# Patient Record
Sex: Male | Born: 1940 | Race: White | Hispanic: No | Marital: Married | Smoking: Former smoker
Health system: Southern US, Community
[De-identification: ages and names within clinical notes are randomized; demographics above are authoritative.]

## PROBLEM LIST (undated history)

## (undated) DIAGNOSIS — D801 Nonfamilial hypogammaglobulinemia: Secondary | ICD-10-CM

## (undated) DIAGNOSIS — Z7189 Other specified counseling: Secondary | ICD-10-CM

## (undated) DIAGNOSIS — C9000 Multiple myeloma not having achieved remission: Secondary | ICD-10-CM

## (undated) DIAGNOSIS — E109 Type 1 diabetes mellitus without complications: Secondary | ICD-10-CM

## (undated) HISTORY — DX: Multiple myeloma not having achieved remission: C90.00

## (undated) HISTORY — DX: Type 1 diabetes mellitus without complications: E10.9

---
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
## 1997-12-28 ENCOUNTER — Encounter: Admission: RE | Admit: 1997-12-28 | Discharge: 1998-03-28 | Payer: Self-pay | Admitting: Internal Medicine

## 1998-05-27 HISTORY — PX: APPENDECTOMY: SHX54

## 2004-07-10 ENCOUNTER — Ambulatory Visit: Payer: Self-pay | Admitting: Gastroenterology

## 2004-08-07 ENCOUNTER — Ambulatory Visit: Payer: Self-pay | Admitting: Gastroenterology

## 2008-06-01 ENCOUNTER — Ambulatory Visit: Payer: Self-pay | Admitting: Hematology & Oncology

## 2008-06-15 ENCOUNTER — Ambulatory Visit (HOSPITAL_BASED_OUTPATIENT_CLINIC_OR_DEPARTMENT_OTHER): Admission: RE | Admit: 2008-06-15 | Discharge: 2008-06-15 | Payer: Self-pay | Admitting: Hematology & Oncology

## 2008-06-15 ENCOUNTER — Ambulatory Visit: Payer: Self-pay | Admitting: Diagnostic Radiology

## 2008-06-23 LAB — UIFE/LIGHT CHAINS/TP QN, 24-HR UR
Alpha 2, Urine: DETECTED — AB
Free Kappa Lt Chains,Ur: 391 mg/dL — ABNORMAL HIGH (ref 0.04–1.51)
Total Protein, Urine: 395.4 mg/dL

## 2008-07-06 ENCOUNTER — Ambulatory Visit: Payer: Self-pay | Admitting: Hematology & Oncology

## 2008-07-06 ENCOUNTER — Ambulatory Visit (HOSPITAL_COMMUNITY): Admission: RE | Admit: 2008-07-06 | Discharge: 2008-07-06 | Payer: Self-pay | Admitting: Hematology & Oncology

## 2008-07-06 ENCOUNTER — Encounter: Payer: Self-pay | Admitting: Hematology & Oncology

## 2008-07-27 ENCOUNTER — Ambulatory Visit: Payer: Self-pay | Admitting: Hematology & Oncology

## 2008-07-28 LAB — BASIC METABOLIC PANEL
CO2: 24 mEq/L (ref 19–32)
Calcium: 7.9 mg/dL — ABNORMAL LOW (ref 8.4–10.5)
Chloride: 103 mEq/L (ref 96–112)
Potassium: 4.5 mEq/L (ref 3.5–5.3)
Sodium: 137 mEq/L (ref 135–145)

## 2008-07-28 LAB — CBC WITH DIFFERENTIAL (CANCER CENTER ONLY)
BASO#: 0.1 10*3/uL (ref 0.0–0.2)
Eosinophils Absolute: 0.6 10*3/uL — ABNORMAL HIGH (ref 0.0–0.5)
HCT: 33.2 % — ABNORMAL LOW (ref 38.7–49.9)
HGB: 11.2 g/dL — ABNORMAL LOW (ref 13.0–17.1)
LYMPH%: 15.4 % (ref 14.0–48.0)
MCH: 32.1 pg (ref 28.0–33.4)
MCV: 95 fL (ref 82–98)
MONO%: 12.4 % (ref 0.0–13.0)
NEUT%: 65.1 % (ref 40.0–80.0)
RBC: 3.49 10*6/uL — ABNORMAL LOW (ref 4.20–5.70)

## 2008-08-09 LAB — COMPREHENSIVE METABOLIC PANEL
ALT: 23 U/L (ref 0–53)
BUN: 20 mg/dL (ref 6–23)
CO2: 26 mEq/L (ref 19–32)
Calcium: 8.6 mg/dL (ref 8.4–10.5)
Chloride: 105 mEq/L (ref 96–112)
Creatinine, Ser: 1.19 mg/dL (ref 0.40–1.50)
Glucose, Bld: 94 mg/dL (ref 70–99)

## 2008-08-09 LAB — LACTATE DEHYDROGENASE: LDH: 139 U/L (ref 94–250)

## 2008-08-09 LAB — CBC WITH DIFFERENTIAL (CANCER CENTER ONLY)
BASO%: 0.8 % (ref 0.0–2.0)
HCT: 35.4 % — ABNORMAL LOW (ref 38.7–49.9)
LYMPH%: 26.9 % (ref 14.0–48.0)
MCH: 32.8 pg (ref 28.0–33.4)
MCV: 97 fL (ref 82–98)
MONO%: 12 % (ref 0.0–13.0)
NEUT%: 55.3 % (ref 40.0–80.0)
Platelets: 265 10*3/uL (ref 145–400)
RDW: 11.5 % (ref 10.5–14.6)

## 2008-08-16 LAB — CBC WITH DIFFERENTIAL (CANCER CENTER ONLY)
BASO%: 0.5 % (ref 0.0–2.0)
EOS%: 4.1 % (ref 0.0–7.0)
LYMPH%: 12 % — ABNORMAL LOW (ref 14.0–48.0)
MCHC: 33.6 g/dL (ref 32.0–35.9)
MCV: 97 fL (ref 82–98)
MONO#: 0.5 10*3/uL (ref 0.1–0.9)
NEUT%: 78.1 % (ref 40.0–80.0)
Platelets: 276 10*3/uL (ref 145–400)
RDW: 12.2 % (ref 10.5–14.6)
WBC: 9.2 10*3/uL (ref 4.0–10.0)

## 2008-08-23 LAB — CBC WITH DIFFERENTIAL (CANCER CENTER ONLY)
BASO%: 0.7 % (ref 0.0–2.0)
HCT: 37 % — ABNORMAL LOW (ref 38.7–49.9)
LYMPH#: 1.4 10*3/uL (ref 0.9–3.3)
MONO#: 1.4 10*3/uL — ABNORMAL HIGH (ref 0.1–0.9)
NEUT%: 71 % (ref 40.0–80.0)
Platelets: 175 10*3/uL (ref 145–400)
RDW: 11.9 % (ref 10.5–14.6)
WBC: 11.6 10*3/uL — ABNORMAL HIGH (ref 4.0–10.0)

## 2008-08-30 LAB — CBC WITH DIFFERENTIAL (CANCER CENTER ONLY)
BASO#: 0.1 10*3/uL (ref 0.0–0.2)
BASO%: 0.8 % (ref 0.0–2.0)
HCT: 36.6 % — ABNORMAL LOW (ref 38.7–49.9)
HGB: 12.2 g/dL — ABNORMAL LOW (ref 13.0–17.1)
LYMPH#: 1.3 10*3/uL (ref 0.9–3.3)
LYMPH%: 22.4 % (ref 14.0–48.0)
MCV: 97 fL (ref 82–98)
MONO#: 0.6 10*3/uL (ref 0.1–0.9)
NEUT%: 54.2 % (ref 40.0–80.0)
RDW: 12.4 % (ref 10.5–14.6)
WBC: 5.9 10*3/uL (ref 4.0–10.0)

## 2008-09-06 LAB — CBC WITH DIFFERENTIAL (CANCER CENTER ONLY)
BASO#: 0 10*3/uL (ref 0.0–0.2)
Eosinophils Absolute: 0.2 10*3/uL (ref 0.0–0.5)
HGB: 12.6 g/dL — ABNORMAL LOW (ref 13.0–17.1)
LYMPH#: 1.1 10*3/uL (ref 0.9–3.3)
MCH: 32 pg (ref 28.0–33.4)
MONO#: 0.5 10*3/uL (ref 0.1–0.9)
MONO%: 10.8 % (ref 0.0–13.0)
NEUT#: 2.6 10*3/uL (ref 1.5–6.5)
RBC: 3.93 10*6/uL — ABNORMAL LOW (ref 4.20–5.70)

## 2008-09-06 LAB — CMP (CANCER CENTER ONLY)
ALT(SGPT): 27 U/L (ref 10–47)
Albumin: 3.5 g/dL (ref 3.3–5.5)
CO2: 28 mEq/L (ref 18–33)
Chloride: 101 mEq/L (ref 98–108)
Glucose, Bld: 150 mg/dL — ABNORMAL HIGH (ref 73–118)
Potassium: 4.8 mEq/L — ABNORMAL HIGH (ref 3.3–4.7)
Sodium: 137 mEq/L (ref 128–145)
Total Protein: 5.5 g/dL — ABNORMAL LOW (ref 6.4–8.1)

## 2008-09-08 LAB — KAPPA/LAMBDA LIGHT CHAINS
Kappa free light chain: 11.9 mg/dL — ABNORMAL HIGH (ref 0.33–1.94)
Lambda Free Lght Chn: 0.64 mg/dL (ref 0.57–2.63)

## 2008-09-08 LAB — PROTEIN ELECTROPHORESIS, SERUM
Beta 2: 3.8 % (ref 3.2–6.5)
Beta Globulin: 6.3 % (ref 4.7–7.2)
Gamma Globulin: 6.1 % — ABNORMAL LOW (ref 11.1–18.8)

## 2008-09-08 LAB — BETA 2 MICROGLOBULIN, SERUM: Beta-2 Microglobulin: 1.73 mg/L (ref 1.01–1.73)

## 2008-09-12 ENCOUNTER — Ambulatory Visit: Payer: Self-pay | Admitting: Hematology & Oncology

## 2008-09-13 LAB — CBC WITH DIFFERENTIAL (CANCER CENTER ONLY)
BASO#: 0 10*3/uL (ref 0.0–0.2)
EOS%: 5.5 % (ref 0.0–7.0)
HGB: 13.2 g/dL (ref 13.0–17.1)
LYMPH#: 1.3 10*3/uL (ref 0.9–3.3)
MCHC: 33.8 g/dL (ref 32.0–35.9)
NEUT#: 4.6 10*3/uL (ref 1.5–6.5)
RBC: 4.05 10*6/uL — ABNORMAL LOW (ref 4.20–5.70)

## 2008-09-20 LAB — CBC WITH DIFFERENTIAL (CANCER CENTER ONLY)
BASO#: 0.1 10*3/uL (ref 0.0–0.2)
BASO%: 0.6 % (ref 0.0–2.0)
EOS%: 4.7 % (ref 0.0–7.0)
HCT: 36.1 % — ABNORMAL LOW (ref 38.7–49.9)
HGB: 12.4 g/dL — ABNORMAL LOW (ref 13.0–17.1)
LYMPH#: 1.1 10*3/uL (ref 0.9–3.3)
LYMPH%: 12 % — ABNORMAL LOW (ref 14.0–48.0)
MCH: 33 pg (ref 28.0–33.4)
MCHC: 34.5 g/dL (ref 32.0–35.9)
MCV: 96 fL (ref 82–98)
MONO%: 12.4 % (ref 0.0–13.0)
NEUT%: 70.3 % (ref 40.0–80.0)
RDW: 11.7 % (ref 10.5–14.6)

## 2008-10-05 LAB — CBC WITH DIFFERENTIAL (CANCER CENTER ONLY)
Eosinophils Absolute: 0.1 10*3/uL (ref 0.0–0.5)
HCT: 40.3 % (ref 38.7–49.9)
LYMPH#: 1.8 10*3/uL (ref 0.9–3.3)
LYMPH%: 28.1 % (ref 14.0–48.0)
MCV: 96 fL (ref 82–98)
MONO#: 0.7 10*3/uL (ref 0.1–0.9)
NEUT%: 58.4 % (ref 40.0–80.0)
Platelets: 230 10*3/uL (ref 145–400)
RBC: 4.21 10*6/uL (ref 4.20–5.70)
WBC: 6.3 10*3/uL (ref 4.0–10.0)

## 2008-10-05 LAB — CHCC SATELLITE - SMEAR

## 2008-10-07 LAB — COMPREHENSIVE METABOLIC PANEL
BUN: 22 mg/dL (ref 6–23)
CO2: 24 mEq/L (ref 19–32)
Calcium: 8.1 mg/dL — ABNORMAL LOW (ref 8.4–10.5)
Chloride: 106 mEq/L (ref 96–112)
Creatinine, Ser: 0.94 mg/dL (ref 0.40–1.50)
Glucose, Bld: 124 mg/dL — ABNORMAL HIGH (ref 70–99)

## 2008-10-07 LAB — LACTATE DEHYDROGENASE: LDH: 138 U/L (ref 94–250)

## 2008-10-07 LAB — PROTEIN ELECTROPHORESIS, SERUM
Alpha-2-Globulin: 11.3 % (ref 7.1–11.8)
Gamma Globulin: 5.7 % — ABNORMAL LOW (ref 11.1–18.8)

## 2008-10-07 LAB — KAPPA/LAMBDA LIGHT CHAINS
Kappa free light chain: 5.32 mg/dL — ABNORMAL HIGH (ref 0.33–1.94)
Kappa:Lambda Ratio: 6.65 — ABNORMAL HIGH (ref 0.26–1.65)
Lambda Free Lght Chn: 0.8 mg/dL (ref 0.57–2.63)

## 2008-11-21 ENCOUNTER — Ambulatory Visit: Payer: Self-pay | Admitting: Hematology & Oncology

## 2008-11-22 ENCOUNTER — Encounter: Payer: Self-pay | Admitting: Hematology & Oncology

## 2008-11-22 ENCOUNTER — Ambulatory Visit (HOSPITAL_COMMUNITY): Admission: RE | Admit: 2008-11-22 | Discharge: 2008-11-22 | Payer: Self-pay | Admitting: Hematology & Oncology

## 2008-11-30 ENCOUNTER — Ambulatory Visit: Payer: Self-pay | Admitting: Hematology & Oncology

## 2008-12-01 LAB — CBC WITH DIFFERENTIAL (CANCER CENTER ONLY)
BASO%: 0.6 % (ref 0.0–2.0)
EOS%: 1.7 % (ref 0.0–7.0)
Eosinophils Absolute: 0.1 10*3/uL (ref 0.0–0.5)
MCH: 31.7 pg (ref 28.0–33.4)
MCHC: 33 g/dL (ref 32.0–35.9)
MONO%: 9.7 % (ref 0.0–13.0)
NEUT#: 2.1 10*3/uL (ref 1.5–6.5)
Platelets: 224 10*3/uL (ref 145–400)
RBC: 4.53 10*6/uL (ref 4.20–5.70)
RDW: 13.5 % (ref 10.5–14.6)

## 2008-12-05 LAB — SPEP & IFE WITH QIG
Albumin ELP: 65.6 % (ref 55.8–66.1)
Alpha-1-Globulin: 7.1 % — ABNORMAL HIGH (ref 2.9–4.9)
Alpha-2-Globulin: 11.1 % (ref 7.1–11.8)
IgM, Serum: 15 mg/dL — ABNORMAL LOW (ref 60–263)

## 2008-12-05 LAB — COMPREHENSIVE METABOLIC PANEL
AST: 15 U/L (ref 0–37)
Albumin: 4.1 g/dL (ref 3.5–5.2)
Alkaline Phosphatase: 66 U/L (ref 39–117)
Potassium: 4.2 mEq/L (ref 3.5–5.3)
Sodium: 140 mEq/L (ref 135–145)
Total Protein: 5.9 g/dL — ABNORMAL LOW (ref 6.0–8.3)

## 2008-12-08 LAB — BASIC METABOLIC PANEL - CANCER CENTER ONLY
CO2: 31 mEq/L (ref 18–33)
Chloride: 103 mEq/L (ref 98–108)
Glucose, Bld: 119 mg/dL — ABNORMAL HIGH (ref 73–118)
Sodium: 143 mEq/L (ref 128–145)

## 2008-12-25 HISTORY — PX: LIMBAL STEM CELL TRANSPLANT: SHX1969

## 2009-01-04 ENCOUNTER — Ambulatory Visit: Payer: Self-pay | Admitting: Hematology & Oncology

## 2009-01-16 LAB — CBC WITH DIFFERENTIAL (CANCER CENTER ONLY)
BASO#: 0 10e3/uL (ref 0.0–0.2)
BASO%: 0.8 % (ref 0.0–2.0)
EOS%: 0.8 % (ref 0.0–7.0)
Eosinophils Absolute: 0 10e3/uL (ref 0.0–0.5)
HCT: 35.4 % — ABNORMAL LOW (ref 38.7–49.9)
HGB: 12.1 g/dL — ABNORMAL LOW (ref 13.0–17.1)
LYMPH#: 1.2 10e3/uL (ref 0.9–3.3)
LYMPH%: 23.6 % (ref 14.0–48.0)
MCH: 32.9 pg (ref 28.0–33.4)
MCHC: 34.2 g/dL (ref 32.0–35.9)
MCV: 96 fL (ref 82–98)
MONO#: 1 10e3/uL — ABNORMAL HIGH (ref 0.1–0.9)
MONO%: 18.9 % — ABNORMAL HIGH (ref 0.0–13.0)
NEUT#: 2.9 10e3/uL (ref 1.5–6.5)
NEUT%: 55.9 % (ref 40.0–80.0)
Platelets: 216 10e3/uL (ref 145–400)
RBC: 3.67 10e6/uL — ABNORMAL LOW (ref 4.20–5.70)
RDW: 13.7 % (ref 10.5–14.6)
WBC: 5.2 10e3/uL (ref 4.0–10.0)

## 2009-01-18 LAB — PROTEIN ELECTROPHORESIS, SERUM
Albumin ELP: 59.3 % (ref 55.8–66.1)
Alpha-1-Globulin: 10.7 % — ABNORMAL HIGH (ref 2.9–4.9)
Alpha-2-Globulin: 13.2 % — ABNORMAL HIGH (ref 7.1–11.8)
Beta 2: 3.8 % (ref 3.2–6.5)
Beta Globulin: 6.1 % (ref 4.7–7.2)
Gamma Globulin: 6.9 % — ABNORMAL LOW (ref 11.1–18.8)

## 2009-01-18 LAB — CMP AND LIVER
Albumin: 3.8 g/dL (ref 3.5–5.2)
BUN: 15 mg/dL (ref 6–23)
CO2: 25 mEq/L (ref 19–32)
Calcium: 8.5 mg/dL (ref 8.4–10.5)
Chloride: 104 mEq/L (ref 96–112)
Glucose, Bld: 105 mg/dL — ABNORMAL HIGH (ref 70–99)
Indirect Bilirubin: 0.4 mg/dL (ref 0.0–0.9)
Potassium: 4.3 mEq/L (ref 3.5–5.3)
Sodium: 138 mEq/L (ref 135–145)
Total Protein: 5.7 g/dL — ABNORMAL LOW (ref 6.0–8.3)

## 2009-01-18 LAB — KAPPA/LAMBDA LIGHT CHAINS: Lambda Free Lght Chn: 0.79 mg/dL (ref 0.57–2.63)

## 2009-01-23 LAB — CBC WITH DIFFERENTIAL (CANCER CENTER ONLY)
BASO%: 0.8 % (ref 0.0–2.0)
Eosinophils Absolute: 0.1 10*3/uL (ref 0.0–0.5)
MONO#: 0.6 10*3/uL (ref 0.1–0.9)
NEUT#: 2.7 10*3/uL (ref 1.5–6.5)
Platelets: 282 10*3/uL (ref 145–400)
RBC: 3.99 10*6/uL — ABNORMAL LOW (ref 4.20–5.70)
WBC: 4.6 10*3/uL (ref 4.0–10.0)

## 2009-01-23 LAB — CMP AND LIVER
BUN: 18 mg/dL (ref 6–23)
Bilirubin, Direct: 0.1 mg/dL (ref 0.0–0.3)
CO2: 24 mEq/L (ref 19–32)
Calcium: 8.6 mg/dL (ref 8.4–10.5)
Chloride: 105 mEq/L (ref 96–112)
Creatinine, Ser: 0.94 mg/dL (ref 0.40–1.50)
Glucose, Bld: 186 mg/dL — ABNORMAL HIGH (ref 70–99)
Indirect Bilirubin: 0.3 mg/dL (ref 0.0–0.9)

## 2009-01-23 LAB — PHOSPHORUS: Phosphorus: 3.2 mg/dL (ref 2.3–4.6)

## 2009-01-31 LAB — CBC WITH DIFFERENTIAL (CANCER CENTER ONLY)
BASO%: 1.3 % (ref 0.0–2.0)
EOS%: 4 % (ref 0.0–7.0)
HCT: 44.7 % (ref 38.7–49.9)
LYMPH#: 1.4 10*3/uL (ref 0.9–3.3)
LYMPH%: 18.4 % (ref 14.0–48.0)
MCHC: 33.5 g/dL (ref 32.0–35.9)
MCV: 100 fL — ABNORMAL HIGH (ref 82–98)
NEUT%: 66.6 % (ref 40.0–80.0)
Platelets: 179 10*3/uL (ref 145–400)
RDW: 14.4 % (ref 10.5–14.6)

## 2009-01-31 LAB — COMPREHENSIVE METABOLIC PANEL
Albumin: 3.9 g/dL (ref 3.5–5.2)
Alkaline Phosphatase: 63 U/L (ref 39–117)
BUN: 18 mg/dL (ref 6–23)
Glucose, Bld: 175 mg/dL — ABNORMAL HIGH (ref 70–99)
Total Bilirubin: 0.6 mg/dL (ref 0.3–1.2)

## 2009-02-13 ENCOUNTER — Ambulatory Visit: Payer: Self-pay | Admitting: Hematology & Oncology

## 2009-02-14 LAB — CBC WITH DIFFERENTIAL (CANCER CENTER ONLY)
BASO%: 0.5 % (ref 0.0–2.0)
EOS%: 3.7 % (ref 0.0–7.0)
LYMPH#: 1.3 10*3/uL (ref 0.9–3.3)
MCHC: 33.9 g/dL (ref 32.0–35.9)
MONO#: 0.5 10*3/uL (ref 0.1–0.9)
NEUT#: 3.4 10*3/uL (ref 1.5–6.5)
RDW: 13.1 % (ref 10.5–14.6)
WBC: 5.4 10*3/uL (ref 4.0–10.0)

## 2009-02-14 LAB — COMPREHENSIVE METABOLIC PANEL
AST: 19 U/L (ref 0–37)
Albumin: 3.9 g/dL (ref 3.5–5.2)
BUN: 11 mg/dL (ref 6–23)
Calcium: 8.8 mg/dL (ref 8.4–10.5)
Chloride: 109 mEq/L (ref 96–112)
Glucose, Bld: 87 mg/dL (ref 70–99)
Potassium: 4.2 mEq/L (ref 3.5–5.3)

## 2009-02-28 LAB — CBC WITH DIFFERENTIAL (CANCER CENTER ONLY)
BASO#: 0.1 10*3/uL (ref 0.0–0.2)
Eosinophils Absolute: 0 10*3/uL (ref 0.0–0.5)
HCT: 46.1 % (ref 38.7–49.9)
HGB: 15.7 g/dL (ref 13.0–17.1)
LYMPH%: 31 % (ref 14.0–48.0)
MCH: 33.3 pg (ref 28.0–33.4)
MCV: 98 fL (ref 82–98)
MONO#: 0.4 10*3/uL (ref 0.1–0.9)
MONO%: 10.7 % (ref 0.0–13.0)
NEUT%: 55.8 % (ref 40.0–80.0)
Platelets: 152 10*3/uL (ref 145–400)
RBC: 4.71 10*6/uL (ref 4.20–5.70)
WBC: 3.8 10*3/uL — ABNORMAL LOW (ref 4.0–10.0)

## 2009-03-02 LAB — COMPREHENSIVE METABOLIC PANEL
ALT: 19 U/L (ref 0–53)
Albumin: 4 g/dL (ref 3.5–5.2)
CO2: 28 mEq/L (ref 19–32)
Calcium: 8.7 mg/dL (ref 8.4–10.5)
Chloride: 107 mEq/L (ref 96–112)
Glucose, Bld: 183 mg/dL — ABNORMAL HIGH (ref 70–99)
Potassium: 4.5 mEq/L (ref 3.5–5.3)
Sodium: 142 mEq/L (ref 135–145)
Total Protein: 5.9 g/dL — ABNORMAL LOW (ref 6.0–8.3)

## 2009-03-02 LAB — KAPPA/LAMBDA LIGHT CHAINS
Kappa free light chain: <0.57 mg/dL (ref 0.33–1.94)
Lambda Free Lght Chn: <0.47 mg/dL — ABNORMAL LOW (ref 0.57–2.63)

## 2009-03-02 LAB — PROTEIN ELECTROPHORESIS, SERUM
Alpha-2-Globulin: 10.2 % (ref 7.1–11.8)
Beta 2: 4.7 % (ref 3.2–6.5)
Beta Globulin: 6.6 % (ref 4.7–7.2)
Gamma Globulin: 9 % — ABNORMAL LOW (ref 11.1–18.8)
Total Protein, Serum Electrophoresis: 5.9 g/dL — ABNORMAL LOW (ref 6.0–8.3)

## 2009-03-02 LAB — IGG, IGA, IGM
IgA: 51 mg/dL — ABNORMAL LOW (ref 68–378)
IgG (Immunoglobin G), Serum: 552 mg/dL — ABNORMAL LOW (ref 694–1618)

## 2009-03-14 LAB — CBC WITH DIFFERENTIAL (CANCER CENTER ONLY)
BASO%: 1 % (ref 0.0–2.0)
HCT: 45.4 % (ref 38.7–49.9)
LYMPH%: 17.8 % (ref 14.0–48.0)
MCH: 33.5 pg — ABNORMAL HIGH (ref 28.0–33.4)
MCV: 97 fL (ref 82–98)
MONO#: 0.6 10*3/uL (ref 0.1–0.9)
MONO%: 8.8 % (ref 0.0–13.0)
NEUT%: 70.1 % (ref 40.0–80.0)
Platelets: 179 10*3/uL (ref 145–400)
RDW: 11.6 % (ref 10.5–14.6)
WBC: 7.2 10*3/uL (ref 4.0–10.0)

## 2009-03-14 LAB — COMPREHENSIVE METABOLIC PANEL
AST: 14 U/L (ref 0–37)
Albumin: 3.9 g/dL (ref 3.5–5.2)
Alkaline Phosphatase: 69 U/L (ref 39–117)
Potassium: 4 mEq/L (ref 3.5–5.3)
Sodium: 137 mEq/L (ref 135–145)
Total Protein: 6 g/dL (ref 6.0–8.3)

## 2009-04-07 ENCOUNTER — Ambulatory Visit: Payer: Self-pay | Admitting: Hematology & Oncology

## 2009-04-10 LAB — CBC WITH DIFFERENTIAL (CANCER CENTER ONLY)
BASO#: 0.1 10*3/uL (ref 0.0–0.2)
Eosinophils Absolute: 0.4 10*3/uL (ref 0.0–0.5)
HGB: 15 g/dL (ref 13.0–17.1)
MCH: 33.1 pg (ref 28.0–33.4)
MONO#: 0.6 10*3/uL (ref 0.1–0.9)
MONO%: 10.5 % (ref 0.0–13.0)
NEUT#: 3.4 10*3/uL (ref 1.5–6.5)
Platelets: 181 10*3/uL (ref 145–400)
RBC: 4.52 10*6/uL (ref 4.20–5.70)
WBC: 5.5 10*3/uL (ref 4.0–10.0)

## 2009-04-11 ENCOUNTER — Encounter: Payer: Self-pay | Admitting: Hematology & Oncology

## 2009-04-11 ENCOUNTER — Ambulatory Visit: Payer: Self-pay | Admitting: Hematology & Oncology

## 2009-04-11 ENCOUNTER — Ambulatory Visit (HOSPITAL_COMMUNITY): Admission: RE | Admit: 2009-04-11 | Discharge: 2009-04-11 | Payer: Self-pay | Admitting: Hematology & Oncology

## 2009-04-12 LAB — COMPREHENSIVE METABOLIC PANEL
Albumin: 3.8 g/dL (ref 3.5–5.2)
Alkaline Phosphatase: 69 U/L (ref 39–117)
BUN: 16 mg/dL (ref 6–23)
CO2: 26 mEq/L (ref 19–32)
Glucose, Bld: 141 mg/dL — ABNORMAL HIGH (ref 70–99)
Potassium: 4.2 mEq/L (ref 3.5–5.3)
Sodium: 139 mEq/L (ref 135–145)
Total Bilirubin: 0.7 mg/dL (ref 0.3–1.2)
Total Protein: 5.8 g/dL — ABNORMAL LOW (ref 6.0–8.3)

## 2009-04-12 LAB — SPEP & IFE WITH QIG
Albumin ELP: 59.8 % (ref 55.8–66.1)
Alpha-1-Globulin: 6.4 % — ABNORMAL HIGH (ref 2.9–4.9)
Alpha-2-Globulin: 11.1 % (ref 7.1–11.8)
IgM, Serum: 28 mg/dL — ABNORMAL LOW (ref 60–263)

## 2009-04-12 LAB — KAPPA/LAMBDA LIGHT CHAINS
Kappa free light chain: 1.43 mg/dL (ref 0.33–1.94)
Kappa:Lambda Ratio: 1.27 (ref 0.26–1.65)
Lambda Free Lght Chn: 1.13 mg/dL (ref 0.57–2.63)

## 2009-04-12 LAB — BETA 2 MICROGLOBULIN, SERUM: Beta-2 Microglobulin: 2.04 mg/L — ABNORMAL HIGH (ref 1.01–1.73)

## 2009-06-06 ENCOUNTER — Ambulatory Visit: Payer: Self-pay | Admitting: Hematology & Oncology

## 2009-06-08 LAB — CBC WITH DIFFERENTIAL (CANCER CENTER ONLY)
BASO#: 0 10*3/uL (ref 0.0–0.2)
BASO%: 0.7 % (ref 0.0–2.0)
EOS%: 4.8 % (ref 0.0–7.0)
Eosinophils Absolute: 0.2 10*3/uL (ref 0.0–0.5)
HCT: 44.3 % (ref 38.7–49.9)
HGB: 15 g/dL (ref 13.0–17.1)
LYMPH#: 1 10*3/uL (ref 0.9–3.3)
LYMPH%: 22.7 % (ref 14.0–48.0)
MCH: 32.7 pg (ref 28.0–33.4)
MCHC: 33.8 g/dL (ref 32.0–35.9)
MCV: 97 fL (ref 82–98)
MONO#: 0.4 10*3/uL (ref 0.1–0.9)
MONO%: 8.5 % (ref 0.0–13.0)
NEUT#: 2.7 10*3/uL (ref 1.5–6.5)
NEUT%: 63.3 % (ref 40.0–80.0)
Platelets: 292 10*3/uL (ref 145–400)
RBC: 4.58 10*6/uL (ref 4.20–5.70)
RDW: 12.7 % (ref 10.5–14.6)
WBC: 4.2 10*3/uL (ref 4.0–10.0)

## 2009-06-13 LAB — SPEP & IFE WITH QIG
Albumin ELP: 54 % — ABNORMAL LOW (ref 55.8–66.1)
Alpha-1-Globulin: 6.4 % — ABNORMAL HIGH (ref 2.9–4.9)
IgA: 207 mg/dL (ref 68–378)
IgM, Serum: 34 mg/dL — ABNORMAL LOW (ref 60–263)
Total Protein, Serum Electrophoresis: 6 g/dL (ref 6.0–8.3)

## 2009-06-13 LAB — COMPREHENSIVE METABOLIC PANEL
ALT: 27 U/L (ref 0–53)
AST: 21 U/L (ref 0–37)
Albumin: 3.6 g/dL (ref 3.5–5.2)
Alkaline Phosphatase: 65 U/L (ref 39–117)
BUN: 15 mg/dL (ref 6–23)
CO2: 27 mEq/L (ref 19–32)
Calcium: 8.5 mg/dL (ref 8.4–10.5)
Chloride: 102 mEq/L (ref 96–112)
Creatinine, Ser: 0.94 mg/dL (ref 0.40–1.50)
Glucose, Bld: 135 mg/dL — ABNORMAL HIGH (ref 70–99)
Potassium: 4 mEq/L (ref 3.5–5.3)
Sodium: 138 mEq/L (ref 135–145)
Total Bilirubin: 0.5 mg/dL (ref 0.3–1.2)
Total Protein: 6 g/dL (ref 6.0–8.3)

## 2009-06-13 LAB — KAPPA/LAMBDA LIGHT CHAINS: Kappa:Lambda Ratio: 1.51 (ref 0.26–1.65)

## 2009-08-29 ENCOUNTER — Ambulatory Visit: Payer: Self-pay | Admitting: Hematology & Oncology

## 2009-08-31 LAB — CBC WITH DIFFERENTIAL (CANCER CENTER ONLY)
BASO#: 0.1 10*3/uL (ref 0.0–0.2)
Eosinophils Absolute: 0.1 10*3/uL (ref 0.0–0.5)
HCT: 50 % — ABNORMAL HIGH (ref 38.7–49.9)
HGB: 16.7 g/dL (ref 13.0–17.1)
LYMPH%: 28.6 % (ref 14.0–48.0)
MCH: 34.3 pg — ABNORMAL HIGH (ref 28.0–33.4)
MCV: 103 fL — ABNORMAL HIGH (ref 82–98)
MONO%: 7.5 % (ref 0.0–13.0)
NEUT#: 3.1 10*3/uL (ref 1.5–6.5)
NEUT%: 59.1 % (ref 40.0–80.0)
RBC: 4.86 10*6/uL (ref 4.20–5.70)

## 2009-09-04 LAB — PROTEIN ELECTROPHORESIS, SERUM
Albumin ELP: 59.7 % (ref 55.8–66.1)
Beta 2: 4.2 % (ref 3.2–6.5)
Gamma Globulin: 13.5 % (ref 11.1–18.8)

## 2009-09-04 LAB — COMPREHENSIVE METABOLIC PANEL
Albumin: 4 g/dL (ref 3.5–5.2)
Alkaline Phosphatase: 61 U/L (ref 39–117)
BUN: 19 mg/dL (ref 6–23)
Creatinine, Ser: 0.99 mg/dL (ref 0.40–1.50)
Glucose, Bld: 117 mg/dL — ABNORMAL HIGH (ref 70–99)
Potassium: 4.2 mEq/L (ref 3.5–5.3)

## 2009-09-04 LAB — KAPPA/LAMBDA LIGHT CHAINS
Kappa:Lambda Ratio: 0.97 (ref 0.26–1.65)
Lambda Free Lght Chn: 1.32 mg/dL (ref 0.57–2.63)

## 2009-12-01 ENCOUNTER — Ambulatory Visit: Payer: Self-pay | Admitting: Hematology & Oncology

## 2009-12-04 LAB — CBC WITH DIFFERENTIAL (CANCER CENTER ONLY)
BASO%: 0.7 % (ref 0.0–2.0)
EOS%: 5.1 % (ref 0.0–7.0)
HCT: 46.4 % (ref 38.7–49.9)
LYMPH%: 29.1 % (ref 14.0–48.0)
MCH: 35.4 pg — ABNORMAL HIGH (ref 28.0–33.4)
MCHC: 34.2 g/dL (ref 32.0–35.9)
MCV: 104 fL — ABNORMAL HIGH (ref 82–98)
MONO%: 11.8 % (ref 0.0–13.0)
NEUT%: 53.3 % (ref 40.0–80.0)
Platelets: 148 10*3/uL (ref 145–400)
RDW: 11.4 % (ref 10.5–14.6)

## 2009-12-06 LAB — LACTATE DEHYDROGENASE: LDH: 131 U/L (ref 94–250)

## 2009-12-06 LAB — SPEP & IFE WITH QIG
Alpha-1-Globulin: 5.5 % — ABNORMAL HIGH (ref 2.9–4.9)
Alpha-2-Globulin: 10.3 % (ref 7.1–11.8)
Beta 2: 5.1 % (ref 3.2–6.5)
Beta Globulin: 5.7 % (ref 4.7–7.2)
Gamma Globulin: 14.6 % (ref 11.1–18.8)

## 2009-12-06 LAB — COMPREHENSIVE METABOLIC PANEL
BUN: 20 mg/dL (ref 6–23)
CO2: 25 mEq/L (ref 19–32)
Glucose, Bld: 149 mg/dL — ABNORMAL HIGH (ref 70–99)
Sodium: 137 mEq/L (ref 135–145)
Total Bilirubin: 1 mg/dL (ref 0.3–1.2)
Total Protein: 6.4 g/dL (ref 6.0–8.3)

## 2010-03-15 ENCOUNTER — Ambulatory Visit: Payer: Self-pay | Admitting: Hematology & Oncology

## 2010-03-16 LAB — CBC WITH DIFFERENTIAL (CANCER CENTER ONLY)
BASO#: 0 10*3/uL (ref 0.0–0.2)
EOS%: 2.5 % (ref 0.0–7.0)
HCT: 46 % (ref 38.7–49.9)
HGB: 15.4 g/dL (ref 13.0–17.1)
LYMPH#: 1.1 10*3/uL (ref 0.9–3.3)
MCHC: 33.5 g/dL (ref 32.0–35.9)
MONO#: 0.4 10*3/uL (ref 0.1–0.9)
NEUT#: 1.9 10*3/uL (ref 1.5–6.5)
NEUT%: 53.3 % (ref 40.0–80.0)
RBC: 4.55 10*6/uL (ref 4.20–5.70)
WBC: 3.6 10*3/uL — ABNORMAL LOW (ref 4.0–10.0)

## 2010-03-20 LAB — COMPREHENSIVE METABOLIC PANEL
ALT: 18 U/L (ref 0–53)
AST: 19 U/L (ref 0–37)
Albumin: 3.7 g/dL (ref 3.5–5.2)
BUN: 9 mg/dL (ref 6–23)
CO2: 27 mEq/L (ref 19–32)
Calcium: 8.4 mg/dL (ref 8.4–10.5)
Chloride: 105 mEq/L (ref 96–112)
Creatinine, Ser: 0.84 mg/dL (ref 0.40–1.50)
Potassium: 3.9 mEq/L (ref 3.5–5.3)

## 2010-03-20 LAB — PROTEIN ELECTROPHORESIS, SERUM
Alpha-1-Globulin: 5.6 % — ABNORMAL HIGH (ref 2.9–4.9)
Alpha-2-Globulin: 9.6 % (ref 7.1–11.8)
Beta 2: 4.5 % (ref 3.2–6.5)
Gamma Globulin: 16.6 % (ref 11.1–18.8)

## 2010-03-20 LAB — KAPPA/LAMBDA LIGHT CHAINS: Kappa free light chain: 1.38 mg/dL (ref 0.33–1.94)

## 2010-06-13 ENCOUNTER — Ambulatory Visit: Payer: Self-pay | Admitting: Hematology & Oncology

## 2010-06-14 LAB — CBC WITH DIFFERENTIAL (CANCER CENTER ONLY)
BASO#: 0 10*3/uL (ref 0.0–0.2)
BASO%: 0.8 % (ref 0.0–2.0)
EOS%: 4.4 % (ref 0.0–7.0)
Eosinophils Absolute: 0.2 10*3/uL (ref 0.0–0.5)
HCT: 46 % (ref 38.7–49.9)
HGB: 15.4 g/dL (ref 13.0–17.1)
LYMPH#: 1.3 10*3/uL (ref 0.9–3.3)
LYMPH%: 28.8 % (ref 14.0–48.0)
MCH: 33.6 pg — ABNORMAL HIGH (ref 28.0–33.4)
MCHC: 33.4 g/dL (ref 32.0–35.9)
MCV: 101 fL — ABNORMAL HIGH (ref 82–98)
MONO#: 0.6 10*3/uL (ref 0.1–0.9)
MONO%: 13.3 % — ABNORMAL HIGH (ref 0.0–13.0)
NEUT#: 2.4 10*3/uL (ref 1.5–6.5)
NEUT%: 52.7 % (ref 40.0–80.0)
Platelets: 240 10*3/uL (ref 145–400)
RBC: 4.58 10*6/uL (ref 4.20–5.70)
RDW: 10.7 % (ref 10.5–14.6)
WBC: 4.5 10*3/uL (ref 4.0–10.0)

## 2010-06-14 LAB — CHCC SATELLITE - SMEAR

## 2010-06-18 LAB — SPEP & IFE WITH QIG
Albumin ELP: 54.3 % — ABNORMAL LOW (ref 55.8–66.1)
Alpha-1-Globulin: 6.4 % — ABNORMAL HIGH (ref 2.9–4.9)
Alpha-2-Globulin: 12.2 % — ABNORMAL HIGH (ref 7.1–11.8)
Beta 2: 5.8 % (ref 3.2–6.5)
Beta Globulin: 5.9 % (ref 4.7–7.2)
Gamma Globulin: 15.4 % (ref 11.1–18.8)
IgA: 184 mg/dL (ref 68–378)
IgG (Immunoglobin G), Serum: 1020 mg/dL (ref 694–1618)
IgM, Serum: 27 mg/dL — ABNORMAL LOW (ref 60–263)
Total Protein, Serum Electrophoresis: 6 g/dL (ref 6.0–8.3)

## 2010-06-18 LAB — COMPREHENSIVE METABOLIC PANEL
ALT: 21 U/L (ref 0–53)
AST: 16 U/L (ref 0–37)
Albumin: 3.7 g/dL (ref 3.5–5.2)
Alkaline Phosphatase: 69 U/L (ref 39–117)
BUN: 15 mg/dL (ref 6–23)
CO2: 28 mEq/L (ref 19–32)
Calcium: 8.5 mg/dL (ref 8.4–10.5)
Chloride: 98 mEq/L (ref 96–112)
Creatinine, Ser: 0.9 mg/dL (ref 0.40–1.50)
Glucose, Bld: 131 mg/dL — ABNORMAL HIGH (ref 70–99)
Potassium: 3.9 mEq/L (ref 3.5–5.3)
Sodium: 135 mEq/L (ref 135–145)
Total Bilirubin: 1 mg/dL (ref 0.3–1.2)
Total Protein: 6 g/dL (ref 6.0–8.3)

## 2010-06-18 LAB — BETA 2 MICROGLOBULIN, SERUM: Beta-2 Microglobulin: 1.93 mg/L — ABNORMAL HIGH (ref 1.01–1.73)

## 2010-06-18 LAB — KAPPA/LAMBDA LIGHT CHAINS
Kappa free light chain: 1.51 mg/dL (ref 0.33–1.94)
Kappa:Lambda Ratio: 1.59 (ref 0.26–1.65)
Lambda Free Lght Chn: 0.95 mg/dL (ref 0.57–2.63)

## 2010-06-18 LAB — LACTATE DEHYDROGENASE: LDH: 112 U/L (ref 94–250)

## 2010-07-25 ENCOUNTER — Other Ambulatory Visit: Payer: Self-pay | Admitting: Hematology & Oncology

## 2010-07-25 ENCOUNTER — Encounter (HOSPITAL_BASED_OUTPATIENT_CLINIC_OR_DEPARTMENT_OTHER): Payer: Medicare Other | Admitting: Hematology & Oncology

## 2010-07-25 DIAGNOSIS — C9000 Multiple myeloma not having achieved remission: Secondary | ICD-10-CM

## 2010-07-25 DIAGNOSIS — Z9484 Stem cells transplant status: Secondary | ICD-10-CM

## 2010-07-25 DIAGNOSIS — Z23 Encounter for immunization: Secondary | ICD-10-CM

## 2010-07-25 LAB — CBC WITH DIFFERENTIAL (CANCER CENTER ONLY)
BASO#: 0 10*3/uL (ref 0.0–0.2)
EOS%: 4 % (ref 0.0–7.0)
HCT: 44.9 % (ref 38.7–49.9)
HGB: 15.1 g/dL (ref 13.0–17.1)
MCH: 34.5 pg — ABNORMAL HIGH (ref 28.0–33.4)
MCHC: 33.7 g/dL (ref 32.0–35.9)
MONO%: 8.6 % (ref 0.0–13.0)
NEUT%: 44.9 % (ref 40.0–80.0)

## 2010-07-26 LAB — COMPREHENSIVE METABOLIC PANEL
Albumin: 3.6 g/dL (ref 3.5–5.2)
BUN: 14 mg/dL (ref 6–23)
CO2: 29 mEq/L (ref 19–32)
Calcium: 9.2 mg/dL (ref 8.4–10.5)
Chloride: 105 mEq/L (ref 96–112)
Glucose, Bld: 135 mg/dL — ABNORMAL HIGH (ref 70–99)
Potassium: 4 mEq/L (ref 3.5–5.3)
Sodium: 140 mEq/L (ref 135–145)
Total Protein: 5.9 g/dL — ABNORMAL LOW (ref 6.0–8.3)

## 2010-07-26 LAB — KAPPA/LAMBDA LIGHT CHAINS
Kappa free light chain: 1.45 mg/dL (ref 0.33–1.94)
Lambda Free Lght Chn: 1.41 mg/dL (ref 0.57–2.63)

## 2010-08-29 LAB — DIFFERENTIAL
Basophils Absolute: 0.1 10*3/uL (ref 0.0–0.1)
Basophils Relative: 1 % (ref 0–1)
Monocytes Absolute: 0.8 10*3/uL (ref 0.1–1.0)
Neutro Abs: 1.9 10*3/uL (ref 1.7–7.7)
Neutrophils Relative %: 46 % (ref 43–77)

## 2010-08-29 LAB — CBC
Hemoglobin: 13.6 g/dL (ref 13.0–17.0)
MCHC: 33.3 g/dL (ref 30.0–36.0)
Platelets: 155 10*3/uL (ref 150–400)
RDW: 14.9 % (ref 11.5–15.5)

## 2010-09-03 LAB — DIFFERENTIAL
Basophils Relative: 1 % (ref 0–1)
Eosinophils Absolute: 0.2 10*3/uL (ref 0.0–0.7)
Lymphs Abs: 1 10*3/uL (ref 0.7–4.0)
Monocytes Absolute: 0.5 10*3/uL (ref 0.1–1.0)
Monocytes Relative: 12 % (ref 3–12)
Neutro Abs: 2.7 10*3/uL (ref 1.7–7.7)

## 2010-09-03 LAB — CBC
Hemoglobin: 12.8 g/dL — ABNORMAL LOW (ref 13.0–17.0)
MCHC: 33.8 g/dL (ref 30.0–36.0)
MCV: 96.5 fL (ref 78.0–100.0)
RBC: 3.91 MIL/uL — ABNORMAL LOW (ref 4.22–5.81)
WBC: 4.4 10*3/uL (ref 4.0–10.5)

## 2010-09-03 LAB — CHROMOSOME ANALYSIS, BONE MARROW

## 2010-09-11 LAB — CBC
HCT: 37.5 % — ABNORMAL LOW (ref 39.0–52.0)
Hemoglobin: 12.8 g/dL — ABNORMAL LOW (ref 13.0–17.0)
RBC: 3.86 MIL/uL — ABNORMAL LOW (ref 4.22–5.81)
RDW: 12.9 % (ref 11.5–15.5)

## 2010-09-11 LAB — TISSUE HYBRIDIZATION (BONE MARROW)-NCBH

## 2010-09-11 LAB — DIFFERENTIAL
Basophils Absolute: 0 10*3/uL (ref 0.0–0.1)
Eosinophils Relative: 5 % (ref 0–5)
Lymphocytes Relative: 17 % (ref 12–46)
Monocytes Absolute: 0.8 10*3/uL (ref 0.1–1.0)
Monocytes Relative: 9 % (ref 3–12)
Neutro Abs: 6.4 10*3/uL (ref 1.7–7.7)

## 2010-09-11 LAB — GLUCOSE, CAPILLARY: Glucose-Capillary: 128 mg/dL — ABNORMAL HIGH (ref 70–99)

## 2010-10-09 NOTE — Op Note (Signed)
NAME:  Rickey [name], Rickey [name] NO.:  [id number]   MEDICAL RECORD NO.:  [id number]          PATIENT TYPE:  AMB   LOCATION:  OMED                         FACILITY:  [facility]   PHYSICIAN:  Rose Phi. Myna Hidalgo, M.D. DATE OF BIRTH:  April 19, 1941   DATE OF PROCEDURE:  07/06/2008  DATE OF DISCHARGE:                               OPERATIVE REPORT   PROCEDURE PERFORMED:  Left posterior iliac crest bone marrow biopsy and  aspirate.   [name] was brought to the short-stay unit.  He had an IV placed  peripherally without any difficulty.   His Mallampati score was 1.  ASA class was 1.   We gave him a total of 4 mg of Versed and 25 mg Demerol for IV sedation.   The left posterior iliac crest region was prepped and draped in a  sterile fashion.  Eight mL of 2% lidocaine was infiltrated under the  skin down to the periosteum.   A #11 scalpel was used to make an incision into the skin.   Two bone marrow aspirates were obtained without difficulty.   We then obtained a bone marrow biopsy core with a Jamshidi biopsy  needle.   [name] tolerated the procedure well.  There were no complications.      Rose Phi. Myna Hidalgo, M.D.  Electronically Signed     PRE/MEDQ  D:  07/06/2008  T:  07/06/2008  Job:  [id number]

## 2010-10-09 NOTE — Op Note (Signed)
NAME:  Rickey [name], Rickey [name] NO.:  [id number]   MEDICAL RECORD NO.:  [id number]          PATIENT TYPE:  AMB   LOCATION:  OMED                         FACILITY:  [facility]   PHYSICIAN:  Rose Phi. Myna Hidalgo, M.D. DATE OF BIRTH:  1940/09/13   DATE OF PROCEDURE:  11/22/2008  DATE OF DISCHARGE:                               OPERATIVE REPORT   PROCEDURE:  Left posterior iliac crest bone marrow biopsy and aspirate.   Mr. Spagnolo was brought to short stay unit.  He was subsequently placed  onto his right side.  He had an IV placed into his left hand without any  difficulty.   He received a total of 2.5 mg of Versed and 25 mg of Demerol for IV  sedation.   The left posterior iliac crest region was prepped and draped in a  sterile fashion.  Unfortunately, the Betadine preps were drawn and we  had to wait to get new ones in which took an extra 5 minutes.  Once we  got the new Betadine preps in, the left posterior iliac crest region was  prepped and draped in sterile fashion.   We injected 10 mL of 2% lidocaine under the skin down to the periosteum.   A #11 scalpel was used to make incision into the skin.  We then obtained  two bone marrow aspirates without any difficulty.   We then used the Jamshidi biopsy needle.  An excellent biopsy core was  obtained without difficulty.   The patient tolerated the procedure well.  There were no complications.      Rose Phi. Myna Hidalgo, M.D.  Electronically Signed     PRE/MEDQ  D:  11/22/2008  T:  11/22/2008  Job:  [id number]

## 2010-10-11 ENCOUNTER — Other Ambulatory Visit: Payer: Self-pay | Admitting: Hematology & Oncology

## 2010-10-11 ENCOUNTER — Encounter (HOSPITAL_BASED_OUTPATIENT_CLINIC_OR_DEPARTMENT_OTHER): Payer: Medicare Other | Admitting: Hematology & Oncology

## 2010-10-11 DIAGNOSIS — C9000 Multiple myeloma not having achieved remission: Secondary | ICD-10-CM

## 2010-10-11 LAB — CBC WITH DIFFERENTIAL (CANCER CENTER ONLY)
BASO#: 0 10*3/uL (ref 0.0–0.2)
EOS%: 2.8 % (ref 0.0–7.0)
Eosinophils Absolute: 0.1 10*3/uL (ref 0.0–0.5)
HCT: 43.5 % (ref 38.7–49.9)
HGB: 15.3 g/dL (ref 13.0–17.1)
LYMPH#: 1.2 10*3/uL (ref 0.9–3.3)
MCHC: 35.2 g/dL (ref 32.0–35.9)
MONO#: 0.6 10*3/uL (ref 0.1–0.9)
NEUT#: 2.8 10*3/uL (ref 1.5–6.5)
NEUT%: 59.3 % (ref 40.0–80.0)
RBC: 4.42 10*6/uL (ref 4.20–5.70)
WBC: 4.7 10*3/uL (ref 4.0–10.0)

## 2010-10-11 LAB — CHCC SATELLITE - SMEAR

## 2010-10-15 LAB — KAPPA/LAMBDA LIGHT CHAINS
Kappa free light chain: 1.41 mg/dL (ref 0.33–1.94)
Lambda Free Lght Chn: 1.25 mg/dL (ref 0.57–2.63)

## 2010-10-15 LAB — LACTATE DEHYDROGENASE: LDH: 121 U/L (ref 94–250)

## 2010-10-15 LAB — SPEP & IFE WITH QIG
Alpha-1-Globulin: 4.8 % (ref 2.9–4.9)
Alpha-2-Globulin: 9.2 % (ref 7.1–11.8)
Gamma Globulin: 14.4 % (ref 11.1–18.8)
Total Protein, Serum Electrophoresis: 5.9 g/dL — ABNORMAL LOW (ref 6.0–8.3)

## 2010-10-15 LAB — COMPREHENSIVE METABOLIC PANEL
Albumin: 3.9 g/dL (ref 3.5–5.2)
CO2: 23 mEq/L (ref 19–32)
Calcium: 9.1 mg/dL (ref 8.4–10.5)
Chloride: 102 mEq/L (ref 96–112)
Glucose, Bld: 151 mg/dL — ABNORMAL HIGH (ref 70–99)
Potassium: 3.9 mEq/L (ref 3.5–5.3)
Sodium: 137 mEq/L (ref 135–145)
Total Protein: 5.9 g/dL — ABNORMAL LOW (ref 6.0–8.3)

## 2011-01-10 ENCOUNTER — Other Ambulatory Visit: Payer: Self-pay | Admitting: Hematology & Oncology

## 2011-01-10 ENCOUNTER — Encounter (HOSPITAL_BASED_OUTPATIENT_CLINIC_OR_DEPARTMENT_OTHER): Payer: Medicare Other | Admitting: Hematology & Oncology

## 2011-01-10 DIAGNOSIS — C9000 Multiple myeloma not having achieved remission: Secondary | ICD-10-CM

## 2011-01-10 DIAGNOSIS — E559 Vitamin D deficiency, unspecified: Secondary | ICD-10-CM

## 2011-01-10 DIAGNOSIS — Z23 Encounter for immunization: Secondary | ICD-10-CM

## 2011-01-10 LAB — CBC WITH DIFFERENTIAL (CANCER CENTER ONLY)
BASO#: 0 10*3/uL (ref 0.0–0.2)
EOS%: 6.7 % (ref 0.0–7.0)
Eosinophils Absolute: 0.3 10*3/uL (ref 0.0–0.5)
HGB: 15.5 g/dL (ref 13.0–17.1)
LYMPH%: 26.5 % (ref 14.0–48.0)
MCH: 35.1 pg — ABNORMAL HIGH (ref 28.0–33.4)
MCHC: 36.1 g/dL — ABNORMAL HIGH (ref 32.0–35.9)
MCV: 97 fL (ref 82–98)
MONO%: 15 % — ABNORMAL HIGH (ref 0.0–13.0)
NEUT#: 1.9 10*3/uL (ref 1.5–6.5)
Platelets: 120 10*3/uL — ABNORMAL LOW (ref 145–400)
RBC: 4.42 10*6/uL (ref 4.20–5.70)

## 2011-01-14 LAB — COMPREHENSIVE METABOLIC PANEL
ALT: 26 U/L (ref 0–53)
Albumin: 3.7 g/dL (ref 3.5–5.2)
CO2: 29 mEq/L (ref 19–32)
Calcium: 8.8 mg/dL (ref 8.4–10.5)
Chloride: 103 mEq/L (ref 96–112)
Glucose, Bld: 203 mg/dL — ABNORMAL HIGH (ref 70–99)
Sodium: 137 mEq/L (ref 135–145)
Total Bilirubin: 1.2 mg/dL (ref 0.3–1.2)
Total Protein: 6 g/dL (ref 6.0–8.3)

## 2011-01-14 LAB — VITAMIN D 25 HYDROXY (VIT D DEFICIENCY, FRACTURES): Vit D, 25-Hydroxy: 57 ng/mL (ref 30–89)

## 2011-01-14 LAB — SPEP & IFE WITH QIG
Alpha-1-Globulin: 5.2 % — ABNORMAL HIGH (ref 2.9–4.9)
Alpha-2-Globulin: 10.2 % (ref 7.1–11.8)
Beta Globulin: 5.3 % (ref 4.7–7.2)
Gamma Globulin: 13.3 % (ref 11.1–18.8)
IgG (Immunoglobin G), Serum: 852 mg/dL (ref 650–1600)

## 2011-01-14 LAB — LACTATE DEHYDROGENASE: LDH: 128 U/L (ref 94–250)

## 2011-01-14 LAB — KAPPA/LAMBDA LIGHT CHAINS: Kappa free light chain: 1.82 mg/dL (ref 0.33–1.94)

## 2011-03-12 ENCOUNTER — Encounter: Payer: Self-pay | Admitting: *Deleted

## 2011-04-05 ENCOUNTER — Ambulatory Visit (HOSPITAL_BASED_OUTPATIENT_CLINIC_OR_DEPARTMENT_OTHER): Payer: Medicare Other | Admitting: Hematology & Oncology

## 2011-04-05 ENCOUNTER — Other Ambulatory Visit: Payer: Self-pay | Admitting: Hematology & Oncology

## 2011-04-05 ENCOUNTER — Encounter: Payer: Self-pay | Admitting: Hematology & Oncology

## 2011-04-05 ENCOUNTER — Other Ambulatory Visit (HOSPITAL_BASED_OUTPATIENT_CLINIC_OR_DEPARTMENT_OTHER): Payer: Medicare Other | Admitting: Lab

## 2011-04-05 DIAGNOSIS — E109 Type 1 diabetes mellitus without complications: Secondary | ICD-10-CM

## 2011-04-05 DIAGNOSIS — C9000 Multiple myeloma not having achieved remission: Secondary | ICD-10-CM

## 2011-04-05 HISTORY — DX: Multiple myeloma not having achieved remission: C90.00

## 2011-04-05 HISTORY — DX: Type 1 diabetes mellitus without complications: E10.9

## 2011-04-05 LAB — COMPREHENSIVE METABOLIC PANEL
BUN: 13 mg/dL (ref 6–23)
CO2: 26 mEq/L (ref 19–32)
Calcium: 9.1 mg/dL (ref 8.4–10.5)
Chloride: 103 mEq/L (ref 96–112)
Creatinine, Ser: 1.12 mg/dL (ref 0.50–1.35)
Glucose, Bld: 170 mg/dL — ABNORMAL HIGH (ref 70–99)

## 2011-04-05 LAB — CBC WITH DIFFERENTIAL (CANCER CENTER ONLY)
BASO#: 0 10*3/uL (ref 0.0–0.2)
HCT: 43.5 % (ref 38.7–49.9)
HGB: 15.3 g/dL (ref 13.0–17.1)
LYMPH#: 1.3 10*3/uL (ref 0.9–3.3)
MCV: 98 fL (ref 82–98)
MONO#: 0.6 10*3/uL (ref 0.1–0.9)
NEUT%: 58.3 % (ref 40.0–80.0)
WBC: 5 10*3/uL (ref 4.0–10.0)

## 2011-04-05 LAB — LACTATE DEHYDROGENASE: LDH: 129 U/L (ref 94–250)

## 2011-04-05 NOTE — Progress Notes (Signed)
CSN: [id number] This office note has been dictated. Gwynn Crossley R

## 2011-04-08 LAB — COMPREHENSIVE METABOLIC PANEL
AST: 21 U/L (ref 0–37)
Albumin: 3.8 g/dL (ref 3.5–5.2)
BUN: 13 mg/dL (ref 6–23)
CO2: 26 mEq/L (ref 19–32)
Calcium: 9.1 mg/dL (ref 8.4–10.5)
Chloride: 103 mEq/L (ref 96–112)
Potassium: 4.3 mEq/L (ref 3.5–5.3)

## 2011-04-08 LAB — LACTATE DEHYDROGENASE: LDH: 129 U/L (ref 94–250)

## 2011-04-08 LAB — KAPPA/LAMBDA LIGHT CHAINS: Lambda Free Lght Chn: 1.05 mg/dL (ref 0.57–2.63)

## 2011-04-08 NOTE — Progress Notes (Signed)
CC:   [name], M.D. Verita Schneiders, MD  DIAGNOSIS:  Kappa light chain myeloma.  CURRENT THERAPY:  Observation.  INTERIM HISTORY:  Mr. Ahlgren comes in for his followup.  He is doing real well.  He and his wife had a great time in [place].  They brought me back a couple of souvenirs and I am very blessed by that.  He stopped his Revlimid at the end of October.  He was on Revlimid for 2 years.  When we last saw him in August, his serum kappa light chain was 1.82 mg/dL.  There is no evidence of a monoclonal spike in his serum.  He has had no fever.  His blood sugars have been doing quite well.  They are going to try to sell their house.  [place]  They will be alternating houses every 6 months.  He has had no cough.  He has had no bony pain.  He has had no change in bowel or bladder habits.  There has been no leg swelling.  PHYSICAL EXAMINATION:  General Appearance:  This is a well-developed, well-nourished, white gentleman in no obvious distress.  Vital Signs: 97.6.  Pulse 70.  Respiratory rate 18.  Blood pressure 126/69.  Weight is 205.  Head and Neck Exam:  A normocephalic, atraumatic skull.  There are no ocular or oral lesions.  There are no palpable cervical or supraclavicular lymph nodes.  Lungs:  Clear bilaterally.  Cardiac Exam: Regular rate and rhythm with a normal S1 and S2.  There are no murmurs, rubs, or bruits.  Abdominal Exam:  Soft with good bowel sounds.  There is no palpable abdominal mass.  He has subcutaneous needle for his insulin pump.  There is no palpable hepatosplenomegaly.  Extremities: No cyanosis, clubbing, or edema.  Neurological Exam:  No focal neurological deficits.  Skin Exam:  No rashes, ecchymosis, or petechia.  LABORATORY STUDIES:  White cell count is 5, hemoglobin 15.3, hematocrit 43, and platelet count 143.  IMPRESSION:  Mr. Iovino is a 70 year old gentleman with kappa light chain myeloma.  He  did undergo induction chemotherapy.  His response was incredibly quick and supreme.  He basically got into a complete remission prior to stem cell transplantation.  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  We will follow him along conservatively now.  He had his 2 years of Revlimid.  We will go ahead and plan to get him back in 4 months for followup.    ______________________________ Josph Macho, M.D. PRE/MEDQ  D:  04/05/2011  T:  04/05/2011  Job:  [id number]

## 2011-04-09 LAB — PROTEIN ELECTROPHORESIS, SERUM
Beta 2: 4.6 % (ref 3.2–6.5)
Beta Globulin: 5.5 % (ref 4.7–7.2)

## 2011-05-06 ENCOUNTER — Telehealth: Payer: Self-pay | Admitting: *Deleted

## 2011-05-06 NOTE — Telephone Encounter (Signed)
Pt moved 3-8 to 3-25

## 2011-06-21 ENCOUNTER — Encounter: Payer: Self-pay | Admitting: Gastroenterology

## 2011-08-02 ENCOUNTER — Ambulatory Visit: Payer: Medicare Other | Admitting: Hematology & Oncology

## 2011-08-02 ENCOUNTER — Other Ambulatory Visit: Payer: Medicare Other | Admitting: Lab

## 2011-08-19 ENCOUNTER — Other Ambulatory Visit: Payer: Medicare Other | Admitting: Lab

## 2011-08-19 ENCOUNTER — Ambulatory Visit: Payer: Medicare Other | Admitting: Hematology & Oncology

## 2011-08-22 ENCOUNTER — Other Ambulatory Visit: Payer: Medicare Other | Admitting: Lab

## 2011-08-22 ENCOUNTER — Ambulatory Visit: Payer: Medicare Other | Admitting: Hematology & Oncology

## 2011-08-22 ENCOUNTER — Other Ambulatory Visit (HOSPITAL_BASED_OUTPATIENT_CLINIC_OR_DEPARTMENT_OTHER): Payer: Medicare Other | Admitting: Lab

## 2011-08-22 ENCOUNTER — Ambulatory Visit (HOSPITAL_BASED_OUTPATIENT_CLINIC_OR_DEPARTMENT_OTHER): Payer: Medicare Other | Admitting: Hematology & Oncology

## 2011-08-22 VITALS — BP 124/79 | HR 65 | Temp 96.4°F | Ht 71.0 in | Wt 206.0 lb

## 2011-08-22 DIAGNOSIS — I10 Essential (primary) hypertension: Secondary | ICD-10-CM | POA: Diagnosis not present

## 2011-08-22 DIAGNOSIS — C9000 Multiple myeloma not having achieved remission: Secondary | ICD-10-CM

## 2011-08-22 DIAGNOSIS — E109 Type 1 diabetes mellitus without complications: Secondary | ICD-10-CM | POA: Diagnosis not present

## 2011-08-22 DIAGNOSIS — B351 Tinea unguium: Secondary | ICD-10-CM | POA: Diagnosis not present

## 2011-08-22 DIAGNOSIS — D472 Monoclonal gammopathy: Secondary | ICD-10-CM | POA: Diagnosis not present

## 2011-08-22 LAB — CBC WITH DIFFERENTIAL (CANCER CENTER ONLY)
BASO%: 0.4 % (ref 0.0–2.0)
EOS%: 0.9 % (ref 0.0–7.0)
LYMPH#: 1.7 10*3/uL (ref 0.9–3.3)
MCH: 35.1 pg — ABNORMAL HIGH (ref 28.0–33.4)
MCHC: 35.2 g/dL (ref 32.0–35.9)
MONO%: 11.5 % (ref 0.0–13.0)
NEUT#: 2.9 10*3/uL (ref 1.5–6.5)
Platelets: 146 10*3/uL (ref 145–400)
RDW: 12.3 % (ref 11.1–15.7)

## 2011-08-22 NOTE — Progress Notes (Signed)
This office note has been dictated.

## 2011-08-23 NOTE — Progress Notes (Signed)
CC:   Rickey [name], M.D. Rickey Schneiders, MD  DIAGNOSIS:  Kappa light chain myeloma, remission.  CURRENT THERAPY:  Observation.  INTERIM HISTORY:  Rickey [name] comes in for follow-up.  He is doing great. He has been off his Revlimid now since October 2012.  He has had no issues with respect to recurrence of the myeloma.  His monoclonal protein studies have been fantastic.  Back in November, when we last saw him his serum free kappa light chain was 1.42 g per dL.  He did not have a monoclonal spike noted in his serum.  He has had no problems pain wise.  He has had no problems with bowels or bladder.  He and his wife took some friends from [place] on a [place] cruise during the wintertime.  They had a great time.  He is on an insulin pump.  His hemoglobin A1c levels have been less than 6.  He has had no problems with fevers, sweats or chills.  He has had no rashes.  PHYSICAL EXAMINATION:  General Appearance:  This is a well-developed, well-nourished white gentleman in no obvious distress.  Vital Signs: Show temperature of 96.4, pulse 65, respiratory rate 20, blood pressure 124/79.  Weight is 206.  Head and Neck Exam:  Shows a normocephalic, atraumatic skull.  There are no ocular or oral lesions.  There are no palpable cervical or supraclavicular lymph nodes.  Lungs:  Clear to percussion and auscultation bilaterally.  Cardiac Exam:  Regular rate and rhythm with a normal S1 and S2.  There are no murmurs, rubs or bruits.  Abdominal Exam:  Soft with good bowel sounds.  There is no palpable abdominal mass.  There is no fluid wave.  There is no palpable hepatosplenomegaly.  Back Exam:  No tenderness over the spine, ribs or hips.  Extremities:  Show no clubbing, cyanosis or edema.  Neurological Exam:  Shows no focal neurological deficits.  LABORATORY STUDIES:  White cell count is 5.3, hemoglobin 15.4, hematocrit 43.8, platelet count 146.  IMPRESSION:  Rickey [name] is a 71 year old  gentleman with kappa light chain myeloma.  He did undergo stem cell therapy.  He had this at [facility]. He completed his stem cell transplant back in August 2010.  He was then on maintenance Revlimid for 2 years.  We will continue to follow him supportively.  We will plan to get him back in 4 months' time.  Once we get him through this year, then we will go every 6 months starting in 2014.    ______________________________ Rickey [name], M.D. PRE/MEDQ  D:  08/22/2011  T:  08/23/2011  Job:  [id number]   ADDENDUM:  KAPPA light chain is 1.41 mg/dL.  (-) M-Spike in serum.

## 2011-08-26 DIAGNOSIS — H6120 Impacted cerumen, unspecified ear: Secondary | ICD-10-CM | POA: Diagnosis not present

## 2011-08-26 LAB — PROTEIN ELECTROPHORESIS, SERUM, WITH REFLEX
Alpha-1-Globulin: 4.2 % (ref 2.9–4.9)
Beta 2: 4 % (ref 3.2–6.5)
Gamma Globulin: 12.8 % (ref 11.1–18.8)

## 2011-08-26 LAB — COMPREHENSIVE METABOLIC PANEL
ALT: 16 U/L (ref 0–53)
AST: 20 U/L (ref 0–37)
Albumin: 4.2 g/dL (ref 3.5–5.2)
Alkaline Phosphatase: 52 U/L (ref 39–117)
BUN: 18 mg/dL (ref 6–23)
Potassium: 4.2 mEq/L (ref 3.5–5.3)
Sodium: 140 mEq/L (ref 135–145)
Total Protein: 6.3 g/dL (ref 6.0–8.3)

## 2011-08-27 ENCOUNTER — Encounter: Payer: Self-pay | Admitting: Gastroenterology

## 2011-08-27 ENCOUNTER — Telehealth: Payer: Self-pay | Admitting: *Deleted

## 2011-08-27 NOTE — Telephone Encounter (Addendum)
Message copied by Mirian Capuchin on Tue Aug 27, 2011  2:04 PM ------      Message from: [name]      Created: Mon Aug 26, 2011  6:58 PM       Please call him and tell him that there is still no obvious myeloma that is active.  Pt notified and voiced understanding.

## 2011-10-11 ENCOUNTER — Ambulatory Visit (AMBULATORY_SURGERY_CENTER): Payer: Medicare Other

## 2011-10-11 VITALS — Ht 73.0 in | Wt 209.0 lb

## 2011-10-11 DIAGNOSIS — Z860100 Personal history of colon polyps, unspecified: Secondary | ICD-10-CM

## 2011-10-11 DIAGNOSIS — Z1211 Encounter for screening for malignant neoplasm of colon: Secondary | ICD-10-CM

## 2011-10-17 ENCOUNTER — Telehealth: Payer: Self-pay | Admitting: Gastroenterology

## 2011-10-17 DIAGNOSIS — Z1211 Encounter for screening for malignant neoplasm of colon: Secondary | ICD-10-CM

## 2011-10-17 MED ORDER — PEG-KCL-NACL-NASULF-NA ASC-C 100 G PO SOLR: ORAL | Status: DC

## 2011-10-17 NOTE — Telephone Encounter (Signed)
[facility] pharmacy did not receive prep.  Moviprep Rx sent to [facility]. Ezra Sites

## 2011-10-23 ENCOUNTER — Encounter: Payer: Self-pay | Admitting: Gastroenterology

## 2011-10-23 ENCOUNTER — Ambulatory Visit (AMBULATORY_SURGERY_CENTER): Payer: Medicare Other | Admitting: Gastroenterology

## 2011-10-23 VITALS — BP 128/68 | HR 76 | Temp 96.0°F | Resp 20 | Ht 73.0 in | Wt 209.0 lb

## 2011-10-23 DIAGNOSIS — Z86010 Personal history of colonic polyps: Secondary | ICD-10-CM | POA: Diagnosis not present

## 2011-10-23 DIAGNOSIS — K5730 Diverticulosis of large intestine without perforation or abscess without bleeding: Secondary | ICD-10-CM | POA: Diagnosis not present

## 2011-10-23 DIAGNOSIS — Z1211 Encounter for screening for malignant neoplasm of colon: Secondary | ICD-10-CM

## 2011-10-23 LAB — GLUCOSE, CAPILLARY: Glucose-Capillary: 131 mg/dL — ABNORMAL HIGH (ref 70–99)

## 2011-10-23 MED ORDER — SODIUM CHLORIDE 0.9 % IV SOLN: 500.0000 mL | INTRAVENOUS | Status: DC

## 2011-10-23 NOTE — Progress Notes (Signed)
Patient did not experience any of the following events: a burn prior to discharge; a fall within the facility; wrong site/side/patient/procedure/implant event; or a hospital transfer or hospital admission upon discharge from the facility. (G8907) Patient did not have preoperative order for IV antibiotic SSI prophylaxis. (G8918)  

## 2011-10-23 NOTE — Op Note (Signed)
[facility] [address] [place], [place]  [address]  COLONOSCOPY PROCEDURE REPORT  PATIENT:  Rickey [name], Rickey [name]  MR#:  [id number] BIRTHDATE:  12/26/1940, 70 yrs. old  GENDER:  male ENDOSCOPIST:  Barbette Hair. Arlyce Dice, MD REF. BY:  Chilton Greathouse, M.D. PROCEDURE DATE:  10/23/2011 PROCEDURE:  Diagnostic Colonoscopy ASA CLASS:  Class II INDICATIONS:  Screening, history of pre-cancerous (adenomatous) colon polyps Index polypectomy 2002 2006 colo - tics MEDICATIONS:   MAC sedation, administered by CRNA propofol 120mg IV  DESCRIPTION OF PROCEDURE:   After the risks benefits and alternatives of the procedure were thoroughly explained, informed consent was obtained.  Digital rectal exam was performed and revealed no abnormalities.   The LB CF-[id number] endoscope was introduced through the anus and advanced to the cecum, which was identified by both the appendix and ileocecal valve, without limitations.  The quality of the prep was excellent, using MoviPrep.  The instrument was then slowly withdrawn as the colon was fully examined. <<PROCEDUREIMAGES>>  FINDINGS:  Scattered diverticula were found in the sigmoid colon. This was otherwise a normal examination of the colon (see image3 and image4).   Retroflexed views in the rectum revealed no abnormalities.    The time to cecum =  1) 3.25  minutes. The scope was then withdrawn in  1) 7.50  minutes from the cecum and the procedure completed. COMPLICATIONS:  None ENDOSCOPIC IMPRESSION: 1) Diverticula, scattered in the sigmoid colon 2) Otherwise normal examination RECOMMENDATIONS: 1) Colonoscopy 10 years REPEAT EXAM:  In 10 year(s) for Colonoscopy.  ______________________________ Barbette Hair. Arlyce Dice, MD  CC:  n. eSIGNED:   Barbette Hair. Cataleah Stites at 10/23/2011 12:24 PM  Chauncey Cruel, [id number]

## 2011-10-23 NOTE — Patient Instructions (Signed)
Discharge instructions given with verbal understanding. Resume previous medications.YOU HAD AN ENDOSCOPIC PROCEDURE TODAY AT THE [facility]: Refer to the procedure report that was given to you for any specific questions about what was found during the examination.  If the procedure report does not answer your questions, please call your gastroenterologist to clarify.  If you requested that your care partner not be given the details of your procedure findings, then the procedure report has been included in a sealed envelope for you to review at your convenience later.  YOU SHOULD EXPECT: Some feelings of bloating in the abdomen. Passage of more gas than usual.  Walking can help get rid of the air that was put into your GI tract during the procedure and reduce the bloating. If you had a lower endoscopy (such as a colonoscopy or flexible sigmoidoscopy) you may notice spotting of blood in your stool or on the toilet paper. If you underwent a bowel prep for your procedure, then you may not have a normal bowel movement for a few days.  DIET: Your first meal following the procedure should be a light meal and then it is ok to progress to your normal diet.  A half-sandwich or bowl of soup is an example of a good first meal.  Heavy or fried foods are harder to digest and may make you feel nauseous or bloated.  Likewise meals heavy in dairy and vegetables can cause extra gas to form and this can also increase the bloating.  Drink plenty of fluids but you should avoid alcoholic beverages for 24 hours.  ACTIVITY: Your care partner should take you home directly after the procedure.  You should plan to take it easy, moving slowly for the rest of the day.  You can resume normal activity the day after the procedure however you should NOT DRIVE or use heavy machinery for 24 hours (because of the sedation medicines used during the test).    SYMPTOMS TO REPORT IMMEDIATELY: A gastroenterologist can be reached at  any hour.  During normal business hours, 8:30 AM to 5:00 PM Monday through Friday, call [phone].  After hours and on weekends, please call the GI answering service at [phone] who will take a message and have the physician on call contact you.   Following lower endoscopy (colonoscopy or flexible sigmoidoscopy):  Excessive amounts of blood in the stool  Significant tenderness or worsening of abdominal pains  Swelling of the abdomen that is new, acute  Fever of 100F or higher  FOLLOW UP: If any biopsies were taken you will be contacted by phone or by letter within the next 1-3 weeks.  Call your gastroenterologist if you have not heard about the biopsies in 3 weeks.  Our staff will call the home number listed on your records the next business day following your procedure to check on you and address any questions or concerns that you may have at that time regarding the information given to you following your procedure. This is a courtesy call and so if there is no answer at the home number and we have not heard from you through the emergency physician on call, we will assume that you have returned to your regular daily activities without incident.  SIGNATURES/CONFIDENTIALITY: You and/or your care partner have signed paperwork which will be entered into your electronic medical record.  These signatures attest to the fact that that the information above on your After Visit Summary has been reviewed and is understood.    Full responsibility of the confidentiality of this discharge information lies with you and/or your care-partner.  

## 2011-10-24 ENCOUNTER — Telehealth: Payer: Self-pay

## 2011-10-24 LAB — GLUCOSE, CAPILLARY: Glucose-Capillary: 110 mg/dL — ABNORMAL HIGH (ref 70–99)

## 2011-10-24 NOTE — Telephone Encounter (Signed)
  Follow up Call-  Call back number 10/23/2011  Post procedure Call Back phone  # [phone]  Permission to leave phone message Yes     Patient questions:  Do you have a fever, pain , or abdominal swelling? no Pain Score  0 *  Have you tolerated food without any problems? yes  Have you been able to return to your normal activities? yes  Do you have any questions about your discharge instructions: Diet   no Medications  no Follow up visit  no  Do you have questions or concerns about your Care? no  Actions: * If pain score is 4 or above: No action needed, pain <4.

## 2011-11-19 DIAGNOSIS — E119 Type 2 diabetes mellitus without complications: Secondary | ICD-10-CM | POA: Diagnosis not present

## 2011-11-19 DIAGNOSIS — C9000 Multiple myeloma not having achieved remission: Secondary | ICD-10-CM | POA: Diagnosis not present

## 2011-11-19 DIAGNOSIS — Z0000 Encounter for general adult medical examination without abnormal findings: Secondary | ICD-10-CM | POA: Diagnosis not present

## 2011-11-19 DIAGNOSIS — I4510 Unspecified right bundle-branch block: Secondary | ICD-10-CM | POA: Diagnosis not present

## 2011-11-19 DIAGNOSIS — Z125 Encounter for screening for malignant neoplasm of prostate: Secondary | ICD-10-CM | POA: Diagnosis not present

## 2011-11-19 DIAGNOSIS — E7800 Pure hypercholesterolemia, unspecified: Secondary | ICD-10-CM | POA: Diagnosis not present

## 2011-11-19 DIAGNOSIS — E109 Type 1 diabetes mellitus without complications: Secondary | ICD-10-CM | POA: Diagnosis not present

## 2011-11-19 DIAGNOSIS — E1139 Type 2 diabetes mellitus with other diabetic ophthalmic complication: Secondary | ICD-10-CM | POA: Diagnosis not present

## 2011-11-19 DIAGNOSIS — I10 Essential (primary) hypertension: Secondary | ICD-10-CM | POA: Diagnosis not present

## 2011-11-19 DIAGNOSIS — E11329 Type 2 diabetes mellitus with mild nonproliferative diabetic retinopathy without macular edema: Secondary | ICD-10-CM | POA: Diagnosis not present

## 2011-11-19 DIAGNOSIS — H579 Unspecified disorder of eye and adnexa: Secondary | ICD-10-CM | POA: Diagnosis not present

## 2012-01-16 ENCOUNTER — Ambulatory Visit (HOSPITAL_BASED_OUTPATIENT_CLINIC_OR_DEPARTMENT_OTHER): Payer: Medicare Other | Admitting: Hematology & Oncology

## 2012-01-16 ENCOUNTER — Other Ambulatory Visit (HOSPITAL_BASED_OUTPATIENT_CLINIC_OR_DEPARTMENT_OTHER): Payer: Medicare Other | Admitting: Lab

## 2012-01-16 VITALS — BP 121/65 | HR 71 | Temp 97.8°F | Resp 20 | Ht 72.0 in | Wt 207.0 lb

## 2012-01-16 DIAGNOSIS — C9000 Multiple myeloma not having achieved remission: Secondary | ICD-10-CM

## 2012-01-16 LAB — CBC WITH DIFFERENTIAL (CANCER CENTER ONLY)
BASO%: 0.5 % (ref 0.0–2.0)
Eosinophils Absolute: 0.2 10*3/uL (ref 0.0–0.5)
MONO#: 0.8 10*3/uL (ref 0.1–0.9)
NEUT#: 3.3 10*3/uL (ref 1.5–6.5)
Platelets: 169 10*3/uL (ref 145–400)
RBC: 4.8 10*6/uL (ref 4.20–5.70)
RDW: 12.6 % (ref 11.1–15.7)
WBC: 5.7 10*3/uL (ref 4.0–10.0)

## 2012-01-16 NOTE — Progress Notes (Signed)
This office note has been dictated.

## 2012-01-17 NOTE — Progress Notes (Signed)
CC:   Rickey [name], M.D. Rickey Schneiders, MD  DIAGNOSIS:  Kappa light chain myeloma-clinical remission.  CURRENT THERAPY:  Observation.  INTERIM HISTORY:  Rickey [name] comes in.  We last saw him back in late March.  He has really done well.  Since we last saw him, he has been [place] the mountains.  They have a house which they have really enjoyed. Unfortunately, there has been a lot of rain this year for them.  A couple of weeks ago they went to [place] and [place].  They definitely do a lot of traveling.  I am glad that he is able to.  The diabetes has not been a problem.  He is on insulin pump.  When we last saw him, there was no detectable monoclonal spike in his serum.  His kappa light chain was 1.41 mg/dL.  He has had no change in his bowel or bladder habits.  He has had no leg swelling.  There has been no rashes.  He has had no cough or shortness of breath.  PHYSICAL EXAMINATION:  General: This is a well-developed, well- nourished, white gentleman in no obvious distress.  Vital signs: 97.8, pulse 71, respiratory rate 20, blood pressure 121/65.  Weight is 207. Head and neck:  Normocephalic, atraumatic skull.  There are no ocular or oral lesions.  There are no palpable cervical or supraclavicular lymph nodes.  Lungs:  Clear bilaterally.  Cardiac:  Regular rate and rhythm with a normal S1 and S2.  There are no murmurs, rubs, or bruits. Abdomen:  Soft with good bowel sounds.  There was no palpable abdominal mass.  There was no palpable hepatosplenomegaly.  Extremities:  No clubbing, cyanosis or edema.  Neurological:  Shows no focal neurological deficits.  LABORATORY STUDIES:  White cell count is 5.7, hemoglobin 16.7, hematocrit 47.3, platelet count 169.  IMPRESSION:  Rickey [name] is a 71 year old gentleman with kappa light chain myeloma.  He is in remission.  We got him into remission initially very quickly.  He had a stem cell transplant a few years ago in August.  He did have Revlemid  for 2 years as maintenance.  I forgot to mention that he is taking aspirin once a day right now. This is a baby aspirin.  We will plan to get him back in 4 months or so.    ______________________________ Rickey [name], M.D. PRE/MEDQ  D:  01/16/2012  T:  01/17/2012  Job:  [id number]

## 2012-01-20 LAB — KAPPA/LAMBDA LIGHT CHAINS
Kappa free light chain: 0.89 mg/dL (ref 0.33–1.94)
Lambda Free Lght Chn: 0.78 mg/dL (ref 0.57–2.63)

## 2012-01-20 LAB — COMPREHENSIVE METABOLIC PANEL
ALT: 15 U/L (ref 0–53)
Albumin: 3.8 g/dL (ref 3.5–5.2)
Alkaline Phosphatase: 87 U/L (ref 39–117)
CO2: 28 mEq/L (ref 19–32)
Glucose, Bld: 146 mg/dL — ABNORMAL HIGH (ref 70–99)
Potassium: 4.2 mEq/L (ref 3.5–5.3)
Sodium: 138 mEq/L (ref 135–145)
Total Bilirubin: 0.9 mg/dL (ref 0.3–1.2)
Total Protein: 6.1 g/dL (ref 6.0–8.3)

## 2012-01-20 LAB — PROTEIN ELECTROPHORESIS, SERUM, WITH REFLEX
Albumin ELP: 61.3 % (ref 55.8–66.1)
Alpha-1-Globulin: 5.3 % — ABNORMAL HIGH (ref 2.9–4.9)
Beta 2: 5.1 % (ref 3.2–6.5)
Beta Globulin: 5.7 % (ref 4.7–7.2)
Gamma Globulin: 11.6 % (ref 11.1–18.8)

## 2012-01-20 LAB — IGG, IGA, IGM: IgM, Serum: 27 mg/dL — ABNORMAL LOW (ref 41–251)

## 2012-01-23 ENCOUNTER — Telehealth: Payer: Self-pay | Admitting: *Deleted

## 2012-01-23 NOTE — Telephone Encounter (Signed)
Called patient to let him know that his myeloma levels were normal per dr. Myna Hidalgo

## 2012-01-23 NOTE — Telephone Encounter (Signed)
Message copied by Anselm Jungling on Thu Jan 23, 2012 11:17 AM ------      Message from: Josph Macho      Created: Sun Jan 19, 2012  7:34 PM       Call - still no evidence of myeloma.  pete

## 2012-02-26 DIAGNOSIS — Z23 Encounter for immunization: Secondary | ICD-10-CM | POA: Diagnosis not present

## 2012-03-17 DIAGNOSIS — Z1331 Encounter for screening for depression: Secondary | ICD-10-CM | POA: Diagnosis not present

## 2012-03-17 DIAGNOSIS — I10 Essential (primary) hypertension: Secondary | ICD-10-CM | POA: Diagnosis not present

## 2012-03-17 DIAGNOSIS — E109 Type 1 diabetes mellitus without complications: Secondary | ICD-10-CM | POA: Diagnosis not present

## 2012-03-17 DIAGNOSIS — C9000 Multiple myeloma not having achieved remission: Secondary | ICD-10-CM | POA: Diagnosis not present

## 2012-03-18 DIAGNOSIS — E291 Testicular hypofunction: Secondary | ICD-10-CM | POA: Diagnosis not present

## 2012-04-03 DIAGNOSIS — L570 Actinic keratosis: Secondary | ICD-10-CM | POA: Diagnosis not present

## 2012-04-03 DIAGNOSIS — D239 Other benign neoplasm of skin, unspecified: Secondary | ICD-10-CM | POA: Diagnosis not present

## 2012-04-03 DIAGNOSIS — D234 Other benign neoplasm of skin of scalp and neck: Secondary | ICD-10-CM | POA: Diagnosis not present

## 2012-04-03 DIAGNOSIS — L821 Other seborrheic keratosis: Secondary | ICD-10-CM | POA: Diagnosis not present

## 2012-04-03 DIAGNOSIS — D1801 Hemangioma of skin and subcutaneous tissue: Secondary | ICD-10-CM | POA: Diagnosis not present

## 2012-04-03 DIAGNOSIS — D485 Neoplasm of uncertain behavior of skin: Secondary | ICD-10-CM | POA: Diagnosis not present

## 2012-05-06 ENCOUNTER — Telehealth: Payer: Self-pay | Admitting: Hematology & Oncology

## 2012-05-06 NOTE — Telephone Encounter (Signed)
Pt moved 2-21 to 1-23 due to being in town

## 2012-05-19 DIAGNOSIS — J029 Acute pharyngitis, unspecified: Secondary | ICD-10-CM | POA: Diagnosis not present

## 2012-05-19 DIAGNOSIS — J069 Acute upper respiratory infection, unspecified: Secondary | ICD-10-CM | POA: Diagnosis not present

## 2012-06-18 ENCOUNTER — Ambulatory Visit (HOSPITAL_BASED_OUTPATIENT_CLINIC_OR_DEPARTMENT_OTHER): Payer: Medicare Other | Admitting: Hematology & Oncology

## 2012-06-18 ENCOUNTER — Other Ambulatory Visit (HOSPITAL_BASED_OUTPATIENT_CLINIC_OR_DEPARTMENT_OTHER): Payer: Medicare Other | Admitting: Lab

## 2012-06-18 VITALS — BP 108/54 | HR 79 | Temp 97.5°F | Resp 20 | Ht 70.0 in | Wt 206.0 lb

## 2012-06-18 DIAGNOSIS — C9000 Multiple myeloma not having achieved remission: Secondary | ICD-10-CM | POA: Diagnosis not present

## 2012-06-18 LAB — CBC WITH DIFFERENTIAL (CANCER CENTER ONLY)
BASO#: 0 10*3/uL (ref 0.0–0.2)
Eosinophils Absolute: 0.1 10*3/uL (ref 0.0–0.5)
HCT: 48.6 % (ref 38.7–49.9)
HGB: 16.9 g/dL (ref 13.0–17.1)
MCH: 34.2 pg — ABNORMAL HIGH (ref 28.0–33.4)
MCV: 98 fL (ref 82–98)
MONO%: 11.7 % (ref 0.0–13.0)
NEUT#: 4.2 10*3/uL (ref 1.5–6.5)
RBC: 4.94 10*6/uL (ref 4.20–5.70)

## 2012-06-18 NOTE — Progress Notes (Signed)
This office note has been dictated.

## 2012-06-19 DIAGNOSIS — C9000 Multiple myeloma not having achieved remission: Secondary | ICD-10-CM | POA: Diagnosis not present

## 2012-06-19 DIAGNOSIS — Z23 Encounter for immunization: Secondary | ICD-10-CM | POA: Diagnosis not present

## 2012-06-19 DIAGNOSIS — E291 Testicular hypofunction: Secondary | ICD-10-CM | POA: Diagnosis not present

## 2012-06-19 DIAGNOSIS — Z1331 Encounter for screening for depression: Secondary | ICD-10-CM | POA: Diagnosis not present

## 2012-06-19 DIAGNOSIS — E109 Type 1 diabetes mellitus without complications: Secondary | ICD-10-CM | POA: Diagnosis not present

## 2012-06-19 NOTE — Progress Notes (Signed)
CC:   [name], M.D. Verita Schneiders, MD  DIAGNOSIS:  Kappa light chain myeloma-remission.  CURRENT THERAPY:  Observation.  INTERIM HISTORY:  Mr. Corado comes in for a followup.  He is doing well. As always, he and his wife have been traveling.  They had a good time in [place] last year.  They are planning to go to [place] and the [place] in, I think, September.  He is doing well with his insulin pump.  His diabetes has not been an issue for him.  There have been no pain issues.  He has had no change in bowel or bladder habits.  When we last saw him, his kappa light chain, which is a good marker for him, was 0.89 mg/dL.  He has had no fevers, sweats, or chills.  PHYSICAL EXAMINATION:  General:  This is a well-developed, well- nourished white gentleman in no obvious distress.  Vital signs: Temperature of 97.5, pulse 79, respiratory rate 20, blood pressure 108/54.  Weight is 206.  Head and neck:  Normocephalic, atraumatic skull.  There are no ocular or oral lesions.  There are no palpable cervical or supraclavicular lymph nodes.  Lungs:  Clear bilaterally. Cardiac:  Regular rate and rhythm with a normal S1 and S2.  There are no murmurs, rubs, or bruits.  Abdomen:  Soft with good bowel sounds.  There is no palpable abdominal mass.  There is no fluid wave.  There is no palpable hepatosplenomegaly.  Back:  No tenderness over the spine, ribs, or hips.  Extremities:  No clubbing, cyanosis, or edema.  Skin:  No rashes, ecchymosis, or petechia.  LABORATORY STUDIES:  White cell count is 6.5, hemoglobin is 16.9, hematocrit 48.6, platelet count 147.  IMPRESSION:  Mr. Massman is a 72 year old gentleman with a history of kappa light chain myeloma.  He is doing incredibly well.  He underwent transplant I think back in August 2010.  We then had him on Revlimid for maintenance.  He is doing very well right now.  Again, I do not see any evidence of myeloma recurrence.  We will plan to  get him back in another 4-5 months now.  I think once we get him through this year, then we can go every 6 months.    ______________________________ Josph Macho, M.D. PRE/MEDQ  D:  06/18/2012  T:  06/19/2012  Job:  [id number]

## 2012-06-22 ENCOUNTER — Telehealth: Payer: Self-pay | Admitting: *Deleted

## 2012-06-22 LAB — COMPREHENSIVE METABOLIC PANEL
Albumin: 3.9 g/dL (ref 3.5–5.2)
Alkaline Phosphatase: 42 U/L (ref 39–117)
BUN: 16 mg/dL (ref 6–23)
Calcium: 9 mg/dL (ref 8.4–10.5)
Glucose, Bld: 145 mg/dL — ABNORMAL HIGH (ref 70–99)
Potassium: 4.2 mEq/L (ref 3.5–5.3)

## 2012-06-22 LAB — IGG, IGA, IGM
IgA: 155 mg/dL (ref 68–379)
IgM, Serum: 26 mg/dL — ABNORMAL LOW (ref 41–251)

## 2012-06-22 LAB — PROTEIN ELECTROPHORESIS, SERUM, WITH REFLEX
Alpha-2-Globulin: 9.4 % (ref 7.1–11.8)
Beta 2: 4.2 % (ref 3.2–6.5)
Beta Globulin: 5.6 % (ref 4.7–7.2)
Gamma Globulin: 12.7 % (ref 11.1–18.8)

## 2012-06-22 LAB — KAPPA/LAMBDA LIGHT CHAINS: Kappa:Lambda Ratio: 1.14 (ref 0.26–1.65)

## 2012-06-22 NOTE — Telephone Encounter (Addendum)
Message copied by Mirian Capuchin on Mon Jun 22, 2012 12:45 PM ------      Message from: Josph Macho      Created: Sun Jun 21, 2012  4:39 PM       Call - myeloma still not active!!!  Cindee Lame This message given to pt.  Voiced understanding.

## 2012-06-24 DIAGNOSIS — E109 Type 1 diabetes mellitus without complications: Secondary | ICD-10-CM | POA: Diagnosis not present

## 2012-06-24 DIAGNOSIS — E7800 Pure hypercholesterolemia, unspecified: Secondary | ICD-10-CM | POA: Diagnosis not present

## 2012-07-17 ENCOUNTER — Ambulatory Visit: Payer: Medicare Other | Admitting: Hematology & Oncology

## 2012-07-17 ENCOUNTER — Other Ambulatory Visit: Payer: Medicare Other | Admitting: Lab

## 2012-10-15 ENCOUNTER — Other Ambulatory Visit (HOSPITAL_BASED_OUTPATIENT_CLINIC_OR_DEPARTMENT_OTHER): Payer: Medicare Other | Admitting: Lab

## 2012-10-15 ENCOUNTER — Ambulatory Visit (HOSPITAL_BASED_OUTPATIENT_CLINIC_OR_DEPARTMENT_OTHER): Payer: Medicare Other | Admitting: Hematology & Oncology

## 2012-10-15 VITALS — BP 129/61 | HR 68 | Temp 97.6°F | Resp 18 | Ht 70.0 in | Wt 205.0 lb

## 2012-10-15 DIAGNOSIS — C9000 Multiple myeloma not having achieved remission: Secondary | ICD-10-CM | POA: Diagnosis not present

## 2012-10-15 DIAGNOSIS — C9001 Multiple myeloma in remission: Secondary | ICD-10-CM

## 2012-10-15 LAB — CBC WITH DIFFERENTIAL (CANCER CENTER ONLY)
BASO#: 0 10*3/uL (ref 0.0–0.2)
Eosinophils Absolute: 0.1 10*3/uL (ref 0.0–0.5)
HGB: 16.1 g/dL (ref 13.0–17.1)
LYMPH#: 1.2 10*3/uL (ref 0.9–3.3)
MONO#: 0.6 10*3/uL (ref 0.1–0.9)
MONO%: 11.1 % (ref 0.0–13.0)
NEUT#: 3.5 10*3/uL (ref 1.5–6.5)
Platelets: 161 10*3/uL (ref 145–400)
RBC: 4.72 10*6/uL (ref 4.20–5.70)
WBC: 5.4 10*3/uL (ref 4.0–10.0)

## 2012-10-15 NOTE — Progress Notes (Signed)
This office note has been dictated.

## 2012-10-16 DIAGNOSIS — E1169 Type 2 diabetes mellitus with other specified complication: Secondary | ICD-10-CM | POA: Diagnosis not present

## 2012-10-16 DIAGNOSIS — B351 Tinea unguium: Secondary | ICD-10-CM | POA: Diagnosis not present

## 2012-10-16 DIAGNOSIS — E291 Testicular hypofunction: Secondary | ICD-10-CM | POA: Diagnosis not present

## 2012-10-16 DIAGNOSIS — I10 Essential (primary) hypertension: Secondary | ICD-10-CM | POA: Diagnosis not present

## 2012-10-16 DIAGNOSIS — C9000 Multiple myeloma not having achieved remission: Secondary | ICD-10-CM | POA: Diagnosis not present

## 2012-10-16 DIAGNOSIS — Z6827 Body mass index (BMI) 27.0-27.9, adult: Secondary | ICD-10-CM | POA: Diagnosis not present

## 2012-10-16 LAB — KAPPA/LAMBDA LIGHT CHAINS: Lambda Free Lght Chn: 1.3 mg/dL (ref 0.57–2.63)

## 2012-10-16 LAB — COMPREHENSIVE METABOLIC PANEL
Albumin: 3.9 g/dL (ref 3.5–5.2)
Alkaline Phosphatase: 39 U/L (ref 39–117)
BUN: 18 mg/dL (ref 6–23)
CO2: 26 mEq/L (ref 19–32)
Calcium: 8.9 mg/dL (ref 8.4–10.5)
Chloride: 104 mEq/L (ref 96–112)
Glucose, Bld: 184 mg/dL — ABNORMAL HIGH (ref 70–99)
Potassium: 4.4 mEq/L (ref 3.5–5.3)
Sodium: 136 mEq/L (ref 135–145)
Total Protein: 5.9 g/dL — ABNORMAL LOW (ref 6.0–8.3)

## 2012-10-16 NOTE — Progress Notes (Signed)
CC:   Verita Schneiders, MD [name], M.D.  DIAGNOSIS:  Kappa light chain myeloma, clinical remission.  CURRENT THERAPY:  Observation.  INTERIM HISTORY:  Mr. Vanwagner comes in for followup.  As always, he is doing quite well.  He got through the wintertime without any problems. [place]  They had some sleet and ice while they were there but did not lose any power.  When he was last here back in January, his kappa light chain was 1.41 mg/dL.  There was no monoclonal spike noted in his serum.  His heavy chains were normal.  His blood sugars have been under very good control. He sees Dr. Felipa Eth for this.  He has had no problems with nausea or vomiting.  He has had no headache. He has had no fevers, sweats or chills.  He has had no change in bowel or bladder habits.  They are still planning to go to [place] in July.  PHYSICAL EXAMINATION:  General:  This is a well-developed, well- nourished white gentleman in no obvious distress.  Vital signs:  Show temperature of 97.6, pulse 68, respiratory rate 18, blood pressure 129/61.  Weight is 205.  Head and neck:  Shows a normocephalic, atraumatic skull.  There are no ocular or oral lesions.  There are no palpable cervical or supraclavicular lymph nodes.  Lungs:  Clear bilaterally.  Cardiac:  Regular rate and rhythm with normal S1, S2. There are no murmurs, rubs or bruits.  Abdomen:  Soft with good bowel sounds.  There is no palpable abdominal mass.  There is no fluid wave. There is no palpable hepatosplenomegaly.  Back:  No tenderness over the spine, ribs or hips.  Extremities:  Show no clubbing, cyanosis or edema. Skin:  No rash, ecchymosis or petechia.  LABORATORY STUDIES:  White cell count is 5.4, hemoglobin 16.1, hematocrit 47.2, platelet count 161,000.  IMPRESSION:  Mr. Scoggin is a very nice 72 year old gentleman with kappa light chain myeloma.  He had gone into remission with Velcade/Revlimid. We then got  him transplant.  He had his transplant back in August of 2010.  We are following him up with 2 years of Revlimid for maintenance therapy.  We will continue to follow him every 6 months.  I do not see a problem with myeloma at this point in time.    ______________________________ Josph Macho, M.D. PRE/MEDQ  D:  10/15/2012  T:  10/16/2012  Job:  [id number]

## 2012-10-20 ENCOUNTER — Telehealth: Payer: Self-pay | Admitting: Oncology

## 2012-10-20 NOTE — Telephone Encounter (Deleted)
Message copied by Lacie Draft on Tue Oct 20, 2012  3:03 PM ------      Message from: [name]      Created: Mon Oct 19, 2012  8:26 AM       Please call and let him know that myeloma is still not active.  Rickey [name] ------

## 2012-10-20 NOTE — Telephone Encounter (Addendum)
Message copied by Lacie Draft on Tue Oct 20, 2012  3:19 PM ------      Message from: [name]      Created: Mon Oct 19, 2012  8:26 AM       Please call and let him know that myeloma is still not active.  Rickey [name] ------Left message on patient's voicemail.

## 2013-01-06 DIAGNOSIS — H6120 Impacted cerumen, unspecified ear: Secondary | ICD-10-CM | POA: Diagnosis not present

## 2013-01-18 DIAGNOSIS — E109 Type 1 diabetes mellitus without complications: Secondary | ICD-10-CM | POA: Diagnosis not present

## 2013-01-18 DIAGNOSIS — E7800 Pure hypercholesterolemia, unspecified: Secondary | ICD-10-CM | POA: Diagnosis not present

## 2013-01-18 DIAGNOSIS — I10 Essential (primary) hypertension: Secondary | ICD-10-CM | POA: Diagnosis not present

## 2013-01-18 DIAGNOSIS — Z6828 Body mass index (BMI) 28.0-28.9, adult: Secondary | ICD-10-CM | POA: Diagnosis not present

## 2013-03-01 DIAGNOSIS — I10 Essential (primary) hypertension: Secondary | ICD-10-CM | POA: Diagnosis not present

## 2013-03-01 DIAGNOSIS — E1139 Type 2 diabetes mellitus with other diabetic ophthalmic complication: Secondary | ICD-10-CM | POA: Diagnosis not present

## 2013-03-01 DIAGNOSIS — E109 Type 1 diabetes mellitus without complications: Secondary | ICD-10-CM | POA: Diagnosis not present

## 2013-03-01 DIAGNOSIS — Z125 Encounter for screening for malignant neoplasm of prostate: Secondary | ICD-10-CM | POA: Diagnosis not present

## 2013-03-01 DIAGNOSIS — E11329 Type 2 diabetes mellitus with mild nonproliferative diabetic retinopathy without macular edema: Secondary | ICD-10-CM | POA: Diagnosis not present

## 2013-03-12 DIAGNOSIS — B351 Tinea unguium: Secondary | ICD-10-CM | POA: Diagnosis not present

## 2013-03-12 DIAGNOSIS — C9000 Multiple myeloma not having achieved remission: Secondary | ICD-10-CM | POA: Diagnosis not present

## 2013-03-12 DIAGNOSIS — E7800 Pure hypercholesterolemia, unspecified: Secondary | ICD-10-CM | POA: Diagnosis not present

## 2013-03-12 DIAGNOSIS — E109 Type 1 diabetes mellitus without complications: Secondary | ICD-10-CM | POA: Diagnosis not present

## 2013-03-12 DIAGNOSIS — Z23 Encounter for immunization: Secondary | ICD-10-CM | POA: Diagnosis not present

## 2013-03-12 DIAGNOSIS — Z0000 Encounter for general adult medical examination without abnormal findings: Secondary | ICD-10-CM | POA: Diagnosis not present

## 2013-03-12 DIAGNOSIS — E291 Testicular hypofunction: Secondary | ICD-10-CM | POA: Diagnosis not present

## 2013-03-12 DIAGNOSIS — Z125 Encounter for screening for malignant neoplasm of prostate: Secondary | ICD-10-CM | POA: Diagnosis not present

## 2013-03-12 DIAGNOSIS — I10 Essential (primary) hypertension: Secondary | ICD-10-CM | POA: Diagnosis not present

## 2013-03-12 DIAGNOSIS — I4510 Unspecified right bundle-branch block: Secondary | ICD-10-CM | POA: Diagnosis not present

## 2013-03-15 DIAGNOSIS — Z1212 Encounter for screening for malignant neoplasm of rectum: Secondary | ICD-10-CM | POA: Diagnosis not present

## 2013-04-16 ENCOUNTER — Other Ambulatory Visit (HOSPITAL_BASED_OUTPATIENT_CLINIC_OR_DEPARTMENT_OTHER): Payer: Medicare Other | Admitting: Lab

## 2013-04-16 ENCOUNTER — Ambulatory Visit (HOSPITAL_BASED_OUTPATIENT_CLINIC_OR_DEPARTMENT_OTHER): Payer: Medicare Other | Admitting: Hematology & Oncology

## 2013-04-16 VITALS — BP 135/59 | HR 68 | Temp 98.0°F | Resp 18 | Ht 70.0 in | Wt 206.0 lb

## 2013-04-16 DIAGNOSIS — C9001 Multiple myeloma in remission: Secondary | ICD-10-CM

## 2013-04-16 DIAGNOSIS — C9000 Multiple myeloma not having achieved remission: Secondary | ICD-10-CM

## 2013-04-16 LAB — CBC WITH DIFFERENTIAL (CANCER CENTER ONLY)
BASO#: 0.1 10*3/uL (ref 0.0–0.2)
EOS%: 1.2 % (ref 0.0–7.0)
Eosinophils Absolute: 0.1 10*3/uL (ref 0.0–0.5)
LYMPH%: 27.8 % (ref 14.0–48.0)
MCH: 33.5 pg — ABNORMAL HIGH (ref 28.0–33.4)
MCHC: 34 g/dL (ref 32.0–35.9)
MCV: 99 fL — ABNORMAL HIGH (ref 82–98)
MONO%: 12.2 % (ref 0.0–13.0)
NEUT#: 3.3 10*3/uL (ref 1.5–6.5)
Platelets: 154 10*3/uL (ref 145–400)

## 2013-04-16 NOTE — Progress Notes (Signed)
This office note has been dictated.

## 2013-04-18 NOTE — Progress Notes (Signed)
CC:   Rickey [name], M.D.  DIAGNOSIS:  Kappa light chain myeloma -- clinical remission.  CURRENT THERAPY:  Observation.  INTERIM HISTORY:  Rickey [name] comes in for a 70-month followup.  He is doing well.  He and his wife went for a cruise.  They actually cruised across the [facility].  They went to [place].  As always, they brought back some stamps for me.  When we last saw him, again, he was in remission.  His serum kappa light chain was 1.72 mg/dL.  He has had no problems with his blood sugars.  He is on insulin pump. He has had no bony pain.  There has been no cough or shortness of breath.  There has been no change in bowel or bladder habits.  His overall performance status is ECOG 0.  PHYSICAL EXAMINATION:  General:  This is a well-developed, well- nourished white gentleman, in no obvious distress.  Vital Signs:  Show temperature of 98, pulse 68, respiratory rate 18, blood pressure 135/59. Weight is 206 pounds.  Head and Neck:  Show no ocular or oral lesions. There are no palpable cervical or supraclavicular lymph nodes.  He has no scleral icterus.  There is no adenopathy in the neck.  Lungs:  Clear bilaterally.  Cardiac:  Regular rate and rhythm with a normal S1 and S2. There are no murmurs, rubs, or bruits.  Abdomen:  Soft.  He has good bowel sounds.  His insulin pump is in the left lower quadrant.  There is no palpable hepatosplenomegaly.  Back:  No tenderness over the spine, ribs, or hips.  Extremities:  No clubbing, cyanosis, or edema.  He has good strength in his arms and legs.  He has good range motion of his joints.  Skin:  No rashes, ecchymoses, or petechiae.  Neurological: Shows no focal neurological deficits.  LABORATORY STUDIES:  White cell count is 5.8, hemoglobin 16.3, hematocrit 47.9, platelet count 154.  IMPRESSION:  Rickey [name] is a nice 72 year old old gentleman with kappa light chain myeloma.  He underwent induction chemotherapy followed by stem cell  transplant.  He had a stem cell transplant at [facility] in August 2010.  He then had 2 years of Revlimid for maintenance therapy.  I have noticed that there is a slight trend with the light chains.  We are going to have to watch this.  We will have to see what his light chain number is with this visit.  He is totally asymptomatic.  We will certainly follow along closely.  I think 70-month followup will be appropriate, unless we see marked changes with the light chains.    ______________________________ Rickey [name], M.D. PRE/MEDQ  D:  04/16/2013  T:  04/17/2013  Job:  [id number]

## 2013-04-20 LAB — PROTEIN ELECTROPHORESIS, SERUM, WITH REFLEX
Alpha-2-Globulin: 8.7 % (ref 7.1–11.8)
Gamma Globulin: 14 % (ref 11.1–18.8)
Total Protein, Serum Electrophoresis: 6.1 g/dL (ref 6.0–8.3)

## 2013-04-20 LAB — COMPREHENSIVE METABOLIC PANEL
AST: 22 U/L (ref 0–37)
Albumin: 4 g/dL (ref 3.5–5.2)
Alkaline Phosphatase: 45 U/L (ref 39–117)
BUN: 18 mg/dL (ref 6–23)
Glucose, Bld: 104 mg/dL — ABNORMAL HIGH (ref 70–99)
Potassium: 4.2 mEq/L (ref 3.5–5.3)
Sodium: 138 mEq/L (ref 135–145)
Total Bilirubin: 1.1 mg/dL (ref 0.3–1.2)

## 2013-04-20 LAB — KAPPA/LAMBDA LIGHT CHAINS
Kappa:Lambda Ratio: 1.28 (ref 0.26–1.65)
Lambda Free Lght Chn: 1.31 mg/dL (ref 0.57–2.63)

## 2013-04-20 LAB — IGG, IGA, IGM
IgA: 157 mg/dL (ref 68–379)
IgG (Immunoglobin G), Serum: 943 mg/dL (ref 650–1600)
IgM, Serum: 36 mg/dL — ABNORMAL LOW (ref 41–251)

## 2013-04-30 ENCOUNTER — Telehealth: Payer: Self-pay | Admitting: *Deleted

## 2013-04-30 NOTE — Telephone Encounter (Signed)
Notes Recorded by Josph Macho, MD on 04/19/2013 at 6:14 PM  Call - myeloma still in remission!! Rickey [name]   04/30/13 - Pt called inquiring about his lab results from a few weeks ago.Gave him the above message with verbalized understanding.

## 2013-06-15 DIAGNOSIS — I10 Essential (primary) hypertension: Secondary | ICD-10-CM | POA: Diagnosis not present

## 2013-06-15 DIAGNOSIS — C9000 Multiple myeloma not having achieved remission: Secondary | ICD-10-CM | POA: Diagnosis not present

## 2013-06-15 DIAGNOSIS — E1069 Type 1 diabetes mellitus with other specified complication: Secondary | ICD-10-CM | POA: Diagnosis not present

## 2013-06-15 DIAGNOSIS — Z6828 Body mass index (BMI) 28.0-28.9, adult: Secondary | ICD-10-CM | POA: Diagnosis not present

## 2013-09-20 ENCOUNTER — Telehealth: Payer: Self-pay | Admitting: Hematology & Oncology

## 2013-09-20 NOTE — Telephone Encounter (Signed)
Pt moved 5-22 to 5-19 lab and 5-20 MD

## 2013-10-12 ENCOUNTER — Other Ambulatory Visit (HOSPITAL_BASED_OUTPATIENT_CLINIC_OR_DEPARTMENT_OTHER): Payer: Medicare Other

## 2013-10-12 DIAGNOSIS — C9001 Multiple myeloma in remission: Secondary | ICD-10-CM

## 2013-10-12 DIAGNOSIS — C9000 Multiple myeloma not having achieved remission: Secondary | ICD-10-CM | POA: Diagnosis not present

## 2013-10-12 LAB — CBC WITH DIFFERENTIAL/PLATELET
BASO%: 1.1 % (ref 0.0–2.0)
Basophils Absolute: 0.1 10*3/uL (ref 0.0–0.1)
EOS%: 1.4 % (ref 0.0–7.0)
Eosinophils Absolute: 0.1 10*3/uL (ref 0.0–0.5)
HCT: 51.7 % — ABNORMAL HIGH (ref 38.4–49.9)
HGB: 17.1 g/dL (ref 13.0–17.1)
LYMPH%: 24.9 % (ref 14.0–49.0)
MCH: 33.3 pg (ref 27.2–33.4)
MCHC: 33.1 g/dL (ref 32.0–36.0)
MCV: 100.6 fL — ABNORMAL HIGH (ref 79.3–98.0)
MONO#: 0.5 10*3/uL (ref 0.1–0.9)
MONO%: 10.1 % (ref 0.0–14.0)
NEUT%: 62.5 % (ref 39.0–75.0)
NEUTROS ABS: 3.4 10*3/uL (ref 1.5–6.5)
Platelets: 173 10*3/uL (ref 140–400)
RBC: 5.14 10*6/uL (ref 4.20–5.82)
RDW: 13.3 % (ref 11.0–14.6)
WBC: 5.4 10*3/uL (ref 4.0–10.3)
lymph#: 1.4 10*3/uL (ref 0.9–3.3)

## 2013-10-12 LAB — COMPREHENSIVE METABOLIC PANEL (CC13)
ALK PHOS: 43 U/L (ref 40–150)
ALT: 26 U/L (ref 0–55)
AST: 25 U/L (ref 5–34)
Albumin: 3.8 g/dL (ref 3.5–5.0)
Anion Gap: 11 mEq/L (ref 3–11)
BUN: 19.9 mg/dL (ref 7.0–26.0)
CO2: 25 meq/L (ref 22–29)
Calcium: 9.5 mg/dL (ref 8.4–10.4)
Chloride: 105 mEq/L (ref 98–109)
Creatinine: 1.1 mg/dL (ref 0.7–1.3)
Glucose: 207 mg/dl — ABNORMAL HIGH (ref 70–140)
POTASSIUM: 4.8 meq/L (ref 3.5–5.1)
SODIUM: 141 meq/L (ref 136–145)
TOTAL PROTEIN: 6.6 g/dL (ref 6.4–8.3)
Total Bilirubin: 1.3 mg/dL — ABNORMAL HIGH (ref 0.20–1.20)

## 2013-10-13 ENCOUNTER — Ambulatory Visit (HOSPITAL_BASED_OUTPATIENT_CLINIC_OR_DEPARTMENT_OTHER): Payer: Medicare Other | Admitting: Hematology & Oncology

## 2013-10-13 ENCOUNTER — Encounter: Payer: Self-pay | Admitting: Hematology & Oncology

## 2013-10-13 VITALS — BP 126/59 | HR 71 | Temp 98.1°F | Resp 18 | Ht 72.0 in | Wt 204.0 lb

## 2013-10-13 DIAGNOSIS — N400 Enlarged prostate without lower urinary tract symptoms: Secondary | ICD-10-CM | POA: Diagnosis not present

## 2013-10-13 DIAGNOSIS — E119 Type 2 diabetes mellitus without complications: Secondary | ICD-10-CM | POA: Diagnosis not present

## 2013-10-13 DIAGNOSIS — C9000 Multiple myeloma not having achieved remission: Secondary | ICD-10-CM

## 2013-10-13 DIAGNOSIS — I10 Essential (primary) hypertension: Secondary | ICD-10-CM | POA: Diagnosis not present

## 2013-10-13 DIAGNOSIS — C9001 Multiple myeloma in remission: Secondary | ICD-10-CM

## 2013-10-13 DIAGNOSIS — E109 Type 1 diabetes mellitus without complications: Secondary | ICD-10-CM | POA: Diagnosis not present

## 2013-10-13 DIAGNOSIS — Z6828 Body mass index (BMI) 28.0-28.9, adult: Secondary | ICD-10-CM | POA: Diagnosis not present

## 2013-10-13 DIAGNOSIS — Z23 Encounter for immunization: Secondary | ICD-10-CM | POA: Diagnosis not present

## 2013-10-13 NOTE — Progress Notes (Signed)
Hematology and Oncology Follow Up Visit  Rickey [name] [id number] [birth year] 73 y.o. 10/13/2013   Principle Diagnosis:   Kappa light chain myeloma-clinical remission  Current Therapy:    Observation     Interim History:  Rickey [name] is back for followup. We see very 6 months. He's been incredibly well. He has diabetes which is his biggest problem. However, this is appears be under very good control.  He now is been 5 years since his stem cell transplant. He had this at [facility] in August of 2010.  He and his wife will resolve her in their 50th anniversary this August. Then he'll going on a trip out [place] for about a month with friends from [place].  His last light chain was 1.68 back in November.  Everything has looked good otherwise. He's had no bony lesions. He's had no infectious problems. His IgG level is excellent at 886 mg/dL.  Medications: Current outpatient prescriptions:amLODipine (NORVASC) 2.5 MG tablet, Take 2.5 mg by mouth daily. , Disp: , Rfl: ;  aspirin 81 MG tablet, Take 81 mg by mouth daily.  , Disp: , Rfl: ;  atorvastatin (LIPITOR) 10 MG tablet, Take 10 mg by mouth daily.  , Disp: , Rfl: ;  calcium-vitamin D (OSCAL) 250-125 MG-UNIT per tablet, Take 1 tablet by mouth 2 (two) times daily.  , Disp: , Rfl:  Cholecalciferol (VITAMIN D) 2000 UNITS CAPS, Take by mouth every morning., Disp: , Rfl: ;  cycloSPORINE (RESTASIS) 0.05 % ophthalmic emulsion, 1 drop 2 (two) times daily.  , Disp: , Rfl: ;  fish oil-omega-3 fatty acids 1000 MG capsule, Take 2 g by mouth 2 (two) times daily. , Disp: , Rfl: ;  insulin lispro (HUMALOG) 100 UNIT/ML injection, Inject into the skin. Insulin pump, Disp: , Rfl:  montelukast (SINGULAIR) 10 MG tablet, Take 10 mg by mouth at bedtime.  , Disp: , Rfl: ;  telmisartan-hydrochlorothiazide (MICARDIS HCT) 80-12.5 MG per tablet, Take 1 tablet by mouth daily. , Disp: , Rfl: ;  zolpidem (AMBIEN) 10 MG tablet, Take 10 mg by mouth at bedtime as needed.  , Disp:  , Rfl:   Allergies: No Known Allergies  Past Medical History, Surgical history, Social history, and Family History were reviewed and updated.  Review of Systems: As above  Physical Exam:  height is 6' (1.829 m) and weight is 204 lb (92.534 kg). His oral temperature is 98.1 F (36.7 C). His blood pressure is 126/59 and his pulse is 71. His respiration is 18.   Lungs are clear. Cardiac exam regular in rhythm. Abdomen soft. Has good bowel sounds. There is no palpable abdominal mass. There is a palpable liver or spleen. Extremities shows no clubbing cyanosis or edema. Skin exam no rashes ecchymosis or petechia. And exam shows no ocular or oral lesions. Neurological exam is nonfocal.  Lab Results  Component Value Date   WBC 5.8 04/16/2013   HGB 16.3 04/16/2013   HCT 47.9 04/16/2013   MCV 99* 04/16/2013   PLT 154 04/16/2013     Chemistry      Component Value Date/Time   NA 141 10/12/2013 1156   NA 138 04/16/2013 1133   NA 143 12/08/2008 1131   K 4.8 10/12/2013 1156   K 4.2 04/16/2013 1133   K 4.1 12/08/2008 1131   CL 103 04/16/2013 1133   CL 103 12/08/2008 1131   CO2 25 10/12/2013 1156   CO2 27 04/16/2013 1133   CO2 31 12/08/2008 1131  BUN 19.9 10/12/2013 1156   BUN 18 04/16/2013 1133   BUN 16 12/08/2008 1131   CREATININE 1.1 10/12/2013 1156   CREATININE 1.12 04/16/2013 1133   CREATININE 1.1 12/08/2008 1131      Component Value Date/Time   CALCIUM 9.5 10/12/2013 1156   CALCIUM 8.9 04/16/2013 1133   CALCIUM 8.9 12/08/2008 1131   ALKPHOS 43 10/12/2013 1156   ALKPHOS 45 04/16/2013 1133   ALKPHOS 99* 09/06/2008 0941   AST 25 10/12/2013 1156   AST 22 04/16/2013 1133   AST 24 09/06/2008 0941   ALT 26 10/12/2013 1156   ALT 26 04/16/2013 1133   ALT 27 09/06/2008 0941   BILITOT 1.30* 10/12/2013 1156   BILITOT 1.1 04/16/2013 1133   BILITOT 0.70 09/06/2008 0941         Impression and Plan: Rickey [name] is a 73 year old gentleman with kappa light chain myeloma. He got into a remission  currently quickly. We then got him to transplant. It will be 5 years in August that has had his transplant. He was on a maintenance Revlimid and tolerated this well.  Again, and blood sugars will be the biggest issue for him. However, these appear to be well controlled.  We will plan for another followup in 6 months.  I do not see any for any additional labs or x-rays studies.  [name], MD 5/20/20158:51 AM

## 2013-10-14 ENCOUNTER — Telehealth: Payer: Self-pay | Admitting: *Deleted

## 2013-10-14 NOTE — Telephone Encounter (Addendum)
Message copied by Lenn Sink on Thu Oct 14, 2013 10:17 AM ------      Message from: [name]      Created: Wed Oct 13, 2013  6:35 PM       Please call to let him know the myeloma is still in remission. Thanks. Rickey [name] ------Informed pt that myeloma is still in remission!

## 2013-10-15 ENCOUNTER — Ambulatory Visit: Payer: Medicare Other | Admitting: Hematology & Oncology

## 2013-10-15 ENCOUNTER — Other Ambulatory Visit: Payer: Medicare Other | Admitting: Lab

## 2013-10-15 LAB — PROTEIN ELECTROPHORESIS, SERUM, WITH REFLEX
ALBUMIN ELP: 63.6 % (ref 55.8–66.1)
ALPHA-2-GLOBULIN: 8.8 % (ref 7.1–11.8)
Alpha-1-Globulin: 4.2 % (ref 2.9–4.9)
Beta 2: 4.1 % (ref 3.2–6.5)
Beta Globulin: 6 % (ref 4.7–7.2)
Gamma Globulin: 13.3 % (ref 11.1–18.8)
TOTAL PROTEIN, SERUM ELECTROPHOR: 6.5 g/dL (ref 6.0–8.3)

## 2013-10-15 LAB — IGG, IGA, IGM
IGA: 152 mg/dL (ref 68–379)
IGG (IMMUNOGLOBIN G), SERUM: 886 mg/dL (ref 650–1600)
IgM, Serum: 32 mg/dL — ABNORMAL LOW (ref 41–251)

## 2013-10-15 LAB — KAPPA/LAMBDA LIGHT CHAINS
Kappa free light chain: 2.33 mg/dL — ABNORMAL HIGH (ref 0.33–1.94)
Kappa:Lambda Ratio: 2.51 — ABNORMAL HIGH (ref 0.26–1.65)
Lambda Free Lght Chn: 0.93 mg/dL (ref 0.57–2.63)

## 2014-01-12 DIAGNOSIS — I10 Essential (primary) hypertension: Secondary | ICD-10-CM | POA: Diagnosis not present

## 2014-01-12 DIAGNOSIS — E109 Type 1 diabetes mellitus without complications: Secondary | ICD-10-CM | POA: Diagnosis not present

## 2014-01-12 DIAGNOSIS — Z6828 Body mass index (BMI) 28.0-28.9, adult: Secondary | ICD-10-CM | POA: Diagnosis not present

## 2014-03-09 DIAGNOSIS — Z23 Encounter for immunization: Secondary | ICD-10-CM | POA: Diagnosis not present

## 2014-04-07 ENCOUNTER — Ambulatory Visit (HOSPITAL_BASED_OUTPATIENT_CLINIC_OR_DEPARTMENT_OTHER): Payer: Medicare Other | Admitting: Hematology & Oncology

## 2014-04-07 ENCOUNTER — Other Ambulatory Visit (HOSPITAL_BASED_OUTPATIENT_CLINIC_OR_DEPARTMENT_OTHER): Payer: Medicare Other | Admitting: Lab

## 2014-04-07 VITALS — BP 124/59 | HR 66 | Resp 18

## 2014-04-07 DIAGNOSIS — C9001 Multiple myeloma in remission: Secondary | ICD-10-CM | POA: Diagnosis not present

## 2014-04-07 DIAGNOSIS — C9000 Multiple myeloma not having achieved remission: Secondary | ICD-10-CM

## 2014-04-07 DIAGNOSIS — E119 Type 2 diabetes mellitus without complications: Secondary | ICD-10-CM

## 2014-04-07 LAB — CBC WITH DIFFERENTIAL (CANCER CENTER ONLY)
BASO#: 0.1 10*3/uL (ref 0.0–0.2)
BASO%: 0.8 % (ref 0.0–2.0)
EOS ABS: 0.1 10*3/uL (ref 0.0–0.5)
EOS%: 1.8 % (ref 0.0–7.0)
HEMATOCRIT: 50.3 % — AB (ref 38.7–49.9)
HEMOGLOBIN: 17.4 g/dL — AB (ref 13.0–17.1)
LYMPH#: 1.7 10*3/uL (ref 0.9–3.3)
LYMPH%: 28.2 % (ref 14.0–48.0)
MCH: 33.7 pg — ABNORMAL HIGH (ref 28.0–33.4)
MCHC: 34.6 g/dL (ref 32.0–35.9)
MCV: 98 fL (ref 82–98)
MONO#: 0.8 10*3/uL (ref 0.1–0.9)
MONO%: 12.7 % (ref 0.0–13.0)
NEUT#: 3.4 10*3/uL (ref 1.5–6.5)
NEUT%: 56.5 % (ref 40.0–80.0)
Platelets: 175 10*3/uL (ref 145–400)
RBC: 5.16 10*6/uL (ref 4.20–5.70)
RDW: 12.8 % (ref 11.1–15.7)
WBC: 6 10*3/uL (ref 4.0–10.0)

## 2014-04-07 LAB — CMP (CANCER CENTER ONLY)
ALBUMIN: 3.5 g/dL (ref 3.3–5.5)
ALT(SGPT): 19 U/L (ref 10–47)
AST: 20 U/L (ref 11–38)
Alkaline Phosphatase: 44 U/L (ref 26–84)
BUN: 13 mg/dL (ref 7–22)
CALCIUM: 8.8 mg/dL (ref 8.0–10.3)
CO2: 28 meq/L (ref 18–33)
CREATININE: 1 mg/dL (ref 0.6–1.2)
Chloride: 101 mEq/L (ref 98–108)
GLUCOSE: 76 mg/dL (ref 73–118)
POTASSIUM: 3.8 meq/L (ref 3.3–4.7)
Sodium: 141 mEq/L (ref 128–145)
Total Bilirubin: 1.1 mg/dl (ref 0.20–1.60)
Total Protein: 6.5 g/dL (ref 6.4–8.1)

## 2014-04-08 ENCOUNTER — Other Ambulatory Visit: Payer: Medicare Other | Admitting: Lab

## 2014-04-08 ENCOUNTER — Ambulatory Visit: Payer: Medicare Other | Admitting: Hematology & Oncology

## 2014-04-08 DIAGNOSIS — I10 Essential (primary) hypertension: Secondary | ICD-10-CM | POA: Diagnosis not present

## 2014-04-08 DIAGNOSIS — Z125 Encounter for screening for malignant neoplasm of prostate: Secondary | ICD-10-CM | POA: Diagnosis not present

## 2014-04-08 DIAGNOSIS — Z008 Encounter for other general examination: Secondary | ICD-10-CM | POA: Diagnosis not present

## 2014-04-08 DIAGNOSIS — E109 Type 1 diabetes mellitus without complications: Secondary | ICD-10-CM | POA: Diagnosis not present

## 2014-04-08 DIAGNOSIS — J302 Other seasonal allergic rhinitis: Secondary | ICD-10-CM | POA: Diagnosis not present

## 2014-04-08 DIAGNOSIS — C9000 Multiple myeloma not having achieved remission: Secondary | ICD-10-CM | POA: Diagnosis not present

## 2014-04-08 DIAGNOSIS — Z1389 Encounter for screening for other disorder: Secondary | ICD-10-CM | POA: Diagnosis not present

## 2014-04-08 DIAGNOSIS — N400 Enlarged prostate without lower urinary tract symptoms: Secondary | ICD-10-CM | POA: Diagnosis not present

## 2014-04-08 DIAGNOSIS — E780 Pure hypercholesterolemia: Secondary | ICD-10-CM | POA: Diagnosis not present

## 2014-04-08 DIAGNOSIS — E291 Testicular hypofunction: Secondary | ICD-10-CM | POA: Diagnosis not present

## 2014-04-11 LAB — PROTEIN ELECTROPHORESIS, SERUM, WITH REFLEX
ALPHA-1-GLOBULIN: 4.5 % (ref 2.9–4.9)
Albumin ELP: 60.8 % (ref 55.8–66.1)
Alpha-2-Globulin: 9.6 % (ref 7.1–11.8)
BETA 2: 5.3 % (ref 3.2–6.5)
BETA GLOBULIN: 5.8 % (ref 4.7–7.2)
GAMMA GLOBULIN: 14 % (ref 11.1–18.8)
Total Protein, Serum Electrophoresis: 6.2 g/dL (ref 6.0–8.3)

## 2014-04-11 LAB — IGG, IGA, IGM
IGM, SERUM: 40 mg/dL — AB (ref 41–251)
IgA: 168 mg/dL (ref 68–379)
IgG (Immunoglobin G), Serum: 859 mg/dL (ref 650–1600)

## 2014-04-11 LAB — LACTATE DEHYDROGENASE: LDH: 141 U/L (ref 94–250)

## 2014-04-11 LAB — KAPPA/LAMBDA LIGHT CHAINS
KAPPA FREE LGHT CHN: 21.8 mg/dL — AB (ref 0.33–1.94)
Kappa:Lambda Ratio: 18.63 — ABNORMAL HIGH (ref 0.26–1.65)
LAMBDA FREE LGHT CHN: 1.17 mg/dL (ref 0.57–2.63)

## 2014-04-11 NOTE — Progress Notes (Signed)
Hematology and Oncology Follow Up Visit  Rickey [name] [id number] 01-06-1941 73 y.o. 04/11/2014   Principle Diagnosis:   Kappa light chain myeloma-clinical remission  Current Therapy:    Observation     Interim History:  [name] is back for followup. We see very 6 months. He's been incredibly well. He has diabetes which is his biggest problem. However, this is appears be under very good control.  He now is been 5 years since his stem cell transplant. He had this at [facility] in August of 2010.  He and his wife had a great time this summer. Some friends there is a came over from [place]. They spend a few weeks out [place]. They did a lot of driving. They really enjoy themselves.  He's had no problems with pain. He's had a problem with infections. He's had no cough or shortness of breath.   His last light chain was 2.33 mg/dL back in may  Everything has looked good otherwise. He's had no bony lesions. He's had no infectious problems. His IgG level was excellent at 886 mg/dL we saw him back in May.  Medications: Current outpatient prescriptions: amLODipine (NORVASC) 2.5 MG tablet, Take 2.5 mg by mouth daily. , Disp: , Rfl: ;  aspirin 81 MG tablet, Take 81 mg by mouth daily.  , Disp: , Rfl: ;  atorvastatin (LIPITOR) 10 MG tablet, Take 10 mg by mouth daily.  , Disp: , Rfl: ;  calcium-vitamin D (OSCAL) 250-125 MG-UNIT per tablet, Take 1 tablet by mouth daily. , Disp: , Rfl:  Cholecalciferol (VITAMIN D) 2000 UNITS CAPS, Take by mouth every morning., Disp: , Rfl: ;  cycloSPORINE (RESTASIS) 0.05 % ophthalmic emulsion, 1 drop 2 (two) times daily.  , Disp: , Rfl: ;  fish oil-omega-3 fatty acids 1000 MG capsule, Take 2 g by mouth 2 (two) times daily. , Disp: , Rfl: ;  insulin lispro (HUMALOG) 100 UNIT/ML injection, Inject into the skin. Insulin pump, Disp: , Rfl:  montelukast (SINGULAIR) 10 MG tablet, Take 10 mg by mouth at bedtime.  , Disp: , Rfl: ;  telmisartan-hydrochlorothiazide (MICARDIS  HCT) 80-12.5 MG per tablet, Take 1 tablet by mouth daily. , Disp: , Rfl:   Allergies: No Known Allergies  Past Medical History, Surgical history, Social history, and Family History were reviewed and updated.  Review of Systems: As above  Physical Exam:  blood pressure is 124/59 and his pulse is 66. His respiration is 18.   Head and neck exam shows no ocular or oral lesions. He has no palpable cervical or supraclavicular lymph nodes. Thyroid is nonpalpable.Lungs are clear. Cardiac exam regular rate and rhythm with a normal S1 and S2. There are no murmurs, rubs or bruits. . Abdomen is soft. Has good bowel sounds. There is no palpable abdominal mass. There is no palpable liver or spleen. Extremities shows no clubbing cyanosis or edema. Skin exam no rashes ecchymosis or petechia.  Neurological exam is nonfocal.  Lab Results  Component Value Date   WBC 6.0 04/07/2014   HGB 17.4* 04/07/2014   HCT 50.3* 04/07/2014   MCV 98 04/07/2014   PLT 175 04/07/2014     Chemistry      Component Value Date/Time   NA 141 04/07/2014 1337   NA 141 10/12/2013 1156   NA 138 04/16/2013 1133   K 3.8 04/07/2014 1337   K 4.8 10/12/2013 1156   K 4.2 04/16/2013 1133   CL 101 04/07/2014 1337   CL 103  04/16/2013 1133   CO2 28 04/07/2014 1337   CO2 25 10/12/2013 1156   CO2 27 04/16/2013 1133   BUN 13 04/07/2014 1337   BUN 19.9 10/12/2013 1156   BUN 18 04/16/2013 1133   CREATININE 1.0 04/07/2014 1337   CREATININE 1.1 10/12/2013 1156   CREATININE 1.12 04/16/2013 1133      Component Value Date/Time   CALCIUM 8.8 04/07/2014 1337   CALCIUM 9.5 10/12/2013 1156   CALCIUM 8.9 04/16/2013 1133   ALKPHOS 44 04/07/2014 1337   ALKPHOS 43 10/12/2013 1156   ALKPHOS 45 04/16/2013 1133   AST 20 04/07/2014 1337   AST 25 10/12/2013 1156   AST 22 04/16/2013 1133   ALT 19 04/07/2014 1337   ALT 26 10/12/2013 1156   ALT 26 04/16/2013 1133   BILITOT 1.10 04/07/2014 1337   BILITOT 1.30* 10/12/2013 1156   BILITOT 1.1  04/16/2013 1133     Kappa light chain is 21.8 mg/dL.    Impression and Plan: Rickey [name] is a 73 year old gentleman with kappa light chain myeloma. He got into a remission currently quickly. We then got him to transplant.   It has been 5 years since transplant.  unfortunately, I have a sense that his myeloma might be trying to come back. His light chain is certainly much higher now.  I will have to do a 24-hour urine on him. I probably will also get a bone survey.  If the urine does show a significant amount of light chain, that think a bone marrow biopsy will be next.  I spoke to him on the phone about this. He understands what might be going on. He understands that if the myeloma is back, that we certainly can treat this again.  I probably will be seeing him earlier than the normal 6 months.  Again, a lot will depend on what his urine test shows and his bone survey. [name], MD 11/16/20155:28 PM

## 2014-04-12 ENCOUNTER — Telehealth: Payer: Self-pay | Admitting: Hematology & Oncology

## 2014-04-12 NOTE — Telephone Encounter (Signed)
Pt aware of Thursday appointment

## 2014-04-13 ENCOUNTER — Other Ambulatory Visit: Payer: Medicare Other | Admitting: Lab

## 2014-04-13 ENCOUNTER — Ambulatory Visit: Payer: Medicare Other | Admitting: Hematology & Oncology

## 2014-04-14 ENCOUNTER — Other Ambulatory Visit: Payer: Self-pay | Admitting: Hematology & Oncology

## 2014-04-14 ENCOUNTER — Ambulatory Visit (HOSPITAL_BASED_OUTPATIENT_CLINIC_OR_DEPARTMENT_OTHER)
Admission: RE | Admit: 2014-04-14 | Discharge: 2014-04-14 | Disposition: A | Discharge status: Home / Self Care | Payer: Medicare Other | Source: Ambulatory Visit | Attending: Hematology & Oncology | Admitting: Hematology & Oncology

## 2014-04-14 ENCOUNTER — Other Ambulatory Visit: Payer: Medicare Other | Admitting: Lab

## 2014-04-14 DIAGNOSIS — C9002 Multiple myeloma in relapse: Secondary | ICD-10-CM | POA: Diagnosis not present

## 2014-04-14 DIAGNOSIS — Z9221 Personal history of antineoplastic chemotherapy: Secondary | ICD-10-CM | POA: Diagnosis not present

## 2014-04-14 DIAGNOSIS — C9000 Multiple myeloma not having achieved remission: Secondary | ICD-10-CM | POA: Diagnosis not present

## 2014-04-15 ENCOUNTER — Telehealth: Payer: Self-pay | Admitting: Hematology & Oncology

## 2014-04-15 NOTE — Telephone Encounter (Signed)
Pt aware of 12-8 BMBX, PET and to be NPO after midnight and he needs driver. Per scheduling I paged Dr. Leonia Reeves in radiology about pt's insulin pump [phone]). Pt aware I will call him back

## 2014-04-15 NOTE — Telephone Encounter (Signed)
Per Dr. Leonia Reeves pt can keep insulin pump on but not to give bolus and to be NPO. Pt and [place] in [place] aware

## 2014-04-19 DIAGNOSIS — L738 Other specified follicular disorders: Secondary | ICD-10-CM | POA: Diagnosis not present

## 2014-04-19 DIAGNOSIS — L814 Other melanin hyperpigmentation: Secondary | ICD-10-CM | POA: Diagnosis not present

## 2014-04-19 DIAGNOSIS — L570 Actinic keratosis: Secondary | ICD-10-CM | POA: Diagnosis not present

## 2014-04-19 DIAGNOSIS — C44321 Squamous cell carcinoma of skin of nose: Secondary | ICD-10-CM | POA: Diagnosis not present

## 2014-04-19 DIAGNOSIS — D485 Neoplasm of uncertain behavior of skin: Secondary | ICD-10-CM | POA: Diagnosis not present

## 2014-04-20 ENCOUNTER — Other Ambulatory Visit: Payer: Medicare Other | Admitting: Lab

## 2014-04-20 DIAGNOSIS — C9000 Multiple myeloma not having achieved remission: Secondary | ICD-10-CM | POA: Diagnosis not present

## 2014-04-22 DIAGNOSIS — Z1212 Encounter for screening for malignant neoplasm of rectum: Secondary | ICD-10-CM | POA: Diagnosis not present

## 2014-04-25 LAB — UIFE/LIGHT CHAINS/TP QN, 24-HR UR
Albumin, U: DETECTED
Alpha 1, Urine: DETECTED — AB
Alpha 2, Urine: DETECTED — AB
Beta, Urine: DETECTED — AB
Gamma Globulin, Urine: DETECTED — AB
TIME-UPE24: 24 h
TOTAL PROTEIN, URINE-UPE24: 5 mg/dL (ref 5–25)
Total Protein, Urine-Ur/day: 140 mg/d (ref <150)
Volume, Urine: 2800 mL

## 2014-04-25 LAB — KAPPA/LAMBDA LIGHT CHAINS, FREE, WITH RATIO, 24HR. URINE
KAPPA LIGHT CHAIN, FREE U: 7.23 mg/L (ref 1.35–24.19)
KAPPA/LAMBDA, FREE RATIO: 13.39 — AB (ref 2.04–10.37)
Lambda Light Chain, Free U: 0.54 mg/L (ref 0.24–6.66)

## 2014-04-29 ENCOUNTER — Other Ambulatory Visit: Payer: Self-pay | Admitting: Hematology & Oncology

## 2014-04-29 DIAGNOSIS — C9002 Multiple myeloma in relapse: Secondary | ICD-10-CM

## 2014-05-02 ENCOUNTER — Other Ambulatory Visit: Payer: Self-pay | Admitting: Hematology & Oncology

## 2014-05-03 ENCOUNTER — Ambulatory Visit (HOSPITAL_COMMUNITY)
Admission: RE | Admit: 2014-05-03 | Discharge: 2014-05-03 | Disposition: A | Discharge status: Home / Self Care | Payer: Medicare Other | Source: Ambulatory Visit | Attending: Hematology & Oncology | Admitting: Hematology & Oncology

## 2014-05-03 ENCOUNTER — Encounter (HOSPITAL_COMMUNITY): Payer: Self-pay

## 2014-05-03 VITALS — BP 120/54 | HR 87 | Temp 97.5°F | Resp 18 | Ht 73.0 in | Wt 203.0 lb

## 2014-05-03 DIAGNOSIS — C9001 Multiple myeloma in remission: Secondary | ICD-10-CM

## 2014-05-03 DIAGNOSIS — C9002 Multiple myeloma in relapse: Secondary | ICD-10-CM | POA: Diagnosis not present

## 2014-05-03 DIAGNOSIS — Z8579 Personal history of other malignant neoplasms of lymphoid, hematopoietic and related tissues: Secondary | ICD-10-CM | POA: Diagnosis not present

## 2014-05-03 DIAGNOSIS — Z9484 Stem cells transplant status: Secondary | ICD-10-CM | POA: Insufficient documentation

## 2014-05-03 DIAGNOSIS — C9000 Multiple myeloma not having achieved remission: Secondary | ICD-10-CM

## 2014-05-03 DIAGNOSIS — D704 Cyclic neutropenia: Secondary | ICD-10-CM | POA: Diagnosis not present

## 2014-05-03 DIAGNOSIS — E119 Type 2 diabetes mellitus without complications: Secondary | ICD-10-CM | POA: Diagnosis not present

## 2014-05-03 DIAGNOSIS — Z9641 Presence of insulin pump (external) (internal): Secondary | ICD-10-CM | POA: Diagnosis not present

## 2014-05-03 LAB — GLUCOSE, CAPILLARY
Glucose-Capillary: 95 mg/dL (ref 70–99)
Glucose-Capillary: 149 mg/dL — ABNORMAL HIGH (ref 70–99)

## 2014-05-03 LAB — CBC WITH DIFFERENTIAL/PLATELET
BASOS PCT: 1 % (ref 0–1)
Basophils Absolute: 0 10*3/uL (ref 0.0–0.1)
Eosinophils Absolute: 0.1 10*3/uL (ref 0.0–0.7)
Eosinophils Relative: 2 % (ref 0–5)
HCT: 46 % (ref 39.0–52.0)
HEMOGLOBIN: 15.3 g/dL (ref 13.0–17.0)
LYMPHS ABS: 1 10*3/uL (ref 0.7–4.0)
Lymphocytes Relative: 14 % (ref 12–46)
MCH: 32.4 pg (ref 26.0–34.0)
MCHC: 33.3 g/dL (ref 30.0–36.0)
MCV: 97.5 fL (ref 78.0–100.0)
MONOS PCT: 7 % (ref 3–12)
Monocytes Absolute: 0.5 10*3/uL (ref 0.1–1.0)
NEUTROS ABS: 5.3 10*3/uL (ref 1.7–7.7)
Neutrophils Relative %: 76 % (ref 43–77)
Platelets: 146 10*3/uL — ABNORMAL LOW (ref 150–400)
RBC: 4.72 MIL/uL (ref 4.22–5.81)
RDW: 12.9 % (ref 11.5–15.5)
WBC: 6.9 10*3/uL (ref 4.0–10.5)

## 2014-05-03 LAB — BONE MARROW EXAM

## 2014-05-03 MED ORDER — MEPERIDINE HCL 50 MG/ML IJ SOLN
25.0000 mg | Freq: Once | INTRAMUSCULAR | Status: DC
  Filled 2014-05-03: qty 1

## 2014-05-03 MED ORDER — SODIUM CHLORIDE 0.9 % IV SOLN
INTRAVENOUS | Status: DC
  Administered 2014-05-03: 500 mL via INTRAVENOUS

## 2014-05-03 MED ORDER — FLUDEOXYGLUCOSE F - 18 (FDG) INJECTION
10.0000 | Freq: Once | INTRAVENOUS | Status: AC | PRN
  Administered 2014-05-03: 10 via INTRAVENOUS

## 2014-05-03 MED ORDER — MIDAZOLAM HCL 10 MG/2ML IJ SOLN
5.0000 mg | Freq: Once | INTRAMUSCULAR | Status: DC
  Filled 2014-05-03: qty 2

## 2014-05-03 MED ORDER — MIDAZOLAM HCL 5 MG/5ML IJ SOLN
INTRAMUSCULAR | Status: AC | PRN
  Administered 2014-05-03: 1 mg via INTRAVENOUS
  Administered 2014-05-03: 2.5 mg via INTRAVENOUS

## 2014-05-03 MED ORDER — MEPERIDINE HCL 25 MG/ML IJ SOLN
INTRAMUSCULAR | Status: AC | PRN
  Administered 2014-05-03 (×2): 12.5 mg via INTRAVENOUS

## 2014-05-03 NOTE — Sedation Documentation (Signed)
Dressing CDI 

## 2014-05-03 NOTE — Sedation Documentation (Signed)
Dressing left post iliac area CDI

## 2014-05-03 NOTE — Sedation Documentation (Signed)
Medication dose calculated and verified for: VERSED 3.5mg  IV and DEMEROL 25mg  IV

## 2014-05-03 NOTE — Sedation Documentation (Signed)
Patient is resting comfortably. 

## 2014-05-03 NOTE — Sedation Documentation (Signed)
Procedure ends . Dressing to left post iliac area with gauze and tegderm. Pt placed supine.

## 2014-05-03 NOTE — Progress Notes (Addendum)
Pt arrived today for his Bone Marrow Biopsy by Dr Marin Olp. He wears an insulin pump. He states he checked his blood sugar this AM and it was 62 and he turned off his insulin pump Currently he states he feels fine warm and dry. Blood sugar checked now while in Short Stay and it was 95. Insulin pump remains off for this procedure

## 2014-05-03 NOTE — Procedures (Signed)
This is a bone marrow biopsy and aspirate procedure note for Mr. Rickey [name].  He was brought to the short stay unit at [facility] He had an IV placed peripherally.  We did the appropriate timeout procedure. This was done at 8:00 AM.  His Mallimpati score was 2. His ASA class was 2.  We then placed him onto his right side. The left posterior crest was prepped and draped in sterile fashion.  He received a total of 3.5 mg of Versed and 25 mg of Demerol, both given IV, for sedation.  We infiltrated the skin and the periosteum with 5 mL of 1% lidocaine. We then used a scalpel to make an incision into the skin.  We then used the biopsy needle to obtain an aspirate. This was difficult. However, I think we may have gotten some reliable aspirate.  We then obtained a bone marrow biopsy core. This was an excellent specimen.  I dressed and cleaning the procedure site sterilely.  He tolerated the procedure well. There were no complications.  Laurey Arrow

## 2014-05-03 NOTE — Sedation Documentation (Signed)
Patient denies pain and is resting comfortably.  

## 2014-05-03 NOTE — Sedation Documentation (Signed)
Family updated as to patient's status.

## 2014-05-03 NOTE — Discharge Instructions (Signed)
Bone Marrow Aspiration, Bone Marrow Biopsy °Care After °Read the instructions outlined below and refer to this sheet in the next few weeks. These discharge instructions provide you with general information on caring for yourself after you leave the hospital. Your caregiver may also give you specific instructions. While your treatment has been planned according to the most current medical practices available, unavoidable complications occasionally occur. If you have any problems or questions after discharge, call your caregiver. °FINDING OUT THE RESULTS OF YOUR TEST °Not all test results are available during your visit. If your test results are not back during the visit, make an appointment with your caregiver to find out the results. Do not assume everything is normal if you have not heard from your caregiver or the medical facility. It is important for you to follow up on all of your test results.  °HOME CARE INSTRUCTIONS  °You have had sedation and may be sleepy or dizzy. Your thinking may not be as clear as usual. For the next 24 hours: °· Only take over-the-counter or prescription medicines for pain, discomfort, and or fever as directed by your caregiver. °· Do not drink alcohol. °· Do not smoke. °· Do not drive. °· Do not make important legal decisions. °· Do not operate heavy machinery. °· Do not care for small children by yourself. °· Keep your dressing clean and dry. You may replace dressing with a bandage after 24 hours. °· You may take a bath or shower after 24 hours. °· Use an ice pack for 20 minutes every 2 hours while awake for pain as needed. °SEEK MEDICAL CARE IF:  °· There is redness, swelling, or increasing pain at the biopsy site. °· There is pus coming from the biopsy site. °· There is drainage from a biopsy site lasting longer than one day. °· An unexplained oral temperature above 102° F (38.9° C) develops. °SEEK IMMEDIATE MEDICAL CARE IF:  °· You develop a rash. °· You have difficulty  breathing. °· You develop any reaction or side effects to medications given. °Document Released: 11/30/2004 Document Revised: 08/05/2011 Document Reviewed: 05/10/2008 °ExitCare® Patient Information ©2015 [employer]. This information is not intended to replace advice given to you by your health care provider. Make sure you discuss any questions you have with your health care provider. °Conscious Sedation °Sedation is the use of medicines to promote relaxation and relieve discomfort and anxiety. Conscious sedation is a type of sedation. Under conscious sedation you are less alert than normal but are still able to respond to instructions or stimulation. Conscious sedation is used during short medical and dental procedures. It is milder than deep sedation or general anesthesia and allows you to return to your regular activities sooner.  °LET YOUR HEALTH CARE PROVIDER KNOW ABOUT:  °· Any allergies you have. °· All medicines you are taking, including vitamins, herbs, eye drops, creams, and over-the-counter medicines. °· Use of steroids (by mouth or creams). °· Previous problems you or members of your family have had with the use of anesthetics. °· Any blood disorders you have. °· Previous surgeries you have had. °· Medical conditions you have. °· Possibility of pregnancy, if this applies. °· Use of cigarettes, alcohol, or illegal drugs. °RISKS AND COMPLICATIONS °Generally, this is a safe procedure. However, as with any procedure, problems can occur. Possible problems include: °· Oversedation. °· Trouble breathing on your own. You may need to have a breathing tube until you are awake and breathing on your own. °· Allergic reaction   to any of the medicines used for the procedure. °BEFORE THE PROCEDURE °· You may have blood tests done. These tests can help show how well your kidneys and liver are working. They can also show how well your blood clots. °· A physical exam will be done.   °· Only take medicines as directed by  your health care provider. You may need to stop taking medicines (such as blood thinners, aspirin, or nonsteroidal anti-inflammatory drugs) before the procedure.   °· Do not eat or drink at least 6 hours before the procedure or as directed by your health care provider. °· Arrange for a responsible adult, family member, or friend to take you home after the procedure. He or she should stay with you for at least 24 hours after the procedure, until the medicine has worn off. °PROCEDURE  °· An intravenous (IV) catheter will be inserted into one of your veins. Medicine will be able to flow directly into your body through this catheter. You may be given medicine through this tube to help prevent pain and help you relax. °· The medical or dental procedure will be done. °AFTER THE PROCEDURE °· You will stay in a recovery area until the medicine has worn off. Your blood pressure and pulse will be checked.   °·  Depending on the procedure you had, you may be allowed to go home when you can tolerate liquids and your pain is under control. °Document Released: 02/05/2001 Document Revised: 05/18/2013 Document Reviewed: 01/18/2013 °ExitCare® Patient Information ©2015 [employer]. This information is not intended to replace advice given to you by your health care provider. Make sure you discuss any questions you have with your health care provider. ° °

## 2014-05-03 NOTE — Sedation Documentation (Signed)
Bloos sugar at this time is 149 and pt states he will reconnect his insulin pump. Pt ambulated in room with no assist and tolerated this well. Spoke with Monica Martinez and [place] in Nuclear Medicine as patient is scheduled for a PET scan today and they can now take patient for the PET pt has remained NPO and . Saline well was left in place left forearm. Pt taken to Radiology waiting room to register via w/c accompanied by staff and wife.

## 2014-05-04 DIAGNOSIS — C44321 Squamous cell carcinoma of skin of nose: Secondary | ICD-10-CM | POA: Diagnosis not present

## 2014-05-06 ENCOUNTER — Other Ambulatory Visit: Payer: Self-pay | Admitting: Hematology & Oncology

## 2014-05-06 DIAGNOSIS — C9000 Multiple myeloma not having achieved remission: Secondary | ICD-10-CM

## 2014-05-10 ENCOUNTER — Telehealth: Payer: Self-pay | Admitting: Hematology & Oncology

## 2014-05-10 NOTE — Telephone Encounter (Signed)
Mailed 06-2014 schedule

## 2014-05-11 ENCOUNTER — Encounter: Payer: Self-pay | Admitting: Hematology & Oncology

## 2014-05-11 LAB — CHROMOSOME ANALYSIS, BONE MARROW

## 2014-05-11 LAB — TISSUE HYBRIDIZATION (BONE MARROW)-NCBH

## 2014-05-16 ENCOUNTER — Encounter: Payer: Self-pay | Admitting: Hematology & Oncology

## 2014-07-14 ENCOUNTER — Other Ambulatory Visit: Payer: Medicare Other | Admitting: Lab

## 2014-07-14 ENCOUNTER — Ambulatory Visit: Payer: Medicare Other | Admitting: Hematology & Oncology

## 2014-07-15 ENCOUNTER — Telehealth: Payer: Self-pay | Admitting: Hematology & Oncology

## 2014-07-15 ENCOUNTER — Ambulatory Visit (HOSPITAL_BASED_OUTPATIENT_CLINIC_OR_DEPARTMENT_OTHER): Payer: Medicare Other | Admitting: Hematology & Oncology

## 2014-07-15 ENCOUNTER — Other Ambulatory Visit (HOSPITAL_BASED_OUTPATIENT_CLINIC_OR_DEPARTMENT_OTHER): Payer: Medicare Other | Admitting: Lab

## 2014-07-15 ENCOUNTER — Encounter: Payer: Self-pay | Admitting: Hematology & Oncology

## 2014-07-15 VITALS — BP 134/62 | HR 67 | Temp 97.4°F | Resp 18 | Ht 72.0 in | Wt 209.0 lb

## 2014-07-15 DIAGNOSIS — C9000 Multiple myeloma not having achieved remission: Secondary | ICD-10-CM

## 2014-07-15 DIAGNOSIS — C9001 Multiple myeloma in remission: Secondary | ICD-10-CM | POA: Diagnosis not present

## 2014-07-15 DIAGNOSIS — Z6828 Body mass index (BMI) 28.0-28.9, adult: Secondary | ICD-10-CM | POA: Diagnosis not present

## 2014-07-15 DIAGNOSIS — E109 Type 1 diabetes mellitus without complications: Secondary | ICD-10-CM | POA: Diagnosis not present

## 2014-07-15 DIAGNOSIS — I10 Essential (primary) hypertension: Secondary | ICD-10-CM | POA: Diagnosis not present

## 2014-07-15 LAB — CMP (CANCER CENTER ONLY)
ALT(SGPT): 23 U/L (ref 10–47)
AST: 23 U/L (ref 11–38)
Albumin: 3.7 g/dL (ref 3.3–5.5)
Alkaline Phosphatase: 49 U/L (ref 26–84)
BUN, Bld: 16 mg/dL (ref 7–22)
CO2: 30 meq/L (ref 18–33)
Calcium: 9.1 mg/dL (ref 8.0–10.3)
Chloride: 103 meq/L (ref 98–108)
Creat: 1 mg/dL (ref 0.6–1.2)
Glucose, Bld: 190 mg/dL — ABNORMAL HIGH (ref 73–118)
Potassium: 4.3 meq/L (ref 3.3–4.7)
Sodium: 141 meq/L (ref 128–145)
Total Bilirubin: 1.2 mg/dL (ref 0.20–1.60)
Total Protein: 6.9 g/dL (ref 6.4–8.1)

## 2014-07-15 LAB — CBC WITH DIFFERENTIAL (CANCER CENTER ONLY)
BASO#: 0 10*3/uL (ref 0.0–0.2)
BASO%: 0.7 % (ref 0.0–2.0)
EOS%: 2.5 % (ref 0.0–7.0)
Eosinophils Absolute: 0.1 10*3/uL (ref 0.0–0.5)
HCT: 47.5 % (ref 38.7–49.9)
HGB: 16.2 g/dL (ref 13.0–17.1)
LYMPH#: 1.4 10*3/uL (ref 0.9–3.3)
LYMPH%: 24.8 % (ref 14.0–48.0)
MCH: 33.7 pg — AB (ref 28.0–33.4)
MCHC: 34.1 g/dL (ref 32.0–35.9)
MCV: 99 fL — AB (ref 82–98)
MONO#: 0.6 10*3/uL (ref 0.1–0.9)
MONO%: 10.8 % (ref 0.0–13.0)
NEUT%: 61.2 % (ref 40.0–80.0)
NEUTROS ABS: 3.4 10*3/uL (ref 1.5–6.5)
Platelets: 176 10*3/uL (ref 145–400)
RBC: 4.81 10*6/uL (ref 4.20–5.70)
RDW: 13 % (ref 11.1–15.7)
WBC: 5.5 10*3/uL (ref 4.0–10.0)

## 2014-07-15 NOTE — Progress Notes (Signed)
Hematology and Oncology Follow Up Visit  Rickey [name] [id number] [birth year] 74 y.o. 07/15/2014   Principle Diagnosis:   Kappa light chain myeloma-clinical remission  Current Therapy:    Observation     Interim History:  [name] is back for followup. We have kept him busy since we last saw him. We found that he had an elevated Kappa light chain in his serum. Based on this, we did a restaging evaluation. A 24-hour urine was done. This only showed 140 mg of protein in his urine. The Kappa light chain was 7.23 mg/L.  We did a bone survey on him. The radiologist reported a "progression" of his myeloma. As such, we then did a PET scan on him. This just showed some myelomatous lesions involving the skull. No hypermetabolic lesions were noted.  We then did a bone marrow biopsy on him. This was done on December 8. The bone marrow report ([id number]) showed that he had a normocellular marrow. There is no increase in plasma cells. Everything really looked pretty mature.  We did do cytogenetics. He had normal cytogenetics. 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 It was felt that these were not clinically significant. 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 This, I think, might be some that were going to have to worry about.  Again he feels great. He and his wife just got back from [place]. They travel all the time. They're going down to their beach house this weekend. They will be down there for a while.  He has diabetes. He has an insulin pump. This is been working quite well.  He's had no bony pain. Has had no infections. He's had no nausea or vomiting. He's had no change in bowel or bladder habits. He's had no leg swelling. He's had no rashes. He's had no weight loss or weight gain.  Overall, his performance status is ECOG 0.          Medications:  Current outpatient prescriptions:  .  amLODipine (NORVASC) 2.5 MG tablet, Take 2.5 mg by mouth daily. , Disp: , Rfl:  .   aspirin 81 MG tablet, Take 81 mg by mouth daily.  , Disp: , Rfl:  .  atorvastatin (LIPITOR) 10 MG tablet, Take 10 mg by mouth daily.  , Disp: , Rfl:  .  calcium-vitamin D (OSCAL) 250-125 MG-UNIT per tablet, Take 1 tablet by mouth daily. , Disp: , Rfl:  .  Cholecalciferol (VITAMIN D) 2000 UNITS CAPS, Take by mouth every morning., Disp: , Rfl:  .  cycloSPORINE (RESTASIS) 0.05 % ophthalmic emulsion, 1 drop 2 (two) times daily.  , Disp: , Rfl:  .  fish oil-omega-3 fatty acids 1000 MG capsule, Take 2 g by mouth 2 (two) times daily. , Disp: , Rfl:  .  insulin lispro (HUMALOG) 100 UNIT/ML injection, Inject into the skin. Insulin pump, Disp: , Rfl:  .  montelukast (SINGULAIR) 10 MG tablet, Take 10 mg by mouth at bedtime.  , Disp: , Rfl:  .  telmisartan-hydrochlorothiazide (MICARDIS HCT) 80-12.5 MG per tablet, Take 1 tablet by mouth daily. , Disp: , Rfl:   Allergies: No Known Allergies  Past Medical History, Surgical history, Social history, and Family History were reviewed and updated.  Review of Systems: As above  Physical Exam:  height is 6' (1.829 m) and weight is 209 lb (94.802 kg). His oral temperature is 97.4 F (36.3 C). His blood pressure is 134/62 and his pulse is 67. His  respiration is 18.   Head and neck exam shows no ocular or oral lesions. He has no palpable cervical or supraclavicular lymph nodes. Thyroid is nonpalpable.Lungs are clear. Cardiac exam regular rate and rhythm with a normal S1 and S2. There are no murmurs, rubs or bruits. . Abdomen is soft. Has good bowel sounds. There is no palpable abdominal mass. There is no palpable liver or spleen. Extremities shows no clubbing cyanosis or edema. Skin exam no rashes ecchymosis or petechia.  Neurological exam is nonfocal.  Lab Results  Component Value Date   WBC 5.5 07/15/2014   HGB 16.2 07/15/2014   HCT 47.5 07/15/2014   MCV 99* 07/15/2014   PLT 176 07/15/2014     Chemistry      Component Value Date/Time   NA 141 07/15/2014  1122   NA 141 10/12/2013 1156   NA 138 04/16/2013 1133   K 4.3 07/15/2014 1122   K 4.8 10/12/2013 1156   K 4.2 04/16/2013 1133   CL 103 07/15/2014 1122   CL 103 04/16/2013 1133   CO2 30 07/15/2014 1122   CO2 25 10/12/2013 1156   CO2 27 04/16/2013 1133   BUN 16 07/15/2014 1122   BUN 19.9 10/12/2013 1156   BUN 18 04/16/2013 1133   CREATININE 1.0 07/15/2014 1122   CREATININE 1.1 10/12/2013 1156   CREATININE 1.12 04/16/2013 1133      Component Value Date/Time   CALCIUM 9.1 07/15/2014 1122   CALCIUM 9.5 10/12/2013 1156   CALCIUM 8.9 04/16/2013 1133   ALKPHOS 49 07/15/2014 1122   ALKPHOS 43 10/12/2013 1156   ALKPHOS 45 04/16/2013 1133   AST 23 07/15/2014 1122   AST 25 10/12/2013 1156   AST 22 04/16/2013 1133   ALT 23 07/15/2014 1122   ALT 26 10/12/2013 1156   ALT 26 04/16/2013 1133   BILITOT 1.20 07/15/2014 1122   BILITOT 1.30* 10/12/2013 1156   BILITOT 1.1 04/16/2013 1133         Impression and Plan: Rickey [name] is a 74 year old gentleman with kappa light chain myeloma. He got into a remission currently quickly. We then got him to transplant.   It has been 51/2 years since transplant.  We did a very thorough evaluation for the possibility of relapse. We had found that his Kappa chain had gone up and his serum. We then did a urinalysis. This was pretty much normal. We also did a bone marrow biopsy on him. This was normal without increased myeloma cells.  He is totally asymptomatic. It'll be interesting to see what his numbers look like today.  I still have to be a little bit suspicious about myeloma recurrence. I do worry about this abnormality on his FISH study.  I want to see him back in about 3 months.  I spent about 35 minutes with he and his wife. I went over all other labs and bone marrow results that we did on him. Cassell Smiles, MD 2/19/20162:13 PM

## 2014-07-15 NOTE — Telephone Encounter (Signed)
Pt moved 5-19 to 5-20

## 2014-07-19 LAB — PROTEIN ELECTROPHORESIS, SERUM, WITH REFLEX
Albumin ELP: 63.5 % (ref 55.8–66.1)
Alpha-1-Globulin: 4.4 % (ref 2.9–4.9)
Alpha-2-Globulin: 9.1 % (ref 7.1–11.8)
Beta 2: 4.9 % (ref 3.2–6.5)
Beta Globulin: 5.6 % (ref 4.7–7.2)
GAMMA GLOBULIN: 12.5 % (ref 11.1–18.8)
Total Protein, Serum Electrophoresis: 6.3 g/dL (ref 6.0–8.3)

## 2014-07-19 LAB — IGG, IGA, IGM
IGG (IMMUNOGLOBIN G), SERUM: 852 mg/dL (ref 650–1600)
IGM, SERUM: 32 mg/dL — AB (ref 41–251)
IgA: 161 mg/dL (ref 68–379)

## 2014-07-19 LAB — KAPPA/LAMBDA LIGHT CHAINS
KAPPA LAMBDA RATIO: 57.64 — AB (ref 0.26–1.65)
Kappa free light chain: 41.5 mg/dL — ABNORMAL HIGH (ref 0.33–1.94)
Lambda Free Lght Chn: 0.72 mg/dL (ref 0.57–2.63)

## 2014-07-19 LAB — LACTATE DEHYDROGENASE: LDH: 153 U/L (ref 94–250)

## 2014-07-19 LAB — BETA 2 MICROGLOBULIN, SERUM: Beta-2 Microglobulin: 1.9 mg/L (ref <=2.51)

## 2014-07-25 ENCOUNTER — Telehealth: Payer: Self-pay | Admitting: *Deleted

## 2014-07-25 NOTE — Telephone Encounter (Signed)
Patient calling for lab results. Spoke to Dr Marin Olp, and he will return call to patient.

## 2014-07-27 ENCOUNTER — Other Ambulatory Visit: Payer: Self-pay | Admitting: Hematology & Oncology

## 2014-07-27 DIAGNOSIS — C9000 Multiple myeloma not having achieved remission: Secondary | ICD-10-CM

## 2014-09-12 DIAGNOSIS — H6123 Impacted cerumen, bilateral: Secondary | ICD-10-CM | POA: Diagnosis not present

## 2014-09-13 DIAGNOSIS — Z961 Presence of intraocular lens: Secondary | ICD-10-CM | POA: Diagnosis not present

## 2014-09-13 DIAGNOSIS — H35412 Lattice degeneration of retina, left eye: Secondary | ICD-10-CM | POA: Diagnosis not present

## 2014-09-13 DIAGNOSIS — E11329 Type 2 diabetes mellitus with mild nonproliferative diabetic retinopathy without macular edema: Secondary | ICD-10-CM | POA: Diagnosis not present

## 2014-09-13 DIAGNOSIS — E108 Type 1 diabetes mellitus with unspecified complications: Secondary | ICD-10-CM | POA: Diagnosis not present

## 2014-10-13 ENCOUNTER — Ambulatory Visit: Payer: Medicare Other | Admitting: Hematology & Oncology

## 2014-10-13 ENCOUNTER — Other Ambulatory Visit: Payer: Medicare Other | Admitting: Lab

## 2014-10-14 ENCOUNTER — Encounter: Payer: Self-pay | Admitting: Hematology & Oncology

## 2014-10-14 ENCOUNTER — Ambulatory Visit (HOSPITAL_BASED_OUTPATIENT_CLINIC_OR_DEPARTMENT_OTHER): Payer: Medicare Other | Admitting: Hematology & Oncology

## 2014-10-14 ENCOUNTER — Other Ambulatory Visit (HOSPITAL_BASED_OUTPATIENT_CLINIC_OR_DEPARTMENT_OTHER): Payer: Medicare Other

## 2014-10-14 VITALS — BP 125/55 | HR 70 | Temp 97.5°F | Resp 20 | Wt 209.0 lb

## 2014-10-14 DIAGNOSIS — N400 Enlarged prostate without lower urinary tract symptoms: Secondary | ICD-10-CM | POA: Diagnosis not present

## 2014-10-14 DIAGNOSIS — Z6828 Body mass index (BMI) 28.0-28.9, adult: Secondary | ICD-10-CM | POA: Diagnosis not present

## 2014-10-14 DIAGNOSIS — C9000 Multiple myeloma not having achieved remission: Secondary | ICD-10-CM

## 2014-10-14 DIAGNOSIS — C9001 Multiple myeloma in remission: Secondary | ICD-10-CM

## 2014-10-14 DIAGNOSIS — E109 Type 1 diabetes mellitus without complications: Secondary | ICD-10-CM | POA: Diagnosis not present

## 2014-10-14 DIAGNOSIS — E103219 Type 1 diabetes mellitus with mild nonproliferative diabetic retinopathy with macular edema, unspecified eye: Secondary | ICD-10-CM

## 2014-10-14 DIAGNOSIS — I10 Essential (primary) hypertension: Secondary | ICD-10-CM | POA: Diagnosis not present

## 2014-10-14 DIAGNOSIS — Z1389 Encounter for screening for other disorder: Secondary | ICD-10-CM | POA: Diagnosis not present

## 2014-10-14 LAB — CBC WITH DIFFERENTIAL (CANCER CENTER ONLY)
BASO#: 0.1 10*3/uL (ref 0.0–0.2)
BASO%: 0.8 % (ref 0.0–2.0)
EOS ABS: 0.1 10*3/uL (ref 0.0–0.5)
EOS%: 1.5 % (ref 0.0–7.0)
HEMATOCRIT: 47.6 % (ref 38.7–49.9)
HGB: 16.6 g/dL (ref 13.0–17.1)
LYMPH#: 1.5 10*3/uL (ref 0.9–3.3)
LYMPH%: 24.4 % (ref 14.0–48.0)
MCH: 34.4 pg — AB (ref 28.0–33.4)
MCHC: 34.9 g/dL (ref 32.0–35.9)
MCV: 99 fL — ABNORMAL HIGH (ref 82–98)
MONO#: 0.6 10*3/uL (ref 0.1–0.9)
MONO%: 10.5 % (ref 0.0–13.0)
NEUT#: 3.8 10*3/uL (ref 1.5–6.5)
NEUT%: 62.8 % (ref 40.0–80.0)
PLATELETS: 159 10*3/uL (ref 145–400)
RBC: 4.83 10*6/uL (ref 4.20–5.70)
RDW: 12.9 % (ref 11.1–15.7)
WBC: 6.1 10*3/uL (ref 4.0–10.0)

## 2014-10-14 LAB — CMP (CANCER CENTER ONLY)
ALBUMIN: 3.7 g/dL (ref 3.3–5.5)
ALT(SGPT): 22 U/L (ref 10–47)
AST: 24 U/L (ref 11–38)
Alkaline Phosphatase: 51 U/L (ref 26–84)
BILIRUBIN TOTAL: 1.5 mg/dL (ref 0.20–1.60)
BUN, Bld: 13 mg/dL (ref 7–22)
CO2: 31 meq/L (ref 18–33)
Calcium: 8.8 mg/dL (ref 8.0–10.3)
Chloride: 104 mEq/L (ref 98–108)
Creat: 1.2 mg/dl (ref 0.6–1.2)
GLUCOSE: 209 mg/dL — AB (ref 73–118)
Potassium: 4.3 mEq/L (ref 3.3–4.7)
Sodium: 141 mEq/L (ref 128–145)
Total Protein: 6.5 g/dL (ref 6.4–8.1)

## 2014-10-14 NOTE — Progress Notes (Signed)
Hematology and Oncology Follow Up Visit  Rickey [name] [id number] [birth year] 74 y.o. 10/14/2014   Principle Diagnosis:   Kappa light chain myeloma-clinical remission  Current Therapy:    Observation     Interim History:  Rickey [name] is back for followup. He is doing okay. He has had no problems since we last saw him. [place]  We last saw him, his Kappa light chain was 41.5 mg/dL. It will be interesting to see what it is now.  He did bring in a 24 hour urine.  He's had no problems with bony pain. He's had no leg swelling. He's had no problems with his blood sugars. He is on insulin pump.  He's had no change in bowel or bladder habits.  He's had no fever. He's had no cough or shortness of breath. He's had no issues with infections. habits. He's had no leg swelling. He's had no rashes. He's had no weight loss or weight gain.  Overall, his performance status is ECOG 0.   Medications:  Current outpatient prescriptions:  .  amLODipine (NORVASC) 2.5 MG tablet, Take 2.5 mg by mouth daily. , Disp: , Rfl:  .  aspirin 81 MG tablet, Take 81 mg by mouth daily.  , Disp: , Rfl:  .  atorvastatin (LIPITOR) 10 MG tablet, Take 10 mg by mouth daily.  , Disp: , Rfl:  .  calcium-vitamin D (OSCAL) 250-125 MG-UNIT per tablet, Take 1 tablet by mouth daily. , Disp: , Rfl:  .  Cholecalciferol (VITAMIN D) 2000 UNITS CAPS, Take by mouth every morning., Disp: , Rfl:  .  cycloSPORINE (RESTASIS) 0.05 % ophthalmic emulsion, 1 drop 2 (two) times daily.  , Disp: , Rfl:  .  fish oil-omega-3 fatty acids 1000 MG capsule, Take 2 g by mouth 2 (two) times daily. , Disp: , Rfl:  .  insulin lispro (HUMALOG) 100 UNIT/ML injection, Inject into the skin. Insulin pump, Disp: , Rfl:  .  montelukast (SINGULAIR) 10 MG tablet, Take 10 mg by mouth at bedtime.  , Disp: , Rfl:  .  telmisartan-hydrochlorothiazide (MICARDIS HCT) 80-12.5 MG per tablet, Take 1 tablet by mouth daily. , Disp: ,  Rfl:   Allergies: No Known Allergies  Past Medical History, Surgical history, Social history, and Family History were reviewed and updated.  Review of Systems: As above  Physical Exam:  weight is 209 lb (94.802 kg). His oral temperature is 97.5 F (36.4 C). His blood pressure is 125/55 and his pulse is 70. His respiration is 20.   Head and neck exam shows no ocular or oral lesions. He has no palpable cervical or supraclavicular lymph nodes. Thyroid is nonpalpable.Lungs are clear. Cardiac exam regular rate and rhythm with a normal S1 and S2. There are no murmurs, rubs or bruits. . Abdomen is soft. Has good bowel sounds. There is no palpable abdominal mass. There is no palpable liver or spleen. Extremities shows no clubbing cyanosis or edema. Skin exam no rashes ecchymosis or petechia.  Neurological exam is nonfocal.  Lab Results  Component Value Date   WBC 6.1 10/14/2014   HGB 16.6 10/14/2014   HCT 47.6 10/14/2014   MCV 99* 10/14/2014   PLT 159 10/14/2014     Chemistry      Component Value Date/Time   NA 141 10/14/2014 1323   NA 141 10/12/2013 1156   NA 138 04/16/2013 1133   K 4.3 10/14/2014 1323   K 4.8  10/12/2013 1156   K 4.2 04/16/2013 1133   CL 104 10/14/2014 1323   CL 103 04/16/2013 1133   CO2 31 10/14/2014 1323   CO2 25 10/12/2013 1156   CO2 27 04/16/2013 1133   BUN 13 10/14/2014 1323   BUN 19.9 10/12/2013 1156   BUN 18 04/16/2013 1133   CREATININE 1.2 10/14/2014 1323   CREATININE 1.1 10/12/2013 1156   CREATININE 1.12 04/16/2013 1133      Component Value Date/Time   CALCIUM 8.8 10/14/2014 1323   CALCIUM 9.5 10/12/2013 1156   CALCIUM 8.9 04/16/2013 1133   ALKPHOS 51 10/14/2014 1323   ALKPHOS 43 10/12/2013 1156   ALKPHOS 45 04/16/2013 1133   AST 24 10/14/2014 1323   AST 25 10/12/2013 1156   AST 22 04/16/2013 1133   ALT 22 10/14/2014 1323   ALT 26 10/12/2013 1156   ALT 26 04/16/2013 1133   BILITOT 1.50 10/14/2014 1323   BILITOT 1.30* 10/12/2013 1156    BILITOT 1.1 04/16/2013 1133         Impression and Plan: Rickey [name] is a 74 year old gentleman with kappa light chain myeloma. He got into a remission currently quickly. We then got him to transplant.   It has been about 6 years since transplant.  We did a very thorough evaluation for the possibility of relapse. We had found that his Kappa chain had gone up in his serum. We then did a urinalysis. This was pretty much normal. We also did a bone marrow biopsy on him. This was normal without increased myeloma cells.  He i continues to be asymptomatic. He looks great. His labs look good.  However, I think that if his kappa light chain continues to go up, then I think this definitely is a sign of recurrent disease. I would definitely consider instituting therapy for him. We probably continues an all oral therapy with Ninlaro and Pomalist. I think this would be very reasonable given that that he is wife travel a lot.  I want to make an appointment to see him back in another 3-4 months. We will get him back sooner. Per review brought in a 24-hour urine that we will check.   I [name], MD 5/20/20162:17 PM

## 2014-10-19 LAB — KAPPA/LAMBDA LIGHT CHAINS
KAPPA LAMBDA RATIO: 63.91 — AB (ref 0.26–1.65)
Kappa free light chain: 58.8 mg/dL — ABNORMAL HIGH (ref 0.33–1.94)
Lambda Free Lght Chn: 0.92 mg/dL (ref 0.57–2.63)

## 2014-10-19 LAB — PROTEIN ELECTROPHORESIS, SERUM, WITH REFLEX
ALPHA-2-GLOBULIN: 0.6 g/dL (ref 0.5–0.9)
Albumin ELP: 3.9 g/dL (ref 3.8–4.8)
Alpha-1-Globulin: 0.3 g/dL (ref 0.2–0.3)
BETA 2: 0.3 g/dL (ref 0.2–0.5)
BETA GLOBULIN: 0.4 g/dL (ref 0.4–0.6)
Gamma Globulin: 0.8 g/dL (ref 0.8–1.7)
Total Protein, Serum Electrophoresis: 6.2 g/dL (ref 6.1–8.1)

## 2014-10-19 LAB — IGG, IGA, IGM
IGA: 130 mg/dL (ref 68–379)
IgG (Immunoglobin G), Serum: 802 mg/dL (ref 650–1600)
IgM, Serum: 28 mg/dL — ABNORMAL LOW (ref 41–251)

## 2014-10-19 LAB — BETA 2 MICROGLOBULIN, SERUM: Beta-2 Microglobulin: 1.85 mg/L (ref <=2.51)

## 2014-10-21 LAB — 24 HR URINE,KAPPA/LAMBDA LIGHT CHAINS
24H Urine Volume: 2800 mL/24 h
Measured Kappa Chain: 0.64 mg/dL (ref <2.00)
Measured Lambda Chain: <0.4 mg/dL (ref <2.00)
TOTAL KAPPA CHAIN: 17.92 mg/(24.h)

## 2014-10-21 LAB — UIFE/LIGHT CHAINS/TP QN, 24-HR UR
ALPHA 1 UR: DETECTED — AB
Albumin, U: DETECTED
Alpha 2, Urine: DETECTED — AB
BETA UR: DETECTED — AB
Gamma Globulin, Urine: DETECTED — AB
TIME-UPE24: 24 h
TOTAL PROTEIN, URINE-UPE24: 7 mg/dL (ref 5–25)
Total Protein, Urine-Ur/day: 196 mg/d — ABNORMAL HIGH (ref <150)
Volume, Urine: 2800 mL

## 2014-10-27 ENCOUNTER — Other Ambulatory Visit: Payer: Self-pay | Admitting: Hematology & Oncology

## 2014-10-27 DIAGNOSIS — C9000 Multiple myeloma not having achieved remission: Secondary | ICD-10-CM

## 2014-10-31 ENCOUNTER — Encounter: Payer: Self-pay | Admitting: Gastroenterology

## 2014-11-01 ENCOUNTER — Telehealth: Payer: Self-pay | Admitting: Hematology & Oncology

## 2014-11-01 NOTE — Telephone Encounter (Signed)
Spoke with patient and patient's wife, they cx 12/01/14 apt and resch for 11/24/14.  They are aware of appt and calendar was mailed to their home

## 2014-11-24 ENCOUNTER — Encounter: Payer: Self-pay | Admitting: Hematology & Oncology

## 2014-11-24 ENCOUNTER — Other Ambulatory Visit (HOSPITAL_BASED_OUTPATIENT_CLINIC_OR_DEPARTMENT_OTHER): Payer: Medicare Other

## 2014-11-24 ENCOUNTER — Ambulatory Visit (HOSPITAL_BASED_OUTPATIENT_CLINIC_OR_DEPARTMENT_OTHER): Payer: Medicare Other | Admitting: Hematology & Oncology

## 2014-11-24 VITALS — BP 123/53 | HR 68 | Temp 98.0°F | Resp 18 | Ht 71.0 in | Wt 207.0 lb

## 2014-11-24 DIAGNOSIS — C9000 Multiple myeloma not having achieved remission: Secondary | ICD-10-CM

## 2014-11-24 DIAGNOSIS — Z23 Encounter for immunization: Secondary | ICD-10-CM | POA: Diagnosis not present

## 2014-11-24 DIAGNOSIS — S91332A Puncture wound without foreign body, left foot, initial encounter: Secondary | ICD-10-CM

## 2014-11-24 LAB — COMPREHENSIVE METABOLIC PANEL (CC13)
ALT: 17 U/L (ref 0–55)
ANION GAP: 7 meq/L (ref 3–11)
AST: 19 U/L (ref 5–34)
Albumin: 3.5 g/dL (ref 3.5–5.0)
Alkaline Phosphatase: 45 U/L (ref 40–150)
BUN: 15 mg/dL (ref 7.0–26.0)
CO2: 26 mEq/L (ref 22–29)
CREATININE: 1.1 mg/dL (ref 0.7–1.3)
Calcium: 9.2 mg/dL (ref 8.4–10.4)
Chloride: 106 mEq/L (ref 98–109)
EGFR: 68 mL/min/{1.73_m2} — ABNORMAL LOW (ref >90)
GLUCOSE: 140 mg/dL (ref 70–140)
POTASSIUM: 4.1 meq/L (ref 3.5–5.1)
Sodium: 139 mEq/L (ref 136–145)
Total Bilirubin: 1.32 mg/dL — ABNORMAL HIGH (ref 0.20–1.20)
Total Protein: 5.9 g/dL — ABNORMAL LOW (ref 6.4–8.3)

## 2014-11-24 LAB — CBC WITH DIFFERENTIAL (CANCER CENTER ONLY)
BASO#: 0.1 10*3/uL (ref 0.0–0.2)
BASO%: 1.1 % (ref 0.0–2.0)
EOS ABS: 0.1 10*3/uL (ref 0.0–0.5)
EOS%: 2 % (ref 0.0–7.0)
HCT: 46.1 % (ref 38.7–49.9)
HGB: 16.4 g/dL (ref 13.0–17.1)
LYMPH#: 1.4 10*3/uL (ref 0.9–3.3)
LYMPH%: 24.8 % (ref 14.0–48.0)
MCH: 34.7 pg — ABNORMAL HIGH (ref 28.0–33.4)
MCHC: 35.6 g/dL (ref 32.0–35.9)
MCV: 98 fL (ref 82–98)
MONO#: 0.7 10*3/uL (ref 0.1–0.9)
MONO%: 13.5 % — ABNORMAL HIGH (ref 0.0–13.0)
NEUT%: 58.6 % (ref 40.0–80.0)
NEUTROS ABS: 3.2 10*3/uL (ref 1.5–6.5)
Platelets: 166 10*3/uL (ref 145–400)
RBC: 4.73 10*6/uL (ref 4.20–5.70)
RDW: 12.8 % (ref 11.1–15.7)
WBC: 5.5 10*3/uL (ref 4.0–10.0)

## 2014-11-24 LAB — LACTATE DEHYDROGENASE: LDH: 137 U/L (ref 94–250)

## 2014-11-24 MED ORDER — POMALIDOMIDE 4 MG PO CAPS: 4.0000 mg | ORAL_CAPSULE | Freq: Every day | ORAL | Status: DC

## 2014-11-24 MED ORDER — IXAZOMIB CITRATE 4 MG PO CAPS: ORAL_CAPSULE | ORAL | Status: DC

## 2014-11-24 MED ORDER — PROCHLORPERAZINE MALEATE 10 MG PO TABS: 10.0000 mg | ORAL_TABLET | Freq: Four times a day (QID) | ORAL | Status: DC | PRN

## 2014-11-24 MED ORDER — MUPIROCIN 2 % EX OINT: TOPICAL_OINTMENT | CUTANEOUS | Status: DC

## 2014-11-25 ENCOUNTER — Telehealth: Payer: Self-pay | Admitting: Hematology & Oncology

## 2014-11-25 ENCOUNTER — Other Ambulatory Visit: Payer: Self-pay | Admitting: *Deleted

## 2014-11-25 NOTE — Telephone Encounter (Signed)
Returned pt call and resched appt in Aug to 10th

## 2014-11-25 NOTE — Progress Notes (Signed)
Hematology and Oncology Follow Up Visit  NICHALOS BRENTON [id number] 05/18/1941 74 y.o. 11/25/2014   Principle Diagnosis:   Kappa light chain myeloma-clinical relapse  Current Therapy:    Patient to start cycle #1 Ninlaro/Pomalyst     Interim History:  Mr.  Renwick is back for followup. Unfortunately, I really believe that he has relapsed disease. We've been watching his light chain levels for several months. It is clear that his light chains have been going up consistently.we last saw him in May, his Kappa LightI chain was up to 59 mg/dL.  6 months ago it was 22 mg/dL.we did do a bone marrow biopsy on him back in December. This really was non-diagnostic. 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 We did a PET scan on him. Again this did not show any obvious hypermetabolism.  Again, I think his blood studies are what is important right now.   We did do a 24-hour urine on him.. His protein level was obtained on 196 mg per day. Previously, it was under 40 mg per day.   When he first presented he had over 10,000 mg of protein in his urine.  He responded incredibly well with first treated him. I think he will respond well again.  His diabetes is doing pretty well. He has this under good control.  He did slip and fall yesterday. He has some abrasions on his hand..  His appetite is good. He's had no nausea or vomiting. He's had no issues with infections. He's had no bleeding. He's had no change in bowel or bladder habits.  Medications:  Current outpatient prescriptions:  .  amLODipine (NORVASC) 2.5 MG tablet, Take 2.5 mg by mouth daily. , Disp: , Rfl:  .  aspirin 81 MG tablet, Take 81 mg by mouth daily.  , Disp: , Rfl:  .  atorvastatin (LIPITOR) 10 MG tablet, Take 10 mg by mouth daily.  , Disp: , Rfl:  .  calcium-vitamin D (OSCAL) 250-125 MG-UNIT per tablet, Take 1 tablet by mouth daily. , Disp: , Rfl:  .  Cholecalciferol (VITAMIN D) 2000 UNITS CAPS, Take by mouth every  morning., Disp: , Rfl:  .  cycloSPORINE (RESTASIS) 0.05 % ophthalmic emulsion, 1 drop 2 (two) times daily.  , Disp: , Rfl:  .  fish oil-omega-3 fatty acids 1000 MG capsule, Take 2 g by mouth 2 (two) times daily. , Disp: , Rfl:  .  insulin lispro (HUMALOG) 100 UNIT/ML injection, Inject into the skin. Insulin pump, Disp: , Rfl:  .  montelukast (SINGULAIR) 10 MG tablet, Take 10 mg by mouth at bedtime.  , Disp: , Rfl:  .  MYRBETRIQ 50 MG TB24 tablet, TK 1 T PO QD, Disp: , Rfl: 4 .  tamsulosin (FLOMAX) 0.4 MG CAPS capsule, TK 1 C PO QD HS, Disp: , Rfl: 4 .  telmisartan-hydrochlorothiazide (MICARDIS HCT) 80-12.5 MG per tablet, Take 1 tablet by mouth daily. , Disp: , Rfl:  .  ixazomib citrate (NINLARO) 4 MG capsule, Take 1 capsule a week for 3 weeks on empty stomach then off for 1 week, Disp: 3 capsule, Rfl: 6 .  mupirocin ointment (BACTROBAN) 2 %, Apply 3 times a day to wound on first toe of the left foot., Disp: 22 g, Rfl: 0 .  pomalidomide (POMALYST) 4 MG capsule, Take 1 capsule (4 mg total) by mouth daily. Take with water on days 1-21. Repeat every 28 days., Disp: 21 capsule, Rfl: 6 .  prochlorperazine (COMPAZINE) 10 MG tablet,  Take 1 tablet (10 mg total) by mouth every 6 (six) hours as needed for nausea or vomiting., Disp: 60 tablet, Rfl: 0  Allergies: No Known Allergies  Past Medical History, Surgical history, Social history, and Family History were reviewed and updated.  Review of Systems: As above  Physical Exam:  height is $RemoveB'5\' 11"'DtALZHgy$  (1.803 m) and weight is 207 lb (93.895 kg). His oral temperature is 98 F (36.7 C). His blood pressure is 123/53 and his pulse is 68. His respiration is 18.   Head and neck exam shows no ocular or oral lesions. He has no palpable cervical or supraclavicular lymph nodes. Thyroid is nonpalpable.Lungs are clear. Cardiac exam regular rate and rhythm with a normal S1 and S2. There are no murmurs, rubs or bruits. . Abdomen is soft. Has good bowel sounds. There is no  palpable abdominal mass. There is no palpable liver or spleen. Extremities shows no clubbing cyanosis or edema. Skin exam no rashes ecchymosis or petechia.  Neurological exam is nonfocal.  Lab Results  Component Value Date   WBC 5.5 11/24/2014   HGB 16.4 11/24/2014   HCT 46.1 11/24/2014   MCV 98 11/24/2014   PLT 166 11/24/2014     Chemistry      Component Value Date/Time   NA 139 11/24/2014 0835   NA 141 10/14/2014 1323   NA 138 04/16/2013 1133   K 4.1 11/24/2014 0835   K 4.3 10/14/2014 1323   K 4.2 04/16/2013 1133   CL 104 10/14/2014 1323   CL 103 04/16/2013 1133   CO2 26 11/24/2014 0835   CO2 31 10/14/2014 1323   CO2 27 04/16/2013 1133   BUN 15.0 11/24/2014 0835   BUN 13 10/14/2014 1323   BUN 18 04/16/2013 1133   CREATININE 1.1 11/24/2014 0835   CREATININE 1.2 10/14/2014 1323   CREATININE 1.12 04/16/2013 1133      Component Value Date/Time   CALCIUM 9.2 11/24/2014 0835   CALCIUM 8.8 10/14/2014 1323   CALCIUM 8.9 04/16/2013 1133   ALKPHOS 45 11/24/2014 0835   ALKPHOS 51 10/14/2014 1323   ALKPHOS 45 04/16/2013 1133   AST 19 11/24/2014 0835   AST 24 10/14/2014 1323   AST 22 04/16/2013 1133   ALT 17 11/24/2014 0835   ALT 22 10/14/2014 1323   ALT 26 04/16/2013 1133   BILITOT 1.32* 11/24/2014 0835   BILITOT 1.50 10/14/2014 1323   BILITOT 1.1 04/16/2013 1133         Impression and Plan: Mr. Pickelsimer is a 74 year old gentleman with kappa light chain myeloma. He got into a remission currently quickly. We then got him to transplant.   It has been about 6 years since transplant.  Again, I think we have to get him started on therapy. I think the really sure as whether or not he will need any type of transplant. I am sure that there are stem cells that are saved.  I will have to speak with his transplant doctor at [facility].  Because of he and his wife's travel and social schedule, I think an oral regimen would be very appropriate. As such, I think that Pomalidomide with  Ninlaro would be appropriate.I will hold Decadron because of his diabetes.  I went over the treatment side effects. I really don't think that he'll have a lot of issues with side effects. He has a had very few side effects we treated him 6 years ago with Velcade/Revlimid.  We easily follow his blood work and  24-hour urine to see how he is responding.  I spent about 45 minutes with he and his wife. I answered all their questions.  I want to see him back in one month. I probably would not repeat his lab work for 2 months.     [name], MD 7/1/20167:07 AM

## 2014-11-29 ENCOUNTER — Telehealth: Payer: Self-pay | Admitting: *Deleted

## 2014-11-29 LAB — KAPPA/LAMBDA LIGHT CHAINS
Kappa free light chain: 68.3 mg/dL — ABNORMAL HIGH (ref 0.33–1.94)
Kappa:Lambda Ratio: 73.44 — ABNORMAL HIGH (ref 0.26–1.65)
LAMBDA FREE LGHT CHN: 0.93 mg/dL (ref 0.57–2.63)

## 2014-11-29 LAB — SPEP & IFE WITH QIG
ALPHA-1-GLOBULIN: 0.3 g/dL (ref 0.2–0.3)
Albumin ELP: 3.8 g/dL (ref 3.8–4.8)
Alpha-2-Globulin: 0.6 g/dL (ref 0.5–0.9)
BETA GLOBULIN: 0.3 g/dL — AB (ref 0.4–0.6)
Beta 2: 0.3 g/dL (ref 0.2–0.5)
Gamma Globulin: 0.7 g/dL — ABNORMAL LOW (ref 0.8–1.7)
IgA: 193 mg/dL (ref 68–379)
IgG (Immunoglobin G), Serum: 1330 mg/dL (ref 650–1600)
IgM, Serum: 95 mg/dL (ref 41–251)
TOTAL PROTEIN, SERUM ELECTROPHOR: 6 g/dL — AB (ref 6.1–8.1)

## 2014-11-29 LAB — BETA 2 MICROGLOBULIN, SERUM: BETA 2 MICROGLOBULIN: 1.9 mg/L (ref <=2.51)

## 2014-11-29 NOTE — Telephone Encounter (Signed)
Received call from [facility] that [place] has been approved for one year effective 11/29/2014

## 2014-12-01 ENCOUNTER — Ambulatory Visit: Payer: BLUE CROSS/BLUE SHIELD | Admitting: Hematology & Oncology

## 2014-12-01 ENCOUNTER — Other Ambulatory Visit: Payer: BLUE CROSS/BLUE SHIELD

## 2014-12-12 ENCOUNTER — Telehealth: Payer: Self-pay

## 2014-12-12 NOTE — Telephone Encounter (Signed)
Received call from pt reporting he has received Pomalyst & questioning if he should begin taking it or wait to see if [name] is approved.  Per Dr Marin Olp, pt to begin [facility] tomorrow am. Pt verbalizes understanding. dph

## 2014-12-22 ENCOUNTER — Other Ambulatory Visit: Payer: Self-pay | Admitting: *Deleted

## 2014-12-22 DIAGNOSIS — C9000 Multiple myeloma not having achieved remission: Secondary | ICD-10-CM

## 2014-12-22 DIAGNOSIS — S91332A Puncture wound without foreign body, left foot, initial encounter: Secondary | ICD-10-CM

## 2014-12-22 MED ORDER — POMALIDOMIDE 4 MG PO CAPS: 4.0000 mg | ORAL_CAPSULE | Freq: Every day | ORAL | Status: DC

## 2015-01-02 ENCOUNTER — Ambulatory Visit: Payer: Medicare Other | Admitting: Hematology & Oncology

## 2015-01-02 ENCOUNTER — Other Ambulatory Visit: Payer: Medicare Other

## 2015-01-04 ENCOUNTER — Other Ambulatory Visit (HOSPITAL_BASED_OUTPATIENT_CLINIC_OR_DEPARTMENT_OTHER): Payer: Medicare Other

## 2015-01-04 ENCOUNTER — Telehealth: Payer: Self-pay | Admitting: Hematology & Oncology

## 2015-01-04 ENCOUNTER — Ambulatory Visit (HOSPITAL_BASED_OUTPATIENT_CLINIC_OR_DEPARTMENT_OTHER): Payer: Medicare Other | Admitting: Hematology & Oncology

## 2015-01-04 ENCOUNTER — Encounter: Payer: Self-pay | Admitting: Hematology & Oncology

## 2015-01-04 VITALS — BP 127/65 | HR 79 | Temp 97.8°F | Resp 16 | Ht 71.0 in | Wt 203.0 lb

## 2015-01-04 DIAGNOSIS — C9002 Multiple myeloma in relapse: Secondary | ICD-10-CM | POA: Diagnosis not present

## 2015-01-04 DIAGNOSIS — C9000 Multiple myeloma not having achieved remission: Secondary | ICD-10-CM

## 2015-01-04 DIAGNOSIS — E119 Type 2 diabetes mellitus without complications: Secondary | ICD-10-CM

## 2015-01-04 DIAGNOSIS — E103219 Type 1 diabetes mellitus with mild nonproliferative diabetic retinopathy with macular edema, unspecified eye: Secondary | ICD-10-CM

## 2015-01-04 DIAGNOSIS — C9001 Multiple myeloma in remission: Secondary | ICD-10-CM

## 2015-01-04 DIAGNOSIS — S91332A Puncture wound without foreign body, left foot, initial encounter: Secondary | ICD-10-CM

## 2015-01-04 DIAGNOSIS — E10321 Type 1 diabetes mellitus with mild nonproliferative diabetic retinopathy with macular edema: Secondary | ICD-10-CM | POA: Diagnosis not present

## 2015-01-04 LAB — COMPREHENSIVE METABOLIC PANEL (CC13)
ALK PHOS: 58 U/L (ref 40–150)
ALT: 24 U/L (ref 0–55)
AST: 18 U/L (ref 5–34)
Albumin: 3.5 g/dL (ref 3.5–5.0)
Anion Gap: 7 mEq/L (ref 3–11)
BILIRUBIN TOTAL: 1.6 mg/dL — AB (ref 0.20–1.20)
BUN: 19.8 mg/dL (ref 7.0–26.0)
CO2: 26 mEq/L (ref 22–29)
Calcium: 9 mg/dL (ref 8.4–10.4)
Chloride: 107 mEq/L (ref 98–109)
Creatinine: 1 mg/dL (ref 0.7–1.3)
EGFR: 72 mL/min/{1.73_m2} — AB (ref >90)
GLUCOSE: 192 mg/dL — AB (ref 70–140)
Potassium: 5 mEq/L (ref 3.5–5.1)
SODIUM: 140 meq/L (ref 136–145)
TOTAL PROTEIN: 6.4 g/dL (ref 6.4–8.3)

## 2015-01-04 LAB — CBC WITH DIFFERENTIAL (CANCER CENTER ONLY)
BASO#: 0.1 10*3/uL (ref 0.0–0.2)
BASO%: 1.4 % (ref 0.0–2.0)
EOS%: 3.3 % (ref 0.0–7.0)
Eosinophils Absolute: 0.2 10*3/uL (ref 0.0–0.5)
HCT: 48.3 % (ref 38.7–49.9)
HEMOGLOBIN: 16.8 g/dL (ref 13.0–17.1)
LYMPH#: 1.4 10*3/uL (ref 0.9–3.3)
LYMPH%: 27.6 % (ref 14.0–48.0)
MCH: 34 pg — AB (ref 28.0–33.4)
MCHC: 34.8 g/dL (ref 32.0–35.9)
MCV: 98 fL (ref 82–98)
MONO#: 1.1 10*3/uL — ABNORMAL HIGH (ref 0.1–0.9)
MONO%: 21.9 % — ABNORMAL HIGH (ref 0.0–13.0)
NEUT#: 2.3 10*3/uL (ref 1.5–6.5)
NEUT%: 45.8 % (ref 40.0–80.0)
Platelets: 126 10*3/uL — ABNORMAL LOW (ref 145–400)
RBC: 4.94 10*6/uL (ref 4.20–5.70)
RDW: 12.5 % (ref 11.1–15.7)
WBC: 5.1 10*3/uL (ref 4.0–10.0)

## 2015-01-04 MED ORDER — FAMCICLOVIR 250 MG PO TABS: 250.0000 mg | ORAL_TABLET | Freq: Two times a day (BID) | ORAL | Status: DC

## 2015-01-04 NOTE — Telephone Encounter (Signed)
Lt mess regarding change appt 8/26 to 9/14.

## 2015-01-04 NOTE — Progress Notes (Signed)
Hematology and Oncology Follow Up Visit  Rickey [name] [id number] Dec 05, 1940 74 y.o. 01/04/2015   Principle Diagnosis:   Kappa light chain myeloma-clinical relapse  Current Therapy:   Patient is s/p cycle #1 Ninlaro/Pomalyst (Ninlaro not yet approved by insurance company!!)      Interim History:  Rickey [name] is back for followup. Unfortunately, the [name] has not yet been given to him. For some reason, his insurance company is not allowing [place] to use it with Pomalidomide. It is clearly approved for use with Pomalidomide. I actually talked with the [facility] representatives who said that is being used with Pomalidomide by several physicians.  We will continue to insist to his insurance company that [place] be given to him.  He is only on Pomalidomide and so far, he's done well with this. He's had a little bit of a rash on the upper chest. This is responding to Benadryl.  He's had no change in bowel or bladder habits. He's had no diarrhea. He's had no nausea or vomiting.  He is diabetic. He has an insulin pump that seems to be working pretty well.  He's had no leg swelling. He's had no weakness.  [place] This is why we want him on Ninlaro because of the three-hour drive that it takes to get to our office. Ninlaro would clearly make his life better so that he could take the medicine at home.   Overall, his performance status is ECOG 1.    Medications:  Current outpatient prescriptions:  .  amLODipine (NORVASC) 2.5 MG tablet, Take 2.5 mg by mouth daily. , Disp: , Rfl:  .  aspirin 81 MG tablet, Take 81 mg by mouth daily.  , Disp: , Rfl:  .  atorvastatin (LIPITOR) 10 MG tablet, Take 10 mg by mouth daily.  , Disp: , Rfl:  .  calcium-vitamin D (OSCAL) 250-125 MG-UNIT per tablet, Take 1 tablet by mouth daily. , Disp: , Rfl:  .  Cholecalciferol (VITAMIN D) 2000 UNITS CAPS, Take by mouth every morning., Disp: , Rfl:  .  cycloSPORINE (RESTASIS)  0.05 % ophthalmic emulsion, 1 drop 2 (two) times daily.  , Disp: , Rfl:  .  famciclovir (FAMVIR) 250 MG tablet, Take 1 tablet (250 mg total) by mouth 2 (two) times daily., Disp: 30 tablet, Rfl: 12 .  fish oil-omega-3 fatty acids 1000 MG capsule, Take 2 g by mouth 2 (two) times daily. , Disp: , Rfl:  .  insulin lispro (HUMALOG) 100 UNIT/ML injection, Inject into the skin. Insulin pump, Disp: , Rfl:  .  ixazomib citrate (NINLARO) 4 MG capsule, Take 1 capsule a week for 3 weeks on empty stomach then off for 1 week, Disp: 3 capsule, Rfl: 6 .  montelukast (SINGULAIR) 10 MG tablet, Take 10 mg by mouth at bedtime.  , Disp: , Rfl:  .  MYRBETRIQ 50 MG TB24 tablet, TK 1 T PO QD, Disp: , Rfl: 4 .  pomalidomide (POMALYST) 4 MG capsule, Take 1 capsule (4 mg total) by mouth daily. Take with water on days 1-21. Repeat every 28 days. [id number], Disp: 21 capsule, Rfl: 6 .  prochlorperazine (COMPAZINE) 10 MG tablet, Take 1 tablet (10 mg total) by mouth every 6 (six) hours as needed for nausea or vomiting., Disp: 60 tablet, Rfl: 0 .  tamsulosin (FLOMAX) 0.4 MG CAPS capsule, TK 1 C PO QD HS, Disp: , Rfl: 4 .  telmisartan-hydrochlorothiazide (MICARDIS HCT) 80-12.5 MG per  tablet, Take 1 tablet by mouth daily. , Disp: , Rfl:   Allergies: No Known Allergies  Past Medical History, Surgical history, Social history, and Family History were reviewed and updated.  Review of Systems: As above  Physical Exam:  height is 5\' 11"  (1.803 m) and weight is 203 lb (92.08 kg). His oral temperature is 97.8 F (36.6 C). His blood pressure is 127/65 and his pulse is 79. His respiration is 16.   Head and neck exam shows no ocular or oral lesions. He has no palpable cervical or supraclavicular lymph nodes. Thyroid is nonpalpable.Lungs are clear. Cardiac exam regular rate and rhythm with a normal S1 and S2. There are no murmurs, rubs or bruits. . Abdomen is soft. Has good bowel sounds. There is no palpable abdominal mass. There is no  palpable liver or spleen. Extremities shows no clubbing cyanosis or edema. Skin exam no rashes ecchymosis or petechia.  Neurological exam is nonfocal.  Lab Results  Component Value Date   WBC 5.1 01/04/2015   HGB 16.8 01/04/2015   HCT 48.3 01/04/2015   MCV 98 01/04/2015   PLT 126* 01/04/2015     Chemistry      Component Value Date/Time   NA 140 01/04/2015 0834   NA 141 10/14/2014 1323   NA 138 04/16/2013 1133   K 5.0 01/04/2015 0834   K 4.3 10/14/2014 1323   K 4.2 04/16/2013 1133   CL 104 10/14/2014 1323   CL 103 04/16/2013 1133   CO2 26 01/04/2015 0834   CO2 31 10/14/2014 1323   CO2 27 04/16/2013 1133   BUN 19.8 01/04/2015 0834   BUN 13 10/14/2014 1323   BUN 18 04/16/2013 1133   CREATININE 1.0 01/04/2015 0834   CREATININE 1.2 10/14/2014 1323   CREATININE 1.12 04/16/2013 1133      Component Value Date/Time   CALCIUM 9.0 01/04/2015 0834   CALCIUM 8.8 10/14/2014 1323   CALCIUM 8.9 04/16/2013 1133   ALKPHOS 58 01/04/2015 0834   ALKPHOS 51 10/14/2014 1323   ALKPHOS 45 04/16/2013 1133   AST 18 01/04/2015 0834   AST 24 10/14/2014 1323   AST 22 04/16/2013 1133   ALT 24 01/04/2015 0834   ALT 22 10/14/2014 1323   ALT 26 04/16/2013 1133   BILITOT 1.60* 01/04/2015 0834   BILITOT 1.50 10/14/2014 1323   BILITOT 1.1 04/16/2013 1133         Impression and Plan: Rickey [name] is a 74 year old gentleman with kappa light chain myeloma. He got into a remission currently quickly. We then got him to transplant.   It has been about 6 years since transplant.  We have him on salvage therapy. I really expect that the [name] will be approved. We are working on getting this taken care of. Again, this is all, clearly, a issue of money. Even though this would be considered off label use by the FDA, there are studies that clearly show the effectiveness of the combination of Ninlaro with Pomalidomide.  We will plan to get a Rickey [name] back in about 1 month. I think that will be a very  critical visit so that we can see how well his light chains are responding.  I spent about 35 minutes with he and his wife.    [name], MD 8/10/20165:35 PM

## 2015-01-05 ENCOUNTER — Ambulatory Visit: Payer: Medicare Other | Admitting: Hematology & Oncology

## 2015-01-05 ENCOUNTER — Other Ambulatory Visit: Payer: Medicare Other

## 2015-01-06 LAB — KAPPA/LAMBDA LIGHT CHAINS
KAPPA FREE LGHT CHN: 73 mg/dL — AB (ref 0.33–1.94)
Kappa:Lambda Ratio: 57.03 — ABNORMAL HIGH (ref 0.26–1.65)
Lambda Free Lght Chn: 1.28 mg/dL (ref 0.57–2.63)

## 2015-01-06 LAB — SPEP & IFE WITH QIG
ALBUMIN ELP: 3.5 g/dL — AB (ref 3.8–4.8)
Alpha-1-Globulin: 0.3 g/dL (ref 0.2–0.3)
Alpha-2-Globulin: 0.7 g/dL (ref 0.5–0.9)
Beta 2: 0.3 g/dL (ref 0.2–0.5)
Beta Globulin: 0.3 g/dL — ABNORMAL LOW (ref 0.4–0.6)
Gamma Globulin: 0.8 g/dL (ref 0.8–1.7)
IGA: 131 mg/dL (ref 68–379)
IGG (IMMUNOGLOBIN G), SERUM: 717 mg/dL (ref 650–1600)
IgM, Serum: 34 mg/dL — ABNORMAL LOW (ref 41–251)
TOTAL PROTEIN, SERUM ELECTROPHOR: 6 g/dL — AB (ref 6.1–8.1)

## 2015-01-09 ENCOUNTER — Telehealth: Payer: Self-pay

## 2015-01-09 ENCOUNTER — Other Ambulatory Visit: Payer: Self-pay

## 2015-01-09 MED ORDER — ACYCLOVIR 400 MG PO TABS: 400.0000 mg | ORAL_TABLET | Freq: Two times a day (BID) | ORAL | Status: DC

## 2015-01-09 NOTE — Telephone Encounter (Signed)
Received call from Charlie with [name] with instructions to resubmit clinical information along with a letter of medical necessity for appeal of the denial of Ninlaro.   Letter of medical necessity composed according to guidelines on [employer] and faxed along with medical records and Clinical trial evidence of effectiveness of utilizing ninlaro in combination with pomalyst.  Faxed to [employer] at [phone].  dph

## 2015-01-18 ENCOUNTER — Other Ambulatory Visit: Payer: Self-pay | Admitting: *Deleted

## 2015-01-18 DIAGNOSIS — S91332A Puncture wound without foreign body, left foot, initial encounter: Secondary | ICD-10-CM

## 2015-01-18 DIAGNOSIS — C9000 Multiple myeloma not having achieved remission: Secondary | ICD-10-CM

## 2015-01-18 MED ORDER — IXAZOMIB CITRATE 4 MG PO CAPS: ORAL_CAPSULE | ORAL | Status: DC

## 2015-01-20 ENCOUNTER — Other Ambulatory Visit: Payer: BLUE CROSS/BLUE SHIELD

## 2015-01-20 ENCOUNTER — Telehealth: Payer: Self-pay | Admitting: Hematology & Oncology

## 2015-01-20 ENCOUNTER — Ambulatory Visit: Payer: Medicare Other | Admitting: Hematology & Oncology

## 2015-01-20 NOTE — Telephone Encounter (Signed)
Returned pt's call regarding 9/14 appt

## 2015-01-24 DIAGNOSIS — C9000 Multiple myeloma not having achieved remission: Secondary | ICD-10-CM | POA: Diagnosis not present

## 2015-01-24 DIAGNOSIS — Z6827 Body mass index (BMI) 27.0-27.9, adult: Secondary | ICD-10-CM | POA: Diagnosis not present

## 2015-01-24 DIAGNOSIS — E109 Type 1 diabetes mellitus without complications: Secondary | ICD-10-CM | POA: Diagnosis not present

## 2015-01-24 DIAGNOSIS — I10 Essential (primary) hypertension: Secondary | ICD-10-CM | POA: Diagnosis not present

## 2015-02-01 ENCOUNTER — Other Ambulatory Visit: Payer: Self-pay | Admitting: *Deleted

## 2015-02-01 DIAGNOSIS — S91332A Puncture wound without foreign body, left foot, initial encounter: Secondary | ICD-10-CM

## 2015-02-01 DIAGNOSIS — C9000 Multiple myeloma not having achieved remission: Secondary | ICD-10-CM

## 2015-02-01 MED ORDER — POMALIDOMIDE 4 MG PO CAPS: 4.0000 mg | ORAL_CAPSULE | Freq: Every day | ORAL | Status: DC

## 2015-02-06 ENCOUNTER — Encounter: Payer: Self-pay | Admitting: *Deleted

## 2015-02-06 ENCOUNTER — Encounter: Payer: Self-pay | Admitting: Hematology & Oncology

## 2015-02-06 ENCOUNTER — Ambulatory Visit (HOSPITAL_BASED_OUTPATIENT_CLINIC_OR_DEPARTMENT_OTHER): Payer: Medicare Other | Admitting: Hematology & Oncology

## 2015-02-06 ENCOUNTER — Other Ambulatory Visit (HOSPITAL_BASED_OUTPATIENT_CLINIC_OR_DEPARTMENT_OTHER): Payer: Medicare Other

## 2015-02-06 VITALS — BP 124/6 | HR 63 | Temp 97.8°F | Resp 16 | Ht 71.0 in | Wt 205.0 lb

## 2015-02-06 DIAGNOSIS — C9000 Multiple myeloma not having achieved remission: Secondary | ICD-10-CM

## 2015-02-06 DIAGNOSIS — E103219 Type 1 diabetes mellitus with mild nonproliferative diabetic retinopathy with macular edema, unspecified eye: Secondary | ICD-10-CM

## 2015-02-06 LAB — CBC WITH DIFFERENTIAL (CANCER CENTER ONLY)
BASO#: 0.1 10*3/uL (ref 0.0–0.2)
BASO%: 1.9 % (ref 0.0–2.0)
EOS ABS: 0.1 10*3/uL (ref 0.0–0.5)
EOS%: 1.9 % (ref 0.0–7.0)
HCT: 46.1 % (ref 38.7–49.9)
HGB: 15.8 g/dL (ref 13.0–17.1)
LYMPH#: 1.6 10*3/uL (ref 0.9–3.3)
LYMPH%: 27.6 % (ref 14.0–48.0)
MCH: 33.6 pg — AB (ref 28.0–33.4)
MCHC: 34.3 g/dL (ref 32.0–35.9)
MCV: 98 fL (ref 82–98)
MONO#: 0.7 10*3/uL (ref 0.1–0.9)
MONO%: 12.8 % (ref 0.0–13.0)
NEUT%: 55.8 % (ref 40.0–80.0)
NEUTROS ABS: 3.2 10*3/uL (ref 1.5–6.5)
PLATELETS: 153 10*3/uL (ref 145–400)
RBC: 4.7 10*6/uL (ref 4.20–5.70)
RDW: 13.4 % (ref 11.1–15.7)
WBC: 5.8 10*3/uL (ref 4.0–10.0)

## 2015-02-06 LAB — CMP (CANCER CENTER ONLY)
ALT(SGPT): 22 U/L (ref 10–47)
AST: 25 U/L (ref 11–38)
Albumin: 3.2 g/dL — ABNORMAL LOW (ref 3.3–5.5)
Alkaline Phosphatase: 44 U/L (ref 26–84)
BILIRUBIN TOTAL: 1.1 mg/dL (ref 0.20–1.60)
BUN: 17 mg/dL (ref 7–22)
CHLORIDE: 99 meq/L (ref 98–108)
CO2: 28 mEq/L (ref 18–33)
CREATININE: 1.3 mg/dL — AB (ref 0.6–1.2)
Calcium: 8.5 mg/dL (ref 8.0–10.3)
Glucose, Bld: 198 mg/dL — ABNORMAL HIGH (ref 73–118)
Potassium: 4 mEq/L (ref 3.3–4.7)
SODIUM: 136 meq/L (ref 128–145)
TOTAL PROTEIN: 5.7 g/dL — AB (ref 6.4–8.1)

## 2015-02-06 NOTE — Progress Notes (Signed)
Hematology and Oncology Follow Up Visit  Rickey [name] [id number] 09/24/1940 74 y.o. 02/06/2015   Principle Diagnosis:   Kappa light chain myeloma-clinical relapse  Current Therapy:   Patient is s/p cycle #2 Ninlaro/Pomalyst (Ninlaro not yet approved by insurance company!!)      Interim History:  Rickey [name] is back for followup. Unfortunately, the [name] has not yet been given to him. For some reason, his insurance company is not allowing [place] to use it with Pomalidomide.   I just do not think that his insurance company is going to allow [place] to use Ninlaro with Pomalidomide.  By his last myeloma studies, his light chain is going up slowly. As such, I think we really need to add a proteasome inhibitor to the Pomalidomide. As such, I will see about weekly Velcade.  He did well with Velcade we first treated him. Hopefully, we will still get some synergy with this.  [name] It would be a whole lot easier for him to be given the Velcade close to home. We will see if the local [name] up in [place] will be able to administer the Velcade.  With his last myeloma studies, his Kappa Light chain was up to 73 mg/dL. Again, this has been slowly going up.  He feels well. He's had no process with diabetes. He has an insulin pump.  He's had no bone pain. He's had no change in bowel or bladder habits.  His been no cough.   Overall, his performance status is ECOG 1.    Medications:  Current outpatient prescriptions:  .  acyclovir (ZOVIRAX) 400 MG tablet, Take 1 tablet (400 mg total) by mouth 2 (two) times daily., Disp: 60 tablet, Rfl: 12 .  amLODipine (NORVASC) 2.5 MG tablet, Take 2.5 mg by mouth daily. , Disp: , Rfl:  .  aspirin 81 MG tablet, Take 81 mg by mouth daily.  , Disp: , Rfl:  .  atorvastatin (LIPITOR) 10 MG tablet, Take 10 mg by mouth daily.  , Disp: , Rfl:  .  calcium-vitamin D (OSCAL) 250-125 MG-UNIT per tablet, Take 1 tablet by mouth daily. ,  Disp: , Rfl:  .  Cholecalciferol (VITAMIN D) 2000 UNITS CAPS, Take by mouth every morning., Disp: , Rfl:  .  cycloSPORINE (RESTASIS) 0.05 % ophthalmic emulsion, 1 drop 2 (two) times daily.  , Disp: , Rfl:  .  famciclovir (FAMVIR) 250 MG tablet, Take 1 tablet (250 mg total) by mouth 2 (two) times daily., Disp: 30 tablet, Rfl: 12 .  fish oil-omega-3 fatty acids 1000 MG capsule, Take 2 g by mouth 2 (two) times daily. , Disp: , Rfl:  .  insulin lispro (HUMALOG) 100 UNIT/ML injection, Inject into the skin. Insulin pump, Disp: , Rfl:  .  ixazomib citrate (NINLARO) 4 MG capsule, Take 1 capsule a week for 3 weeks on empty stomach then off for 1 week, Disp: 3 capsule, Rfl: 6 .  montelukast (SINGULAIR) 10 MG tablet, Take 10 mg by mouth at bedtime.  , Disp: , Rfl:  .  MYRBETRIQ 50 MG TB24 tablet, TK 1 T PO QD, Disp: , Rfl: 4 .  pomalidomide (POMALYST) 4 MG capsule, Take 1 capsule (4 mg total) by mouth daily. Take with water on days 1-21. Repeat every 28 days. [id number], Disp: 21 capsule, Rfl: 0 .  prochlorperazine (COMPAZINE) 10 MG tablet, Take 1 tablet (10 mg total) by mouth every 6 (six) hours as needed for nausea  or vomiting., Disp: 60 tablet, Rfl: 0 .  tamsulosin (FLOMAX) 0.4 MG CAPS capsule, TK 1 C PO QD HS, Disp: , Rfl: 4 .  telmisartan-hydrochlorothiazide (MICARDIS HCT) 80-12.5 MG per tablet, Take 1 tablet by mouth daily. , Disp: , Rfl:   Allergies: No Known Allergies  Past Medical History, Surgical history, Social history, and Family History were reviewed and updated.  Review of Systems: As above  Physical Exam:  height is 5\' 11"  (1.803 m) and weight is 205 lb (92.987 kg). His oral temperature is 97.8 F (36.6 C). His blood pressure is 124/6 and his pulse is 63. His respiration is 16.   Head and neck exam shows no ocular or oral lesions. He has no palpable cervical or supraclavicular lymph nodes. Thyroid is nonpalpable.Lungs are clear. Cardiac exam regular rate and rhythm with a normal S1  and S2. There are no murmurs, rubs or bruits. . Abdomen is soft. He has good bowel sounds. There is no palpable abdominal mass. There is no palpable liver or spleen. Back exam shows no tenderness over the spine, ribs or hips. Extremities shows no clubbing cyanosis or edema. Skin exam no rashes ecchymosis or petechia.  Neurological exam is nonfocal.  Lab Results  Component Value Date   WBC 5.8 02/06/2015   HGB 15.8 02/06/2015   HCT 46.1 02/06/2015   MCV 98 02/06/2015   PLT 153 02/06/2015     Chemistry      Component Value Date/Time   NA 136 02/06/2015 1207   NA 140 01/04/2015 0834   NA 138 04/16/2013 1133   K 4.0 02/06/2015 1207   K 5.0 01/04/2015 0834   K 4.2 04/16/2013 1133   CL 99 02/06/2015 1207   CL 103 04/16/2013 1133   CO2 28 02/06/2015 1207   CO2 26 01/04/2015 0834   CO2 27 04/16/2013 1133   BUN 17 02/06/2015 1207   BUN 19.8 01/04/2015 0834   BUN 18 04/16/2013 1133   CREATININE 1.3* 02/06/2015 1207   CREATININE 1.0 01/04/2015 0834   CREATININE 1.12 04/16/2013 1133      Component Value Date/Time   CALCIUM 8.5 02/06/2015 1207   CALCIUM 9.0 01/04/2015 0834   CALCIUM 8.9 04/16/2013 1133   ALKPHOS 44 02/06/2015 1207   ALKPHOS 58 01/04/2015 0834   ALKPHOS 45 04/16/2013 1133   AST 25 02/06/2015 1207   AST 18 01/04/2015 0834   AST 22 04/16/2013 1133   ALT 22 02/06/2015 1207   ALT 24 01/04/2015 0834   ALT 26 04/16/2013 1133   BILITOT 1.10 02/06/2015 1207   BILITOT 1.60* 01/04/2015 0834   BILITOT 1.1 04/16/2013 1133         Impression and Plan: Rickey [name] is a 74 year old gentleman with kappa light chain myeloma. He got into a remission currently quickly. We then got him to transplant.   It has been about 6 years since transplant.  We have him on salvage therapy. Again, we will see if Velcade can be given close to home. I think that it would be a whole lot easier for him if this can be done locally.  The dose of Velcade will be 1.3 mg/m subcutaneous weekly  for 3 weeks on and 1 week off.  I will plan to see him back here in another month. Hopefully, we will start see his light chain start to come down.  I think another option for him would be the use of Kyprolis with Cytoxan. I think this would also be  reasonable. Again, this could be administered close to home.  I spent about 35 minutes with he and his wife.    [name], MD 9/12/20162:45 PM

## 2015-02-06 NOTE — Progress Notes (Signed)
Dr Marin Olp would like patient referred to [facility] for collaboration of care. Patient is to receive weekly Velcade injections, and with him living it [facility], it will be easier for him to go to a local cancer center. Spoke to nurse navigator at [facility] and will fax medical records to [phone]. They will review records and notify this office of availability for collaboration of care.

## 2015-02-07 ENCOUNTER — Other Ambulatory Visit: Payer: BLUE CROSS/BLUE SHIELD

## 2015-02-07 ENCOUNTER — Ambulatory Visit: Payer: Medicare Other | Admitting: Hematology & Oncology

## 2015-02-08 ENCOUNTER — Other Ambulatory Visit: Payer: BLUE CROSS/BLUE SHIELD

## 2015-02-08 ENCOUNTER — Ambulatory Visit: Payer: Medicare Other | Admitting: Hematology & Oncology

## 2015-02-08 LAB — KAPPA/LAMBDA LIGHT CHAINS
KAPPA FREE LGHT CHN: 67.6 mg/dL — AB (ref 0.33–1.94)
Kappa:Lambda Ratio: 60.9 — ABNORMAL HIGH (ref 0.26–1.65)
LAMBDA FREE LGHT CHN: 1.11 mg/dL (ref 0.57–2.63)

## 2015-02-08 LAB — PROTEIN ELECTROPHORESIS, SERUM, WITH REFLEX
ALPHA-1-GLOBULIN: 0.3 g/dL (ref 0.2–0.3)
ALPHA-2-GLOBULIN: 0.6 g/dL (ref 0.5–0.9)
Albumin ELP: 3.5 g/dL — ABNORMAL LOW (ref 3.8–4.8)
BETA GLOBULIN: 0.3 g/dL — AB (ref 0.4–0.6)
Beta 2: 0.3 g/dL (ref 0.2–0.5)
GAMMA GLOBULIN: 0.8 g/dL (ref 0.8–1.7)
Total Protein, Serum Electrophoresis: 5.8 g/dL — ABNORMAL LOW (ref 6.1–8.1)

## 2015-02-08 LAB — IGG, IGA, IGM
IGA: 132 mg/dL (ref 68–379)
IGG (IMMUNOGLOBIN G), SERUM: 767 mg/dL (ref 650–1600)
IGM, SERUM: 35 mg/dL — AB (ref 41–251)

## 2015-02-27 ENCOUNTER — Other Ambulatory Visit: Payer: Self-pay | Admitting: *Deleted

## 2015-02-27 DIAGNOSIS — Z794 Long term (current) use of insulin: Secondary | ICD-10-CM | POA: Diagnosis not present

## 2015-02-27 DIAGNOSIS — Z8042 Family history of malignant neoplasm of prostate: Secondary | ICD-10-CM | POA: Diagnosis not present

## 2015-02-27 DIAGNOSIS — C9000 Multiple myeloma not having achieved remission: Secondary | ICD-10-CM

## 2015-02-27 DIAGNOSIS — S91332A Puncture wound without foreign body, left foot, initial encounter: Secondary | ICD-10-CM

## 2015-02-27 DIAGNOSIS — Z9641 Presence of insulin pump (external) (internal): Secondary | ICD-10-CM | POA: Diagnosis not present

## 2015-02-27 DIAGNOSIS — E109 Type 1 diabetes mellitus without complications: Secondary | ICD-10-CM | POA: Diagnosis not present

## 2015-02-27 DIAGNOSIS — C9002 Multiple myeloma in relapse: Secondary | ICD-10-CM | POA: Diagnosis not present

## 2015-02-27 MED ORDER — POMALIDOMIDE 4 MG PO CAPS: 4.0000 mg | ORAL_CAPSULE | Freq: Every day | ORAL | Status: DC

## 2015-02-28 ENCOUNTER — Telehealth: Payer: Self-pay | Admitting: *Deleted

## 2015-02-28 NOTE — Telephone Encounter (Signed)
Patient wanted Dr Marin Olp to know that he will be starting Velcade at the [facility] in [place] next Tuesday pending insurance approval. He is seeing Dr Veleta Miners.   Dr Marin Olp aware.

## 2015-03-07 DIAGNOSIS — C9002 Multiple myeloma in relapse: Secondary | ICD-10-CM | POA: Diagnosis not present

## 2015-03-13 ENCOUNTER — Ambulatory Visit (HOSPITAL_BASED_OUTPATIENT_CLINIC_OR_DEPARTMENT_OTHER): Payer: Medicare Other

## 2015-03-13 ENCOUNTER — Other Ambulatory Visit (HOSPITAL_BASED_OUTPATIENT_CLINIC_OR_DEPARTMENT_OTHER): Payer: Medicare Other

## 2015-03-13 ENCOUNTER — Encounter: Payer: Self-pay | Admitting: Hematology & Oncology

## 2015-03-13 ENCOUNTER — Ambulatory Visit (HOSPITAL_BASED_OUTPATIENT_CLINIC_OR_DEPARTMENT_OTHER): Payer: Medicare Other | Admitting: Hematology & Oncology

## 2015-03-13 VITALS — BP 137/54 | HR 62 | Temp 97.2°F | Resp 16 | Ht 71.0 in | Wt 215.0 lb

## 2015-03-13 DIAGNOSIS — Z23 Encounter for immunization: Secondary | ICD-10-CM

## 2015-03-13 DIAGNOSIS — E103219 Type 1 diabetes mellitus with mild nonproliferative diabetic retinopathy with macular edema, unspecified eye: Secondary | ICD-10-CM

## 2015-03-13 DIAGNOSIS — C9000 Multiple myeloma not having achieved remission: Secondary | ICD-10-CM

## 2015-03-13 LAB — CBC WITH DIFFERENTIAL (CANCER CENTER ONLY)
BASO#: 0.2 10*3/uL (ref 0.0–0.2)
BASO%: 4.3 % — ABNORMAL HIGH (ref 0.0–2.0)
EOS%: 6 % (ref 0.0–7.0)
Eosinophils Absolute: 0.3 10*3/uL (ref 0.0–0.5)
HEMATOCRIT: 45.5 % (ref 38.7–49.9)
HGB: 15.5 g/dL (ref 13.0–17.1)
LYMPH#: 1.5 10*3/uL (ref 0.9–3.3)
LYMPH%: 35.1 % (ref 14.0–48.0)
MCH: 33.6 pg — ABNORMAL HIGH (ref 28.0–33.4)
MCHC: 34.1 g/dL (ref 32.0–35.9)
MCV: 99 fL — AB (ref 82–98)
MONO#: 0.8 10*3/uL (ref 0.1–0.9)
MONO%: 18.4 % — ABNORMAL HIGH (ref 0.0–13.0)
NEUT#: 1.5 10*3/uL (ref 1.5–6.5)
NEUT%: 36.2 % — AB (ref 40.0–80.0)
PLATELETS: 153 10*3/uL (ref 145–400)
RBC: 4.61 10*6/uL (ref 4.20–5.70)
RDW: 14.7 % (ref 11.1–15.7)
WBC: 4.2 10*3/uL (ref 4.0–10.0)

## 2015-03-13 LAB — COMPREHENSIVE METABOLIC PANEL (CC13)
ALT: 27 U/L (ref 0–55)
ANION GAP: 6 meq/L (ref 3–11)
AST: 28 U/L (ref 5–34)
Albumin: 3.5 g/dL (ref 3.5–5.0)
Alkaline Phosphatase: 45 U/L (ref 40–150)
BUN: 14.4 mg/dL (ref 7.0–26.0)
CALCIUM: 8.7 mg/dL (ref 8.4–10.4)
CHLORIDE: 105 meq/L (ref 98–109)
CO2: 28 mEq/L (ref 22–29)
Creatinine: 1.1 mg/dL (ref 0.7–1.3)
EGFR: 68 mL/min/{1.73_m2} — ABNORMAL LOW (ref >90)
Glucose: 83 mg/dl (ref 70–140)
POTASSIUM: 4 meq/L (ref 3.5–5.1)
Sodium: 140 mEq/L (ref 136–145)
Total Bilirubin: 0.79 mg/dL (ref 0.20–1.20)
Total Protein: 6.1 g/dL — ABNORMAL LOW (ref 6.4–8.3)

## 2015-03-13 MED ORDER — INFLUENZA VAC SPLIT QUAD 0.5 ML IM SUSY
0.5000 mL | PREFILLED_SYRINGE | INTRAMUSCULAR | Status: AC
  Administered 2015-03-13: 0.5 mL via INTRAMUSCULAR
  Filled 2015-03-13: qty 0.5

## 2015-03-13 NOTE — Progress Notes (Signed)
Hematology and Oncology Follow Up Visit  CARVEL HUSKINS [id number] [birth year] 74 y.o. 03/13/2015   Principle Diagnosis:   Kappa light chain myeloma-clinical relapse  Current Therapy:   Patient is s/p cycle #3 Pomalyst  He is getting weekly Velcade up in [facility] - status post 1 treatment    Interim History:  Mr.  Dray is back for followup. He now is on Velcade along with the Pomalidomide. We were able to get the cancer center open wound to be oh to do this for [place]. So far, he said 1 treatment.  He tolerated it well.  He says his blood sugars wrack she will bit lower. I'm not sure as to why that would be. He also says that there is some visual changes. Velcade, typically does not cause any type of visual problems.  His last Kappa Lightchain was down to 67.3 mg/dL. This is an improvement from the level in August 1970 3 mg/dL.  With the upcoming holidays, they will be traveling. We will have to make arrangements for him to have a treatment locally.  He's had no problems with bowels or bladder. He does get a little bit constipated.  He's had no problems with nausea or vomiting.  This been no leg swelling.  He's had no cough or shortness of breath.  His been no bleeding.  Overall, his performance status is ECOG 1  Medications:  Current outpatient prescriptions:  .  acyclovir (ZOVIRAX) 400 MG tablet, Take 1 tablet (400 mg total) by mouth 2 (two) times daily., Disp: 60 tablet, Rfl: 12 .  amLODipine (NORVASC) 2.5 MG tablet, Take 2.5 mg by mouth daily. , Disp: , Rfl:  .  aspirin 81 MG tablet, Take 81 mg by mouth daily.  , Disp: , Rfl:  .  atorvastatin (LIPITOR) 10 MG tablet, Take 10 mg by mouth daily.  , Disp: , Rfl:  .  calcium-vitamin D (OSCAL) 250-125 MG-UNIT per tablet, Take 1 tablet by mouth daily. , Disp: , Rfl:  .  Cholecalciferol (VITAMIN D) 2000 UNITS CAPS, Take by mouth every morning., Disp: , Rfl:  .  cycloSPORINE (RESTASIS) 0.05 % ophthalmic emulsion, 1 drop 2 (two)  times daily.  , Disp: , Rfl:  .  fish oil-omega-3 fatty acids 1000 MG capsule, Take 2 g by mouth 2 (two) times daily. , Disp: , Rfl:  .  insulin lispro (HUMALOG) 100 UNIT/ML injection, Inject into the skin. Insulin pump, Disp: , Rfl:  .  montelukast (SINGULAIR) 10 MG tablet, Take 10 mg by mouth at bedtime.  , Disp: , Rfl:  .  MYRBETRIQ 50 MG TB24 tablet, TK 1 T PO QD, Disp: , Rfl: 4 .  pomalidomide (POMALYST) 4 MG capsule, Take 1 capsule (4 mg total) by mouth daily. Take with water on days 1-21. Repeat every 28 days. [id number], Disp: 21 capsule, Rfl: 0 .  prochlorperazine (COMPAZINE) 10 MG tablet, Take 1 tablet (10 mg total) by mouth every 6 (six) hours as needed for nausea or vomiting., Disp: 60 tablet, Rfl: 0 .  tamsulosin (FLOMAX) 0.4 MG CAPS capsule, TK 1 C PO QD HS, Disp: , Rfl: 4 .  telmisartan-hydrochlorothiazide (MICARDIS HCT) 80-12.5 MG per tablet, Take 1 tablet by mouth daily. , Disp: , Rfl:   Allergies: No Known Allergies  Past Medical History, Surgical history, Social history, and Family History were reviewed and updated.  Review of Systems: As above  Physical Exam:  height is 5\' 11"  (1.803 m) and weight is 215 lb (  97.523 kg). His oral temperature is 97.2 F (36.2 C). His blood pressure is 137/54 and his pulse is 62. His respiration is 16.   Head and neck exam shows no ocular or oral lesions. He has no palpable cervical or supraclavicular lymph nodes. Thyroid is nonpalpable.Lungs are clear. Cardiac exam regular rate and rhythm with a normal S1 and S2. There are no murmurs, rubs or bruits. . Abdomen is soft. He has good bowel sounds. There is no palpable abdominal mass. There is no palpable liver or spleen. Back exam shows no tenderness over the spine, ribs or hips. Extremities shows no clubbing cyanosis or edema. Skin exam no rashes ecchymosis or petechia.  Neurological exam is nonfocal.  Lab Results  Component Value Date   WBC 4.2 03/13/2015   HGB 15.5 03/13/2015   HCT 45.5  03/13/2015   MCV 99* 03/13/2015   PLT 153 03/13/2015     Chemistry      Component Value Date/Time   NA 136 02/06/2015 1207   NA 140 01/04/2015 0834   NA 138 04/16/2013 1133   K 4.0 02/06/2015 1207   K 5.0 01/04/2015 0834   K 4.2 04/16/2013 1133   CL 99 02/06/2015 1207   CL 103 04/16/2013 1133   CO2 28 02/06/2015 1207   CO2 26 01/04/2015 0834   CO2 27 04/16/2013 1133   BUN 17 02/06/2015 1207   BUN 19.8 01/04/2015 0834   BUN 18 04/16/2013 1133   CREATININE 1.3* 02/06/2015 1207   CREATININE 1.0 01/04/2015 0834   CREATININE 1.12 04/16/2013 1133      Component Value Date/Time   CALCIUM 8.5 02/06/2015 1207   CALCIUM 9.0 01/04/2015 0834   CALCIUM 8.9 04/16/2013 1133   ALKPHOS 44 02/06/2015 1207   ALKPHOS 58 01/04/2015 0834   ALKPHOS 45 04/16/2013 1133   AST 25 02/06/2015 1207   AST 18 01/04/2015 0834   AST 22 04/16/2013 1133   ALT 22 02/06/2015 1207   ALT 24 01/04/2015 0834   ALT 26 04/16/2013 1133   BILITOT 1.10 02/06/2015 1207   BILITOT 1.60* 01/04/2015 0834   BILITOT 1.1 04/16/2013 1133         Impression and Plan: Mr. Choung is a 74 year old gentleman with kappa light chain myeloma. He got into a remission currently quickly. We then got him to transplant.   It has been about 6 years since transplant.  We have him on salvage therapy. I am pretty confident that the Velcade along with the Pomalidomide will work.  We will see what his light chain studies are.  I will plan to get him back in November.  I spent about 35 minutes with he and his wife.    [name], MD 10/17/20162:40 PM

## 2015-03-13 NOTE — Patient Instructions (Signed)

## 2015-03-14 DIAGNOSIS — C9002 Multiple myeloma in relapse: Secondary | ICD-10-CM | POA: Diagnosis not present

## 2015-03-14 DIAGNOSIS — Z5111 Encounter for antineoplastic chemotherapy: Secondary | ICD-10-CM | POA: Diagnosis not present

## 2015-03-15 LAB — PROTEIN ELECTROPHORESIS, SERUM, WITH REFLEX
ALBUMIN ELP: 3.9 g/dL (ref 3.8–4.8)
Alpha-1-Globulin: 0.3 g/dL (ref 0.2–0.3)
Alpha-2-Globulin: 0.6 g/dL (ref 0.5–0.9)
BETA 2: 0.2 g/dL (ref 0.2–0.5)
Beta Globulin: 0.3 g/dL — ABNORMAL LOW (ref 0.4–0.6)
Gamma Globulin: 0.7 g/dL — ABNORMAL LOW (ref 0.8–1.7)
TOTAL PROTEIN, SERUM ELECTROPHOR: 5.9 g/dL — AB (ref 6.1–8.1)

## 2015-03-15 LAB — IGG, IGA, IGM
IGA: 139 mg/dL (ref 68–379)
IGG (IMMUNOGLOBIN G), SERUM: 660 mg/dL (ref 650–1600)
IgM, Serum: 34 mg/dL — ABNORMAL LOW (ref 41–251)

## 2015-03-15 LAB — KAPPA/LAMBDA LIGHT CHAINS
KAPPA FREE LGHT CHN: 67.4 mg/dL — AB (ref 0.33–1.94)
Kappa:Lambda Ratio: 40.85 — ABNORMAL HIGH (ref 0.26–1.65)
LAMBDA FREE LGHT CHN: 1.65 mg/dL (ref 0.57–2.63)

## 2015-03-21 DIAGNOSIS — Z5111 Encounter for antineoplastic chemotherapy: Secondary | ICD-10-CM | POA: Diagnosis not present

## 2015-03-21 DIAGNOSIS — C9002 Multiple myeloma in relapse: Secondary | ICD-10-CM | POA: Diagnosis not present

## 2015-03-27 ENCOUNTER — Other Ambulatory Visit: Payer: Self-pay | Admitting: *Deleted

## 2015-03-27 DIAGNOSIS — S91332A Puncture wound without foreign body, left foot, initial encounter: Secondary | ICD-10-CM

## 2015-03-27 DIAGNOSIS — C9000 Multiple myeloma not having achieved remission: Secondary | ICD-10-CM

## 2015-03-27 MED ORDER — POMALIDOMIDE 4 MG PO CAPS: 4.0000 mg | ORAL_CAPSULE | Freq: Every day | ORAL | Status: DC

## 2015-04-03 NOTE — Progress Notes (Signed)
Received fax confirmation for [employer] that pt's Pomalyst has shipped for delivery. dph

## 2015-04-04 DIAGNOSIS — Z5111 Encounter for antineoplastic chemotherapy: Secondary | ICD-10-CM | POA: Diagnosis not present

## 2015-04-04 DIAGNOSIS — C9002 Multiple myeloma in relapse: Secondary | ICD-10-CM | POA: Diagnosis not present

## 2015-04-10 DIAGNOSIS — H903 Sensorineural hearing loss, bilateral: Secondary | ICD-10-CM | POA: Diagnosis not present

## 2015-04-10 DIAGNOSIS — H9312 Tinnitus, left ear: Secondary | ICD-10-CM | POA: Diagnosis not present

## 2015-04-10 DIAGNOSIS — H6123 Impacted cerumen, bilateral: Secondary | ICD-10-CM | POA: Diagnosis not present

## 2015-04-11 DIAGNOSIS — C9002 Multiple myeloma in relapse: Secondary | ICD-10-CM | POA: Diagnosis not present

## 2015-04-11 DIAGNOSIS — Z5111 Encounter for antineoplastic chemotherapy: Secondary | ICD-10-CM | POA: Diagnosis not present

## 2015-04-18 DIAGNOSIS — Z5111 Encounter for antineoplastic chemotherapy: Secondary | ICD-10-CM | POA: Diagnosis not present

## 2015-04-18 DIAGNOSIS — C9002 Multiple myeloma in relapse: Secondary | ICD-10-CM | POA: Diagnosis not present

## 2015-04-19 ENCOUNTER — Other Ambulatory Visit (HOSPITAL_BASED_OUTPATIENT_CLINIC_OR_DEPARTMENT_OTHER): Payer: Medicare Other

## 2015-04-19 ENCOUNTER — Encounter: Payer: Self-pay | Admitting: Hematology & Oncology

## 2015-04-19 ENCOUNTER — Ambulatory Visit (HOSPITAL_BASED_OUTPATIENT_CLINIC_OR_DEPARTMENT_OTHER): Payer: Medicare Other | Admitting: Hematology & Oncology

## 2015-04-19 VITALS — BP 116/55 | HR 65 | Temp 97.3°F | Resp 16 | Ht 71.0 in | Wt 213.0 lb

## 2015-04-19 DIAGNOSIS — C9000 Multiple myeloma not having achieved remission: Secondary | ICD-10-CM

## 2015-04-19 DIAGNOSIS — C9002 Multiple myeloma in relapse: Secondary | ICD-10-CM | POA: Diagnosis not present

## 2015-04-19 DIAGNOSIS — E11311 Type 2 diabetes mellitus with unspecified diabetic retinopathy with macular edema: Secondary | ICD-10-CM | POA: Diagnosis not present

## 2015-04-19 LAB — CBC WITH DIFFERENTIAL (CANCER CENTER ONLY)
BASO#: 0.3 10*3/uL — AB (ref 0.0–0.2)
BASO%: 4 % — ABNORMAL HIGH (ref 0.0–2.0)
EOS%: 5.1 % (ref 0.0–7.0)
Eosinophils Absolute: 0.3 10*3/uL (ref 0.0–0.5)
HEMATOCRIT: 43.6 % (ref 38.7–49.9)
HGB: 14.9 g/dL (ref 13.0–17.1)
LYMPH#: 1.2 10*3/uL (ref 0.9–3.3)
LYMPH%: 18.8 % (ref 14.0–48.0)
MCH: 34.3 pg — ABNORMAL HIGH (ref 28.0–33.4)
MCHC: 34.2 g/dL (ref 32.0–35.9)
MCV: 100 fL — ABNORMAL HIGH (ref 82–98)
MONO#: 1.2 10*3/uL — ABNORMAL HIGH (ref 0.1–0.9)
MONO%: 19 % — AB (ref 0.0–13.0)
NEUT%: 53.1 % (ref 40.0–80.0)
NEUTROS ABS: 3.3 10*3/uL (ref 1.5–6.5)
PLATELETS: 145 10*3/uL (ref 145–400)
RBC: 4.35 10*6/uL (ref 4.20–5.70)
RDW: 15.5 % (ref 11.1–15.7)
WBC: 6.2 10*3/uL (ref 4.0–10.0)

## 2015-04-19 LAB — COMPREHENSIVE METABOLIC PANEL (CC13)
ALT: 18 U/L (ref 0–55)
ANION GAP: 7 meq/L (ref 3–11)
AST: 19 U/L (ref 5–34)
Albumin: 3.6 g/dL (ref 3.5–5.0)
Alkaline Phosphatase: 44 U/L (ref 40–150)
BILIRUBIN TOTAL: 0.94 mg/dL (ref 0.20–1.20)
BUN: 17.8 mg/dL (ref 7.0–26.0)
CALCIUM: 8.6 mg/dL (ref 8.4–10.4)
CHLORIDE: 106 meq/L (ref 98–109)
CO2: 26 meq/L (ref 22–29)
Creatinine: 1.3 mg/dL (ref 0.7–1.3)
EGFR: 57 mL/min/{1.73_m2} — AB (ref >90)
Glucose: 139 mg/dl (ref 70–140)
Potassium: 4.4 mEq/L (ref 3.5–5.1)
Sodium: 138 mEq/L (ref 136–145)
Total Protein: 6.1 g/dL — ABNORMAL LOW (ref 6.4–8.3)

## 2015-04-19 LAB — TECHNOLOGIST REVIEW CHCC SATELLITE

## 2015-04-19 LAB — LACTATE DEHYDROGENASE (CC13): LDH: 197 U/L (ref 125–245)

## 2015-04-19 NOTE — Progress Notes (Signed)
Hematology and Oncology Follow Up Visit  CHAOS POKE [id number] 1940/07/17 74 y.o. 04/19/2015   Principle Diagnosis:   Kappa light chain myeloma-clinical relapse  Current Therapy:   Patient is s/p cycle #4 Pomalyst  He is getting weekly Velcade up in [facility] - status post 1 treatment    Interim History:  Rickey [name] is back for followup. He is doing pretty well. He is tolerating treatment nicely. He's had a little bit of fatigue but outside of that, there's been no other problems. He does take some MiraLAX for constipation.  He is getting the Velcade up in [place]. He does had a dose yesterday.  He and his wife have a beach home. They will be going down after Thanksgiving. As such, they will get Velcade down in [facility].  He already has appointment to see the doctor down there.  His last myeloma studies showed his Kappa Lightchain to be 67.40 mg/dL. He does not have a M spike.  His diabetes is doing pretty well. He says is his blood sugars actually have been along the low side. Because of this, he is cutting down on his insulin dose.  There's been no issues with infections. He's had no problems with cough. He's had no rashes. He's had no leg swelling.   Overall, his performance status is ECOG 1  Medications:  Current outpatient prescriptions:  .  acyclovir (ZOVIRAX) 400 MG tablet, Take 1 tablet (400 mg total) by mouth 2 (two) times daily., Disp: 60 tablet, Rfl: 12 .  amLODipine (NORVASC) 2.5 MG tablet, Take 2.5 mg by mouth daily. , Disp: , Rfl:  .  aspirin 81 MG tablet, Take 81 mg by mouth daily.  , Disp: , Rfl:  .  atorvastatin (LIPITOR) 10 MG tablet, Take 10 mg by mouth daily.  , Disp: , Rfl:  .  calcium-vitamin D (OSCAL) 250-125 MG-UNIT per tablet, Take 1 tablet by mouth daily. , Disp: , Rfl:  .  Cholecalciferol (VITAMIN D) 2000 UNITS CAPS, Take by mouth every morning., Disp: , Rfl:  .  cycloSPORINE (RESTASIS) 0.05 % ophthalmic emulsion, 1 drop 2 (two) times daily.  ,  Disp: , Rfl:  .  fish oil-omega-3 fatty acids 1000 MG capsule, Take 2 g by mouth 2 (two) times daily. , Disp: , Rfl:  .  insulin lispro (HUMALOG) 100 UNIT/ML injection, Inject into the skin. Insulin pump, Disp: , Rfl:  .  montelukast (SINGULAIR) 10 MG tablet, Take 10 mg by mouth at bedtime.  , Disp: , Rfl:  .  pomalidomide (POMALYST) 4 MG capsule, Take 1 capsule (4 mg total) by mouth daily. Take with water on days 1-21. Repeat every 28 days. [id number], Disp: 21 capsule, Rfl: 0 .  telmisartan-hydrochlorothiazide (MICARDIS HCT) 80-12.5 MG per tablet, Take 1 tablet by mouth daily. , Disp: , Rfl:   Allergies: No Known Allergies  Past Medical History, Surgical history, Social history, and Family History were reviewed and updated.  Review of Systems: As above  Physical Exam:  height is 5\' 11"  (1.803 m) and weight is 213 lb (96.616 kg). His oral temperature is 97.3 F (36.3 C). His blood pressure is 116/55 and his pulse is 65. His respiration is 16.   Head and neck exam shows no ocular or oral lesions. He has no palpable cervical or supraclavicular lymph nodes. Thyroid is nonpalpable.Lungs are clear. Cardiac exam regular rate and rhythm with a normal S1 and S2. There are no murmurs, rubs or bruits. . Abdomen  is soft. He has good bowel sounds. There is no palpable abdominal mass. There is no palpable liver or spleen. Back exam shows no tenderness over the spine, ribs or hips. Extremities shows no clubbing cyanosis or edema. Skin exam no rashes ecchymosis or petechia.  Neurological exam is nonfocal.  Lab Results  Component Value Date   WBC 6.2 04/19/2015   HGB 14.9 04/19/2015   HCT 43.6 04/19/2015   MCV 100* 04/19/2015   PLT 145 04/19/2015     Chemistry      Component Value Date/Time   NA 140 03/13/2015 1308   NA 136 02/06/2015 1207   NA 138 04/16/2013 1133   K 4.0 03/13/2015 1308   K 4.0 02/06/2015 1207   K 4.2 04/16/2013 1133   CL 99 02/06/2015 1207   CL 103 04/16/2013 1133   CO2  28 03/13/2015 1308   CO2 28 02/06/2015 1207   CO2 27 04/16/2013 1133   BUN 14.4 03/13/2015 1308   BUN 17 02/06/2015 1207   BUN 18 04/16/2013 1133   CREATININE 1.1 03/13/2015 1308   CREATININE 1.3* 02/06/2015 1207   CREATININE 1.12 04/16/2013 1133      Component Value Date/Time   CALCIUM 8.7 03/13/2015 1308   CALCIUM 8.5 02/06/2015 1207   CALCIUM 8.9 04/16/2013 1133   ALKPHOS 45 03/13/2015 1308   ALKPHOS 44 02/06/2015 1207   ALKPHOS 45 04/16/2013 1133   AST 28 03/13/2015 1308   AST 25 02/06/2015 1207   AST 22 04/16/2013 1133   ALT 27 03/13/2015 1308   ALT 22 02/06/2015 1207   ALT 26 04/16/2013 1133   BILITOT 0.79 03/13/2015 1308   BILITOT 1.10 02/06/2015 1207   BILITOT 1.1 04/16/2013 1133         Impression and Plan: Rickey [name] is a 74 year old gentleman with kappa light chain myeloma. He got into a remission currently quickly. We then got him to transplant.   It has been about 61/2 years since transplant.  We have him on salvage therapy. I am pretty confident that the Velcade along with the Pomalidomide will work.  We will see what his light chain studies are.  I will plan to get him back in December.  I spent about 35 minutes with he and his wife.    [name], MD 11/23/20169:38 AM

## 2015-04-21 ENCOUNTER — Telehealth: Payer: Self-pay | Admitting: *Deleted

## 2015-04-21 NOTE — Telephone Encounter (Signed)
-----   Message from [name], MD sent at 04/21/2015  2:41 PM EST ----- Call - light chain down to 27 from 63!!!  This is fantastic!!!!  Merry Christmas!!  pete

## 2015-04-24 ENCOUNTER — Other Ambulatory Visit: Payer: Self-pay | Admitting: *Deleted

## 2015-04-24 DIAGNOSIS — C9000 Multiple myeloma not having achieved remission: Secondary | ICD-10-CM

## 2015-04-24 DIAGNOSIS — S91332A Puncture wound without foreign body, left foot, initial encounter: Secondary | ICD-10-CM

## 2015-04-24 LAB — PROTEIN ELECTROPHORESIS, SERUM, WITH REFLEX
ALBUMIN ELP: 4 g/dL (ref 3.8–4.8)
ALPHA-2-GLOBULIN: 0.6 g/dL (ref 0.5–0.9)
Alpha-1-Globulin: 0.3 g/dL (ref 0.2–0.3)
BETA GLOBULIN: 0.3 g/dL — AB (ref 0.4–0.6)
Beta 2: 0.3 g/dL (ref 0.2–0.5)
Gamma Globulin: 0.7 g/dL — ABNORMAL LOW (ref 0.8–1.7)
Total Protein, Serum Electrophoresis: 6.1 g/dL (ref 6.1–8.1)

## 2015-04-24 LAB — IGG, IGA, IGM
IGA: 132 mg/dL (ref 68–379)
IGM, SERUM: 27 mg/dL — AB (ref 41–251)
IgG (Immunoglobin G), Serum: 770 mg/dL (ref 650–1600)

## 2015-04-24 LAB — KAPPA/LAMBDA LIGHT CHAINS
Kappa free light chain: 27.6 mg/dL — ABNORMAL HIGH (ref 0.33–1.94)
Kappa:Lambda Ratio: 19.86 — ABNORMAL HIGH (ref 0.26–1.65)
Lambda Free Lght Chn: 1.39 mg/dL (ref 0.57–2.63)

## 2015-04-24 MED ORDER — POMALIDOMIDE 4 MG PO CAPS: 4.0000 mg | ORAL_CAPSULE | Freq: Every day | ORAL | Status: DC

## 2015-04-24 NOTE — Telephone Encounter (Addendum)
-----   Message from [name], MD sent at 04/21/2015  2:41 PM EST ----- Call - light chain down to 27 from 71!!!  This is fantastic!!!!  Angela Nevin Christmas!!  Pete  Pt returned call to verify this message d/t poor cell service when message received. Verbalizes understanding and appreciation. dph

## 2015-05-02 DIAGNOSIS — C9000 Multiple myeloma not having achieved remission: Secondary | ICD-10-CM | POA: Diagnosis not present

## 2015-05-03 DIAGNOSIS — C9000 Multiple myeloma not having achieved remission: Secondary | ICD-10-CM | POA: Diagnosis not present

## 2015-05-09 DIAGNOSIS — C9000 Multiple myeloma not having achieved remission: Secondary | ICD-10-CM | POA: Diagnosis not present

## 2015-05-16 DIAGNOSIS — J302 Other seasonal allergic rhinitis: Secondary | ICD-10-CM | POA: Diagnosis not present

## 2015-05-16 DIAGNOSIS — Z0000 Encounter for general adult medical examination without abnormal findings: Secondary | ICD-10-CM | POA: Diagnosis not present

## 2015-05-16 DIAGNOSIS — E109 Type 1 diabetes mellitus without complications: Secondary | ICD-10-CM | POA: Diagnosis not present

## 2015-05-16 DIAGNOSIS — Z6828 Body mass index (BMI) 28.0-28.9, adult: Secondary | ICD-10-CM | POA: Diagnosis not present

## 2015-05-16 DIAGNOSIS — Z1389 Encounter for screening for other disorder: Secondary | ICD-10-CM | POA: Diagnosis not present

## 2015-05-16 DIAGNOSIS — N400 Enlarged prostate without lower urinary tract symptoms: Secondary | ICD-10-CM | POA: Diagnosis not present

## 2015-05-16 DIAGNOSIS — E7800 Pure hypercholesterolemia, unspecified: Secondary | ICD-10-CM | POA: Diagnosis not present

## 2015-05-16 DIAGNOSIS — E291 Testicular hypofunction: Secondary | ICD-10-CM | POA: Diagnosis not present

## 2015-05-16 DIAGNOSIS — I10 Essential (primary) hypertension: Secondary | ICD-10-CM | POA: Diagnosis not present

## 2015-05-16 DIAGNOSIS — C9000 Multiple myeloma not having achieved remission: Secondary | ICD-10-CM | POA: Diagnosis not present

## 2015-05-16 DIAGNOSIS — I4510 Unspecified right bundle-branch block: Secondary | ICD-10-CM | POA: Diagnosis not present

## 2015-05-17 ENCOUNTER — Other Ambulatory Visit: Payer: Medicare Other

## 2015-05-17 ENCOUNTER — Ambulatory Visit: Payer: Medicare Other | Admitting: Hematology & Oncology

## 2015-05-24 ENCOUNTER — Other Ambulatory Visit: Payer: Self-pay | Admitting: *Deleted

## 2015-05-24 DIAGNOSIS — C9000 Multiple myeloma not having achieved remission: Secondary | ICD-10-CM

## 2015-05-24 DIAGNOSIS — S91332A Puncture wound without foreign body, left foot, initial encounter: Secondary | ICD-10-CM

## 2015-05-24 MED ORDER — POMALIDOMIDE 4 MG PO CAPS: 4.0000 mg | ORAL_CAPSULE | Freq: Every day | ORAL | Status: DC

## 2015-05-25 NOTE — Progress Notes (Signed)
Received fax confirmation from [facility] that pt's Pomalyst shipped for delivery on 12/28. dph

## 2015-05-30 DIAGNOSIS — C9000 Multiple myeloma not having achieved remission: Secondary | ICD-10-CM | POA: Diagnosis not present

## 2015-06-06 DIAGNOSIS — C9000 Multiple myeloma not having achieved remission: Secondary | ICD-10-CM | POA: Diagnosis not present

## 2015-06-13 DIAGNOSIS — C9000 Multiple myeloma not having achieved remission: Secondary | ICD-10-CM | POA: Diagnosis not present

## 2015-06-16 ENCOUNTER — Ambulatory Visit: Payer: Medicare Other | Admitting: Hematology & Oncology

## 2015-06-16 ENCOUNTER — Other Ambulatory Visit: Payer: Medicare Other

## 2015-06-19 ENCOUNTER — Other Ambulatory Visit: Payer: Self-pay | Admitting: *Deleted

## 2015-06-19 DIAGNOSIS — C9000 Multiple myeloma not having achieved remission: Secondary | ICD-10-CM

## 2015-06-19 DIAGNOSIS — S91332A Puncture wound without foreign body, left foot, initial encounter: Secondary | ICD-10-CM

## 2015-06-19 MED ORDER — POMALIDOMIDE 4 MG PO CAPS: 4.0000 mg | ORAL_CAPSULE | Freq: Every day | ORAL | Status: DC

## 2015-06-23 DIAGNOSIS — C9000 Multiple myeloma not having achieved remission: Secondary | ICD-10-CM | POA: Diagnosis not present

## 2015-07-03 DIAGNOSIS — C9000 Multiple myeloma not having achieved remission: Secondary | ICD-10-CM | POA: Diagnosis not present

## 2015-07-10 DIAGNOSIS — C9000 Multiple myeloma not having achieved remission: Secondary | ICD-10-CM | POA: Diagnosis not present

## 2015-07-13 ENCOUNTER — Telehealth: Payer: Self-pay | Admitting: Hematology & Oncology

## 2015-07-13 NOTE — Telephone Encounter (Signed)
I called [employer] [place] and they advised the pre cert for the POMALYST is good.  Valid: 75/16 - 11/29/15     I spoke w Benard Halsted J today and they drug is dispensed with  [facility], [place] [address] [place]   P: [phone]

## 2015-07-19 ENCOUNTER — Other Ambulatory Visit: Payer: Self-pay | Admitting: *Deleted

## 2015-07-19 DIAGNOSIS — S91332A Puncture wound without foreign body, left foot, initial encounter: Secondary | ICD-10-CM

## 2015-07-19 DIAGNOSIS — C9000 Multiple myeloma not having achieved remission: Secondary | ICD-10-CM

## 2015-07-19 MED ORDER — POMALIDOMIDE 4 MG PO CAPS: 4.0000 mg | ORAL_CAPSULE | Freq: Every day | ORAL | Status: DC

## 2015-07-25 DIAGNOSIS — C9000 Multiple myeloma not having achieved remission: Secondary | ICD-10-CM | POA: Diagnosis not present

## 2015-08-01 DIAGNOSIS — C9000 Multiple myeloma not having achieved remission: Secondary | ICD-10-CM | POA: Diagnosis not present

## 2015-08-08 DIAGNOSIS — C9000 Multiple myeloma not having achieved remission: Secondary | ICD-10-CM | POA: Diagnosis not present

## 2015-08-16 ENCOUNTER — Other Ambulatory Visit: Payer: Self-pay | Admitting: *Deleted

## 2015-08-16 DIAGNOSIS — C9000 Multiple myeloma not having achieved remission: Secondary | ICD-10-CM

## 2015-08-16 DIAGNOSIS — S91332A Puncture wound without foreign body, left foot, initial encounter: Secondary | ICD-10-CM

## 2015-08-16 MED ORDER — POMALIDOMIDE 4 MG PO CAPS: 4.0000 mg | ORAL_CAPSULE | Freq: Every day | ORAL | Status: DC

## 2015-08-22 DIAGNOSIS — C9000 Multiple myeloma not having achieved remission: Secondary | ICD-10-CM | POA: Diagnosis not present

## 2015-08-29 DIAGNOSIS — C9000 Multiple myeloma not having achieved remission: Secondary | ICD-10-CM | POA: Diagnosis not present

## 2015-09-05 DIAGNOSIS — C9000 Multiple myeloma not having achieved remission: Secondary | ICD-10-CM | POA: Diagnosis not present

## 2015-09-13 ENCOUNTER — Other Ambulatory Visit: Payer: Self-pay | Admitting: *Deleted

## 2015-09-13 DIAGNOSIS — S91332A Puncture wound without foreign body, left foot, initial encounter: Secondary | ICD-10-CM

## 2015-09-13 DIAGNOSIS — C9000 Multiple myeloma not having achieved remission: Secondary | ICD-10-CM

## 2015-09-13 MED ORDER — POMALIDOMIDE 4 MG PO CAPS: 4.0000 mg | ORAL_CAPSULE | Freq: Every day | ORAL | Status: DC

## 2015-09-18 DIAGNOSIS — R5383 Other fatigue: Secondary | ICD-10-CM | POA: Diagnosis not present

## 2015-09-18 DIAGNOSIS — C9002 Multiple myeloma in relapse: Secondary | ICD-10-CM | POA: Diagnosis not present

## 2015-09-19 DIAGNOSIS — Z5111 Encounter for antineoplastic chemotherapy: Secondary | ICD-10-CM | POA: Diagnosis not present

## 2015-09-19 DIAGNOSIS — C9002 Multiple myeloma in relapse: Secondary | ICD-10-CM | POA: Diagnosis not present

## 2015-09-26 DIAGNOSIS — Z5111 Encounter for antineoplastic chemotherapy: Secondary | ICD-10-CM | POA: Diagnosis not present

## 2015-09-26 DIAGNOSIS — C9002 Multiple myeloma in relapse: Secondary | ICD-10-CM | POA: Diagnosis not present

## 2015-10-03 DIAGNOSIS — C9002 Multiple myeloma in relapse: Secondary | ICD-10-CM | POA: Diagnosis not present

## 2015-10-03 DIAGNOSIS — Z5111 Encounter for antineoplastic chemotherapy: Secondary | ICD-10-CM | POA: Diagnosis not present

## 2015-10-05 DIAGNOSIS — Z9849 Cataract extraction status, unspecified eye: Secondary | ICD-10-CM | POA: Diagnosis not present

## 2015-10-05 DIAGNOSIS — H35371 Puckering of macula, right eye: Secondary | ICD-10-CM | POA: Diagnosis not present

## 2015-10-05 DIAGNOSIS — H43813 Vitreous degeneration, bilateral: Secondary | ICD-10-CM | POA: Diagnosis not present

## 2015-10-05 DIAGNOSIS — Z961 Presence of intraocular lens: Secondary | ICD-10-CM | POA: Insufficient documentation

## 2015-10-05 DIAGNOSIS — E1036 Type 1 diabetes mellitus with diabetic cataract: Secondary | ICD-10-CM | POA: Diagnosis not present

## 2015-10-05 DIAGNOSIS — Z87891 Personal history of nicotine dependence: Secondary | ICD-10-CM | POA: Diagnosis not present

## 2015-10-05 DIAGNOSIS — E103211 Type 1 diabetes mellitus with mild nonproliferative diabetic retinopathy with macular edema, right eye: Secondary | ICD-10-CM | POA: Diagnosis not present

## 2015-10-05 DIAGNOSIS — Z794 Long term (current) use of insulin: Secondary | ICD-10-CM | POA: Diagnosis not present

## 2015-10-05 DIAGNOSIS — E103292 Type 1 diabetes mellitus with mild nonproliferative diabetic retinopathy without macular edema, left eye: Secondary | ICD-10-CM | POA: Diagnosis not present

## 2015-10-06 DIAGNOSIS — C9000 Multiple myeloma not having achieved remission: Secondary | ICD-10-CM | POA: Diagnosis not present

## 2015-10-06 DIAGNOSIS — E7800 Pure hypercholesterolemia, unspecified: Secondary | ICD-10-CM | POA: Diagnosis not present

## 2015-10-06 DIAGNOSIS — Z6828 Body mass index (BMI) 28.0-28.9, adult: Secondary | ICD-10-CM | POA: Diagnosis not present

## 2015-10-06 DIAGNOSIS — I10 Essential (primary) hypertension: Secondary | ICD-10-CM | POA: Diagnosis not present

## 2015-10-06 DIAGNOSIS — E109 Type 1 diabetes mellitus without complications: Secondary | ICD-10-CM | POA: Diagnosis not present

## 2015-10-11 ENCOUNTER — Other Ambulatory Visit: Payer: Self-pay | Admitting: *Deleted

## 2015-10-11 DIAGNOSIS — S91332A Puncture wound without foreign body, left foot, initial encounter: Secondary | ICD-10-CM

## 2015-10-11 DIAGNOSIS — C9000 Multiple myeloma not having achieved remission: Secondary | ICD-10-CM

## 2015-10-11 MED ORDER — POMALIDOMIDE 4 MG PO CAPS: 4.0000 mg | ORAL_CAPSULE | Freq: Every day | ORAL | Status: DC

## 2015-10-12 DIAGNOSIS — L821 Other seborrheic keratosis: Secondary | ICD-10-CM | POA: Diagnosis not present

## 2015-10-12 DIAGNOSIS — L570 Actinic keratosis: Secondary | ICD-10-CM | POA: Diagnosis not present

## 2015-10-12 DIAGNOSIS — D1801 Hemangioma of skin and subcutaneous tissue: Secondary | ICD-10-CM | POA: Diagnosis not present

## 2015-10-12 DIAGNOSIS — D485 Neoplasm of uncertain behavior of skin: Secondary | ICD-10-CM | POA: Diagnosis not present

## 2015-10-12 DIAGNOSIS — Z08 Encounter for follow-up examination after completed treatment for malignant neoplasm: Secondary | ICD-10-CM | POA: Diagnosis not present

## 2015-10-12 DIAGNOSIS — L308 Other specified dermatitis: Secondary | ICD-10-CM | POA: Diagnosis not present

## 2015-10-12 DIAGNOSIS — L281 Prurigo nodularis: Secondary | ICD-10-CM | POA: Diagnosis not present

## 2015-10-12 DIAGNOSIS — Z85828 Personal history of other malignant neoplasm of skin: Secondary | ICD-10-CM | POA: Diagnosis not present

## 2015-10-16 ENCOUNTER — Encounter: Payer: Self-pay | Admitting: Hematology & Oncology

## 2015-10-16 ENCOUNTER — Ambulatory Visit (HOSPITAL_BASED_OUTPATIENT_CLINIC_OR_DEPARTMENT_OTHER): Payer: Medicare Other | Admitting: Hematology & Oncology

## 2015-10-16 ENCOUNTER — Other Ambulatory Visit (HOSPITAL_BASED_OUTPATIENT_CLINIC_OR_DEPARTMENT_OTHER): Payer: Medicare Other

## 2015-10-16 VITALS — BP 122/57 | HR 62 | Temp 97.9°F | Resp 16 | Ht 71.0 in | Wt 208.0 lb

## 2015-10-16 DIAGNOSIS — C9001 Multiple myeloma in remission: Secondary | ICD-10-CM

## 2015-10-16 DIAGNOSIS — C9002 Multiple myeloma in relapse: Secondary | ICD-10-CM

## 2015-10-16 LAB — CBC WITH DIFFERENTIAL (CANCER CENTER ONLY)
BASO#: 0.2 10*3/uL (ref 0.0–0.2)
BASO%: 3.8 % — AB (ref 0.0–2.0)
EOS%: 2 % (ref 0.0–7.0)
Eosinophils Absolute: 0.1 10*3/uL (ref 0.0–0.5)
HCT: 45.4 % (ref 38.7–49.9)
HGB: 15.9 g/dL (ref 13.0–17.1)
LYMPH#: 1.4 10*3/uL (ref 0.9–3.3)
LYMPH%: 35 % (ref 14.0–48.0)
MCH: 34.6 pg — ABNORMAL HIGH (ref 28.0–33.4)
MCHC: 35 g/dL (ref 32.0–35.9)
MCV: 99 fL — ABNORMAL HIGH (ref 82–98)
MONO#: 0.6 10*3/uL (ref 0.1–0.9)
MONO%: 14.5 % — AB (ref 0.0–13.0)
NEUT%: 44.7 % (ref 40.0–80.0)
NEUTROS ABS: 1.8 10*3/uL (ref 1.5–6.5)
Platelets: 147 10*3/uL (ref 145–400)
RBC: 4.6 10*6/uL (ref 4.20–5.70)
RDW: 13.3 % (ref 11.1–15.7)
WBC: 4 10*3/uL (ref 4.0–10.0)

## 2015-10-16 LAB — COMPREHENSIVE METABOLIC PANEL
ALBUMIN: 3.4 g/dL — AB (ref 3.5–5.0)
ALT: 19 U/L (ref 0–55)
AST: 14 U/L (ref 5–34)
Alkaline Phosphatase: 38 U/L — ABNORMAL LOW (ref 40–150)
Anion Gap: 6 mEq/L (ref 3–11)
BUN: 14.5 mg/dL (ref 7.0–26.0)
CALCIUM: 8.5 mg/dL (ref 8.4–10.4)
CHLORIDE: 108 meq/L (ref 98–109)
CO2: 27 mEq/L (ref 22–29)
CREATININE: 1 mg/dL (ref 0.7–1.3)
EGFR: 74 mL/min/{1.73_m2} — ABNORMAL LOW (ref >90)
Glucose: 166 mg/dl — ABNORMAL HIGH (ref 70–140)
Potassium: 4 mEq/L (ref 3.5–5.1)
Sodium: 141 mEq/L (ref 136–145)
Total Bilirubin: 0.96 mg/dL (ref 0.20–1.20)
Total Protein: 5.9 g/dL — ABNORMAL LOW (ref 6.4–8.3)

## 2015-10-17 DIAGNOSIS — R5383 Other fatigue: Secondary | ICD-10-CM | POA: Diagnosis not present

## 2015-10-17 DIAGNOSIS — C9002 Multiple myeloma in relapse: Secondary | ICD-10-CM | POA: Diagnosis not present

## 2015-10-17 DIAGNOSIS — Z5111 Encounter for antineoplastic chemotherapy: Secondary | ICD-10-CM | POA: Diagnosis not present

## 2015-10-17 LAB — IGG, IGA, IGM
IGA/IMMUNOGLOBULIN A, SERUM: 122 mg/dL (ref 61–437)
IGG (IMMUNOGLOBIN G), SERUM: 659 mg/dL — AB (ref 700–1600)
IGM (IMMUNOGLOBIN M), SRM: 19 mg/dL (ref 15–143)

## 2015-10-17 LAB — KAPPA/LAMBDA LIGHT CHAINS
Ig Kappa Free Light Chain: 157.21 mg/L — ABNORMAL HIGH (ref 3.30–19.40)
Ig Lambda Free Light Chain: 8.28 mg/L (ref 5.71–26.30)
Kappa/Lambda FluidC Ratio: 18.99 — ABNORMAL HIGH (ref 0.26–1.65)

## 2015-10-18 LAB — PROTEIN ELECTROPHORESIS, SERUM, WITH REFLEX
A/G Ratio: 1.7 (ref 0.7–1.7)
Albumin: 3.4 g/dL (ref 2.9–4.4)
Alpha 1: 0.2 g/dL (ref 0.0–0.4)
Alpha 2: 0.5 g/dL (ref 0.4–1.0)
Beta: 0.7 g/dL (ref 0.7–1.3)
Gamma Globulin: 0.6 g/dL (ref 0.4–1.8)
Globulin, Total: 2 g/dL — ABNORMAL LOW (ref 2.2–3.9)
Total Protein: 5.4 g/dL — ABNORMAL LOW (ref 6.0–8.5)

## 2015-10-24 DIAGNOSIS — Z5111 Encounter for antineoplastic chemotherapy: Secondary | ICD-10-CM | POA: Diagnosis not present

## 2015-10-24 DIAGNOSIS — C9002 Multiple myeloma in relapse: Secondary | ICD-10-CM | POA: Diagnosis not present

## 2015-10-24 NOTE — Progress Notes (Signed)
Hematology and Oncology Follow Up Visit  Rickey [name] [name] [id number] 1940/11/04 75 y.o. 10/24/2015   Principle Diagnosis:   Kappa light chain myeloma-clinical relapse  Current Therapy:   Patient is on Velcade/ Pomalyst      Interim History:  [name] is back for followup. We last saw him back in November. Since then, he's been getting treatments out in [place] as he has wife have a beach home. [place]. They go to the [facility] [place]. They really like the doctor up there.  He's been doing pretty well for they haven't told. He looks good. His blood sugars have been monitored and managed with an insulin pump. He's had no fever. He's had no infections.  There's been no change in bowel or bladder habits.  He's had no rashes.  Denies any kind of neuropathy or tingling in the hands or feet.  Continues on aspirin. He also is on acyclovir.  Last time we checked his Kappa Lightchain was back in November and his level was 27.6 mg/dL  Overall, his performance status is ECOG 1  Medications:  Current outpatient prescriptions:  .  acyclovir (ZOVIRAX) 400 MG tablet, Take 1 tablet (400 mg total) by mouth 2 (two) times daily., Disp: 60 tablet, Rfl: 12 .  amLODipine (NORVASC) 2.5 MG tablet, Take 2.5 mg by mouth daily. , Disp: , Rfl:  .  aspirin 81 MG tablet, Take 81 mg by mouth daily.  , Disp: , Rfl:  .  atorvastatin (LIPITOR) 10 MG tablet, Take 10 mg by mouth daily.  , Disp: , Rfl:  .  calcium-vitamin D (OSCAL) 250-125 MG-UNIT per tablet, Take 1 tablet by mouth daily. , Disp: , Rfl:  .  Cholecalciferol (VITAMIN D) 2000 UNITS CAPS, Take by mouth every morning., Disp: , Rfl:  .  cycloSPORINE (RESTASIS) 0.05 % ophthalmic emulsion, 1 drop 2 (two) times daily.  , Disp: , Rfl:  .  fish oil-omega-3 fatty acids 1000 MG capsule, Take 2 g by mouth 2 (two) times daily. , Disp: , Rfl:  .  insulin lispro (HUMALOG) 100 UNIT/ML injection, Inject into the skin. Insulin  pump, Disp: , Rfl:  .  montelukast (SINGULAIR) 10 MG tablet, Take 10 mg by mouth at bedtime.  , Disp: , Rfl:  .  pomalidomide (POMALYST) 4 MG capsule, Take 1 capsule (4 mg total) by mouth daily. Take with water on days 1-21. Repeat every 28 days. [id number], Disp: 21 capsule, Rfl: 0 .  telmisartan-hydrochlorothiazide (MICARDIS HCT) 80-12.5 MG per tablet, Take 1 tablet by mouth daily. , Disp: , Rfl:  .  triamcinolone cream (KENALOG) 0.1 %, , Disp: , Rfl: 0  Allergies: No Known Allergies  Past Medical History, Surgical history, Social history, and Family History were reviewed and updated.  Review of Systems: As above  Physical Exam:  height is 5\' 11"  (1.803 m) and weight is 208 lb (94.348 kg). His oral temperature is 97.9 F (36.6 C). His blood pressure is 122/57 and his pulse is 62. His respiration is 16.   Head and neck exam shows no ocular or oral lesions. He has no palpable cervical or supraclavicular lymph nodes. Thyroid is nonpalpable.Lungs are clear. Cardiac exam regular rate and rhythm with a normal S1 and S2. There are no murmurs, rubs or bruits. . Abdomen is soft. He has good bowel sounds. There is no palpable abdominal mass. There is no palpable liver or spleen. Back exam shows no  tenderness over the spine, ribs or hips. Extremities shows no clubbing cyanosis or edema. Skin exam no rashes ecchymosis or petechia.  Neurological exam is nonfocal.  Lab Results  Component Value Date   WBC 4.0 10/16/2015   HGB 15.9 10/16/2015   HCT 45.4 10/16/2015   MCV 99* 10/16/2015   PLT 147 10/16/2015     Chemistry      Component Value Date/Time   NA 141 10/16/2015 1338   NA 136 02/06/2015 1207   NA 138 04/16/2013 1133   K 4.0 10/16/2015 1338   K 4.0 02/06/2015 1207   K 4.2 04/16/2013 1133   CL 99 02/06/2015 1207   CL 103 04/16/2013 1133   CO2 27 10/16/2015 1338   CO2 28 02/06/2015 1207   CO2 27 04/16/2013 1133   BUN 14.5 10/16/2015 1338   BUN 17 02/06/2015 1207   BUN 18 04/16/2013  1133   CREATININE 1.0 10/16/2015 1338   CREATININE 1.3* 02/06/2015 1207   CREATININE 1.12 04/16/2013 1133      Component Value Date/Time   CALCIUM 8.5 10/16/2015 1338   CALCIUM 8.5 02/06/2015 1207   CALCIUM 8.9 04/16/2013 1133   ALKPHOS 38* 10/16/2015 1338   ALKPHOS 44 02/06/2015 1207   ALKPHOS 45 04/16/2013 1133   AST 14 10/16/2015 1338   AST 25 02/06/2015 1207   AST 22 04/16/2013 1133   ALT 19 10/16/2015 1338   ALT 22 02/06/2015 1207   ALT 26 04/16/2013 1133   BILITOT 0.96 10/16/2015 1338   BILITOT 1.10 02/06/2015 1207   BILITOT 1.1 04/16/2013 1133      Kappa Light chain level is 15.7 mg/dL   Impression and Plan: Rickey [name] [name] is a 75 year old gentleman with kappa light chain myeloma. He got into a remission currently quickly. We then got him to transplant.   It has been about 7 years since transplant.  We have him on salvage therapy. I am pretty confident that the Velcade along with the Pomalidomide will work.  His light chains continue to improve.  I will plan to get him back in 2 months. Again, [place] Dr. Pearline Cables monthly.  I spent about 35 minutes with he and his wife.    Rickey [name] Napoleon, MD 5/30/20177:28 AM

## 2015-10-31 DIAGNOSIS — Z5112 Encounter for antineoplastic immunotherapy: Secondary | ICD-10-CM | POA: Diagnosis not present

## 2015-10-31 DIAGNOSIS — C9002 Multiple myeloma in relapse: Secondary | ICD-10-CM | POA: Diagnosis not present

## 2015-11-03 ENCOUNTER — Other Ambulatory Visit: Payer: Self-pay | Admitting: *Deleted

## 2015-11-03 DIAGNOSIS — C9000 Multiple myeloma not having achieved remission: Secondary | ICD-10-CM

## 2015-11-03 DIAGNOSIS — S91332A Puncture wound without foreign body, left foot, initial encounter: Secondary | ICD-10-CM

## 2015-11-03 MED ORDER — POMALIDOMIDE 4 MG PO CAPS: 4.0000 mg | ORAL_CAPSULE | Freq: Every day | ORAL | Status: DC

## 2015-11-14 DIAGNOSIS — Z79899 Other long term (current) drug therapy: Secondary | ICD-10-CM | POA: Diagnosis not present

## 2015-11-14 DIAGNOSIS — C9002 Multiple myeloma in relapse: Secondary | ICD-10-CM | POA: Diagnosis not present

## 2015-12-04 ENCOUNTER — Other Ambulatory Visit: Payer: Self-pay | Admitting: *Deleted

## 2015-12-04 DIAGNOSIS — S91332A Puncture wound without foreign body, left foot, initial encounter: Secondary | ICD-10-CM

## 2015-12-04 DIAGNOSIS — C9000 Multiple myeloma not having achieved remission: Secondary | ICD-10-CM

## 2015-12-04 MED ORDER — POMALIDOMIDE 4 MG PO CAPS: 4.0000 mg | ORAL_CAPSULE | Freq: Every day | ORAL | Status: DC

## 2015-12-18 ENCOUNTER — Other Ambulatory Visit: Payer: Medicare Other

## 2015-12-18 ENCOUNTER — Ambulatory Visit: Payer: Medicare Other | Admitting: Hematology & Oncology

## 2015-12-18 DIAGNOSIS — Z9841 Cataract extraction status, right eye: Secondary | ICD-10-CM | POA: Diagnosis not present

## 2015-12-18 DIAGNOSIS — Z9889 Other specified postprocedural states: Secondary | ICD-10-CM | POA: Diagnosis not present

## 2015-12-18 DIAGNOSIS — E103292 Type 1 diabetes mellitus with mild nonproliferative diabetic retinopathy without macular edema, left eye: Secondary | ICD-10-CM | POA: Diagnosis not present

## 2015-12-18 DIAGNOSIS — Z87891 Personal history of nicotine dependence: Secondary | ICD-10-CM | POA: Diagnosis not present

## 2015-12-18 DIAGNOSIS — H35341 Macular cyst, hole, or pseudohole, right eye: Secondary | ICD-10-CM | POA: Diagnosis not present

## 2015-12-18 DIAGNOSIS — H35371 Puckering of macula, right eye: Secondary | ICD-10-CM | POA: Diagnosis not present

## 2015-12-18 DIAGNOSIS — Z7982 Long term (current) use of aspirin: Secondary | ICD-10-CM | POA: Diagnosis not present

## 2015-12-18 DIAGNOSIS — E103211 Type 1 diabetes mellitus with mild nonproliferative diabetic retinopathy with macular edema, right eye: Secondary | ICD-10-CM | POA: Diagnosis not present

## 2015-12-18 DIAGNOSIS — Z794 Long term (current) use of insulin: Secondary | ICD-10-CM | POA: Diagnosis not present

## 2015-12-18 DIAGNOSIS — Z9842 Cataract extraction status, left eye: Secondary | ICD-10-CM | POA: Diagnosis not present

## 2015-12-18 DIAGNOSIS — H35041 Retinal micro-aneurysms, unspecified, right eye: Secondary | ICD-10-CM | POA: Diagnosis not present

## 2015-12-18 DIAGNOSIS — Z961 Presence of intraocular lens: Secondary | ICD-10-CM | POA: Diagnosis not present

## 2015-12-18 DIAGNOSIS — H43813 Vitreous degeneration, bilateral: Secondary | ICD-10-CM | POA: Diagnosis not present

## 2016-01-01 ENCOUNTER — Other Ambulatory Visit (HOSPITAL_BASED_OUTPATIENT_CLINIC_OR_DEPARTMENT_OTHER): Payer: Medicare Other

## 2016-01-01 ENCOUNTER — Ambulatory Visit (HOSPITAL_BASED_OUTPATIENT_CLINIC_OR_DEPARTMENT_OTHER): Payer: Medicare Other | Admitting: Hematology & Oncology

## 2016-01-01 ENCOUNTER — Encounter: Payer: Self-pay | Admitting: Hematology & Oncology

## 2016-01-01 VITALS — BP 134/58 | HR 56 | Temp 98.4°F | Resp 16 | Ht 71.0 in | Wt 202.0 lb

## 2016-01-01 DIAGNOSIS — C9002 Multiple myeloma in relapse: Secondary | ICD-10-CM

## 2016-01-01 DIAGNOSIS — C9000 Multiple myeloma not having achieved remission: Secondary | ICD-10-CM

## 2016-01-01 LAB — CBC WITH DIFFERENTIAL (CANCER CENTER ONLY)
BASO#: 0.1 10*3/uL (ref 0.0–0.2)
BASO%: 1.9 % (ref 0.0–2.0)
EOS ABS: 0.3 10*3/uL (ref 0.0–0.5)
EOS%: 8.4 % — ABNORMAL HIGH (ref 0.0–7.0)
HEMATOCRIT: 49 % (ref 38.7–49.9)
HEMOGLOBIN: 16.7 g/dL (ref 13.0–17.1)
LYMPH#: 1.2 10*3/uL (ref 0.9–3.3)
LYMPH%: 32.9 % (ref 14.0–48.0)
MCH: 34.9 pg — AB (ref 28.0–33.4)
MCHC: 34.1 g/dL (ref 32.0–35.9)
MCV: 103 fL — AB (ref 82–98)
MONO#: 0.8 10*3/uL (ref 0.1–0.9)
MONO%: 21.8 % — ABNORMAL HIGH (ref 0.0–13.0)
NEUT%: 35 % — ABNORMAL LOW (ref 40.0–80.0)
NEUTROS ABS: 1.3 10*3/uL — AB (ref 1.5–6.5)
Platelets: 139 10*3/uL — ABNORMAL LOW (ref 145–400)
RBC: 4.78 10*6/uL (ref 4.20–5.70)
RDW: 14.1 % (ref 11.1–15.7)
WBC: 3.7 10*3/uL — AB (ref 4.0–10.0)

## 2016-01-01 LAB — CMP (CANCER CENTER ONLY)
ALBUMIN: 3.4 g/dL (ref 3.3–5.5)
ALT(SGPT): 29 U/L (ref 10–47)
AST: 21 U/L (ref 11–38)
Alkaline Phosphatase: 44 U/L (ref 26–84)
BUN, Bld: 16 mg/dL (ref 7–22)
CALCIUM: 8.6 mg/dL (ref 8.0–10.3)
CHLORIDE: 104 meq/L (ref 98–108)
CO2: 29 mEq/L (ref 18–33)
CREATININE: 1.2 mg/dL (ref 0.6–1.2)
Glucose, Bld: 106 mg/dL (ref 73–118)
POTASSIUM: 4.8 meq/L — AB (ref 3.3–4.7)
Sodium: 136 mEq/L (ref 128–145)
TOTAL PROTEIN: 6.4 g/dL (ref 6.4–8.1)
Total Bilirubin: 1.2 mg/dl (ref 0.20–1.60)

## 2016-01-01 NOTE — Progress Notes (Signed)
Hematology and Oncology Follow Up Visit  Rickey [name] [id number] [birth year] 75 y.o. 01/01/2016   Principle Diagnosis:   Kappa light chain myeloma-clinical relapse  Current Therapy:   Patient is on Velcade/ Pomalyst      Interim History:  Rickey [name] is back for followup. He looks quite good. He has had no problems with treatment so far. He has tolerated treatment quite well.  There's not been any nausea or vomiting. He's had no diarrhea. He's had no cough.  He has not had any issues with the Pomalidomide.  He says that his oncologist up in [place] told that he probably can stop the Velcade. I think this probably would be okay. We will have to see what his light chain studies are.  He has had no issues with rashes. He has had no bleeding. He has had no leg swelling.  His last Kappa Lightchain was 15.7 mg/dL.  Overall, his performance status is ECOG 1  Medications:  Current Outpatient Prescriptions:  .  acyclovir (ZOVIRAX) 400 MG tablet, Take 1 tablet (400 mg total) by mouth 2 (two) times daily., Disp: 60 tablet, Rfl: 12 .  amLODipine (NORVASC) 2.5 MG tablet, Take 2.5 mg by mouth daily. , Disp: , Rfl:  .  aspirin 81 MG tablet, Take 81 mg by mouth daily.  , Disp: , Rfl:  .  atorvastatin (LIPITOR) 10 MG tablet, Take 10 mg by mouth daily.  , Disp: , Rfl:  .  calcium-vitamin D (OSCAL) 250-125 MG-UNIT per tablet, Take 1 tablet by mouth daily. , Disp: , Rfl:  .  Cholecalciferol (VITAMIN D) 2000 UNITS CAPS, Take by mouth every morning., Disp: , Rfl:  .  cycloSPORINE (RESTASIS) 0.05 % ophthalmic emulsion, 1 drop 2 (two) times daily.  , Disp: , Rfl:  .  fish oil-omega-3 fatty acids 1000 MG capsule, Take 2 g by mouth 2 (two) times daily. , Disp: , Rfl:  .  insulin lispro (HUMALOG) 100 UNIT/ML injection, Inject into the skin. Insulin pump, Disp: , Rfl:  .  montelukast (SINGULAIR) 10 MG tablet, Take 10 mg by mouth at bedtime.  , Disp: , Rfl:  .  pomalidomide (POMALYST) 4 MG capsule, Take 1  capsule (4 mg total) by mouth daily. Take with water on days 1-21. Repeat every 28 days. Auth# [id number], Disp: 21 capsule, Rfl: 0 .  telmisartan-hydrochlorothiazide (MICARDIS HCT) 80-12.5 MG per tablet, Take 1 tablet by mouth daily. , Disp: , Rfl:   Allergies: No Known Allergies  Past Medical History, Surgical history, Social history, and Family History were reviewed and updated.  Review of Systems: As above  Physical Exam:  height is 5\' 11"  (1.803 m) and weight is 202 lb (91.6 kg). His oral temperature is 98.4 F (36.9 C). His blood pressure is 134/58 (abnormal) and his pulse is 56 (abnormal). His respiration is 16.   Head and neck exam shows no ocular or oral lesions. He has no palpable cervical or supraclavicular lymph nodes. Thyroid is nonpalpable.Lungs are clear. Cardiac exam regular rate and rhythm with a normal S1 and S2. There are no murmurs, rubs or bruits. . Abdomen is soft. He has good bowel sounds. There is no palpable abdominal mass. There is no palpable liver or spleen. Back exam shows no tenderness over the spine, ribs or hips. Extremities shows no clubbing cyanosis or edema. Skin exam no rashes ecchymosis or petechia.  Neurological exam is nonfocal.  Lab Results  Component Value Date   WBC 3.7 (L)  01/01/2016   HGB 16.7 01/01/2016   HCT 49.0 01/01/2016   MCV 103 (H) 01/01/2016   PLT 139 (L) 01/01/2016     Chemistry      Component Value Date/Time   NA 136 01/01/2016 1050   NA 141 10/16/2015 1338   K 4.8 (H) 01/01/2016 1050   K 4.0 10/16/2015 1338   CL 104 01/01/2016 1050   CO2 29 01/01/2016 1050   CO2 27 10/16/2015 1338   BUN 16 01/01/2016 1050   BUN 14.5 10/16/2015 1338   CREATININE 1.2 01/01/2016 1050   CREATININE 1.0 10/16/2015 1338      Component Value Date/Time   CALCIUM 8.6 01/01/2016 1050   CALCIUM 8.5 10/16/2015 1338   ALKPHOS 44 01/01/2016 1050   ALKPHOS 38 (L) 10/16/2015 1338   AST 21 01/01/2016 1050   AST 14 10/16/2015 1338   ALT 29 01/01/2016  1050   ALT 19 10/16/2015 1338   BILITOT 1.20 01/01/2016 1050   BILITOT 0.96 10/16/2015 1338      Kappa Light chain level is 15.7 mg/dL   Impression and Plan: Rickey [name] is a 75 year old gentleman with kappa light chain myeloma. He got into a remission currently quickly. We then got him to transplant.   It has been about 71/2 years since transplant.  We have him on salvage therapy. I am pretty confident that the Velcade along with the Pomalidomide Is working.  I think we might be able to stop the Velcade. Hopefully, the Pomalidomide alone will do the job for [place]. With his diabetes, I just don't want him getting any count of neuropathy which could be irreversible.  Unfortunately, Rickey [name] told me that his oncologist up in [place] is retiring. I feel bad about this. It sounds like his oncologist just is tired of the paperwork and administrative garbage that he has to put up with.  I spent about 35 minutes with he and his wife.    [name], MD 8/7/20175:39 PM

## 2016-01-02 LAB — IGG, IGA, IGM
IGA/IMMUNOGLOBULIN A, SERUM: 167 mg/dL (ref 61–437)
IGM (IMMUNOGLOBIN M), SRM: 29 mg/dL (ref 15–143)
IgG, Qn, Serum: 740 mg/dL (ref 700–1600)

## 2016-01-02 LAB — KAPPA/LAMBDA LIGHT CHAINS
Ig Kappa Free Light Chain: 128.9 mg/L — ABNORMAL HIGH (ref 3.3–19.4)
Ig Lambda Free Light Chain: 14.4 mg/L (ref 5.7–26.3)
KAPPA/LAMBDA FLC RATIO: 8.95 — AB (ref 0.26–1.65)

## 2016-01-03 ENCOUNTER — Telehealth: Payer: Self-pay | Admitting: Nurse Practitioner

## 2016-01-03 LAB — PROTEIN ELECTROPHORESIS, SERUM, WITH REFLEX
A/G Ratio: 1.5 (ref 0.7–1.7)
ALPHA 1: 0.2 g/dL (ref 0.0–0.4)
ALPHA 2: 0.6 g/dL (ref 0.4–1.0)
Albumin: 3.6 g/dL (ref 2.9–4.4)
Beta: 0.8 g/dL (ref 0.7–1.3)
GLOBULIN, TOTAL: 2.4 g/dL (ref 2.2–3.9)
Gamma Globulin: 0.7 g/dL (ref 0.4–1.8)
TOTAL PROTEIN: 6 g/dL (ref 6.0–8.5)

## 2016-01-03 NOTE — Telephone Encounter (Addendum)
Pt verbalized understanding and appreciation. ----- Message from [name], MD sent at 01/03/2016  7:14 AM EDT ----- Call - the kappa light chain is now down to 13mg /dL!!!  Great job!!  Rickey [name]

## 2016-01-05 ENCOUNTER — Other Ambulatory Visit: Payer: Self-pay | Admitting: Nurse Practitioner

## 2016-01-05 DIAGNOSIS — C9000 Multiple myeloma not having achieved remission: Secondary | ICD-10-CM

## 2016-01-05 DIAGNOSIS — S91332A Puncture wound without foreign body, left foot, initial encounter: Secondary | ICD-10-CM

## 2016-01-05 MED ORDER — POMALIDOMIDE 4 MG PO CAPS: 4.0000 mg | ORAL_CAPSULE | Freq: Every day | ORAL | 0 refills | Status: DC

## 2016-01-18 DIAGNOSIS — E109 Type 1 diabetes mellitus without complications: Secondary | ICD-10-CM | POA: Diagnosis not present

## 2016-01-18 DIAGNOSIS — Z6828 Body mass index (BMI) 28.0-28.9, adult: Secondary | ICD-10-CM | POA: Diagnosis not present

## 2016-01-18 DIAGNOSIS — Z4681 Encounter for fitting and adjustment of insulin pump: Secondary | ICD-10-CM | POA: Diagnosis not present

## 2016-01-18 DIAGNOSIS — I10 Essential (primary) hypertension: Secondary | ICD-10-CM | POA: Diagnosis not present

## 2016-01-20 ENCOUNTER — Other Ambulatory Visit: Payer: Self-pay | Admitting: Hematology & Oncology

## 2016-02-02 ENCOUNTER — Other Ambulatory Visit: Payer: Self-pay

## 2016-02-02 DIAGNOSIS — S91332A Puncture wound without foreign body, left foot, initial encounter: Secondary | ICD-10-CM

## 2016-02-02 DIAGNOSIS — C9000 Multiple myeloma not having achieved remission: Secondary | ICD-10-CM

## 2016-02-02 MED ORDER — POMALIDOMIDE 4 MG PO CAPS: 4.0000 mg | ORAL_CAPSULE | Freq: Every day | ORAL | 0 refills | Status: DC

## 2016-02-21 ENCOUNTER — Other Ambulatory Visit: Payer: Self-pay | Admitting: Hematology & Oncology

## 2016-02-26 ENCOUNTER — Other Ambulatory Visit: Payer: Self-pay | Admitting: *Deleted

## 2016-02-26 DIAGNOSIS — C9000 Multiple myeloma not having achieved remission: Secondary | ICD-10-CM

## 2016-02-26 DIAGNOSIS — S91332A Puncture wound without foreign body, left foot, initial encounter: Secondary | ICD-10-CM

## 2016-02-26 MED ORDER — POMALIDOMIDE 4 MG PO CAPS: 4.0000 mg | ORAL_CAPSULE | Freq: Every day | ORAL | 0 refills | Status: DC

## 2016-03-05 ENCOUNTER — Encounter: Payer: Self-pay | Admitting: Hematology & Oncology

## 2016-03-05 ENCOUNTER — Other Ambulatory Visit (HOSPITAL_BASED_OUTPATIENT_CLINIC_OR_DEPARTMENT_OTHER): Payer: Medicare Other

## 2016-03-05 ENCOUNTER — Ambulatory Visit (HOSPITAL_BASED_OUTPATIENT_CLINIC_OR_DEPARTMENT_OTHER): Payer: Medicare Other | Admitting: Hematology & Oncology

## 2016-03-05 ENCOUNTER — Ambulatory Visit (HOSPITAL_BASED_OUTPATIENT_CLINIC_OR_DEPARTMENT_OTHER): Payer: Medicare Other

## 2016-03-05 VITALS — BP 143/56 | HR 63 | Temp 97.5°F | Resp 16 | Ht 71.0 in | Wt 201.0 lb

## 2016-03-05 DIAGNOSIS — C9000 Multiple myeloma not having achieved remission: Secondary | ICD-10-CM | POA: Diagnosis not present

## 2016-03-05 DIAGNOSIS — Z23 Encounter for immunization: Secondary | ICD-10-CM | POA: Diagnosis present

## 2016-03-05 DIAGNOSIS — Z9484 Stem cells transplant status: Secondary | ICD-10-CM | POA: Diagnosis not present

## 2016-03-05 DIAGNOSIS — C9001 Multiple myeloma in remission: Secondary | ICD-10-CM | POA: Diagnosis not present

## 2016-03-05 LAB — COMPREHENSIVE METABOLIC PANEL
ALT: 25 U/L (ref 0–55)
AST: 20 U/L (ref 5–34)
Albumin: 3.6 g/dL (ref 3.5–5.0)
Alkaline Phosphatase: 54 U/L (ref 40–150)
Anion Gap: 10 mEq/L (ref 3–11)
BUN: 17.4 mg/dL (ref 7.0–26.0)
CALCIUM: 9.3 mg/dL (ref 8.4–10.4)
CHLORIDE: 104 meq/L (ref 98–109)
CO2: 27 meq/L (ref 22–29)
CREATININE: 0.9 mg/dL (ref 0.7–1.3)
EGFR: 81 mL/min/{1.73_m2} — ABNORMAL LOW (ref >90)
Glucose: 191 mg/dl — ABNORMAL HIGH (ref 70–140)
Potassium: 4.7 mEq/L (ref 3.5–5.1)
Sodium: 141 mEq/L (ref 136–145)
Total Bilirubin: 1.21 mg/dL — ABNORMAL HIGH (ref 0.20–1.20)
Total Protein: 6.6 g/dL (ref 6.4–8.3)

## 2016-03-05 LAB — CBC WITH DIFFERENTIAL (CANCER CENTER ONLY)
BASO#: 0.1 10*3/uL (ref 0.0–0.2)
BASO%: 1.9 % (ref 0.0–2.0)
EOS%: 1 % (ref 0.0–7.0)
Eosinophils Absolute: 0 10*3/uL (ref 0.0–0.5)
HEMATOCRIT: 47.1 % (ref 38.7–49.9)
HGB: 16.4 g/dL (ref 13.0–17.1)
LYMPH#: 1.2 10*3/uL (ref 0.9–3.3)
LYMPH%: 28.2 % (ref 14.0–48.0)
MCH: 35.1 pg — ABNORMAL HIGH (ref 28.0–33.4)
MCHC: 34.8 g/dL (ref 32.0–35.9)
MCV: 101 fL — ABNORMAL HIGH (ref 82–98)
MONO#: 0.7 10*3/uL (ref 0.1–0.9)
MONO%: 16.6 % — ABNORMAL HIGH (ref 0.0–13.0)
NEUT%: 52.3 % (ref 40.0–80.0)
NEUTROS ABS: 2.2 10*3/uL (ref 1.5–6.5)
Platelets: 162 10*3/uL (ref 145–400)
RBC: 4.67 10*6/uL (ref 4.20–5.70)
RDW: 13.5 % (ref 11.1–15.7)
WBC: 4.2 10*3/uL (ref 4.0–10.0)

## 2016-03-05 MED ORDER — INFLUENZA VAC SPLIT QUAD 0.5 ML IM SUSY
0.5000 mL | PREFILLED_SYRINGE | Freq: Once | INTRAMUSCULAR | Status: AC
  Administered 2016-03-05: 0.5 mL via INTRAMUSCULAR
  Filled 2016-03-05: qty 0.5

## 2016-03-05 NOTE — Patient Instructions (Signed)

## 2016-03-06 LAB — KAPPA/LAMBDA LIGHT CHAINS
Ig Kappa Free Light Chain: 120.4 mg/L — ABNORMAL HIGH (ref 3.3–19.4)
Ig Lambda Free Light Chain: 10.6 mg/L (ref 5.7–26.3)
Kappa/Lambda FluidC Ratio: 11.36 — ABNORMAL HIGH (ref 0.26–1.65)

## 2016-03-06 LAB — PROTEIN ELECTROPHORESIS, SERUM, WITH REFLEX
A/G Ratio: 1.7 (ref 0.7–1.7)
ALPHA 1: 0.2 g/dL (ref 0.0–0.4)
ALPHA 2: 0.6 g/dL (ref 0.4–1.0)
Albumin: 3.9 g/dL (ref 2.9–4.4)
Beta: 0.8 g/dL (ref 0.7–1.3)
GLOBULIN, TOTAL: 2.3 g/dL (ref 2.2–3.9)
Gamma Globulin: 0.7 g/dL (ref 0.4–1.8)
PDF SPE: 0
Total Protein: 6.2 g/dL (ref 6.0–8.5)

## 2016-03-07 ENCOUNTER — Telehealth: Payer: Self-pay | Admitting: *Deleted

## 2016-03-07 NOTE — Telephone Encounter (Addendum)
Patient aware of results  ----- Message from [name], MD sent at 03/06/2016  5:12 PM EDT ----- Call - the light chain is holding steady at 12!!!!  This is wonderful!!  Rickey [name]

## 2016-03-09 NOTE — Progress Notes (Signed)
Hematology and Oncology Follow Up Visit  COLEY SHEELEY [id number] Jul 25, 1940 75 y.o. 03/09/2016   Principle Diagnosis:   Kappa light chain myeloma-clinical relapse  Current Therapy:   Patient is on Pomalyst  (Velcade now on hold)     Interim History:  Mr.  Soliman is back for followup. He looks quite good. He has had no problems with treatment so far. He has tolerated treatment quite well.  There's not been any nausea or vomiting. He's had no diarrhea. He's had no cough.  He has not had any issues with the Pomalidomide.  He says that his oncologist up in [place] told that he probably can stop the Velcade. I think this probably would be okay. We will have to see what his light chain studies are.  He has had no issues with rashes. He has had no bleeding. He has had no leg swelling.  His last Kappa Lightchain was 13 mg/dL.  Overall, his performance status is ECOG 1  Medications:  Current Outpatient Prescriptions:  .  acyclovir (ZOVIRAX) 400 MG tablet, TAKE 1 TABLET(400 MG) BY MOUTH TWICE DAILY, Disp: 60 tablet, Rfl: 0 .  amLODipine (NORVASC) 2.5 MG tablet, Take 2.5 mg by mouth daily. , Disp: , Rfl:  .  aspirin 81 MG tablet, Take 81 mg by mouth daily.  , Disp: , Rfl:  .  atorvastatin (LIPITOR) 10 MG tablet, Take 10 mg by mouth daily.  , Disp: , Rfl:  .  calcium-vitamin D (OSCAL) 250-125 MG-UNIT per tablet, Take 1 tablet by mouth daily. , Disp: , Rfl:  .  Cholecalciferol (VITAMIN D) 2000 UNITS CAPS, Take by mouth every morning., Disp: , Rfl:  .  cycloSPORINE (RESTASIS) 0.05 % ophthalmic emulsion, 1 drop 2 (two) times daily.  , Disp: , Rfl:  .  fish oil-omega-3 fatty acids 1000 MG capsule, Take 2 g by mouth 2 (two) times daily. , Disp: , Rfl:  .  insulin lispro (HUMALOG) 100 UNIT/ML injection, Inject into the skin. Insulin pump, Disp: , Rfl:  .  montelukast (SINGULAIR) 10 MG tablet, Take 10 mg by mouth at bedtime.  , Disp: , Rfl:  .  pomalidomide (POMALYST) 4 MG capsule, Take 1  capsule (4 mg total) by mouth daily. Take with water on days 1-21. Repeat every 28 days. [id number], Disp: 21 capsule, Rfl: 0 .  telmisartan-hydrochlorothiazide (MICARDIS HCT) 80-12.5 MG per tablet, Take 1 tablet by mouth daily. , Disp: , Rfl:   Allergies: No Known Allergies  Past Medical History, Surgical history, Social history, and Family History were reviewed and updated.  Review of Systems: As above  Physical Exam:  height is 5\' 11"  (1.803 m) and weight is 201 lb (91.2 kg). His oral temperature is 97.5 F (36.4 C). His blood pressure is 143/56 (abnormal) and his pulse is 63. His respiration is 16.   Head and neck exam shows no ocular or oral lesions. He has no palpable cervical or supraclavicular lymph nodes. Thyroid is nonpalpable.Lungs are clear. Cardiac exam regular rate and rhythm with a normal S1 and S2. There are no murmurs, rubs or bruits. . Abdomen is soft. He has good bowel sounds. There is no palpable abdominal mass. There is no palpable liver or spleen. Back exam shows no tenderness over the spine, ribs or hips. Extremities shows no clubbing cyanosis or edema. Skin exam no rashes ecchymosis or petechia.  Neurological exam is nonfocal.  Lab Results  Component Value Date   WBC 4.2 03/05/2016   HGB  16.4 03/05/2016   HCT 47.1 03/05/2016   MCV 101 (H) 03/05/2016   PLT 162 03/05/2016     Chemistry      Component Value Date/Time   NA 141 03/05/2016 1043   K 4.7 03/05/2016 1043   CL 104 01/01/2016 1050   CO2 27 03/05/2016 1043   BUN 17.4 03/05/2016 1043   CREATININE 0.9 03/05/2016 1043      Component Value Date/Time   CALCIUM 9.3 03/05/2016 1043   ALKPHOS 54 03/05/2016 1043   AST 20 03/05/2016 1043   ALT 25 03/05/2016 1043   BILITOT 1.21 (H) 03/05/2016 1043      Kappa Light chain level is 15.7 mg/dL   Impression and Plan: Mr. Natividad is a 75 year old gentleman with kappa light chain myeloma. He got into a remission currently quickly. We then got him to  transplant.   It has been about 71/2 years since transplant.  He is off Velcade right now. He is to start the Pomalidomide. His Kappa Lightchain is holding steady at 12 mg/dL.  We will plan to get him back in another couple months.  I spent about 35 minutes with he and his wife.    [name], MD 10/14/201712:30 PM

## 2016-03-24 ENCOUNTER — Other Ambulatory Visit: Payer: Self-pay | Admitting: Hematology & Oncology

## 2016-03-25 ENCOUNTER — Other Ambulatory Visit: Payer: Self-pay | Admitting: *Deleted

## 2016-03-25 DIAGNOSIS — C9000 Multiple myeloma not having achieved remission: Secondary | ICD-10-CM

## 2016-03-25 DIAGNOSIS — S91332A Puncture wound without foreign body, left foot, initial encounter: Secondary | ICD-10-CM

## 2016-03-25 MED ORDER — POMALIDOMIDE 4 MG PO CAPS: 4.0000 mg | ORAL_CAPSULE | Freq: Every day | ORAL | 0 refills | Status: DC

## 2016-04-19 ENCOUNTER — Other Ambulatory Visit: Payer: Self-pay | Admitting: *Deleted

## 2016-04-19 DIAGNOSIS — S91332A Puncture wound without foreign body, left foot, initial encounter: Secondary | ICD-10-CM

## 2016-04-19 DIAGNOSIS — C9000 Multiple myeloma not having achieved remission: Secondary | ICD-10-CM

## 2016-04-19 MED ORDER — POMALIDOMIDE 4 MG PO CAPS: 4.0000 mg | ORAL_CAPSULE | Freq: Every day | ORAL | 0 refills | Status: DC

## 2016-04-25 DIAGNOSIS — L570 Actinic keratosis: Secondary | ICD-10-CM | POA: Diagnosis not present

## 2016-04-25 DIAGNOSIS — L821 Other seborrheic keratosis: Secondary | ICD-10-CM | POA: Diagnosis not present

## 2016-04-25 DIAGNOSIS — L538 Other specified erythematous conditions: Secondary | ICD-10-CM | POA: Diagnosis not present

## 2016-04-25 DIAGNOSIS — L298 Other pruritus: Secondary | ICD-10-CM | POA: Diagnosis not present

## 2016-04-25 DIAGNOSIS — L281 Prurigo nodularis: Secondary | ICD-10-CM | POA: Diagnosis not present

## 2016-04-25 DIAGNOSIS — D1801 Hemangioma of skin and subcutaneous tissue: Secondary | ICD-10-CM | POA: Diagnosis not present

## 2016-05-09 ENCOUNTER — Other Ambulatory Visit: Payer: Self-pay | Admitting: Hematology & Oncology

## 2016-05-09 DIAGNOSIS — N400 Enlarged prostate without lower urinary tract symptoms: Secondary | ICD-10-CM | POA: Diagnosis not present

## 2016-05-09 DIAGNOSIS — I10 Essential (primary) hypertension: Secondary | ICD-10-CM | POA: Diagnosis not present

## 2016-05-09 DIAGNOSIS — E109 Type 1 diabetes mellitus without complications: Secondary | ICD-10-CM | POA: Diagnosis not present

## 2016-05-09 DIAGNOSIS — I450 Right fascicular block: Secondary | ICD-10-CM | POA: Diagnosis not present

## 2016-05-09 DIAGNOSIS — E7800 Pure hypercholesterolemia, unspecified: Secondary | ICD-10-CM | POA: Diagnosis not present

## 2016-05-17 DIAGNOSIS — E784 Other hyperlipidemia: Secondary | ICD-10-CM | POA: Diagnosis not present

## 2016-05-17 DIAGNOSIS — Z6827 Body mass index (BMI) 27.0-27.9, adult: Secondary | ICD-10-CM | POA: Diagnosis not present

## 2016-05-17 DIAGNOSIS — I4510 Unspecified right bundle-branch block: Secondary | ICD-10-CM | POA: Diagnosis not present

## 2016-05-17 DIAGNOSIS — E291 Testicular hypofunction: Secondary | ICD-10-CM | POA: Diagnosis not present

## 2016-05-17 DIAGNOSIS — I10 Essential (primary) hypertension: Secondary | ICD-10-CM | POA: Diagnosis not present

## 2016-05-17 DIAGNOSIS — Z1389 Encounter for screening for other disorder: Secondary | ICD-10-CM | POA: Diagnosis not present

## 2016-05-17 DIAGNOSIS — Z0000 Encounter for general adult medical examination without abnormal findings: Secondary | ICD-10-CM | POA: Diagnosis not present

## 2016-05-17 DIAGNOSIS — E109 Type 1 diabetes mellitus without complications: Secondary | ICD-10-CM | POA: Diagnosis not present

## 2016-05-17 DIAGNOSIS — C9000 Multiple myeloma not having achieved remission: Secondary | ICD-10-CM | POA: Diagnosis not present

## 2016-05-17 DIAGNOSIS — N400 Enlarged prostate without lower urinary tract symptoms: Secondary | ICD-10-CM | POA: Diagnosis not present

## 2016-05-21 ENCOUNTER — Other Ambulatory Visit: Payer: Self-pay | Admitting: *Deleted

## 2016-05-21 DIAGNOSIS — C9000 Multiple myeloma not having achieved remission: Secondary | ICD-10-CM

## 2016-05-21 DIAGNOSIS — S91332A Puncture wound without foreign body, left foot, initial encounter: Secondary | ICD-10-CM

## 2016-05-21 MED ORDER — POMALIDOMIDE 4 MG PO CAPS: 4.0000 mg | ORAL_CAPSULE | Freq: Every day | ORAL | 0 refills | Status: DC

## 2016-05-23 ENCOUNTER — Encounter: Payer: Self-pay | Admitting: Hematology & Oncology

## 2016-06-07 ENCOUNTER — Other Ambulatory Visit: Payer: Self-pay | Admitting: Hematology & Oncology

## 2016-06-10 ENCOUNTER — Other Ambulatory Visit: Payer: Self-pay | Admitting: *Deleted

## 2016-06-10 DIAGNOSIS — S52022A Displaced fracture of olecranon process without intraarticular extension of left ulna, initial encounter for closed fracture: Secondary | ICD-10-CM | POA: Diagnosis not present

## 2016-06-10 DIAGNOSIS — R233 Spontaneous ecchymoses: Secondary | ICD-10-CM | POA: Diagnosis not present

## 2016-06-10 DIAGNOSIS — C9000 Multiple myeloma not having achieved remission: Secondary | ICD-10-CM

## 2016-06-10 DIAGNOSIS — E109 Type 1 diabetes mellitus without complications: Secondary | ICD-10-CM | POA: Diagnosis not present

## 2016-06-10 DIAGNOSIS — S52025A Nondisplaced fracture of olecranon process without intraarticular extension of left ulna, initial encounter for closed fracture: Secondary | ICD-10-CM | POA: Diagnosis not present

## 2016-06-10 DIAGNOSIS — M25522 Pain in left elbow: Secondary | ICD-10-CM | POA: Diagnosis not present

## 2016-06-10 DIAGNOSIS — S91332A Puncture wound without foreign body, left foot, initial encounter: Secondary | ICD-10-CM

## 2016-06-10 DIAGNOSIS — S68121A Partial traumatic metacarpophalangeal amputation of left index finger, initial encounter: Secondary | ICD-10-CM | POA: Diagnosis not present

## 2016-06-10 DIAGNOSIS — M7989 Other specified soft tissue disorders: Secondary | ICD-10-CM | POA: Diagnosis not present

## 2016-06-10 MED ORDER — POMALIDOMIDE 4 MG PO CAPS: 4.0000 mg | ORAL_CAPSULE | Freq: Every day | ORAL | 0 refills | Status: DC

## 2016-06-12 DIAGNOSIS — S52032A Displaced fracture of olecranon process with intraarticular extension of left ulna, initial encounter for closed fracture: Secondary | ICD-10-CM | POA: Diagnosis not present

## 2016-06-12 DIAGNOSIS — M25522 Pain in left elbow: Secondary | ICD-10-CM | POA: Diagnosis not present

## 2016-06-12 DIAGNOSIS — Z0189 Encounter for other specified special examinations: Secondary | ICD-10-CM | POA: Diagnosis not present

## 2016-06-17 ENCOUNTER — Other Ambulatory Visit (HOSPITAL_BASED_OUTPATIENT_CLINIC_OR_DEPARTMENT_OTHER): Payer: Medicare Other

## 2016-06-17 ENCOUNTER — Ambulatory Visit (HOSPITAL_BASED_OUTPATIENT_CLINIC_OR_DEPARTMENT_OTHER): Payer: Medicare Other | Admitting: Hematology & Oncology

## 2016-06-17 VITALS — BP 119/47 | HR 66 | Temp 97.5°F | Resp 16 | Wt 209.0 lb

## 2016-06-17 DIAGNOSIS — Z23 Encounter for immunization: Secondary | ICD-10-CM | POA: Diagnosis not present

## 2016-06-17 DIAGNOSIS — C9001 Multiple myeloma in remission: Secondary | ICD-10-CM

## 2016-06-17 LAB — CBC WITH DIFFERENTIAL (CANCER CENTER ONLY)
BASO#: 0.1 10*3/uL (ref 0.0–0.2)
BASO%: 3.6 % — AB (ref 0.0–2.0)
EOS%: 8.1 % — AB (ref 0.0–7.0)
Eosinophils Absolute: 0.3 10*3/uL (ref 0.0–0.5)
HCT: 45.8 % (ref 38.7–49.9)
HEMOGLOBIN: 15.6 g/dL (ref 13.0–17.1)
LYMPH#: 1.2 10*3/uL (ref 0.9–3.3)
LYMPH%: 35.2 % (ref 14.0–48.0)
MCH: 35.5 pg — ABNORMAL HIGH (ref 28.0–33.4)
MCHC: 34.1 g/dL (ref 32.0–35.9)
MCV: 104 fL — ABNORMAL HIGH (ref 82–98)
MONO#: 0.8 10*3/uL (ref 0.1–0.9)
MONO%: 22.4 % — AB (ref 0.0–13.0)
NEUT%: 30.7 % — ABNORMAL LOW (ref 40.0–80.0)
NEUTROS ABS: 1 10*3/uL — AB (ref 1.5–6.5)
RBC: 4.4 10*6/uL (ref 4.20–5.70)
RDW: 14.2 % (ref 11.1–15.7)
WBC: 3.4 10*3/uL — AB (ref 4.0–10.0)

## 2016-06-17 LAB — CMP (CANCER CENTER ONLY)
ALT: 22 U/L (ref 10–47)
AST: 20 U/L (ref 11–38)
Albumin: 3.4 g/dL (ref 3.3–5.5)
Alkaline Phosphatase: 55 U/L (ref 26–84)
BILIRUBIN TOTAL: 1.4 mg/dL (ref 0.20–1.60)
BUN: 13 mg/dL (ref 7–22)
CHLORIDE: 104 meq/L (ref 98–108)
CO2: 31 mEq/L (ref 18–33)
CREATININE: 1.2 mg/dL (ref 0.6–1.2)
Calcium: 9.1 mg/dL (ref 8.0–10.3)
Glucose, Bld: 146 mg/dL — ABNORMAL HIGH (ref 73–118)
Potassium: 4.5 mEq/L (ref 3.3–4.7)
SODIUM: 138 meq/L (ref 128–145)
TOTAL PROTEIN: 6.1 g/dL — AB (ref 6.4–8.1)

## 2016-06-17 NOTE — Progress Notes (Signed)
Hematology and Oncology Follow Up Visit  RIDDICK PERPICH [id number] [birth year] 76 y.o. 06/17/2016   Principle Diagnosis:   Kappa light chain myeloma-clinical relapse  Traumatic fracture of left elbow  Current Therapy:   Patient is on Pomalyst  (Velcade now on hold)     Interim History:  Mr.  Pepple is back for followup. Unfortunately, while he was [place] his beach house a couple weeks ago, he fell on some ice and fractured his left elbow. He is supposed to have surgery tomorrow. He saw a very nice in incredibly gifted orthopedic surgeon down at the [facility]. He saw Dr. Amedeo Plenty. Dr. Cheryll Cockayne wants to try to operate on him tomorrow but wants to hear from [place] first before any surgery is done.   Otherwise, Mr. Dickow has been doing quite well. He is on Pomalidomide. He is on 4 mg a day. He will not take his dose today. I told him to take 2 weeks off so that he can heal up a little bit from his surgery.   His last Kappa Lightchain was 12 back in October. As such, he is responded very nicely.  He's had no problem with fever. He's had no infections. He's had no change in bowel or bladder habits.   Overall, his performance status is ECOG 1  Medications:  Current Outpatient Prescriptions:  .  acyclovir (ZOVIRAX) 400 MG tablet, TAKE 1 TABLET(400 MG) BY MOUTH TWICE DAILY, Disp: 60 tablet, Rfl: 0 .  amLODipine (NORVASC) 2.5 MG tablet, Take 2.5 mg by mouth daily. , Disp: , Rfl:  .  aspirin 81 MG tablet, Take 81 mg by mouth daily.  , Disp: , Rfl:  .  atorvastatin (LIPITOR) 10 MG tablet, Take 10 mg by mouth daily.  , Disp: , Rfl:  .  calcium-vitamin D (OSCAL) 250-125 MG-UNIT per tablet, Take 1 tablet by mouth daily. , Disp: , Rfl:  .  Cholecalciferol (VITAMIN D) 2000 UNITS CAPS, Take by mouth every morning., Disp: , Rfl:  .  cycloSPORINE (RESTASIS) 0.05 % ophthalmic emulsion, 1 drop 2 (two) times daily.  , Disp: , Rfl:  .  fish oil-omega-3 fatty acids 1000 MG capsule, Take 2 g by mouth 2 (two) times  daily. , Disp: , Rfl:  .  insulin lispro (HUMALOG) 100 UNIT/ML injection, Inject into the skin. Insulin pump, Disp: , Rfl:  .  montelukast (SINGULAIR) 10 MG tablet, Take 10 mg by mouth at bedtime.  , Disp: , Rfl:  .  pomalidomide (POMALYST) 4 MG capsule, Take 1 capsule (4 mg total) by mouth daily. Take with water on days 1-21. Repeat every 28 days. [id number], Disp: 21 capsule, Rfl: 0 .  telmisartan-hydrochlorothiazide (MICARDIS HCT) 80-12.5 MG per tablet, Take 1 tablet by mouth daily. , Disp: , Rfl:   Allergies: No Known Allergies  Past Medical History, Surgical history, Social history, and Family History were reviewed and updated.  Review of Systems: As above  Physical Exam:  weight is 209 lb (94.8 kg). His oral temperature is 97.5 F (36.4 C). His blood pressure is 119/47 (abnormal) and his pulse is 66. His respiration is 16 and oxygen saturation is 97%.   Head and neck exam shows no ocular or oral lesions. He has no palpable cervical or supraclavicular lymph nodes. Thyroid is nonpalpable.Lungs are clear. Cardiac exam regular rate and rhythm with a normal S1 and S2. There are no murmurs, rubs or bruits. . Abdomen is soft. He has good bowel sounds. There is no palpable  abdominal mass. There is no palpable liver or spleen. Back exam shows no tenderness over the spine, ribs or hips. Extremities shows his left arm to be in a sling. He has a soft cast on his left forearm. He has obvious swelling and ecchymoses in his left hand and fingers. Otherwise, there is no no clubbing cyanosis or edema. Skin exam no rashes ecchymosis or petechia.  Neurological exam is nonfocal.  Lab Results  Component Value Date   WBC 3.4 (L) 06/17/2016   HGB 15.6 06/17/2016   HCT 45.8 06/17/2016   MCV 104 (H) 06/17/2016   PLT 119 Few Large & Occ giant platelets (L) 06/17/2016     Chemistry      Component Value Date/Time   NA 138 06/17/2016 1006   NA 141 03/05/2016 1043   K 4.5 06/17/2016 1006   K 4.7  03/05/2016 1043   CL 104 06/17/2016 1006   CO2 31 06/17/2016 1006   CO2 27 03/05/2016 1043   BUN 13 06/17/2016 1006   BUN 17.4 03/05/2016 1043   CREATININE 1.2 06/17/2016 1006   CREATININE 0.9 03/05/2016 1043      Component Value Date/Time   CALCIUM 9.1 06/17/2016 1006   CALCIUM 9.3 03/05/2016 1043   ALKPHOS 55 06/17/2016 1006   ALKPHOS 54 03/05/2016 1043   AST 20 06/17/2016 1006   AST 20 03/05/2016 1043   ALT 22 06/17/2016 1006   ALT 25 03/05/2016 1043   BILITOT 1.40 06/17/2016 1006   BILITOT 1.21 (H) 03/05/2016 1043       Impression and Plan: Mr. Hartpence is a 76 year old gentleman with kappa light chain myeloma. He got into a remission currently quickly. We then got him to transplant. His been about 8 years since transplant. The ironic thing here is that too was before his transplant 8 years ago, he fell and broke his left lower leg. He required surgery for this.  His labs look okay. I really don't think that there'll be a problem with him getting surgery or having a problem with healing. His immune system should be okay.  I will like to have his labs checked a week after his surgery. I just want to make sure that everything is okay with his white cell count and platelet count.  I will like to see him back in one month. He will be in the area as he and his wife will be babysitting for a couple grandsons.  I spent about 35 minutes with he and his wife.  If we continue to see improvement in his light chains, I may cut his dose of Pomalidomide down to 3 mg.   [name], MD 1/22/201812:21 PM

## 2016-06-18 DIAGNOSIS — S52022A Displaced fracture of olecranon process without intraarticular extension of left ulna, initial encounter for closed fracture: Secondary | ICD-10-CM | POA: Diagnosis not present

## 2016-06-18 DIAGNOSIS — S52032A Displaced fracture of olecranon process with intraarticular extension of left ulna, initial encounter for closed fracture: Secondary | ICD-10-CM | POA: Diagnosis not present

## 2016-06-18 LAB — IGG, IGA, IGM
IGA/IMMUNOGLOBULIN A, SERUM: 165 mg/dL (ref 61–437)
IgG, Qn, Serum: 739 mg/dL (ref 700–1600)
IgM, Qn, Serum: 27 mg/dL (ref 15–143)

## 2016-06-19 LAB — KAPPA/LAMBDA LIGHT CHAINS
Ig Kappa Free Light Chain: 127.9 mg/L — ABNORMAL HIGH (ref 3.3–19.4)
Ig Lambda Free Light Chain: 16.9 mg/L (ref 5.7–26.3)
KAPPA/LAMBDA FLC RATIO: 7.57 — AB (ref 0.26–1.65)

## 2016-06-19 LAB — PROTEIN ELECTROPHORESIS, SERUM, WITH REFLEX
A/G RATIO SPE: 1.4 (ref 0.7–1.7)
ALBUMIN: 3.3 g/dL (ref 2.9–4.4)
ALPHA 1: 0.2 g/dL (ref 0.0–0.4)
Alpha 2: 0.6 g/dL (ref 0.4–1.0)
Beta: 0.7 g/dL (ref 0.7–1.3)
Gamma Globulin: 0.8 g/dL (ref 0.4–1.8)
Globulin, Total: 2.3 g/dL (ref 2.2–3.9)
Total Protein: 5.6 g/dL — ABNORMAL LOW (ref 6.0–8.5)

## 2016-06-26 DIAGNOSIS — C9000 Multiple myeloma not having achieved remission: Secondary | ICD-10-CM | POA: Diagnosis not present

## 2016-06-28 DIAGNOSIS — Z4789 Encounter for other orthopedic aftercare: Secondary | ICD-10-CM | POA: Diagnosis not present

## 2016-06-28 DIAGNOSIS — E119 Type 2 diabetes mellitus without complications: Secondary | ICD-10-CM | POA: Diagnosis not present

## 2016-07-09 ENCOUNTER — Other Ambulatory Visit: Payer: Self-pay | Admitting: *Deleted

## 2016-07-09 DIAGNOSIS — C9000 Multiple myeloma not having achieved remission: Secondary | ICD-10-CM

## 2016-07-09 DIAGNOSIS — S91332A Puncture wound without foreign body, left foot, initial encounter: Secondary | ICD-10-CM

## 2016-07-09 MED ORDER — POMALIDOMIDE 4 MG PO CAPS: 4.0000 mg | ORAL_CAPSULE | Freq: Every day | ORAL | 0 refills | Status: DC

## 2016-07-12 ENCOUNTER — Other Ambulatory Visit: Payer: Self-pay | Admitting: *Deleted

## 2016-07-12 ENCOUNTER — Other Ambulatory Visit: Payer: Self-pay | Admitting: Hematology & Oncology

## 2016-07-12 MED ORDER — ACYCLOVIR 400 MG PO TABS: ORAL_TABLET | ORAL | 0 refills | Status: DC

## 2016-07-18 ENCOUNTER — Other Ambulatory Visit (HOSPITAL_BASED_OUTPATIENT_CLINIC_OR_DEPARTMENT_OTHER): Payer: Medicare Other

## 2016-07-18 ENCOUNTER — Ambulatory Visit (HOSPITAL_BASED_OUTPATIENT_CLINIC_OR_DEPARTMENT_OTHER): Payer: Medicare Other | Admitting: Hematology & Oncology

## 2016-07-18 VITALS — BP 136/58 | HR 56 | Temp 97.8°F | Wt 207.8 lb

## 2016-07-18 DIAGNOSIS — C9001 Multiple myeloma in remission: Secondary | ICD-10-CM

## 2016-07-18 DIAGNOSIS — C9002 Multiple myeloma in relapse: Secondary | ICD-10-CM

## 2016-07-18 DIAGNOSIS — C9000 Multiple myeloma not having achieved remission: Secondary | ICD-10-CM

## 2016-07-18 LAB — COMPREHENSIVE METABOLIC PANEL
ALT: 20 U/L (ref 0–55)
AST: 15 U/L (ref 5–34)
Albumin: 3.8 g/dL (ref 3.5–5.0)
Alkaline Phosphatase: 58 U/L (ref 40–150)
Anion Gap: 7 mEq/L (ref 3–11)
BUN: 12.2 mg/dL (ref 7.0–26.0)
CO2: 27 meq/L (ref 22–29)
CREATININE: 0.9 mg/dL (ref 0.7–1.3)
Calcium: 8.9 mg/dL (ref 8.4–10.4)
Chloride: 105 mEq/L (ref 98–109)
EGFR: 80 mL/min/{1.73_m2} — ABNORMAL LOW (ref >90)
GLUCOSE: 126 mg/dL (ref 70–140)
Potassium: 4.4 mEq/L (ref 3.5–5.1)
Sodium: 139 mEq/L (ref 136–145)
Total Bilirubin: 1.24 mg/dL — ABNORMAL HIGH (ref 0.20–1.20)
Total Protein: 6.5 g/dL (ref 6.4–8.3)

## 2016-07-18 LAB — CBC WITH DIFFERENTIAL (CANCER CENTER ONLY)
BASO#: 0.1 10*3/uL (ref 0.0–0.2)
BASO%: 1.4 % (ref 0.0–2.0)
EOS%: 6.6 % (ref 0.0–7.0)
Eosinophils Absolute: 0.2 10*3/uL (ref 0.0–0.5)
HEMATOCRIT: 47.4 % (ref 38.7–49.9)
HGB: 16.4 g/dL (ref 13.0–17.1)
LYMPH#: 1.1 10*3/uL (ref 0.9–3.3)
LYMPH%: 32.3 % (ref 14.0–48.0)
MCH: 35.5 pg — ABNORMAL HIGH (ref 28.0–33.4)
MCHC: 34.6 g/dL (ref 32.0–35.9)
MCV: 103 fL — ABNORMAL HIGH (ref 82–98)
MONO#: 0.7 10*3/uL (ref 0.1–0.9)
MONO%: 20.7 % — ABNORMAL HIGH (ref 0.0–13.0)
NEUT#: 1.4 10*3/uL — ABNORMAL LOW (ref 1.5–6.5)
NEUT%: 39 % — AB (ref 40.0–80.0)
PLATELETS: 117 10*3/uL — AB (ref 145–400)
RBC: 4.62 10*6/uL (ref 4.20–5.70)
RDW: 13.4 % (ref 11.1–15.7)
WBC: 3.5 10*3/uL — ABNORMAL LOW (ref 4.0–10.0)

## 2016-07-18 MED ORDER — ORPHENADRINE CITRATE ER 100 MG PO TB12: 100.0000 mg | ORAL_TABLET | Freq: Two times a day (BID) | ORAL | 1 refills | Status: DC

## 2016-07-18 MED ORDER — ACYCLOVIR 400 MG PO TABS: ORAL_TABLET | ORAL | 4 refills | Status: DC

## 2016-07-18 NOTE — Progress Notes (Signed)
Hematology and Oncology Follow Up Visit  Rickey [name] [id number] 16-Oct-1940 76 y.o. 07/18/2016   Principle Diagnosis:   Kappa light chain myeloma-clinical relapse  Traumatic fracture of left elbow  Current Therapy:   Patient is on Pomalyst  (Velcade now on hold)     Interim History:  Rickey [name] is back for followup. His surgery went well. He got through surgery without any problems. There were no, occasions with respect to infection or bleeding.  His last myeloma studies done back in January show a Kappa light chain of 13 mg/dL. This is up slightly.  Of note, he is been off Velcade probably for about 10 months.  His blood sugars have been doing okay. He is on a insulin pump.  He has had no cough or shortness of breath. He has had no fever. Thankfully, he is got through the flu season without any influenza.  Overall, his performance status is ECOG 1  Medications:  Current Outpatient Prescriptions:  .  acyclovir (ZOVIRAX) 400 MG tablet, TAKE 1 TABLET(400 MG) BY MOUTH TWICE DAILY. 90 day supply, Disp: 180 tablet, Rfl: 0 .  amLODipine (NORVASC) 2.5 MG tablet, Take 2.5 mg by mouth daily. , Disp: , Rfl:  .  aspirin 81 MG tablet, Take 81 mg by mouth daily.  , Disp: , Rfl:  .  atorvastatin (LIPITOR) 10 MG tablet, Take 10 mg by mouth daily.  , Disp: , Rfl:  .  calcium-vitamin D (OSCAL) 250-125 MG-UNIT per tablet, Take 1 tablet by mouth daily. , Disp: , Rfl:  .  Cholecalciferol (VITAMIN D) 2000 UNITS CAPS, Take by mouth every morning., Disp: , Rfl:  .  cycloSPORINE (RESTASIS) 0.05 % ophthalmic emulsion, 1 drop 2 (two) times daily.  , Disp: , Rfl:  .  fish oil-omega-3 fatty acids 1000 MG capsule, Take 2 g by mouth 2 (two) times daily. , Disp: , Rfl:  .  insulin lispro (HUMALOG) 100 UNIT/ML injection, Inject into the skin. Insulin pump, Disp: , Rfl:  .  montelukast (SINGULAIR) 10 MG tablet, Take 10 mg by mouth at bedtime.  , Disp: , Rfl:  .  pomalidomide (POMALYST) 4 MG capsule, Take  1 capsule (4 mg total) by mouth daily. Take with water on days 1-21. Repeat every 28 days. [id number], Disp: 21 capsule, Rfl: 0 .  telmisartan-hydrochlorothiazide (MICARDIS HCT) 80-12.5 MG per tablet, Take 1 tablet by mouth daily. , Disp: , Rfl:   Allergies: No Known Allergies  Past Medical History, Surgical history, Social history, and Family History were reviewed and updated.  Review of Systems: As above  Physical Exam:  weight is 207 lb 12.8 oz (94.3 kg). His oral temperature is 97.8 F (36.6 C). His blood pressure is 136/58 (abnormal) and his pulse is 56 (abnormal).   Head and neck exam shows no ocular or oral lesions. He has no palpable cervical or supraclavicular lymph nodes. Thyroid is nonpalpable.Lungs are clear. Cardiac exam regular rate and rhythm with a normal S1 and S2. There are no murmurs, rubs or bruits. . Abdomen is soft. He has good bowel sounds. There is no palpable abdominal mass. There is no palpable liver or spleen. Back exam shows no tenderness over the spine, ribs or hips. Extremities shows his left arm to be in a sling. He has a soft cast on his left forearm. He has obvious swelling and ecchymoses in his left hand and fingers. Otherwise, there is no no clubbing cyanosis or edema. Skin exam no rashes ecchymosis  or petechia.  Neurological exam is nonfocal.  Lab Results  Component Value Date   WBC 3.5 (L) 07/18/2016   HGB 16.4 07/18/2016   HCT 47.4 07/18/2016   MCV 103 (H) 07/18/2016   PLT 117 (L) 07/18/2016     Chemistry      Component Value Date/Time   NA 138 06/17/2016 1006   NA 141 03/05/2016 1043   K 4.5 06/17/2016 1006   K 4.7 03/05/2016 1043   CL 104 06/17/2016 1006   CO2 31 06/17/2016 1006   CO2 27 03/05/2016 1043   BUN 13 06/17/2016 1006   BUN 17.4 03/05/2016 1043   CREATININE 1.2 06/17/2016 1006   CREATININE 0.9 03/05/2016 1043      Component Value Date/Time   CALCIUM 9.1 06/17/2016 1006   CALCIUM 9.3 03/05/2016 1043   ALKPHOS 55 06/17/2016  1006   ALKPHOS 54 03/05/2016 1043   AST 20 06/17/2016 1006   AST 20 03/05/2016 1043   ALT 22 06/17/2016 1006   ALT 25 03/05/2016 1043   BILITOT 1.40 06/17/2016 1006   BILITOT 1.21 (H) 03/05/2016 1043       Impression and Plan: Rickey [name] is a 76 year old gentleman with kappa light chain myeloma. He got into a remission currently quickly. We then got him to transplant. His been about 8 years since transplant. The ironic thing here is that too was before his transplant 8 years ago, he fell and broke his left lower leg. He required surgery for this.  If his light chains are further elevated, then we may have to consider getting him back on Velcade or more likely Ninlaro. Hopefully, his insurance company will approve Ninlaro. This would be so which more convenient for him since this is oral and he travels a lot.  I would like to see him back in another 2 months. Everything looks more active, then we will see him back sooner.    [name], MD 2/22/201812:33 PM

## 2016-07-18 NOTE — Addendum Note (Signed)
Addended by: [name] on: 07/18/2016 12:51 PM   Modules accepted: Orders

## 2016-07-19 ENCOUNTER — Telehealth: Payer: Self-pay | Admitting: *Deleted

## 2016-07-19 LAB — IGG, IGA, IGM
IGA/IMMUNOGLOBULIN A, SERUM: 178 mg/dL (ref 61–437)
IGM (IMMUNOGLOBIN M), SRM: 29 mg/dL (ref 15–143)
IgG, Qn, Serum: 750 mg/dL (ref 700–1600)

## 2016-07-19 LAB — KAPPA/LAMBDA LIGHT CHAINS
Ig Kappa Free Light Chain: 106.9 mg/L — ABNORMAL HIGH (ref 3.3–19.4)
Ig Lambda Free Light Chain: 12.6 mg/L (ref 5.7–26.3)
Kappa/Lambda FluidC Ratio: 8.48 — ABNORMAL HIGH (ref 0.26–1.65)

## 2016-07-19 NOTE — Telephone Encounter (Addendum)
Patient is aware of results  ----- Message from [name], MD sent at 07/19/2016  2:26 PM EST ----- Call - the kappa light chain is a little better!!  It went from 13 down to 11!!  No need to add ninlaro!!  pete

## 2016-07-22 LAB — PROTEIN ELECTROPHORESIS, SERUM, WITH REFLEX
A/G Ratio: 1.5 (ref 0.7–1.7)
ALPHA 1: 0.2 g/dL (ref 0.0–0.4)
Albumin: 3.6 g/dL (ref 2.9–4.4)
Alpha 2: 0.6 g/dL (ref 0.4–1.0)
BETA: 0.8 g/dL (ref 0.7–1.3)
GAMMA GLOBULIN: 0.9 g/dL (ref 0.4–1.8)
Globulin, Total: 2.4 g/dL (ref 2.2–3.9)
TOTAL PROTEIN: 6 g/dL (ref 6.0–8.5)

## 2016-07-31 DIAGNOSIS — Z4789 Encounter for other orthopedic aftercare: Secondary | ICD-10-CM | POA: Diagnosis not present

## 2016-07-31 DIAGNOSIS — S52032D Displaced fracture of olecranon process with intraarticular extension of left ulna, subsequent encounter for closed fracture with routine healing: Secondary | ICD-10-CM | POA: Diagnosis not present

## 2016-08-12 ENCOUNTER — Other Ambulatory Visit: Payer: Self-pay | Admitting: *Deleted

## 2016-08-12 DIAGNOSIS — C9000 Multiple myeloma not having achieved remission: Secondary | ICD-10-CM

## 2016-08-12 DIAGNOSIS — S91332A Puncture wound without foreign body, left foot, initial encounter: Secondary | ICD-10-CM

## 2016-08-12 MED ORDER — POMALIDOMIDE 4 MG PO CAPS: 4.0000 mg | ORAL_CAPSULE | Freq: Every day | ORAL | 0 refills | Status: DC

## 2016-09-04 DIAGNOSIS — Z4789 Encounter for other orthopedic aftercare: Secondary | ICD-10-CM | POA: Diagnosis not present

## 2016-09-04 DIAGNOSIS — S52032D Displaced fracture of olecranon process with intraarticular extension of left ulna, subsequent encounter for closed fracture with routine healing: Secondary | ICD-10-CM | POA: Diagnosis not present

## 2016-09-12 ENCOUNTER — Other Ambulatory Visit: Payer: Self-pay | Admitting: *Deleted

## 2016-09-12 DIAGNOSIS — C9000 Multiple myeloma not having achieved remission: Secondary | ICD-10-CM

## 2016-09-12 DIAGNOSIS — S91332A Puncture wound without foreign body, left foot, initial encounter: Secondary | ICD-10-CM

## 2016-09-12 MED ORDER — POMALIDOMIDE 4 MG PO CAPS: 4.0000 mg | ORAL_CAPSULE | Freq: Every day | ORAL | 0 refills | Status: DC

## 2016-09-27 ENCOUNTER — Ambulatory Visit: Payer: Medicare Other | Admitting: Hematology & Oncology

## 2016-09-27 ENCOUNTER — Other Ambulatory Visit: Payer: Medicare Other

## 2016-09-30 ENCOUNTER — Other Ambulatory Visit: Payer: Self-pay | Admitting: *Deleted

## 2016-09-30 ENCOUNTER — Other Ambulatory Visit (HOSPITAL_BASED_OUTPATIENT_CLINIC_OR_DEPARTMENT_OTHER): Payer: Medicare Other

## 2016-09-30 ENCOUNTER — Ambulatory Visit (HOSPITAL_BASED_OUTPATIENT_CLINIC_OR_DEPARTMENT_OTHER): Payer: Medicare Other | Admitting: Hematology & Oncology

## 2016-09-30 VITALS — BP 125/69 | HR 62 | Temp 98.3°F | Resp 17 | Wt 205.1 lb

## 2016-09-30 DIAGNOSIS — C9000 Multiple myeloma not having achieved remission: Secondary | ICD-10-CM

## 2016-09-30 DIAGNOSIS — Z1389 Encounter for screening for other disorder: Secondary | ICD-10-CM | POA: Diagnosis not present

## 2016-09-30 DIAGNOSIS — S91332A Puncture wound without foreign body, left foot, initial encounter: Secondary | ICD-10-CM

## 2016-09-30 DIAGNOSIS — Z6828 Body mass index (BMI) 28.0-28.9, adult: Secondary | ICD-10-CM | POA: Diagnosis not present

## 2016-09-30 DIAGNOSIS — I10 Essential (primary) hypertension: Secondary | ICD-10-CM | POA: Diagnosis not present

## 2016-09-30 DIAGNOSIS — E109 Type 1 diabetes mellitus without complications: Secondary | ICD-10-CM | POA: Diagnosis not present

## 2016-09-30 LAB — CBC WITH DIFFERENTIAL (CANCER CENTER ONLY)
BASO#: 0.1 10*3/uL (ref 0.0–0.2)
BASO%: 3.4 % — AB (ref 0.0–2.0)
EOS%: 4.5 % (ref 0.0–7.0)
Eosinophils Absolute: 0.2 10*3/uL (ref 0.0–0.5)
HEMATOCRIT: 44.4 % (ref 38.7–49.9)
HEMOGLOBIN: 15.4 g/dL (ref 13.0–17.1)
LYMPH#: 1.3 10*3/uL (ref 0.9–3.3)
LYMPH%: 36.9 % (ref 14.0–48.0)
MCH: 35.6 pg — ABNORMAL HIGH (ref 28.0–33.4)
MCHC: 34.7 g/dL (ref 32.0–35.9)
MCV: 103 fL — AB (ref 82–98)
MONO#: 0.5 10*3/uL (ref 0.1–0.9)
MONO%: 13.8 % — ABNORMAL HIGH (ref 0.0–13.0)
NEUT%: 41.4 % (ref 40.0–80.0)
NEUTROS ABS: 1.5 10*3/uL (ref 1.5–6.5)
Platelets: 181 10*3/uL (ref 145–400)
RBC: 4.32 10*6/uL (ref 4.20–5.70)
RDW: 14 % (ref 11.1–15.7)
WBC: 3.6 10*3/uL — ABNORMAL LOW (ref 4.0–10.0)

## 2016-09-30 LAB — CMP (CANCER CENTER ONLY)
ALBUMIN: 3.4 g/dL (ref 3.3–5.5)
ALK PHOS: 48 U/L (ref 26–84)
ALT: 26 U/L (ref 10–47)
AST: 26 U/L (ref 11–38)
BILIRUBIN TOTAL: 1.1 mg/dL (ref 0.20–1.60)
BUN, Bld: 12 mg/dL (ref 7–22)
CO2: 29 mEq/L (ref 18–33)
Calcium: 8.7 mg/dL (ref 8.0–10.3)
Chloride: 105 mEq/L (ref 98–108)
Creat: 0.9 mg/dl (ref 0.6–1.2)
Glucose, Bld: 140 mg/dL — ABNORMAL HIGH (ref 73–118)
Potassium: 4.2 mEq/L (ref 3.3–4.7)
Sodium: 142 mEq/L (ref 128–145)
Total Protein: 6.1 g/dL — ABNORMAL LOW (ref 6.4–8.1)

## 2016-09-30 MED ORDER — POMALIDOMIDE 4 MG PO CAPS: 4.0000 mg | ORAL_CAPSULE | Freq: Every day | ORAL | 0 refills | Status: DC

## 2016-09-30 NOTE — Progress Notes (Signed)
Hematology and Oncology Follow Up Visit  Rickey [name] [id number] July 30, 1940 76 y.o. 09/30/2016   Principle Diagnosis:   Kappa light chain myeloma-clinical relapse  Traumatic fracture of left elbow - status post surgical repair in February 2018  Current Therapy:   Patient is on Pomalyst  (Velcade now on hold)     Interim History:  Rickey [name] is back for followup. He is doing quite well. He and his [place] are [place] They've avoided the mountains during the wintertime and spend it at their beach home.  His left elbow has healed up quite well. He is able to move his left arm without any difficulties.  His light chain improved. If it were, his light chain was 11 mg/dL. As such, we have not had to add Velcade.  He has had no fever. He's had no change in bowel or bladder habits.  His diabetes is doing well. He has an insulin pump that he is using.  Overall, his performance status is ECOG 1  Medications:  Current Outpatient Prescriptions:  .  acyclovir (ZOVIRAX) 400 MG tablet, TAKE 1 TABLET(400 MG) BY MOUTH TWICE DAILY. 90 day supply, Disp: 180 tablet, Rfl: 4 .  amLODipine (NORVASC) 2.5 MG tablet, Take 2.5 mg by mouth daily. , Disp: , Rfl:  .  aspirin 81 MG tablet, Take 81 mg by mouth daily.  , Disp: , Rfl:  .  atorvastatin (LIPITOR) 10 MG tablet, Take 10 mg by mouth daily.  , Disp: , Rfl:  .  calcium-vitamin D (OSCAL) 250-125 MG-UNIT per tablet, Take 1 tablet by mouth daily. , Disp: , Rfl:  .  Cholecalciferol (VITAMIN D) 2000 UNITS CAPS, Take by mouth every morning., Disp: , Rfl:  .  cycloSPORINE (RESTASIS) 0.05 % ophthalmic emulsion, 1 drop 2 (two) times daily.  , Disp: , Rfl:  .  fish oil-omega-3 fatty acids 1000 MG capsule, Take 2 g by mouth 2 (two) times daily. , Disp: , Rfl:  .  insulin lispro (HUMALOG) 100 UNIT/ML injection, Inject into the skin. Insulin pump, Disp: , Rfl:  .  montelukast (SINGULAIR) 10 MG tablet, Take 10 mg by  mouth at bedtime.  , Disp: , Rfl:  .  pomalidomide (POMALYST) 4 MG capsule, Take 1 capsule (4 mg total) by mouth daily. Take with water on days 1-21. Repeat every 28 days. [id number], Disp: 21 capsule, Rfl: 0 .  telmisartan-hydrochlorothiazide (MICARDIS HCT) 80-12.5 MG per tablet, Take 1 tablet by mouth daily. , Disp: , Rfl:   Allergies: No Known Allergies  Past Medical History, Surgical history, Social history, and Family History were reviewed and updated.  Review of Systems: As above  Physical Exam:  weight is 205 lb 1.9 oz (93 kg). His oral temperature is 98.3 F (36.8 C). His blood pressure is 125/69 and his pulse is 62. His respiration is 17 and oxygen saturation is 100%.   Head and neck exam shows no ocular or oral lesions. He has no palpable cervical or supraclavicular lymph nodes. Thyroid is nonpalpable.Lungs are clear. Cardiac exam regular rate and rhythm with a normal S1 and S2. There are no murmurs, rubs or bruits. . Abdomen is soft. He has good bowel sounds. There is no palpable abdominal mass. There is no palpable liver or spleen. Back exam shows no tenderness over the spine, ribs or hips. Extremities shows his left arm to be in a sling. He has a soft cast on his left  forearm. He has obvious swelling and ecchymoses in his left hand and fingers. Otherwise, there is no no clubbing cyanosis or edema. Skin exam no rashes ecchymosis or petechia.  Neurological exam is nonfocal.  Lab Results  Component Value Date   WBC 3.6 (L) 09/30/2016   HGB 15.4 09/30/2016   HCT 44.4 09/30/2016   MCV 103 (H) 09/30/2016   PLT 181 09/30/2016     Chemistry      Component Value Date/Time   NA 142 09/30/2016 1039   NA 139 07/18/2016 1135   K 4.2 09/30/2016 1039   K 4.4 07/18/2016 1135   CL 105 09/30/2016 1039   CO2 29 09/30/2016 1039   CO2 27 07/18/2016 1135   BUN 12 09/30/2016 1039   BUN 12.2 07/18/2016 1135   CREATININE 0.9 09/30/2016 1039   CREATININE 0.9 07/18/2016 1135       Component Value Date/Time   CALCIUM 8.7 09/30/2016 1039   CALCIUM 8.9 07/18/2016 1135   ALKPHOS 48 09/30/2016 1039   ALKPHOS 58 07/18/2016 1135   AST 26 09/30/2016 1039   AST 15 07/18/2016 1135   ALT 26 09/30/2016 1039   ALT 20 07/18/2016 1135   BILITOT 1.10 09/30/2016 1039   BILITOT 1.24 (H) 07/18/2016 1135       Impression and Plan: Rickey [name] is a 76 year old gentleman with kappa light chain myeloma. He got into a remission currently quickly. We then got him to transplant. He has been about 9 years since transplant.   For now, we'll just continue him on the Pomalidomide. He has done well with this.   We will plan to get him back in 3 more months. I think this would be reasonable.  As always, they will be doing a lot of mountain activities.   [name], MD 5/7/201811:39 AM

## 2016-10-01 LAB — IGG, IGA, IGM
IGA/IMMUNOGLOBULIN A, SERUM: 178 mg/dL (ref 61–437)
IGM (IMMUNOGLOBIN M), SRM: 22 mg/dL (ref 15–143)
IgG, Qn, Serum: 711 mg/dL (ref 700–1600)

## 2016-10-01 LAB — PROTEIN ELECTROPHORESIS, SERUM, WITH REFLEX
A/G RATIO SPE: 1.3 (ref 0.7–1.7)
ALPHA 2: 0.6 g/dL (ref 0.4–1.0)
Albumin: 3.3 g/dL (ref 2.9–4.4)
Alpha 1: 0.2 g/dL (ref 0.0–0.4)
BETA: 0.8 g/dL (ref 0.7–1.3)
GAMMA GLOBULIN: 0.9 g/dL (ref 0.4–1.8)
GLOBULIN, TOTAL: 2.6 g/dL (ref 2.2–3.9)
Total Protein: 5.9 g/dL — ABNORMAL LOW (ref 6.0–8.5)

## 2016-10-01 LAB — KAPPA/LAMBDA LIGHT CHAINS
IG KAPPA FREE LIGHT CHAIN: 146.1 mg/L — AB (ref 3.3–19.4)
IG LAMBDA FREE LIGHT CHAIN: 14.9 mg/L (ref 5.7–26.3)
KAPPA/LAMBDA FLC RATIO: 9.81 — AB (ref 0.26–1.65)

## 2016-10-02 DIAGNOSIS — Z87891 Personal history of nicotine dependence: Secondary | ICD-10-CM | POA: Diagnosis not present

## 2016-10-02 DIAGNOSIS — H6123 Impacted cerumen, bilateral: Secondary | ICD-10-CM | POA: Diagnosis not present

## 2016-10-23 ENCOUNTER — Telehealth: Payer: Self-pay | Admitting: *Deleted

## 2016-10-23 NOTE — Telephone Encounter (Signed)
Patient wants to know if he can have the [facility] vaccine.  Per Dr Marin Olp patient is cleared to receive this vaccine. Patient aware.

## 2016-10-29 ENCOUNTER — Other Ambulatory Visit: Payer: Self-pay | Admitting: *Deleted

## 2016-10-29 DIAGNOSIS — C9000 Multiple myeloma not having achieved remission: Secondary | ICD-10-CM

## 2016-10-29 DIAGNOSIS — S91332A Puncture wound without foreign body, left foot, initial encounter: Secondary | ICD-10-CM

## 2016-10-29 MED ORDER — POMALIDOMIDE 4 MG PO CAPS: 4.0000 mg | ORAL_CAPSULE | Freq: Every day | ORAL | 0 refills | Status: DC

## 2016-12-04 ENCOUNTER — Other Ambulatory Visit: Payer: Self-pay | Admitting: *Deleted

## 2016-12-04 DIAGNOSIS — C9000 Multiple myeloma not having achieved remission: Secondary | ICD-10-CM

## 2016-12-04 DIAGNOSIS — S91332A Puncture wound without foreign body, left foot, initial encounter: Secondary | ICD-10-CM

## 2016-12-04 MED ORDER — POMALIDOMIDE 4 MG PO CAPS: 4.0000 mg | ORAL_CAPSULE | Freq: Every day | ORAL | 0 refills | Status: DC

## 2016-12-30 ENCOUNTER — Other Ambulatory Visit: Payer: Self-pay | Admitting: *Deleted

## 2016-12-30 DIAGNOSIS — S91332A Puncture wound without foreign body, left foot, initial encounter: Secondary | ICD-10-CM

## 2016-12-30 DIAGNOSIS — C9000 Multiple myeloma not having achieved remission: Secondary | ICD-10-CM

## 2016-12-30 MED ORDER — POMALIDOMIDE 4 MG PO CAPS: 4.0000 mg | ORAL_CAPSULE | Freq: Every day | ORAL | 0 refills | Status: DC

## 2017-01-02 ENCOUNTER — Other Ambulatory Visit (HOSPITAL_BASED_OUTPATIENT_CLINIC_OR_DEPARTMENT_OTHER): Payer: Medicare Other

## 2017-01-02 ENCOUNTER — Ambulatory Visit (HOSPITAL_BASED_OUTPATIENT_CLINIC_OR_DEPARTMENT_OTHER): Payer: Medicare Other | Admitting: Hematology & Oncology

## 2017-01-02 VITALS — BP 114/52 | HR 62 | Temp 97.7°F | Resp 16 | Wt 209.0 lb

## 2017-01-02 DIAGNOSIS — C9000 Multiple myeloma not having achieved remission: Secondary | ICD-10-CM

## 2017-01-02 DIAGNOSIS — C9001 Multiple myeloma in remission: Secondary | ICD-10-CM | POA: Diagnosis not present

## 2017-01-02 DIAGNOSIS — Z9484 Stem cells transplant status: Secondary | ICD-10-CM | POA: Diagnosis not present

## 2017-01-02 LAB — CBC WITH DIFFERENTIAL (CANCER CENTER ONLY)
BASO#: 0.1 10*3/uL (ref 0.0–0.2)
BASO%: 2.1 % — AB (ref 0.0–2.0)
EOS ABS: 0.2 10*3/uL (ref 0.0–0.5)
EOS%: 5 % (ref 0.0–7.0)
HCT: 44 % (ref 38.7–49.9)
HEMOGLOBIN: 15.3 g/dL (ref 13.0–17.1)
LYMPH#: 1.5 10*3/uL (ref 0.9–3.3)
LYMPH%: 36.3 % (ref 14.0–48.0)
MCH: 36.1 pg — AB (ref 28.0–33.4)
MCHC: 34.8 g/dL (ref 32.0–35.9)
MCV: 104 fL — AB (ref 82–98)
MONO#: 0.8 10*3/uL (ref 0.1–0.9)
MONO%: 18 % — AB (ref 0.0–13.0)
NEUT%: 38.6 % — ABNORMAL LOW (ref 40.0–80.0)
NEUTROS ABS: 1.6 10*3/uL (ref 1.5–6.5)
PLATELETS: 126 10*3/uL — AB (ref 145–400)
RBC: 4.24 10*6/uL (ref 4.20–5.70)
RDW: 13.9 % (ref 11.1–15.7)
WBC: 4.2 10*3/uL (ref 4.0–10.0)

## 2017-01-02 LAB — CMP (CANCER CENTER ONLY)
ALBUMIN: 3.2 g/dL — AB (ref 3.3–5.5)
ALK PHOS: 56 U/L (ref 26–84)
ALT: 18 U/L (ref 10–47)
AST: 21 U/L (ref 11–38)
BUN: 19 mg/dL (ref 7–22)
CO2: 28 mEq/L (ref 18–33)
CREATININE: 1.3 mg/dL — AB (ref 0.6–1.2)
Calcium: 8.8 mg/dL (ref 8.0–10.3)
Chloride: 104 mEq/L (ref 98–108)
Glucose, Bld: 179 mg/dL — ABNORMAL HIGH (ref 73–118)
Potassium: 4.1 mEq/L (ref 3.3–4.7)
SODIUM: 139 meq/L (ref 128–145)
TOTAL PROTEIN: 6.2 g/dL — AB (ref 6.4–8.1)
Total Bilirubin: 1.4 mg/dl (ref 0.20–1.60)
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
## 2017-01-03 LAB — PROTEIN ELECTROPHORESIS, SERUM, WITH REFLEX
A/G RATIO SPE: 1.3 (ref 0.7–1.7)
ALPHA 1: 0.2 g/dL (ref 0.0–0.4)
Albumin: 3.2 g/dL (ref 2.9–4.4)
Alpha 2: 0.6 g/dL (ref 0.4–1.0)
Beta: 0.8 g/dL (ref 0.7–1.3)
GLOBULIN, TOTAL: 2.4 g/dL (ref 2.2–3.9)
Gamma Globulin: 0.8 g/dL (ref 0.4–1.8)
Total Protein: 5.6 g/dL — ABNORMAL LOW (ref 6.0–8.5)

## 2017-01-03 LAB — IGG, IGA, IGM
IGG (IMMUNOGLOBIN G), SERUM: 698 mg/dL — AB (ref 700–1600)
IgA, Qn, Serum: 185 mg/dL (ref 61–437)
IgM, Qn, Serum: 23 mg/dL (ref 15–143)

## 2017-01-03 LAB — KAPPA/LAMBDA LIGHT CHAINS
Ig Kappa Free Light Chain: 159.6 mg/L — ABNORMAL HIGH (ref 3.3–19.4)
Ig Lambda Free Light Chain: 16.1 mg/L (ref 5.7–26.3)
Kappa/Lambda FluidC Ratio: 9.91 — ABNORMAL HIGH (ref 0.26–1.65)

## 2017-01-17 ENCOUNTER — Other Ambulatory Visit: Payer: Self-pay | Admitting: *Deleted

## 2017-01-17 DIAGNOSIS — S91332A Puncture wound without foreign body, left foot, initial encounter: Secondary | ICD-10-CM

## 2017-01-17 DIAGNOSIS — C9000 Multiple myeloma not having achieved remission: Secondary | ICD-10-CM

## 2017-01-17 MED ORDER — POMALIDOMIDE 4 MG PO CAPS: 4.0000 mg | ORAL_CAPSULE | Freq: Every day | ORAL | 0 refills | Status: DC

## 2017-01-24 DIAGNOSIS — Z961 Presence of intraocular lens: Secondary | ICD-10-CM | POA: Diagnosis not present

## 2017-01-24 DIAGNOSIS — E103211 Type 1 diabetes mellitus with mild nonproliferative diabetic retinopathy with macular edema, right eye: Secondary | ICD-10-CM | POA: Diagnosis not present

## 2017-01-24 DIAGNOSIS — H35371 Puckering of macula, right eye: Secondary | ICD-10-CM | POA: Diagnosis not present

## 2017-01-24 DIAGNOSIS — Z794 Long term (current) use of insulin: Secondary | ICD-10-CM | POA: Diagnosis not present

## 2017-01-24 DIAGNOSIS — H35341 Macular cyst, hole, or pseudohole, right eye: Secondary | ICD-10-CM | POA: Diagnosis not present

## 2017-02-13 DIAGNOSIS — E109 Type 1 diabetes mellitus without complications: Secondary | ICD-10-CM | POA: Diagnosis not present

## 2017-02-13 DIAGNOSIS — Z23 Encounter for immunization: Secondary | ICD-10-CM | POA: Diagnosis not present

## 2017-02-13 DIAGNOSIS — Z6828 Body mass index (BMI) 28.0-28.9, adult: Secondary | ICD-10-CM | POA: Diagnosis not present

## 2017-02-13 DIAGNOSIS — C9000 Multiple myeloma not having achieved remission: Secondary | ICD-10-CM | POA: Diagnosis not present

## 2017-02-13 DIAGNOSIS — I10 Essential (primary) hypertension: Secondary | ICD-10-CM | POA: Diagnosis not present

## 2017-02-17 ENCOUNTER — Other Ambulatory Visit: Payer: Self-pay | Admitting: *Deleted

## 2017-02-17 DIAGNOSIS — C9000 Multiple myeloma not having achieved remission: Secondary | ICD-10-CM

## 2017-02-17 DIAGNOSIS — S91332A Puncture wound without foreign body, left foot, initial encounter: Secondary | ICD-10-CM

## 2017-02-17 MED ORDER — POMALIDOMIDE 4 MG PO CAPS: 4.0000 mg | ORAL_CAPSULE | Freq: Every day | ORAL | 0 refills | Status: DC

## 2017-03-20 ENCOUNTER — Other Ambulatory Visit: Payer: Self-pay | Admitting: *Deleted

## 2017-03-20 DIAGNOSIS — S91332A Puncture wound without foreign body, left foot, initial encounter: Secondary | ICD-10-CM

## 2017-03-20 DIAGNOSIS — C9000 Multiple myeloma not having achieved remission: Secondary | ICD-10-CM

## 2017-03-20 MED ORDER — POMALIDOMIDE 4 MG PO CAPS: 4.0000 mg | ORAL_CAPSULE | Freq: Every day | ORAL | 0 refills | Status: DC

## 2017-04-09 DIAGNOSIS — L538 Other specified erythematous conditions: Secondary | ICD-10-CM | POA: Diagnosis not present

## 2017-04-09 DIAGNOSIS — D1801 Hemangioma of skin and subcutaneous tissue: Secondary | ICD-10-CM | POA: Diagnosis not present

## 2017-04-09 DIAGNOSIS — C44329 Squamous cell carcinoma of skin of other parts of face: Secondary | ICD-10-CM | POA: Diagnosis not present

## 2017-04-09 DIAGNOSIS — L821 Other seborrheic keratosis: Secondary | ICD-10-CM | POA: Diagnosis not present

## 2017-04-09 DIAGNOSIS — Z08 Encounter for follow-up examination after completed treatment for malignant neoplasm: Secondary | ICD-10-CM | POA: Diagnosis not present

## 2017-04-09 DIAGNOSIS — L570 Actinic keratosis: Secondary | ICD-10-CM | POA: Diagnosis not present

## 2017-04-09 DIAGNOSIS — L820 Inflamed seborrheic keratosis: Secondary | ICD-10-CM | POA: Diagnosis not present

## 2017-04-09 DIAGNOSIS — Z85828 Personal history of other malignant neoplasm of skin: Secondary | ICD-10-CM | POA: Diagnosis not present

## 2017-04-15 ENCOUNTER — Other Ambulatory Visit: Payer: Self-pay | Admitting: *Deleted

## 2017-04-15 DIAGNOSIS — C9000 Multiple myeloma not having achieved remission: Secondary | ICD-10-CM

## 2017-04-15 DIAGNOSIS — S91332A Puncture wound without foreign body, left foot, initial encounter: Secondary | ICD-10-CM

## 2017-04-15 MED ORDER — POMALIDOMIDE 4 MG PO CAPS: 4.0000 mg | ORAL_CAPSULE | Freq: Every day | ORAL | 0 refills | Status: DC

## 2017-04-23 ENCOUNTER — Ambulatory Visit (HOSPITAL_BASED_OUTPATIENT_CLINIC_OR_DEPARTMENT_OTHER): Payer: Medicare Other | Admitting: Hematology & Oncology

## 2017-04-23 ENCOUNTER — Encounter: Payer: Self-pay | Admitting: Hematology & Oncology

## 2017-04-23 ENCOUNTER — Other Ambulatory Visit (HOSPITAL_BASED_OUTPATIENT_CLINIC_OR_DEPARTMENT_OTHER): Payer: Medicare Other

## 2017-04-23 ENCOUNTER — Other Ambulatory Visit: Payer: Self-pay

## 2017-04-23 VITALS — BP 120/62 | HR 61 | Temp 97.4°F | Resp 16 | Wt 208.0 lb

## 2017-04-23 DIAGNOSIS — C9001 Multiple myeloma in remission: Secondary | ICD-10-CM

## 2017-04-23 DIAGNOSIS — C9000 Multiple myeloma not having achieved remission: Secondary | ICD-10-CM | POA: Diagnosis not present

## 2017-04-23 DIAGNOSIS — Z9484 Stem cells transplant status: Secondary | ICD-10-CM | POA: Diagnosis not present

## 2017-04-23 LAB — CMP (CANCER CENTER ONLY)
ALK PHOS: 64 U/L (ref 26–84)
ALT: 22 U/L (ref 10–47)
AST: 18 U/L (ref 11–38)
Albumin: 3.3 g/dL (ref 3.3–5.5)
BILIRUBIN TOTAL: 0.9 mg/dL (ref 0.20–1.60)
BUN: 20 mg/dL (ref 7–22)
CO2: 28 mEq/L (ref 18–33)
Calcium: 9.3 mg/dL (ref 8.0–10.3)
Chloride: 105 mEq/L (ref 98–108)
Creat: 1.2 mg/dl (ref 0.6–1.2)
GLUCOSE: 233 mg/dL — AB (ref 73–118)
Potassium: 4 mEq/L (ref 3.3–4.7)
Sodium: 142 mEq/L (ref 128–145)
Total Protein: 6.5 g/dL (ref 6.4–8.1)

## 2017-04-23 LAB — CBC WITH DIFFERENTIAL (CANCER CENTER ONLY)
BASO#: 0.1 10*3/uL (ref 0.0–0.2)
BASO%: 1.2 % (ref 0.0–2.0)
EOS ABS: 0.3 10*3/uL (ref 0.0–0.5)
EOS%: 6.7 % (ref 0.0–7.0)
HEMATOCRIT: 45.7 % (ref 38.7–49.9)
HGB: 15.8 g/dL (ref 13.0–17.1)
LYMPH#: 1.3 10*3/uL (ref 0.9–3.3)
LYMPH%: 31.8 % (ref 14.0–48.0)
MCH: 35.6 pg — ABNORMAL HIGH (ref 28.0–33.4)
MCHC: 34.6 g/dL (ref 32.0–35.9)
MCV: 103 fL — ABNORMAL HIGH (ref 82–98)
MONO#: 0.7 10*3/uL (ref 0.1–0.9)
MONO%: 17.3 % — ABNORMAL HIGH (ref 0.0–13.0)
NEUT%: 43 % (ref 40.0–80.0)
NEUTROS ABS: 1.8 10*3/uL (ref 1.5–6.5)
PLATELETS: 139 10*3/uL — AB (ref 145–400)
RBC: 4.44 10*6/uL (ref 4.20–5.70)
RDW: 13.8 % (ref 11.1–15.7)
WBC: 4.2 10*3/uL (ref 4.0–10.0)
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
## 2017-04-24 LAB — IGG, IGA, IGM
IGM (IMMUNOGLOBIN M), SRM: 26 mg/dL (ref 15–143)
IgA, Qn, Serum: 191 mg/dL (ref 61–437)
IgG, Qn, Serum: 757 mg/dL (ref 700–1600)

## 2017-04-24 LAB — KAPPA/LAMBDA LIGHT CHAINS
IG KAPPA FREE LIGHT CHAIN: 177.1 mg/L — AB (ref 3.3–19.4)
IG LAMBDA FREE LIGHT CHAIN: 15.6 mg/L (ref 5.7–26.3)
KAPPA/LAMBDA FLC RATIO: 11.35 — AB (ref 0.26–1.65)

## 2017-04-25 LAB — PROTEIN ELECTROPHORESIS, SERUM, WITH REFLEX
A/G RATIO SPE: 1.3 (ref 0.7–1.7)
ALBUMIN: 3.3 g/dL (ref 2.9–4.4)
ALPHA 2: 0.7 g/dL (ref 0.4–1.0)
Alpha 1: 0.2 g/dL (ref 0.0–0.4)
Beta: 0.8 g/dL (ref 0.7–1.3)
GLOBULIN, TOTAL: 2.6 g/dL (ref 2.2–3.9)
Gamma Globulin: 0.9 g/dL (ref 0.4–1.8)
Total Protein: 5.9 g/dL — ABNORMAL LOW (ref 6.0–8.5)

## 2017-05-08 ENCOUNTER — Other Ambulatory Visit: Payer: Self-pay | Admitting: *Deleted

## 2017-05-08 MED ORDER — IXAZOMIB CITRATE 4 MG PO CAPS: ORAL_CAPSULE | ORAL | 6 refills | Status: DC

## 2017-05-08 NOTE — Addendum Note (Signed)
Addended by: [name] on: 05/08/2017 01:56 PM   Modules accepted: Orders

## 2017-05-09 ENCOUNTER — Telehealth: Payer: Self-pay | Admitting: Pharmacist

## 2017-05-09 MED FILL — NINLARO 4 MG CAP: 4 | 28 days supply | Qty: 3 | Fill #0

## 2017-05-09 NOTE — Telephone Encounter (Signed)
Oral Oncology Patient Advocate Encounter  Received notification from BCBS/Medicare that prior authorization for ninlaro is required.  PA submitted on CoverMyMeds Key [id number] Status is pending  Oral Oncology Clinic will continue to follow.    [place] Patient Advocate [phone] 05/09/2017 11:47 AM

## 2017-05-09 NOTE — Telephone Encounter (Signed)
Oral Chemotherapy Pharmacist Encounter  Patient Education I spoke with patient for overview of new oral chemotherapy medication: Ninlaro (ixazomib) for the treatment of multiple myeloma, planned duration until disease progression or unacceptable drug toxicity.   Pt is doing well. Counseled patient on administration, dosing, side effects, monitoring, drug-food interactions, safe handling, storage, and disposal. Patient will take 1 capsule weekly for 3 weeks and 1 week off. Take on an empty stomach 1hr before or 2hrs after food.   Rickey [name] started his pomalidamide cycle on 05/06/17, to make sure his [name] is in sych with his pomalidamide Rickey [name] will only take 2 doses of his Ninlaro this cycle. Rickey [name] will start his Ninlaro on 05/13/17 Day 8 of his cycle.   Side effects include but not limited to: rash, blurred vision, N/V/D.    Reviewed with patient importance of keeping a medication schedule and plan for any missed doses.  Rickey [name] voiced understanding and appreciation.   All questions answered.  Provided patient with [facility] phone number. Patient knows to call the office with questions or concerns. Oral Chemotherapy Navigation Clinic will continue to follow.  Darl Pikes, PharmD, BCPS Hematology/Oncology Clinical Pharmacist [facility] [phone]  05/09/2017 4:39 PM

## 2017-05-09 NOTE — Telephone Encounter (Signed)
Oral Oncology Patient Advocate Encounter  Sent e-mail to call center to please mail out [facility] and gave them his cc #. Updated patients [place] address in RX[employer] and [employer].  Medication to be ordered and they should get it Tuesday.    Rickey [name] Patient Advocate [phone] 05/09/2017 5:02 PM

## 2017-05-09 NOTE — Telephone Encounter (Signed)
Oral Oncology Pharmacist Encounter  Received new prescription for Ninlaro (ixazomib) for the treatment of multiple myeloma in conjunction with pomalidomide, planned duration until disease progression or unacceptable drug toxicity.  CBC/CMP from 04/23/17 assessed, no relevant lab abnormalities. Prescription dose and frequency assessed.   Current medication list in Epic reviewed, no DDIs with Ninlaro (ixazomib) identified.  Prescription has been e-scribed to the [facility] for benefits analysis and approval.  Oral Oncology Clinic will continue to follow for insurance authorization, copayment issues, initial counseling and start date.  Darl Pikes, PharmD, BCPS Hematology/Oncology Clinical Pharmacist [facility] [phone]  05/09/2017 11:18 AM

## 2017-05-09 NOTE — Telephone Encounter (Signed)
Oral Oncology Patient Advocate Encounter  Prior Authorization for Rickey [name] has been approved.    Effective dates: 05/09/2017 through 05/09/2018  Oral Oncology Clinic will continue to follow.   Co-pay is $501.08     [place] Patient Advocate [phone] 05/09/2017 5:02 PM

## 2017-05-13 NOTE — Telephone Encounter (Signed)
Oral Oncology Patient Advocate Encounter  [name] was mailed out 05/12/2017 to patient from [facility].   [place] Patient Advocate [phone] 05/13/2017 10:28 AM

## 2017-05-15 ENCOUNTER — Other Ambulatory Visit: Payer: Self-pay | Admitting: *Deleted

## 2017-05-15 DIAGNOSIS — S91332A Puncture wound without foreign body, left foot, initial encounter: Secondary | ICD-10-CM

## 2017-05-15 DIAGNOSIS — C9000 Multiple myeloma not having achieved remission: Secondary | ICD-10-CM

## 2017-05-15 MED ORDER — POMALIDOMIDE 4 MG PO CAPS: 4.0000 mg | ORAL_CAPSULE | Freq: Every day | ORAL | 0 refills | Status: DC

## 2017-05-21 ENCOUNTER — Other Ambulatory Visit: Payer: Self-pay | Admitting: *Deleted

## 2017-05-21 MED ORDER — IXAZOMIB CITRATE 4 MG PO CAPS: ORAL_CAPSULE | ORAL | 6 refills | Status: DC

## 2017-05-26 ENCOUNTER — Other Ambulatory Visit: Payer: Self-pay | Admitting: *Deleted

## 2017-05-26 MED ORDER — IXAZOMIB CITRATE 4 MG PO CAPS: ORAL_CAPSULE | ORAL | 6 refills | Status: DC

## 2017-06-02 ENCOUNTER — Telehealth: Payer: Self-pay | Admitting: Pharmacist

## 2017-06-02 DIAGNOSIS — C9000 Multiple myeloma not having achieved remission: Secondary | ICD-10-CM

## 2017-06-02 MED ORDER — IXAZOMIB CITRATE 4 MG PO CAPS: ORAL_CAPSULE | ORAL | 6 refills | Status: DC

## 2017-06-02 MED FILL — NINLARO 4 MG CAP: 4 | 28 days supply | Qty: 3 | Fill #0

## 2017-06-02 NOTE — Telephone Encounter (Signed)
Oral Chemotherapy Pharmacist Encounter  Per Roselyn Reef, RN the [facility] would like for Rickey [name] perscription to be transferred back to [facility] from [facility]. Spoke with Rickey [name] and they were told by [facility] that they could not filling the Ninlaro and it needed to be filled at [facility] [facility].    We were able send in a new prescription and have it filled at [facility] for Rickey [name], PharmD, BCPS Hematology/Oncology Clinical Pharmacist [facility] [phone]  06/02/2017 2:56 PM

## 2017-06-03 NOTE — Telephone Encounter (Signed)
Oral Chemotherapy Pharmacist Encounter  Called the [facility] to provide them with a tracking number for his Ninlaro.  UPS tracking: [phone]    Medication should be delivered by the end of today.  Darl Pikes, PharmD, BCPS Hematology/Oncology Clinical Pharmacist [facility] [phone]  06/03/2017 9:33 AM

## 2017-06-10 ENCOUNTER — Other Ambulatory Visit: Payer: Self-pay | Admitting: *Deleted

## 2017-06-10 DIAGNOSIS — S91332A Puncture wound without foreign body, left foot, initial encounter: Secondary | ICD-10-CM

## 2017-06-10 DIAGNOSIS — C9000 Multiple myeloma not having achieved remission: Secondary | ICD-10-CM

## 2017-06-10 MED ORDER — POMALIDOMIDE 4 MG PO CAPS: 4.0000 mg | ORAL_CAPSULE | Freq: Every day | ORAL | 0 refills | Status: DC

## 2017-06-11 ENCOUNTER — Other Ambulatory Visit: Payer: Self-pay | Admitting: *Deleted

## 2017-06-11 DIAGNOSIS — C9000 Multiple myeloma not having achieved remission: Secondary | ICD-10-CM

## 2017-06-11 DIAGNOSIS — S91332A Puncture wound without foreign body, left foot, initial encounter: Secondary | ICD-10-CM

## 2017-06-11 MED ORDER — POMALIDOMIDE 4 MG PO CAPS: 4.0000 mg | ORAL_CAPSULE | Freq: Every day | ORAL | 0 refills | Status: DC

## 2017-06-17 DIAGNOSIS — Z125 Encounter for screening for malignant neoplasm of prostate: Secondary | ICD-10-CM | POA: Diagnosis not present

## 2017-06-17 DIAGNOSIS — E7849 Other hyperlipidemia: Secondary | ICD-10-CM | POA: Diagnosis not present

## 2017-06-17 DIAGNOSIS — E291 Testicular hypofunction: Secondary | ICD-10-CM | POA: Diagnosis not present

## 2017-06-17 DIAGNOSIS — E109 Type 1 diabetes mellitus without complications: Secondary | ICD-10-CM | POA: Diagnosis not present

## 2017-06-17 DIAGNOSIS — I10 Essential (primary) hypertension: Secondary | ICD-10-CM | POA: Diagnosis not present

## 2017-06-17 DIAGNOSIS — R82998 Other abnormal findings in urine: Secondary | ICD-10-CM | POA: Diagnosis not present

## 2017-06-17 DIAGNOSIS — E298 Other testicular dysfunction: Secondary | ICD-10-CM | POA: Diagnosis not present

## 2017-06-20 ENCOUNTER — Other Ambulatory Visit: Payer: Self-pay

## 2017-06-20 ENCOUNTER — Encounter: Payer: Self-pay | Admitting: Hematology & Oncology

## 2017-06-20 ENCOUNTER — Inpatient Hospital Stay: Payer: Medicare Other | Attending: Hematology & Oncology

## 2017-06-20 ENCOUNTER — Inpatient Hospital Stay (HOSPITAL_BASED_OUTPATIENT_CLINIC_OR_DEPARTMENT_OTHER): Payer: Medicare Other | Admitting: Hematology & Oncology

## 2017-06-20 VITALS — BP 122/64 | HR 58 | Temp 98.1°F | Resp 18 | Wt 212.0 lb

## 2017-06-20 DIAGNOSIS — Z9641 Presence of insulin pump (external) (internal): Secondary | ICD-10-CM | POA: Insufficient documentation

## 2017-06-20 DIAGNOSIS — C9000 Multiple myeloma not having achieved remission: Secondary | ICD-10-CM

## 2017-06-20 DIAGNOSIS — C9002 Multiple myeloma in relapse: Secondary | ICD-10-CM | POA: Insufficient documentation

## 2017-06-20 DIAGNOSIS — E109 Type 1 diabetes mellitus without complications: Secondary | ICD-10-CM | POA: Diagnosis not present

## 2017-06-20 DIAGNOSIS — N400 Enlarged prostate without lower urinary tract symptoms: Secondary | ICD-10-CM | POA: Diagnosis not present

## 2017-06-20 DIAGNOSIS — Z794 Long term (current) use of insulin: Secondary | ICD-10-CM

## 2017-06-20 DIAGNOSIS — Z6828 Body mass index (BMI) 28.0-28.9, adult: Secondary | ICD-10-CM | POA: Diagnosis not present

## 2017-06-20 DIAGNOSIS — E119 Type 2 diabetes mellitus without complications: Secondary | ICD-10-CM | POA: Diagnosis not present

## 2017-06-20 DIAGNOSIS — Z0000 Encounter for general adult medical examination without abnormal findings: Secondary | ICD-10-CM | POA: Diagnosis not present

## 2017-06-20 DIAGNOSIS — I4510 Unspecified right bundle-branch block: Secondary | ICD-10-CM | POA: Diagnosis not present

## 2017-06-20 DIAGNOSIS — Z1389 Encounter for screening for other disorder: Secondary | ICD-10-CM | POA: Diagnosis not present

## 2017-06-20 DIAGNOSIS — E298 Other testicular dysfunction: Secondary | ICD-10-CM | POA: Diagnosis not present

## 2017-06-20 DIAGNOSIS — E7849 Other hyperlipidemia: Secondary | ICD-10-CM | POA: Diagnosis not present

## 2017-06-20 DIAGNOSIS — I10 Essential (primary) hypertension: Secondary | ICD-10-CM | POA: Diagnosis not present

## 2017-06-20 LAB — CMP (CANCER CENTER ONLY)
ALT: 22 U/L (ref 0–55)
AST: 22 U/L (ref 5–34)
Albumin: 3.3 g/dL — ABNORMAL LOW (ref 3.5–5.0)
Alkaline Phosphatase: 54 U/L (ref 26–84)
Anion gap: 3 — ABNORMAL LOW (ref 5–15)
BUN: 14 mg/dL (ref 7–22)
CHLORIDE: 105 mmol/L (ref 98–108)
CO2: 29 mmol/L (ref 18–33)
Calcium: 9 mg/dL (ref 8.0–10.3)
Creatinine: 0.9 mg/dL (ref 0.70–1.30)
GLUCOSE: 175 mg/dL — AB (ref 70–118)
POTASSIUM: 4.3 mmol/L (ref 3.3–4.7)
SODIUM: 137 mmol/L (ref 128–145)
TOTAL PROTEIN: 6.3 g/dL — AB (ref 6.4–8.1)
Total Bilirubin: 1.2 mg/dL (ref 0.2–1.2)

## 2017-06-20 LAB — CBC WITH DIFFERENTIAL (CANCER CENTER ONLY)
BASOS PCT: 3 %
Basophils Absolute: 0.1 10*3/uL (ref 0.0–0.1)
EOS ABS: 0.2 10*3/uL (ref 0.0–0.5)
EOS PCT: 4 %
HCT: 45.4 % (ref 38.7–49.9)
Hemoglobin: 15.5 g/dL (ref 13.0–17.1)
Lymphocytes Relative: 34 %
Lymphs Abs: 1.2 10*3/uL (ref 0.9–3.3)
MCH: 35.2 pg — ABNORMAL HIGH (ref 28.0–33.4)
MCHC: 34.1 g/dL (ref 32.0–35.9)
MCV: 103.2 fL — ABNORMAL HIGH (ref 82.0–98.0)
Monocytes Absolute: 0.9 10*3/uL (ref 0.1–0.9)
Monocytes Relative: 24 %
Neutro Abs: 1.3 10*3/uL — ABNORMAL LOW (ref 1.5–6.5)
Neutrophils Relative %: 35 %
PLATELETS: 138 10*3/uL — AB (ref 140–400)
RBC: 4.4 MIL/uL (ref 4.20–5.70)
RDW: 13.8 % (ref 11.1–15.7)
WBC: 3.6 10*3/uL — AB (ref 4.0–10.3)

## 2017-06-20 NOTE — Progress Notes (Signed)
Hematology and Oncology Follow Up Visit  Rickey [name] [id number] 1941-05-24 77 y.o. 06/20/2017   Principle Diagnosis:   Kappa light chain myeloma-clinical relapse  Traumatic fracture of left elbow - status post surgical repair in February 2018  Current Therapy:   Ninlaro/Pomalyst - s/p cycle #1     Interim History:  Mr.  Fjelstad is back for followup.  We have started him on Ninlaro with the Pomalidomide.  His Kappa Lightchain was going up.  When we saw him back in November, his kappa light chain was 17.7 mg/dL.  Prior to this, it was 16 mg/dL.  The trend clearly was on the way up.  He is done well with the Ninlaro/Pomalidomide.  He has had no problems with nausea or vomiting.  He has had no rashes.  Is had no diarrhea or constipation.  He is diabetic.  He is on an insulin pump.  His blood sugars are being monitored very closely.  There is been no bleeding.  He has had no fever.  Is had no leg swelling.  He has had no rashes.  Overall, his performance status is ECOG 1  Medications:  Current Outpatient Medications:  .  acyclovir (ZOVIRAX) 400 MG tablet, TAKE 1 TABLET(400 MG) BY MOUTH TWICE DAILY. 90 day supply, Disp: 180 tablet, Rfl: 4 .  amLODipine (NORVASC) 2.5 MG tablet, Take 2.5 mg by mouth daily. , Disp: , Rfl:  .  aspirin 81 MG tablet, Take 81 mg by mouth daily.  , Disp: , Rfl:  .  atorvastatin (LIPITOR) 10 MG tablet, Take 10 mg by mouth daily.  , Disp: , Rfl:  .  calcium-vitamin D (OSCAL) 250-125 MG-UNIT per tablet, Take 1 tablet by mouth daily. , Disp: , Rfl:  .  Cholecalciferol (VITAMIN D) 2000 UNITS CAPS, Take by mouth every morning., Disp: , Rfl:  .  cycloSPORINE (RESTASIS) 0.05 % ophthalmic emulsion, 1 drop 2 (two) times daily.  , Disp: , Rfl:  .  fish oil-omega-3 fatty acids 1000 MG capsule, Take 2 g by mouth 2 (two) times daily. , Disp: , Rfl:  .  insulin lispro (HUMALOG) 100 UNIT/ML injection, Inject into the skin. Insulin pump, Disp: , Rfl:  .  Insulin Lispro  Prot & Lispro (INSULIN LISP & LISP PROT, HUM, [place]), , Disp: , Rfl:  .  ixazomib citrate (NINLARO) 4 MG capsule, Take 1 capsule weekly for 3 weeks and 1 week off.Take on an empty stomach 1hr before or 2hrs after food. Do not crush, chew or open., Disp: 3 capsule, Rfl: 6 .  montelukast (SINGULAIR) 10 MG tablet, Take 10 mg by mouth at bedtime.  , Disp: , Rfl:  .  pomalidomide (POMALYST) 4 MG capsule, Take 1 capsule (4 mg total) by mouth daily. Take with water on days 1-21. Repeat every 28 days. [id number], Disp: 21 capsule, Rfl: 0 .  rifaximin (XIFAXAN) 200 MG tablet, one tablet three times a day for three days for traveler's diarrhea, Disp: , Rfl:  .  telmisartan-hydrochlorothiazide (MICARDIS HCT) 80-12.5 MG per tablet, Take 1 tablet by mouth daily. , Disp: , Rfl:  .  zolpidem (AMBIEN) 10 MG tablet, , Disp: , Rfl:   Allergies: No Known Allergies  Past Medical History, Surgical history, Social history, and Family History were reviewed and updated.  Review of Systems: Review of Systems  Constitutional: Negative.   HENT: Negative.   Eyes: Negative.   Respiratory: Negative.   Cardiovascular: Negative.   Gastrointestinal: Negative.   Genitourinary: Negative.  Musculoskeletal: Negative.   Skin: Negative.   Neurological: Negative.   Endo/Heme/Allergies: Negative.   Psychiatric/Behavioral: Negative.      Physical Exam:  weight is 212 lb (96.2 kg). His oral temperature is 98.1 F (36.7 C). His blood pressure is 122/64 and his pulse is 58 (abnormal). His respiration is 18 and oxygen saturation is 98%.   Physical Exam  Constitutional: He is oriented to person, place, and time.  HENT:  Head: Normocephalic and atraumatic.  Mouth/Throat: Oropharynx is clear and moist.  Eyes: EOM are normal. Pupils are equal, round, and reactive to light.  Neck: Normal range of motion.  Cardiovascular: Normal rate, regular rhythm and normal heart sounds.  Pulmonary/Chest: Effort normal and breath sounds  normal.  Abdominal: Soft. Bowel sounds are normal.  Musculoskeletal: Normal range of motion. He exhibits no edema, tenderness or deformity.  Lymphadenopathy:    He has no cervical adenopathy.  Neurological: He is alert and oriented to person, place, and time.  Skin: Skin is warm and dry. No rash noted. No erythema.  Psychiatric: He has a normal mood and affect. His behavior is normal. Judgment and thought content normal.  Vitals reviewed.   Lab Results  Component Value Date   WBC 3.6 (L) 06/20/2017   HGB 15.8 04/23/2017   HCT 45.4 06/20/2017   MCV 103.2 (H) 06/20/2017   PLT 138 (L) 06/20/2017     Chemistry      Component Value Date/Time   NA 137 06/20/2017 1022   NA 142 04/23/2017 1431   NA 139 07/18/2016 1135   K 4.3 06/20/2017 1022   K 4.0 04/23/2017 1431   K 4.4 07/18/2016 1135   CL 105 06/20/2017 1022   CL 105 04/23/2017 1431   CO2 29 06/20/2017 1022   CO2 28 04/23/2017 1431   CO2 27 07/18/2016 1135   BUN 14 06/20/2017 1022   BUN 20 04/23/2017 1431   BUN 12.2 07/18/2016 1135   CREATININE 1.2 04/23/2017 1431   CREATININE 0.9 07/18/2016 1135      Component Value Date/Time   CALCIUM 9.0 06/20/2017 1022   CALCIUM 9.3 04/23/2017 1431   CALCIUM 8.9 07/18/2016 1135   ALKPHOS 54 06/20/2017 1022   ALKPHOS 64 04/23/2017 1431   ALKPHOS 58 07/18/2016 1135   AST 22 06/20/2017 1022   AST 15 07/18/2016 1135   ALT 22 06/20/2017 1022   ALT 22 04/23/2017 1431   ALT 20 07/18/2016 1135   BILITOT 1.2 06/20/2017 1022   BILITOT 1.24 (H) 07/18/2016 1135       Impression and Plan: Mr. Banke is a 77 year old gentleman with kappa light chain myeloma. He got into a remission currently quickly. We then got him to transplant.  It has been about 9 years since transplant.   I am not sure that we will see much of a change in his light chains yet.  He really has had only 2 cycles of the Ninlaro.  We will get him back in 4 weeks.  I think we should have a much better idea as to how  things are going we see him back and see what his light chains are.  He and his wife are going back to their beach house.  [place]  Thankfully, there family all lives in the [place].    [name], MD 1/25/20194:54 PM

## 2017-06-21 LAB — IGG, IGA, IGM
IgA: 199 mg/dL (ref 61–437)
IgG (Immunoglobin G), Serum: 824 mg/dL (ref 700–1600)
IgM (Immunoglobulin M), Srm: 20 mg/dL (ref 15–143)

## 2017-06-23 LAB — KAPPA/LAMBDA LIGHT CHAINS
Kappa free light chain: 207.7 mg/L — ABNORMAL HIGH (ref 3.3–19.4)
Kappa, lambda light chain ratio: 18.71 — ABNORMAL HIGH (ref 0.26–1.65)
LAMDA FREE LIGHT CHAINS: 11.1 mg/L (ref 5.7–26.3)

## 2017-06-24 DIAGNOSIS — Z1212 Encounter for screening for malignant neoplasm of rectum: Secondary | ICD-10-CM | POA: Diagnosis not present

## 2017-06-24 LAB — PROTEIN ELECTROPHORESIS, SERUM, WITH REFLEX
A/G Ratio: 1.4 (ref 0.7–1.7)
ALBUMIN ELP: 3.5 g/dL (ref 2.9–4.4)
ALPHA-1-GLOBULIN: 0.2 g/dL (ref 0.0–0.4)
ALPHA-2-GLOBULIN: 0.6 g/dL (ref 0.4–1.0)
BETA GLOBULIN: 0.9 g/dL (ref 0.7–1.3)
GAMMA GLOBULIN: 0.8 g/dL (ref 0.4–1.8)
Globulin, Total: 2.5 g/dL (ref 2.2–3.9)
TOTAL PROTEIN ELP: 6 g/dL (ref 6.0–8.5)

## 2017-06-25 ENCOUNTER — Telehealth: Payer: Self-pay | Admitting: *Deleted

## 2017-06-25 NOTE — Telephone Encounter (Addendum)
Patient is aware of results  ----- Message from [name], MD sent at 06/24/2017  5:27 PM EST ----- Call - the light chains are still up a little bit, but he really has not been on ninlaro all that long.  The next time we check the light chains will be the key determinant as to whether the ninlaro is helping!!  Rickey [name]

## 2017-06-26 MED FILL — NINLARO 4 MG CAP: 4 | 28 days supply | Qty: 3 | Fill #1

## 2017-06-27 ENCOUNTER — Telehealth: Payer: Self-pay | Admitting: *Deleted

## 2017-06-27 MED ORDER — CEFDINIR 300 MG PO CAPS: 300.0000 mg | ORAL_CAPSULE | Freq: Two times a day (BID) | ORAL | 0 refills | Status: DC

## 2017-06-27 NOTE — Telephone Encounter (Signed)
Patient is c/o upper respiratory infection with productive cough (clear mucous), chills, sore throat and low grade fevers of 99.8.   Reviewed symptoms with Dr Marin Olp. He would like patient to start an antibiotic.   Patient is aware of new prescription and pharmacy confirmed.

## 2017-07-12 DIAGNOSIS — J101 Influenza due to other identified influenza virus with other respiratory manifestations: Secondary | ICD-10-CM | POA: Diagnosis not present

## 2017-07-14 ENCOUNTER — Other Ambulatory Visit: Payer: Self-pay | Admitting: *Deleted

## 2017-07-14 DIAGNOSIS — C9000 Multiple myeloma not having achieved remission: Secondary | ICD-10-CM

## 2017-07-14 DIAGNOSIS — S91332A Puncture wound without foreign body, left foot, initial encounter: Secondary | ICD-10-CM

## 2017-07-14 MED ORDER — POMALIDOMIDE 4 MG PO CAPS: 4.0000 mg | ORAL_CAPSULE | Freq: Every day | ORAL | 0 refills | Status: DC

## 2017-07-16 ENCOUNTER — Other Ambulatory Visit: Payer: Self-pay | Admitting: Pharmacist

## 2017-07-16 DIAGNOSIS — C9000 Multiple myeloma not having achieved remission: Secondary | ICD-10-CM

## 2017-07-16 MED ORDER — IXAZOMIB CITRATE 4 MG PO CAPS: ORAL_CAPSULE | ORAL | 4 refills | Status: DC

## 2017-07-18 ENCOUNTER — Other Ambulatory Visit: Payer: Medicare Other

## 2017-07-18 ENCOUNTER — Ambulatory Visit: Payer: Medicare Other | Admitting: Hematology & Oncology

## 2017-07-18 DIAGNOSIS — C9000 Multiple myeloma not having achieved remission: Secondary | ICD-10-CM | POA: Diagnosis not present

## 2017-07-22 ENCOUNTER — Other Ambulatory Visit: Payer: Self-pay | Admitting: *Deleted

## 2017-07-22 DIAGNOSIS — C9000 Multiple myeloma not having achieved remission: Secondary | ICD-10-CM

## 2017-07-22 MED ORDER — IXAZOMIB CITRATE 4 MG PO CAPS: ORAL_CAPSULE | ORAL | 4 refills | Status: DC

## 2017-07-23 ENCOUNTER — Other Ambulatory Visit: Payer: Self-pay | Admitting: *Deleted

## 2017-07-23 DIAGNOSIS — C9000 Multiple myeloma not having achieved remission: Secondary | ICD-10-CM

## 2017-07-23 MED ORDER — ACYCLOVIR 400 MG PO TABS: ORAL_TABLET | ORAL | 4 refills | Status: DC

## 2017-07-23 MED FILL — NINLARO 4 MG CAP: 4 | 28 days supply | Qty: 3 | Fill #0

## 2017-07-24 ENCOUNTER — Other Ambulatory Visit: Payer: Medicare Other

## 2017-07-24 ENCOUNTER — Ambulatory Visit: Payer: Medicare Other | Admitting: Hematology & Oncology

## 2017-07-25 ENCOUNTER — Other Ambulatory Visit: Payer: Medicare Other

## 2017-07-25 ENCOUNTER — Ambulatory Visit: Payer: Medicare Other | Admitting: Hematology & Oncology

## 2017-07-29 ENCOUNTER — Other Ambulatory Visit: Payer: Self-pay | Admitting: *Deleted

## 2017-07-29 DIAGNOSIS — C9000 Multiple myeloma not having achieved remission: Secondary | ICD-10-CM

## 2017-07-29 DIAGNOSIS — S91332A Puncture wound without foreign body, left foot, initial encounter: Secondary | ICD-10-CM

## 2017-07-29 MED ORDER — IXAZOMIB CITRATE 4 MG PO CAPS: ORAL_CAPSULE | ORAL | 4 refills | Status: DC

## 2017-07-29 MED ORDER — POMALIDOMIDE 4 MG PO CAPS: 4.0000 mg | ORAL_CAPSULE | Freq: Every day | ORAL | 0 refills | Status: DC

## 2017-08-04 ENCOUNTER — Telehealth: Payer: Self-pay | Admitting: Hematology & Oncology

## 2017-08-04 NOTE — Telephone Encounter (Signed)
Oral Oncology Patient Advocate Encounter   Called patient to see where he would like his medication filled, we was filling one thru [place]. He has decided to fill both thru [place] Patient Advocate [phone] 08/04/2017 12:03 PM

## 2017-08-15 ENCOUNTER — Inpatient Hospital Stay (HOSPITAL_BASED_OUTPATIENT_CLINIC_OR_DEPARTMENT_OTHER): Payer: Medicare Other | Admitting: Family

## 2017-08-15 ENCOUNTER — Inpatient Hospital Stay: Payer: Medicare Other | Attending: Hematology & Oncology

## 2017-08-15 ENCOUNTER — Other Ambulatory Visit: Payer: Self-pay

## 2017-08-15 ENCOUNTER — Encounter: Payer: Self-pay | Admitting: Family

## 2017-08-15 VITALS — BP 129/57 | HR 63 | Temp 97.5°F | Resp 16 | Wt 209.0 lb

## 2017-08-15 DIAGNOSIS — C9002 Multiple myeloma in relapse: Secondary | ICD-10-CM | POA: Insufficient documentation

## 2017-08-15 DIAGNOSIS — C9000 Multiple myeloma not having achieved remission: Secondary | ICD-10-CM

## 2017-08-15 LAB — CMP (CANCER CENTER ONLY)
ALBUMIN: 3.3 g/dL — AB (ref 3.5–5.0)
ALT: 29 U/L (ref 10–47)
AST: 23 U/L (ref 11–38)
Alkaline Phosphatase: 49 U/L (ref 26–84)
Anion gap: 5 (ref 5–15)
BUN: 16 mg/dL (ref 7–22)
CHLORIDE: 105 mmol/L (ref 98–108)
CO2: 31 mmol/L (ref 18–33)
CREATININE: 1 mg/dL (ref 0.60–1.20)
Calcium: 8.9 mg/dL (ref 8.0–10.3)
Glucose, Bld: 159 mg/dL — ABNORMAL HIGH (ref 73–118)
POTASSIUM: 4.2 mmol/L (ref 3.3–4.7)
SODIUM: 141 mmol/L (ref 128–145)
Total Bilirubin: 1 mg/dL (ref 0.2–1.6)
Total Protein: 6.1 g/dL — ABNORMAL LOW (ref 6.4–8.1)

## 2017-08-15 LAB — CBC WITH DIFFERENTIAL (CANCER CENTER ONLY)
BASOS PCT: 2 %
Basophils Absolute: 0.1 10*3/uL (ref 0.0–0.1)
EOS ABS: 0.1 10*3/uL (ref 0.0–0.5)
EOS PCT: 4 %
HCT: 44.9 % (ref 38.7–49.9)
Hemoglobin: 15.4 g/dL (ref 13.0–17.1)
Lymphocytes Relative: 29 %
Lymphs Abs: 1.1 10*3/uL (ref 0.9–3.3)
MCH: 35 pg — AB (ref 28.0–33.4)
MCHC: 34.3 g/dL (ref 32.0–35.9)
MCV: 102 fL — ABNORMAL HIGH (ref 82.0–98.0)
MONO ABS: 0.9 10*3/uL (ref 0.1–0.9)
MONOS PCT: 24 %
NEUTROS PCT: 41 %
Neutro Abs: 1.5 10*3/uL (ref 1.5–6.5)
PLATELETS: 126 10*3/uL — AB (ref 145–400)
RBC: 4.4 MIL/uL (ref 4.20–5.70)
RDW: 14 % (ref 11.1–15.7)
WBC Count: 3.7 10*3/uL — ABNORMAL LOW (ref 4.0–10.0)

## 2017-08-15 NOTE — Progress Notes (Signed)
Hematology and Oncology Follow Up Visit  Rickey [name] [id number] 23-Oct-1940 77 y.o. 08/15/2017   Principle Diagnosis:  Kappa light chain myeloma - clinical relapse Traumatic fracture of left elbow - status post surgical repair in February 2018  Current Therapy:   Ninlaro/Pomalyst - s/p cycle 2   Interim History:  Rickey [name] is here today with his wife for follow-up. He continues to do well and has no complaints at this time.  In January his kappa light chain was up to 20.77 mg/dL. He is tolerating the Ninlaro and Pomalyst well. Light chain results for today are pending.  He has had no issue with infection. No fever, chills, n/v, cough, rash, dizziness, SOB, chest pain, palpitations, abdominal pain or change in bowel or bladder habits. No episodes of bleeding, no bruising or petechiae. No lymphadenopathy found on exam.   No swelling, tenderness, numbness or tingling in his extremities. No c/o pain.  He has maintained a good appetite and is staying well hydrated. His weight is stable.   ECOG Performance Status: 1 - Symptomatic but completely ambulatory  Medications:  Allergies as of 08/15/2017   No Known Allergies     Medication List        Accurate as of 08/15/17  1:00 PM. Always use your most recent med list.          acyclovir 400 MG tablet Commonly known as:  ZOVIRAX TAKE 1 TABLET(400 MG) BY MOUTH TWICE DAILY. 90 day supply   amLODipine 2.5 MG tablet Commonly known as:  NORVASC Take 2.5 mg by mouth daily.   aspirin 81 MG tablet Take 81 mg by mouth daily.   atorvastatin 10 MG tablet Commonly known as:  LIPITOR Take 10 mg by mouth daily.   calcium-vitamin D 250-125 MG-UNIT tablet Commonly known as:  OSCAL Take 1 tablet by mouth daily.   cefdinir 300 MG capsule Commonly known as:  OMNICEF Take 1 capsule (300 mg total) by mouth 2 (two) times daily.   cycloSPORINE 0.05 % ophthalmic emulsion Commonly known as:  RESTASIS 1 drop 2 (two) times daily.   fish  oil-omega-3 fatty acids 1000 MG capsule Take 2 g by mouth 2 (two) times daily.   INSULIN LISP & LISP PROT (HUM) [place]   insulin lispro 100 UNIT/ML injection Commonly known as:  HUMALOG Inject into the skin. Insulin pump   irbesartan-hydrochlorothiazide 300-12.5 MG tablet Commonly known as:  AVALIDE   ixazomib citrate 4 MG capsule Commonly known as:  NINLARO Take 1 capsule weekly for 3 weeks and 1 week off.Take on an empty stomach 1hr before or 2hrs after food. Do not crush, chew or open.   montelukast 10 MG tablet Commonly known as:  SINGULAIR Take 10 mg by mouth at bedtime.   pomalidomide 4 MG capsule Commonly known as:  POMALYST Take 1 capsule (4 mg total) by mouth daily. Take with water on days 1-21. Repeat every 28 days. [id number]   telmisartan-hydrochlorothiazide 80-12.5 MG tablet Commonly known as:  MICARDIS HCT Take 1 tablet by mouth daily.   Vitamin D 2000 units Caps Take by mouth every morning.   XIFAXAN 200 MG tablet Generic drug:  rifaximin one tablet three times a day for three days for traveler's diarrhea   zolpidem 10 MG tablet Commonly known as:  AMBIEN       Allergies: No Known Allergies  Past Medical History, Surgical history, Social history, and Family History were reviewed and updated.  Review of Systems: All other 10 point  review of systems is negative.   Physical Exam:  weight is 209 lb (94.8 kg). His oral temperature is 97.5 F (36.4 C) (abnormal). His blood pressure is 129/57 (abnormal) and his pulse is 63. His respiration is 16 and oxygen saturation is 99%.   Wt Readings from Last 3 Encounters:  08/15/17 209 lb (94.8 kg)  06/20/17 212 lb (96.2 kg)  04/23/17 208 lb (94.3 kg)    Ocular: Sclerae unicteric, pupils equal, round and reactive to light Ear-nose-throat: Oropharynx clear, dentition fair Lymphatic: No cervical, supraclavicular or axillary adenopathy Lungs no rales or rhonchi, good excursion bilaterally Heart regular rate and  rhythm, no murmur appreciated Abd soft, nontender, positive bowel sounds, no liver or spleen tip palpated on exam, no fluid wave MSK no focal spinal tenderness, no joint edema Neuro: non-focal, well-oriented, appropriate affect Breasts: Deferred   Lab Results  Component Value Date   WBC 3.7 (L) 08/15/2017   HGB 15.8 04/23/2017   HCT 44.9 08/15/2017   MCV 102.0 (H) 08/15/2017   PLT 126 (L) 08/15/2017   No results found for: FERRITIN, IRON, TIBC, UIBC, IRONPCTSAT Lab Results  Component Value Date   RBC 4.40 08/15/2017   Lab Results  Component Value Date   KPAFRELGTCHN 207.7 (H) 06/20/2017   LAMBDASER 11.1 06/20/2017   KAPLAMBRATIO 18.71 (H) 06/20/2017   Lab Results  Component Value Date   IGGSERUM 824 06/20/2017   IGA 199 06/20/2017   IGMSERUM 20 06/20/2017   Lab Results  Component Value Date   TOTALPROTELP 6.0 06/20/2017   ALBUMINELP 3.5 06/20/2017   A1GS 0.2 06/20/2017   A2GS 0.6 06/20/2017   BETS 0.9 06/20/2017   BETA2SER 0.3 04/19/2015   GAMS 0.8 06/20/2017   MSPIKE Not Observed 06/20/2017   SPEI * 04/19/2015     Chemistry      Component Value Date/Time   NA 141 08/15/2017 1117   NA 142 04/23/2017 1431   NA 139 07/18/2016 1135   K 4.2 08/15/2017 1117   K 4.0 04/23/2017 1431   K 4.4 07/18/2016 1135   CL 105 08/15/2017 1117   CL 105 04/23/2017 1431   CO2 31 08/15/2017 1117   CO2 28 04/23/2017 1431   CO2 27 07/18/2016 1135   BUN 16 08/15/2017 1117   BUN 20 04/23/2017 1431   BUN 12.2 07/18/2016 1135   CREATININE 1.00 08/15/2017 1117   CREATININE 1.2 04/23/2017 1431   CREATININE 0.9 07/18/2016 1135      Component Value Date/Time   CALCIUM 8.9 08/15/2017 1117   CALCIUM 9.3 04/23/2017 1431   CALCIUM 8.9 07/18/2016 1135   ALKPHOS 49 08/15/2017 1117   ALKPHOS 64 04/23/2017 1431   ALKPHOS 58 07/18/2016 1135   AST 23 08/15/2017 1117   AST 15 07/18/2016 1135   ALT 29 08/15/2017 1117   ALT 22 04/23/2017 1431   ALT 20 07/18/2016 1135   BILITOT 1.0  08/15/2017 1117   BILITOT 1.24 (H) 07/18/2016 1135      Impression and Plan: Rickey [name] is a very pleasant 77 yo caucasian gentleman with 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 He has no relapsed and his is back on Ninlaro along with Pomalyst. He is tolerating this well and has no complaints at this time.  He would like to have his eyelids lifted. I spoke with Dr. Marin Olp and he feels this would be fine.  We will see what his myeloma studies show and determine is a change in therapy is  needed.  We will plan to see him back in another 2 months for follow-up and lab. He was also [place] labs [place]  They will contact our office with any questions or concerns. We can certainlty see him sooner if needed.   Laverna Peace, NP 3/22/20191:00 PM

## 2017-08-16 LAB — IGG, IGA, IGM
IGG (IMMUNOGLOBIN G), SERUM: 803 mg/dL (ref 700–1600)
IgA: 196 mg/dL (ref 61–437)
IgM (Immunoglobulin M), Srm: 18 mg/dL (ref 15–143)

## 2017-08-18 LAB — KAPPA/LAMBDA LIGHT CHAINS
KAPPA FREE LGHT CHN: 265.2 mg/L — AB (ref 3.3–19.4)
Kappa, lambda light chain ratio: 23.47 — ABNORMAL HIGH (ref 0.26–1.65)
LAMDA FREE LIGHT CHAINS: 11.3 mg/L (ref 5.7–26.3)

## 2017-08-19 LAB — PROTEIN ELECTROPHORESIS, SERUM, WITH REFLEX
A/G Ratio: 1.2 (ref 0.7–1.7)
ALPHA-1-GLOBULIN: 0.2 g/dL (ref 0.0–0.4)
ALPHA-2-GLOBULIN: 0.7 g/dL (ref 0.4–1.0)
Albumin ELP: 3.1 g/dL (ref 2.9–4.4)
Beta Globulin: 0.8 g/dL (ref 0.7–1.3)
Gamma Globulin: 0.9 g/dL (ref 0.4–1.8)
Globulin, Total: 2.6 g/dL (ref 2.2–3.9)
Total Protein ELP: 5.7 g/dL — ABNORMAL LOW (ref 6.0–8.5)

## 2017-08-20 ENCOUNTER — Telehealth: Payer: Self-pay | Admitting: Family

## 2017-08-20 NOTE — Telephone Encounter (Signed)
I was able to go over his lab work from last week with Rickey [name]. Dr. Marin Olp would like to meet with him to discuss the needed change in treatment. He and his wife are currently out of town but will be back in 2 weeks. He will make an appointment for that time.

## 2017-08-28 DIAGNOSIS — C9000 Multiple myeloma not having achieved remission: Secondary | ICD-10-CM | POA: Diagnosis not present

## 2017-08-28 DIAGNOSIS — E109 Type 1 diabetes mellitus without complications: Secondary | ICD-10-CM | POA: Diagnosis not present

## 2017-09-08 ENCOUNTER — Other Ambulatory Visit: Payer: Self-pay

## 2017-09-08 ENCOUNTER — Inpatient Hospital Stay: Payer: Medicare Other | Attending: Hematology & Oncology | Admitting: Hematology & Oncology

## 2017-09-08 ENCOUNTER — Encounter: Payer: Self-pay | Admitting: Hematology & Oncology

## 2017-09-08 VITALS — BP 125/48 | HR 72 | Temp 98.0°F | Resp 16 | Wt 213.0 lb

## 2017-09-08 DIAGNOSIS — C9002 Multiple myeloma in relapse: Secondary | ICD-10-CM | POA: Diagnosis not present

## 2017-09-08 DIAGNOSIS — C9000 Multiple myeloma not having achieved remission: Secondary | ICD-10-CM

## 2017-09-08 DIAGNOSIS — E119 Type 2 diabetes mellitus without complications: Secondary | ICD-10-CM

## 2017-09-08 NOTE — Progress Notes (Signed)
Hematology and Oncology Follow Up Visit  Rickey [name] [id number] 1940-06-16 77 y.o. 09/08/2017   Principle Diagnosis:  Kappa light chain myeloma - clinical relapse Traumatic fracture of left elbow - status post surgical repair in February 2018  Current Therapy:   Ninlaro/Pomalyst - s/p cycle #3 - d/c due to progression Kyprolis/Pomalidomide - start cycle #1 on 09/29/2017   Interim History:  Rickey [name] is here today with his wife for follow-up.  Unfortunately, I think it is apparent that his disease is progressing.  His light chain levels have been kept going up.  A month ago, his Kappa Lightchain was 26.5 mg/dL.  Prior to this, his light chain was 21 mg/dL.  In November 2018 the Kappa Lightchain was 18 mg/dL.  He is gotten "a lot of mileage" out of Velcade and Ninlaro.  However, I think it is apparent that we are going to have to make a change.  I think that he would benefit from Kyprolis with Pomalidomide.  I know that this is IV.  I know that this is more inconvenient.  However, given the new FDA approved weekly dosing, this might be acceptable.  As such, I think this would be my recommendation.  Currently, they are [place].  [place] for the summertime and fall.  That have been treated up at the cancer center in [place], [place] previously.  I will call the doctor up there to see if we cannot get him set up for treatment.  Otherwise he is doing quite well.  He is managing his diabetes incredibly well.  He has had diabetes for 54 years really without any complications.  He has had no nausea or vomiting.  Is been no change in bowel or bladder habits.  He has had no rashes.  Is had no leg swelling.  He has had no headache.  Overall, his performance status is ECOG 1.   Medications:  Allergies as of 09/08/2017   No Known Allergies     Medication List        Accurate as of 09/08/17  5:14 PM.  Always use your most recent med list.          acyclovir 400 MG tablet Commonly known as:  ZOVIRAX TAKE 1 TABLET(400 MG) BY MOUTH TWICE DAILY. 90 day supply   amLODipine 2.5 MG tablet Commonly known as:  NORVASC Take 2.5 mg by mouth daily.   aspirin 81 MG tablet Take 81 mg by mouth daily.   atorvastatin 10 MG tablet Commonly known as:  LIPITOR Take 10 mg by mouth daily.   calcium-vitamin D 250-125 MG-UNIT tablet Commonly known as:  OSCAL Take 1 tablet by mouth daily.   cycloSPORINE 0.05 % ophthalmic emulsion Commonly known as:  RESTASIS 1 drop 2 (two) times daily.   fish oil-omega-3 fatty acids 1000 MG capsule Take 2 g by mouth 2 (two) times daily.   INSULIN LISP & LISP PROT (HUM) [place]   insulin lispro 100 UNIT/ML injection Commonly known as:  HUMALOG Inject into the skin. Insulin pump   irbesartan-hydrochlorothiazide 300-12.5 MG tablet Commonly known as:  AVALIDE   ixazomib citrate 4 MG capsule Commonly known as:  NINLARO Take 1 capsule weekly for 3 weeks and 1 week off.Take on an empty stomach 1hr before or 2hrs after food. Do not crush, chew or open.   montelukast 10 MG tablet Commonly known as:  SINGULAIR Take 10 mg by mouth at bedtime.   pomalidomide 4 MG capsule Commonly known as:  POMALYST Take 1 capsule (4 mg total) by mouth daily. Take with water on days 1-21. Repeat every 28 days. [id number]   telmisartan-hydrochlorothiazide 80-12.5 MG tablet Commonly known as:  MICARDIS HCT Take 1 tablet by mouth daily.   Vitamin D 2000 units Caps Take by mouth every morning.   XIFAXAN 200 MG tablet Generic drug:  rifaximin one tablet three times a day for three days for traveler's diarrhea       Allergies: No Known Allergies  Past Medical History, Surgical history, Social history, and Family History were reviewed and updated.  Review of Systems: Review of Systems  Constitutional: Negative.   HENT: Negative.   Eyes: Negative.   Respiratory:  Negative.   Cardiovascular: Negative.   Gastrointestinal: Negative.   Genitourinary: Negative.   Musculoskeletal: Negative.   Skin: Negative.   Neurological: Negative.   Endo/Heme/Allergies: Negative.   Psychiatric/Behavioral: Negative.      Physical Exam:  weight is 213 lb (96.6 kg). His oral temperature is 98 F (36.7 C). His blood pressure is 125/48 (abnormal) and his pulse is 72. His respiration is 16 and oxygen saturation is 100%.   Wt Readings from Last 3 Encounters:  09/08/17 213 lb (96.6 kg)  08/15/17 209 lb (94.8 kg)  06/20/17 212 lb (96.2 kg)    Physical Exam  Constitutional: He is oriented to person, place, and time.  HENT:  Head: Normocephalic and atraumatic.  Mouth/Throat: Oropharynx is clear and moist.  Eyes: Pupils are equal, round, and reactive to light. EOM are normal.  Neck: Normal range of motion.  Cardiovascular: Normal rate, regular rhythm and normal heart sounds.  Pulmonary/Chest: Effort normal and breath sounds normal.  Abdominal: Soft. Bowel sounds are normal.  Musculoskeletal: Normal range of motion. He exhibits no edema, tenderness or deformity.  Lymphadenopathy:    He has no cervical adenopathy.  Neurological: He is alert and oriented to person, place, and time.  Skin: Skin is warm and dry. No rash noted. No erythema.  Psychiatric: He has a normal mood and affect. His behavior is normal. Judgment and thought content normal.  Vitals reviewed.    Lab Results  Component Value Date   WBC 3.7 (L) 08/15/2017   HGB 15.8 04/23/2017   HCT 44.9 08/15/2017   MCV 102.0 (H) 08/15/2017   PLT 126 (L) 08/15/2017   No results found for: FERRITIN, IRON, TIBC, UIBC, IRONPCTSAT Lab Results  Component Value Date   RBC 4.40 08/15/2017   Lab Results  Component Value Date   KPAFRELGTCHN 265.2 (H) 08/15/2017   LAMBDASER 11.3 08/15/2017   KAPLAMBRATIO 23.47 (H) 08/15/2017   Lab Results  Component Value Date   IGGSERUM 803 08/15/2017   IGA 196  08/15/2017   IGMSERUM 18 08/15/2017   Lab Results  Component Value Date   TOTALPROTELP 5.7 (L) 08/15/2017   ALBUMINELP 3.1 08/15/2017   A1GS 0.2 08/15/2017   A2GS 0.7 08/15/2017   BETS 0.8 08/15/2017   BETA2SER 0.3 04/19/2015   GAMS 0.9 08/15/2017   MSPIKE Not Observed 08/15/2017   SPEI * 04/19/2015     Chemistry      Component Value Date/Time   NA 141 08/15/2017 1117   NA 142 04/23/2017 1431   NA 139 07/18/2016 1135   K 4.2 08/15/2017 1117   K 4.0 04/23/2017 1431   K 4.4 07/18/2016 1135   CL 105 08/15/2017 1117  CL 105 04/23/2017 1431   CO2 31 08/15/2017 1117   CO2 28 04/23/2017 1431   CO2 27 07/18/2016 1135   BUN 16 08/15/2017 1117   BUN 20 04/23/2017 1431   BUN 12.2 07/18/2016 1135   CREATININE 1.00 08/15/2017 1117   CREATININE 1.2 04/23/2017 1431   CREATININE 0.9 07/18/2016 1135      Component Value Date/Time   CALCIUM 8.9 08/15/2017 1117   CALCIUM 9.3 04/23/2017 1431   CALCIUM 8.9 07/18/2016 1135   ALKPHOS 49 08/15/2017 1117   ALKPHOS 64 04/23/2017 1431   ALKPHOS 58 07/18/2016 1135   AST 23 08/15/2017 1117   AST 15 07/18/2016 1135   ALT 29 08/15/2017 1117   ALT 22 04/23/2017 1431   ALT 20 07/18/2016 1135   BILITOT 1.0 08/15/2017 1117   BILITOT 1.24 (H) 07/18/2016 1135      Impression and Plan: Rickey [name] is a very pleasant 77 yo caucasian gentleman with 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   Again, I think we will had to make a change.  I do think that Kyprolis with Pomalidomide would help.  I would avoid Decadron because of his diabetes.  Again we will call the local cancer center up in [place], [place] and try to get all this arranged to start in early May.  I would treat him with 3 weeks on and one-week off.  I think this is very effective and very well tolerated.  I probably would see him in another 6 weeks or so after he has had a couple cycles of treatment.   [name], MD 4/15/20195:14 PM

## 2017-09-10 ENCOUNTER — Other Ambulatory Visit: Payer: Self-pay | Admitting: *Deleted

## 2017-09-10 DIAGNOSIS — C9000 Multiple myeloma not having achieved remission: Secondary | ICD-10-CM

## 2017-09-10 DIAGNOSIS — S91332A Puncture wound without foreign body, left foot, initial encounter: Secondary | ICD-10-CM

## 2017-09-10 MED ORDER — POMALIDOMIDE 4 MG PO CAPS: 4.0000 mg | ORAL_CAPSULE | Freq: Every day | ORAL | 0 refills | Status: DC

## 2017-09-11 ENCOUNTER — Encounter: Payer: Self-pay | Admitting: Hematology & Oncology

## 2017-09-29 DIAGNOSIS — C9002 Multiple myeloma in relapse: Secondary | ICD-10-CM | POA: Diagnosis not present

## 2017-09-30 DIAGNOSIS — C9002 Multiple myeloma in relapse: Secondary | ICD-10-CM | POA: Diagnosis not present

## 2017-10-02 DIAGNOSIS — C9002 Multiple myeloma in relapse: Secondary | ICD-10-CM | POA: Diagnosis not present

## 2017-10-02 DIAGNOSIS — Z7189 Other specified counseling: Secondary | ICD-10-CM | POA: Diagnosis not present

## 2017-10-02 DIAGNOSIS — Z79899 Other long term (current) drug therapy: Secondary | ICD-10-CM | POA: Diagnosis not present

## 2017-10-07 DIAGNOSIS — Z5111 Encounter for antineoplastic chemotherapy: Secondary | ICD-10-CM | POA: Diagnosis not present

## 2017-10-07 DIAGNOSIS — Z79899 Other long term (current) drug therapy: Secondary | ICD-10-CM | POA: Diagnosis not present

## 2017-10-07 DIAGNOSIS — C9002 Multiple myeloma in relapse: Secondary | ICD-10-CM | POA: Diagnosis not present

## 2017-10-07 DIAGNOSIS — R5383 Other fatigue: Secondary | ICD-10-CM | POA: Diagnosis not present

## 2017-10-13 DIAGNOSIS — J188 Other pneumonia, unspecified organism: Secondary | ICD-10-CM | POA: Diagnosis not present

## 2017-10-13 DIAGNOSIS — Z794 Long term (current) use of insulin: Secondary | ICD-10-CM | POA: Diagnosis not present

## 2017-10-13 DIAGNOSIS — R05 Cough: Secondary | ICD-10-CM | POA: Diagnosis not present

## 2017-10-13 DIAGNOSIS — E109 Type 1 diabetes mellitus without complications: Secondary | ICD-10-CM | POA: Diagnosis not present

## 2017-10-13 DIAGNOSIS — A3701 Whooping cough due to Bordetella pertussis with pneumonia: Secondary | ICD-10-CM | POA: Diagnosis not present

## 2017-10-13 DIAGNOSIS — Z87891 Personal history of nicotine dependence: Secondary | ICD-10-CM | POA: Diagnosis not present

## 2017-10-13 DIAGNOSIS — C9000 Multiple myeloma not having achieved remission: Secondary | ICD-10-CM | POA: Diagnosis not present

## 2017-10-13 DIAGNOSIS — Z7982 Long term (current) use of aspirin: Secondary | ICD-10-CM | POA: Diagnosis not present

## 2017-10-13 DIAGNOSIS — E785 Hyperlipidemia, unspecified: Secondary | ICD-10-CM | POA: Diagnosis not present

## 2017-10-13 DIAGNOSIS — R0989 Other specified symptoms and signs involving the circulatory and respiratory systems: Secondary | ICD-10-CM | POA: Diagnosis not present

## 2017-10-13 DIAGNOSIS — E860 Dehydration: Secondary | ICD-10-CM | POA: Diagnosis not present

## 2017-10-13 DIAGNOSIS — I10 Essential (primary) hypertension: Secondary | ICD-10-CM | POA: Diagnosis not present

## 2017-10-13 DIAGNOSIS — Z9641 Presence of insulin pump (external) (internal): Secondary | ICD-10-CM | POA: Diagnosis not present

## 2017-10-13 DIAGNOSIS — Z9484 Stem cells transplant status: Secondary | ICD-10-CM | POA: Diagnosis not present

## 2017-10-13 DIAGNOSIS — Z79899 Other long term (current) drug therapy: Secondary | ICD-10-CM | POA: Diagnosis not present

## 2017-10-16 ENCOUNTER — Other Ambulatory Visit: Payer: Medicare Other

## 2017-10-16 ENCOUNTER — Ambulatory Visit: Payer: Medicare Other | Admitting: Hematology & Oncology

## 2017-10-16 DIAGNOSIS — E785 Hyperlipidemia, unspecified: Secondary | ICD-10-CM | POA: Diagnosis not present

## 2017-10-16 DIAGNOSIS — Z79899 Other long term (current) drug therapy: Secondary | ICD-10-CM | POA: Diagnosis not present

## 2017-10-16 DIAGNOSIS — Z9641 Presence of insulin pump (external) (internal): Secondary | ICD-10-CM | POA: Diagnosis not present

## 2017-10-16 DIAGNOSIS — I10 Essential (primary) hypertension: Secondary | ICD-10-CM | POA: Diagnosis not present

## 2017-10-16 DIAGNOSIS — J188 Other pneumonia, unspecified organism: Secondary | ICD-10-CM | POA: Diagnosis not present

## 2017-10-16 DIAGNOSIS — C9000 Multiple myeloma not having achieved remission: Secondary | ICD-10-CM | POA: Diagnosis not present

## 2017-10-16 DIAGNOSIS — Z9484 Stem cells transplant status: Secondary | ICD-10-CM | POA: Diagnosis not present

## 2017-10-16 DIAGNOSIS — Z7982 Long term (current) use of aspirin: Secondary | ICD-10-CM | POA: Diagnosis not present

## 2017-10-16 DIAGNOSIS — Z87891 Personal history of nicotine dependence: Secondary | ICD-10-CM | POA: Diagnosis not present

## 2017-10-16 DIAGNOSIS — A3701 Whooping cough due to Bordetella pertussis with pneumonia: Secondary | ICD-10-CM | POA: Diagnosis not present

## 2017-10-16 DIAGNOSIS — Z794 Long term (current) use of insulin: Secondary | ICD-10-CM | POA: Diagnosis not present

## 2017-10-16 DIAGNOSIS — R05 Cough: Secondary | ICD-10-CM | POA: Diagnosis not present

## 2017-10-16 DIAGNOSIS — E109 Type 1 diabetes mellitus without complications: Secondary | ICD-10-CM | POA: Diagnosis not present

## 2017-10-16 DIAGNOSIS — J189 Pneumonia, unspecified organism: Secondary | ICD-10-CM | POA: Diagnosis not present

## 2017-10-16 DIAGNOSIS — E860 Dehydration: Secondary | ICD-10-CM | POA: Diagnosis not present

## 2017-10-16 DIAGNOSIS — R0989 Other specified symptoms and signs involving the circulatory and respiratory systems: Secondary | ICD-10-CM | POA: Diagnosis not present

## 2017-10-23 DIAGNOSIS — Z7982 Long term (current) use of aspirin: Secondary | ICD-10-CM | POA: Diagnosis not present

## 2017-10-23 DIAGNOSIS — Z9484 Stem cells transplant status: Secondary | ICD-10-CM | POA: Diagnosis not present

## 2017-10-23 DIAGNOSIS — Z79899 Other long term (current) drug therapy: Secondary | ICD-10-CM | POA: Diagnosis not present

## 2017-10-23 DIAGNOSIS — Z5111 Encounter for antineoplastic chemotherapy: Secondary | ICD-10-CM | POA: Diagnosis not present

## 2017-10-23 DIAGNOSIS — E118 Type 2 diabetes mellitus with unspecified complications: Secondary | ICD-10-CM | POA: Diagnosis not present

## 2017-10-23 DIAGNOSIS — C9002 Multiple myeloma in relapse: Secondary | ICD-10-CM | POA: Diagnosis not present

## 2017-11-04 DIAGNOSIS — M5030 Other cervical disc degeneration, unspecified cervical region: Secondary | ICD-10-CM | POA: Diagnosis not present

## 2017-11-04 DIAGNOSIS — R112 Nausea with vomiting, unspecified: Secondary | ICD-10-CM | POA: Diagnosis not present

## 2017-11-04 DIAGNOSIS — I951 Orthostatic hypotension: Secondary | ICD-10-CM | POA: Diagnosis not present

## 2017-11-04 DIAGNOSIS — H5500 Unspecified nystagmus: Secondary | ICD-10-CM | POA: Diagnosis present

## 2017-11-04 DIAGNOSIS — Z9484 Stem cells transplant status: Secondary | ICD-10-CM | POA: Diagnosis not present

## 2017-11-04 DIAGNOSIS — N170 Acute kidney failure with tubular necrosis: Secondary | ICD-10-CM | POA: Diagnosis not present

## 2017-11-04 DIAGNOSIS — Z794 Long term (current) use of insulin: Secondary | ICD-10-CM | POA: Diagnosis not present

## 2017-11-04 DIAGNOSIS — C9002 Multiple myeloma in relapse: Secondary | ICD-10-CM | POA: Diagnosis not present

## 2017-11-04 DIAGNOSIS — S51811A Laceration without foreign body of right forearm, initial encounter: Secondary | ICD-10-CM | POA: Diagnosis not present

## 2017-11-04 DIAGNOSIS — R000 Tachycardia, unspecified: Secondary | ICD-10-CM | POA: Diagnosis not present

## 2017-11-04 DIAGNOSIS — W19XXXA Unspecified fall, initial encounter: Secondary | ICD-10-CM | POA: Diagnosis not present

## 2017-11-04 DIAGNOSIS — E119 Type 2 diabetes mellitus without complications: Secondary | ICD-10-CM | POA: Diagnosis not present

## 2017-11-04 DIAGNOSIS — S0990XA Unspecified injury of head, initial encounter: Secondary | ICD-10-CM | POA: Diagnosis not present

## 2017-11-04 DIAGNOSIS — H1089 Other conjunctivitis: Secondary | ICD-10-CM | POA: Diagnosis present

## 2017-11-04 DIAGNOSIS — E860 Dehydration: Secondary | ICD-10-CM | POA: Diagnosis not present

## 2017-11-04 DIAGNOSIS — Z7982 Long term (current) use of aspirin: Secondary | ICD-10-CM | POA: Diagnosis not present

## 2017-11-04 DIAGNOSIS — Z87891 Personal history of nicotine dependence: Secondary | ICD-10-CM | POA: Diagnosis not present

## 2017-11-04 DIAGNOSIS — R634 Abnormal weight loss: Secondary | ICD-10-CM | POA: Diagnosis present

## 2017-11-04 DIAGNOSIS — J9811 Atelectasis: Secondary | ICD-10-CM | POA: Diagnosis not present

## 2017-11-04 DIAGNOSIS — D6959 Other secondary thrombocytopenia: Secondary | ICD-10-CM | POA: Diagnosis not present

## 2017-11-04 DIAGNOSIS — M4802 Spinal stenosis, cervical region: Secondary | ICD-10-CM | POA: Diagnosis not present

## 2017-11-04 DIAGNOSIS — E785 Hyperlipidemia, unspecified: Secondary | ICD-10-CM | POA: Diagnosis present

## 2017-11-04 DIAGNOSIS — R079 Chest pain, unspecified: Secondary | ICD-10-CM | POA: Diagnosis not present

## 2017-11-04 DIAGNOSIS — R110 Nausea: Secondary | ICD-10-CM | POA: Diagnosis not present

## 2017-11-04 DIAGNOSIS — T451X5A Adverse effect of antineoplastic and immunosuppressive drugs, initial encounter: Secondary | ICD-10-CM | POA: Diagnosis not present

## 2017-11-04 DIAGNOSIS — R531 Weakness: Secondary | ICD-10-CM | POA: Diagnosis not present

## 2017-11-04 DIAGNOSIS — W1809XA Striking against other object with subsequent fall, initial encounter: Secondary | ICD-10-CM | POA: Diagnosis not present

## 2017-11-04 DIAGNOSIS — I10 Essential (primary) hypertension: Secondary | ICD-10-CM | POA: Diagnosis present

## 2017-11-04 DIAGNOSIS — S0181XA Laceration without foreign body of other part of head, initial encounter: Secondary | ICD-10-CM | POA: Diagnosis not present

## 2017-11-04 DIAGNOSIS — K5900 Constipation, unspecified: Secondary | ICD-10-CM | POA: Diagnosis present

## 2017-11-04 DIAGNOSIS — Z9641 Presence of insulin pump (external) (internal): Secondary | ICD-10-CM | POA: Diagnosis present

## 2017-11-05 ENCOUNTER — Telehealth: Payer: Self-pay | Admitting: *Deleted

## 2017-11-05 ENCOUNTER — Inpatient Hospital Stay: Payer: Medicare Other | Admitting: Hematology & Oncology

## 2017-11-05 ENCOUNTER — Inpatient Hospital Stay: Payer: Medicare Other

## 2017-11-05 NOTE — Telephone Encounter (Signed)
Patient's wife is calling to cancel todays appointment as he has been admitted to the hospital in [place]. He has been receiving treatment in [place] with Kyprolis and he is not tolerating the treatment well. He was admitted yesterday after severe n/v in addition to a fall with a superficial head injury. Appointment cancelled, and she will call back when patient is able to come.  Dr Marin Olp notified of situation. He will attempt to reach out to [place] personally.

## 2017-11-10 DIAGNOSIS — I951 Orthostatic hypotension: Secondary | ICD-10-CM | POA: Diagnosis not present

## 2017-11-10 DIAGNOSIS — S51811D Laceration without foreign body of right forearm, subsequent encounter: Secondary | ICD-10-CM | POA: Diagnosis not present

## 2017-11-10 DIAGNOSIS — E119 Type 2 diabetes mellitus without complications: Secondary | ICD-10-CM | POA: Diagnosis not present

## 2017-11-10 DIAGNOSIS — C9000 Multiple myeloma not having achieved remission: Secondary | ICD-10-CM | POA: Diagnosis not present

## 2017-11-10 DIAGNOSIS — I10 Essential (primary) hypertension: Secondary | ICD-10-CM | POA: Diagnosis not present

## 2017-11-10 DIAGNOSIS — H109 Unspecified conjunctivitis: Secondary | ICD-10-CM | POA: Diagnosis not present

## 2017-11-11 DIAGNOSIS — I959 Hypotension, unspecified: Secondary | ICD-10-CM | POA: Diagnosis not present

## 2017-11-12 DIAGNOSIS — I10 Essential (primary) hypertension: Secondary | ICD-10-CM | POA: Diagnosis not present

## 2017-11-12 DIAGNOSIS — A3790 Whooping cough, unspecified species without pneumonia: Secondary | ICD-10-CM | POA: Diagnosis not present

## 2017-11-12 DIAGNOSIS — Z6827 Body mass index (BMI) 27.0-27.9, adult: Secondary | ICD-10-CM | POA: Diagnosis not present

## 2017-11-12 DIAGNOSIS — C9000 Multiple myeloma not having achieved remission: Secondary | ICD-10-CM | POA: Diagnosis not present

## 2017-11-12 DIAGNOSIS — E109 Type 1 diabetes mellitus without complications: Secondary | ICD-10-CM | POA: Diagnosis not present

## 2017-11-13 ENCOUNTER — Ambulatory Visit: Payer: Medicare Other | Admitting: Hematology & Oncology

## 2017-11-13 ENCOUNTER — Other Ambulatory Visit: Payer: Medicare Other

## 2017-11-14 ENCOUNTER — Inpatient Hospital Stay (HOSPITAL_BASED_OUTPATIENT_CLINIC_OR_DEPARTMENT_OTHER): Payer: Medicare Other | Admitting: Hematology & Oncology

## 2017-11-14 ENCOUNTER — Encounter: Payer: Self-pay | Admitting: Hematology & Oncology

## 2017-11-14 ENCOUNTER — Other Ambulatory Visit: Payer: Self-pay

## 2017-11-14 ENCOUNTER — Inpatient Hospital Stay: Payer: Medicare Other | Attending: Hematology & Oncology

## 2017-11-14 VITALS — BP 118/55 | HR 66 | Temp 98.0°F | Resp 18 | Wt 200.0 lb

## 2017-11-14 DIAGNOSIS — E1022 Type 1 diabetes mellitus with diabetic chronic kidney disease: Secondary | ICD-10-CM

## 2017-11-14 DIAGNOSIS — E119 Type 2 diabetes mellitus without complications: Secondary | ICD-10-CM

## 2017-11-14 DIAGNOSIS — C9002 Multiple myeloma in relapse: Secondary | ICD-10-CM | POA: Insufficient documentation

## 2017-11-14 DIAGNOSIS — N182 Chronic kidney disease, stage 2 (mild): Principal | ICD-10-CM

## 2017-11-14 DIAGNOSIS — C9000 Multiple myeloma not having achieved remission: Secondary | ICD-10-CM

## 2017-11-14 DIAGNOSIS — I4891 Unspecified atrial fibrillation: Secondary | ICD-10-CM

## 2017-11-14 LAB — CMP (CANCER CENTER ONLY)
ALT: 27 U/L (ref 10–47)
ANION GAP: 6 (ref 5–15)
AST: 24 U/L (ref 11–38)
Albumin: 3.1 g/dL — ABNORMAL LOW (ref 3.5–5.0)
Alkaline Phosphatase: 53 U/L (ref 26–84)
BUN: 9 mg/dL (ref 7–22)
CO2: 29 mmol/L (ref 18–33)
Calcium: 8.5 mg/dL (ref 8.0–10.3)
Chloride: 104 mmol/L (ref 98–108)
Creatinine: 0.8 mg/dL (ref 0.60–1.20)
GLUCOSE: 178 mg/dL — AB (ref 73–118)
POTASSIUM: 4.1 mmol/L (ref 3.3–4.7)
Sodium: 139 mmol/L (ref 128–145)
Total Bilirubin: 1.1 mg/dL (ref 0.2–1.6)
Total Protein: 5.6 g/dL — ABNORMAL LOW (ref 6.4–8.1)

## 2017-11-14 LAB — CBC WITH DIFFERENTIAL (CANCER CENTER ONLY)
BASOS ABS: 0.1 10*3/uL (ref 0.0–0.1)
Basophils Relative: 3 %
EOS PCT: 4 %
Eosinophils Absolute: 0.2 10*3/uL (ref 0.0–0.5)
HCT: 40.7 % (ref 38.7–49.9)
Hemoglobin: 13.6 g/dL (ref 13.0–17.1)
LYMPHS PCT: 23 %
Lymphs Abs: 0.8 10*3/uL — ABNORMAL LOW (ref 0.9–3.3)
MCH: 34.4 pg — AB (ref 28.0–33.4)
MCHC: 33.4 g/dL (ref 32.0–35.9)
MCV: 103 fL — AB (ref 82.0–98.0)
MONOS PCT: 24 %
Monocytes Absolute: 0.9 10*3/uL (ref 0.1–0.9)
Neutro Abs: 1.6 10*3/uL (ref 1.5–6.5)
Neutrophils Relative %: 46 %
PLATELETS: 249 10*3/uL (ref 145–400)
RBC: 3.95 MIL/uL — ABNORMAL LOW (ref 4.20–5.70)
RDW: 15.1 % (ref 11.1–15.7)
WBC Count: 3.6 10*3/uL — ABNORMAL LOW (ref 4.0–10.0)

## 2017-11-14 NOTE — Progress Notes (Signed)
Hematology and Oncology Follow Up Visit  Rickey [name] [id number] 01-15-1941 77 y.o. 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  Unfortunately, it is really been a tough go for him since starting Kyprolis.  He started Kyprolis up in [place] at the [facility] center.  He then developed whooping cough.  He continued the Kyprolis.  He was then hospitalized because his breathing became severe.  He was hospitalized for a few days.  Unfortunately, no EKG was done.  He was placed on azithromycin.  I spoke to his oncologist up in [place], Dr. [place] Kidney.  He is very nice.  He did send off some light chains up there.  Rickey [name] has had 4 cycles of the Kyprolis.  He is feeling better.  And gone through the records from the hospitalization, looks like he may have developed transient atrial fibrillation.  I am not sure if there was any toxicity to the Kyprolis.  I think an echocardiogram would be very helpful.  He has had no fever.  His appetite is coming back.  Blood sugars have been running okay.  He and his wife are gone to the beach to their beach house for their yearly family vacation.  Overall, his performance status is ECOG 1.   Medications:  Allergies as of 11/14/2017   No Known Allergies     Medication List        Accurate as of 11/14/17  5:13 PM. Always use your most recent med list.          acyclovir 400 MG tablet Commonly known as:  ZOVIRAX TAKE 1 TABLET(400 MG) BY MOUTH TWICE DAILY. 90 day supply   amLODipine 2.5 MG tablet Commonly known as:  NORVASC Take 2.5 mg by mouth daily.   aspirin 81 MG tablet Take 81 mg by mouth daily.   atorvastatin 10 MG  tablet Commonly known as:  LIPITOR Take 10 mg by mouth daily.   calcium-vitamin D 250-125 MG-UNIT tablet Commonly known as:  OSCAL Take 1 tablet by mouth daily.   cycloSPORINE 0.05 % ophthalmic emulsion Commonly known as:  RESTASIS 1 drop 2 (two) times daily.   fish oil-omega-3 fatty acids 1000 MG capsule Take 2 g by mouth 2 (two) times daily.   INSULIN LISP & LISP PROT (HUM) [place]   insulin lispro 100 UNIT/ML injection Commonly known as:  HUMALOG Inject into the skin. Insulin pump   irbesartan-hydrochlorothiazide 300-12.5 MG tablet Commonly known as:  AVALIDE   ixazomib citrate 4 MG capsule Commonly known as:  NINLARO Take 1 capsule weekly for 3 weeks and 1 week off.Take on an empty stomach 1hr before or 2hrs after food. Do not crush, chew or open.   montelukast 10 MG tablet Commonly known as:  SINGULAIR Take 10 mg by mouth at bedtime.   pomalidomide 4 MG capsule Commonly known as:  POMALYST Take 1 capsule (4 mg total) by mouth daily. Take with water on days 1-21. Repeat every 28 days. [id number]   telmisartan-hydrochlorothiazide 80-12.5 MG tablet Commonly known as:  MICARDIS HCT Take 1 tablet by mouth daily.   Vitamin D 2000 units Caps Take by mouth every morning.   XIFAXAN 200  MG tablet Generic drug:  rifaximin one tablet three times a day for three days for traveler's diarrhea       Allergies: No Known Allergies  Past Medical History, Surgical history, Social history, and Family History were reviewed and updated.  Review of Systems: Review of Systems  Constitutional: Negative.   HENT: Negative.   Eyes: Negative.   Respiratory: Negative.   Cardiovascular: Negative.   Gastrointestinal: Negative.   Genitourinary: Negative.   Musculoskeletal: Negative.   Skin: Negative.   Neurological: Negative.   Endo/Heme/Allergies: Negative.   Psychiatric/Behavioral: Negative.      Physical Exam:  weight is 200 lb (90.7 kg). His oral temperature is 98 F (36.7  C). His blood pressure is 118/55 (abnormal) and his pulse is 66. His respiration is 18 and oxygen saturation is 100%.   Wt Readings from Last 3 Encounters:  11/14/17 200 lb (90.7 kg)  09/08/17 213 lb (96.6 kg)  08/15/17 209 lb (94.8 kg)    Physical Exam  Constitutional: He is oriented to person, place, and time.  HENT:  Head: Normocephalic and atraumatic.  Mouth/Throat: Oropharynx is clear and moist.  Eyes: Pupils are equal, round, and reactive to light. EOM are normal.  Neck: Normal range of motion.  Cardiovascular: Normal rate, regular rhythm and normal heart sounds.  Pulmonary/Chest: Effort normal and breath sounds normal.  Abdominal: Soft. Bowel sounds are normal.  Musculoskeletal: Normal range of motion. He exhibits no edema, tenderness or deformity.  Lymphadenopathy:    He has no cervical adenopathy.  Neurological: He is alert and oriented to person, place, and time.  Skin: Skin is warm and dry. No rash noted. No erythema.  Psychiatric: He has a normal mood and affect. His behavior is normal. Judgment and thought content normal.  Vitals reviewed.    Lab Results  Component Value Date   WBC 3.6 (L) 11/14/2017   HGB 13.6 11/14/2017   HCT 40.7 11/14/2017   MCV 103.0 (H) 11/14/2017   PLT 249 11/14/2017   No results found for: FERRITIN, IRON, TIBC, UIBC, IRONPCTSAT Lab Results  Component Value Date   RBC 3.95 (L) 11/14/2017   Lab Results  Component Value Date   KPAFRELGTCHN 265.2 (H) 08/15/2017   LAMBDASER 11.3 08/15/2017   KAPLAMBRATIO 23.47 (H) 08/15/2017   Lab Results  Component Value Date   IGGSERUM 803 08/15/2017   IGA 196 08/15/2017   IGMSERUM 18 08/15/2017   Lab Results  Component Value Date   TOTALPROTELP 5.7 (L) 08/15/2017   ALBUMINELP 3.1 08/15/2017   A1GS 0.2 08/15/2017   A2GS 0.7 08/15/2017   BETS 0.8 08/15/2017   BETA2SER 0.3 04/19/2015   GAMS 0.9 08/15/2017   MSPIKE Not Observed 08/15/2017   SPEI * 04/19/2015     Chemistry        Component Value Date/Time   NA 139 11/14/2017 1443   NA 142 04/23/2017 1431   NA 139 07/18/2016 1135   K 4.1 11/14/2017 1443   K 4.0 04/23/2017 1431   K 4.4 07/18/2016 1135   CL 104 11/14/2017 1443   CL 105 04/23/2017 1431   CO2 29 11/14/2017 1443   CO2 28 04/23/2017 1431   CO2 27 07/18/2016 1135   BUN 9 11/14/2017 1443   BUN 20 04/23/2017 1431   BUN 12.2 07/18/2016 1135   CREATININE 0.80 11/14/2017 1443   CREATININE 1.2 04/23/2017 1431   CREATININE 0.9 07/18/2016 1135      Component Value Date/Time   CALCIUM 8.5 11/14/2017 1443  CALCIUM 9.3 04/23/2017 1431   CALCIUM 8.9 07/18/2016 1135   ALKPHOS 53 11/14/2017 1443   ALKPHOS 64 04/23/2017 1431   ALKPHOS 58 07/18/2016 1135   AST 24 11/14/2017 1443   AST 15 07/18/2016 1135   ALT 27 11/14/2017 1443   ALT 22 04/23/2017 1431   ALT 20 07/18/2016 1135   BILITOT 1.1 11/14/2017 1443   BILITOT 1.24 (H) 07/18/2016 1135      Impression and Plan: Rickey [name] is a very pleasant 77 yo caucasian gentleman with 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   I really would hate to give up on the Kyprolis.  I am still not sure that there was any problem.  Possibly, Rickey [name] having the whooping cough was the problem.  We will see what his light chains are.  If they are significantly better, then I think we should try to give Kyprolis another chance.  Again I would like to have a echocardiogram so we can see how his heart function is.  Rickey [name] said that he would like to have the Kyprolis down in our office.  We will see what we can do to accommodate him if we return to using Kyprolis.  He really has had a tough go.  Of note, I do not detect any atrial fibrillation when I listened to him today.  He does have the bandage on his right forearm.  From where his skin was peeling off.  I am sure that his diabetes probably was a factor also with him not doing too well.    [name], MD 6/21/20195:13 PM

## 2017-11-15 LAB — IGG, IGA, IGM
IgA: 34 mg/dL — ABNORMAL LOW (ref 61–437)
IgG (Immunoglobin G), Serum: 441 mg/dL — ABNORMAL LOW (ref 700–1600)
IgM (Immunoglobulin M), Srm: 13 mg/dL — ABNORMAL LOW (ref 15–143)

## 2017-11-17 LAB — KAPPA/LAMBDA LIGHT CHAINS
KAPPA FREE LGHT CHN: 57.2 mg/L — AB (ref 3.3–19.4)
KAPPA, LAMDA LIGHT CHAIN RATIO: 31.78 — AB (ref 0.26–1.65)
Lambda free light chains: 1.8 mg/L — ABNORMAL LOW (ref 5.7–26.3)

## 2017-11-18 LAB — PROTEIN ELECTROPHORESIS, SERUM, WITH REFLEX
A/G Ratio: 1.4 (ref 0.7–1.7)
ALPHA-1-GLOBULIN: 0.3 g/dL (ref 0.0–0.4)
ALPHA-2-GLOBULIN: 0.7 g/dL (ref 0.4–1.0)
Albumin ELP: 3 g/dL (ref 2.9–4.4)
BETA GLOBULIN: 0.8 g/dL (ref 0.7–1.3)
GLOBULIN, TOTAL: 2.2 g/dL (ref 2.2–3.9)
Gamma Globulin: 0.5 g/dL (ref 0.4–1.8)
Total Protein ELP: 5.2 g/dL — ABNORMAL LOW (ref 6.0–8.5)

## 2017-12-03 ENCOUNTER — Ambulatory Visit (HOSPITAL_BASED_OUTPATIENT_CLINIC_OR_DEPARTMENT_OTHER)
Admission: RE | Admit: 2017-12-03 | Discharge: 2017-12-03 | Disposition: A | Discharge status: Home / Self Care | Payer: Medicare Other | Source: Ambulatory Visit | Attending: Hematology & Oncology | Admitting: Hematology & Oncology

## 2017-12-03 ENCOUNTER — Ambulatory Visit (HOSPITAL_BASED_OUTPATIENT_CLINIC_OR_DEPARTMENT_OTHER): Payer: Medicare Other

## 2017-12-03 DIAGNOSIS — I10 Essential (primary) hypertension: Secondary | ICD-10-CM | POA: Diagnosis not present

## 2017-12-03 DIAGNOSIS — I4891 Unspecified atrial fibrillation: Secondary | ICD-10-CM

## 2017-12-03 DIAGNOSIS — E119 Type 2 diabetes mellitus without complications: Secondary | ICD-10-CM | POA: Diagnosis not present

## 2017-12-03 DIAGNOSIS — E785 Hyperlipidemia, unspecified: Secondary | ICD-10-CM | POA: Insufficient documentation

## 2017-12-03 NOTE — Progress Notes (Signed)
  Echocardiogram 2D Echocardiogram has been performed.  Pranit Owensby T Tandy Lewin 12/03/2017, 11:45 AM

## 2017-12-05 ENCOUNTER — Inpatient Hospital Stay: Payer: Medicare Other | Attending: Hematology & Oncology | Admitting: Hematology & Oncology

## 2017-12-05 ENCOUNTER — Other Ambulatory Visit: Payer: Self-pay | Admitting: Hematology & Oncology

## 2017-12-05 ENCOUNTER — Inpatient Hospital Stay: Payer: Medicare Other

## 2017-12-05 ENCOUNTER — Encounter: Payer: Self-pay | Admitting: Hematology & Oncology

## 2017-12-05 ENCOUNTER — Other Ambulatory Visit: Payer: Self-pay

## 2017-12-05 ENCOUNTER — Other Ambulatory Visit: Payer: Self-pay | Admitting: *Deleted

## 2017-12-05 VITALS — BP 117/56 | HR 73 | Temp 98.8°F | Resp 18 | Wt 198.0 lb

## 2017-12-05 DIAGNOSIS — C9000 Multiple myeloma not having achieved remission: Secondary | ICD-10-CM

## 2017-12-05 DIAGNOSIS — Z5112 Encounter for antineoplastic immunotherapy: Secondary | ICD-10-CM | POA: Diagnosis not present

## 2017-12-05 DIAGNOSIS — S91332A Puncture wound without foreign body, left foot, initial encounter: Secondary | ICD-10-CM

## 2017-12-05 DIAGNOSIS — C9002 Multiple myeloma in relapse: Secondary | ICD-10-CM | POA: Diagnosis not present

## 2017-12-05 LAB — CBC WITH DIFFERENTIAL (CANCER CENTER ONLY)
Basophils Absolute: 0 10*3/uL (ref 0.0–0.1)
Basophils Relative: 0 %
EOS ABS: 0.1 10*3/uL (ref 0.0–0.5)
EOS PCT: 1 %
HCT: 43.8 % (ref 38.7–49.9)
Hemoglobin: 14.6 g/dL (ref 13.0–17.1)
LYMPHS ABS: 1.7 10*3/uL (ref 0.9–3.3)
LYMPHS PCT: 34 %
MCH: 34.8 pg — AB (ref 28.0–33.4)
MCHC: 33.3 g/dL (ref 32.0–35.9)
MCV: 104.3 fL — AB (ref 82.0–98.0)
Monocytes Absolute: 0.7 10*3/uL (ref 0.1–0.9)
Monocytes Relative: 14 %
Neutro Abs: 2.5 10*3/uL (ref 1.5–6.5)
Neutrophils Relative %: 51 %
PLATELETS: 147 10*3/uL (ref 145–400)
RBC: 4.2 MIL/uL (ref 4.20–5.70)
RDW: 14.2 % (ref 11.1–15.7)
WBC Count: 4.9 10*3/uL (ref 4.0–10.0)

## 2017-12-05 LAB — CMP (CANCER CENTER ONLY)
ALT: 26 U/L (ref 10–47)
ANION GAP: 4 — AB (ref 5–15)
AST: 24 U/L (ref 11–38)
Albumin: 3.3 g/dL — ABNORMAL LOW (ref 3.5–5.0)
Alkaline Phosphatase: 56 U/L (ref 26–84)
BUN: 13 mg/dL (ref 7–22)
CHLORIDE: 104 mmol/L (ref 98–108)
CO2: 30 mmol/L (ref 18–33)
Calcium: 8.7 mg/dL (ref 8.0–10.3)
Creatinine: 0.9 mg/dL (ref 0.60–1.20)
GLUCOSE: 164 mg/dL — AB (ref 73–118)
Potassium: 4.5 mmol/L (ref 3.3–4.7)
Sodium: 138 mmol/L (ref 128–145)
Total Bilirubin: 1.3 mg/dL (ref 0.2–1.6)
Total Protein: 5.7 g/dL — ABNORMAL LOW (ref 6.4–8.1)

## 2017-12-05 MED ORDER — LORAZEPAM 0.5 MG PO TABS: 0.5000 mg | ORAL_TABLET | Freq: Four times a day (QID) | ORAL | 0 refills | Status: DC | PRN

## 2017-12-05 MED ORDER — POMALIDOMIDE 4 MG PO CAPS: 4.0000 mg | ORAL_CAPSULE | Freq: Every day | ORAL | 0 refills | Status: DC

## 2017-12-05 MED ORDER — PROCHLORPERAZINE MALEATE 10 MG PO TABS
10.0000 mg | ORAL_TABLET | Freq: Once | ORAL | Status: AC
Start: 2017-12-05 — End: 2017-12-05
  Administered 2017-12-05: 10 mg via ORAL

## 2017-12-05 MED ORDER — SODIUM CHLORIDE 0.9 % IV SOLN
Freq: Once | INTRAVENOUS | Status: AC
  Administered 2017-12-05: 10:00:00 via INTRAVENOUS

## 2017-12-05 MED ORDER — PROCHLORPERAZINE MALEATE 10 MG PO TABS: 10.0000 mg | ORAL_TABLET | Freq: Four times a day (QID) | ORAL | 1 refills | Status: DC | PRN

## 2017-12-05 MED ORDER — ONDANSETRON HCL 8 MG PO TABS: 8.0000 mg | ORAL_TABLET | Freq: Two times a day (BID) | ORAL | 1 refills | Status: DC | PRN

## 2017-12-05 MED ORDER — CARFILZOMIB CHEMO INJECTION 60 MG
70.0000 mg | Freq: Once | INTRAVENOUS | Status: AC
  Administered 2017-12-05: 70 mg via INTRAVENOUS
  Filled 2017-12-05: qty 30

## 2017-12-05 MED ORDER — PROCHLORPERAZINE MALEATE 10 MG PO TABS
ORAL_TABLET | ORAL | Status: AC
  Filled 2017-12-05: qty 1

## 2017-12-05 NOTE — Patient Instructions (Addendum)
  [facility] Discharge Instructions for Patients Receiving Chemotherapy  Today you received the following chemotherapy agents:  Kyprolis  To help prevent nausea and vomiting after your treatment, we encourage you to take your nausea medication as ordered per MD.    If you develop nausea and vomiting that is not controlled by your nausea medication, call the clinic.   BELOW ARE SYMPTOMS THAT SHOULD BE REPORTED IMMEDIATELY:  *FEVER GREATER THAN 100.5 F  *CHILLS WITH OR WITHOUT FEVER  NAUSEA AND VOMITING THAT IS NOT CONTROLLED WITH YOUR NAUSEA MEDICATION  *UNUSUAL SHORTNESS OF BREATH  *UNUSUAL BRUISING OR BLEEDING  TENDERNESS IN MOUTH AND THROAT WITH OR WITHOUT PRESENCE OF ULCERS  *URINARY PROBLEMS  *BOWEL PROBLEMS  UNUSUAL RASH Items with * indicate a potential emergency and should be followed up as soon as possible.  Feel free to call the clinic should you have any questions or concerns. The clinic phone number is [phone].  Please show the CHEMO ALERT CARD at check-in to the Emergency Department and triage nurse.   

## 2017-12-05 NOTE — Progress Notes (Signed)
Hematology and Oncology Follow Up Visit  Rickey [name] [id number] Jun 02, 1940 77 y.o. 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 Rickey [name] is here today with his wife for follow-up.  He looks a lot better.  He sounds a whole lot better.  This episode of pertussis that he has has resolved.  He has responded incredibly well to Kyprolis.  We will recheck his light chain level couple weeks ago, his Kappa Lightchain was already down to 52 mg/L.  Prior to treatment he was 265 mg/L.  We did do an echocardiogram on him.  The echocardiogram showed ejection fraction of 55-60%.  There is no obvious valvular issues.  He and his [place] had a [place] time [place] the beach.  His blood sugars are doing quite well.  He wants to get his Kyprolis in our office.  He and his wife like our nurses.  It is not that difficult for them to come down to our office.  Hopefully, we will be able to move his appointments out a little bit further as he responds.   Overall, his performance status is ECOG 1.   Medications:  Allergies as of 12/05/2017   No Known Allergies     Medication List        Accurate as of 12/05/17 10:38 AM. Always use your most recent med list.          acyclovir 400 MG tablet Commonly known as:  ZOVIRAX TAKE 1 TABLET(400 MG) BY MOUTH TWICE DAILY. 90 day supply   amLODipine 2.5 MG tablet Commonly known as:  NORVASC Take 2.5 mg by mouth daily.   aspirin 81 MG tablet Take 81 mg by mouth daily.   atorvastatin 10 MG tablet Commonly known as:  LIPITOR Take 10 mg by mouth daily.   calcium-vitamin D 250-125 MG-UNIT tablet Commonly known as:  OSCAL Take 1 tablet by mouth daily.   cycloSPORINE 0.05 % ophthalmic  emulsion Commonly known as:  RESTASIS 1 drop 2 (two) times daily.   fish oil-omega-3 fatty acids 1000 MG capsule Take 2 g by mouth 2 (two) times daily.   INSULIN LISP & LISP PROT (HUM) [place]   insulin lispro 100 UNIT/ML injection Commonly known as:  HUMALOG Inject into the skin. Insulin pump   irbesartan-hydrochlorothiazide 300-12.5 MG tablet Commonly known as:  AVALIDE   ixazomib citrate 4 MG capsule Commonly known as:  NINLARO Take 1 capsule weekly for 3 weeks and 1 week off.Take on an empty stomach 1hr before or 2hrs after food. Do not crush, chew or open.   LORazepam 0.5 MG tablet Commonly known as:  ATIVAN Take 1 tablet (0.5 mg total) by mouth every 6 (six) hours as needed (Nausea or vomiting).   montelukast 10 MG tablet Commonly known as:  SINGULAIR Take 10 mg by mouth at bedtime.   ondansetron 8 MG tablet Commonly known as:  ZOFRAN Take 1 tablet (8 mg total) by mouth 2 (two) times daily as needed (Nausea or vomiting).   pomalidomide 4 MG capsule Commonly known as:  POMALYST Take 1 capsule (4 mg total) by mouth daily. Take with water on days 1-21. Repeat every 28 days. [id number]   prochlorperazine 10 MG tablet Commonly known as:  COMPAZINE Take  1 tablet (10 mg total) by mouth every 6 (six) hours as needed (Nausea or vomiting).   telmisartan-hydrochlorothiazide 80-12.5 MG tablet Commonly known as:  MICARDIS HCT Take 1 tablet by mouth daily.   Vitamin D 2000 units Caps Take by mouth every morning.   XIFAXAN 200 MG tablet Generic drug:  rifaximin one tablet three times a day for three days for traveler's diarrhea       Allergies: No Known Allergies  Past Medical History, Surgical history, Social history, and Family History were reviewed and updated.  Review of Systems: Review of Systems  Constitutional: Negative.   HENT: Negative.   Eyes: Negative.   Respiratory: Negative.   Cardiovascular: Negative.   Gastrointestinal: Negative.   Genitourinary:  Negative.   Musculoskeletal: Negative.   Skin: Negative.   Neurological: Negative.   Endo/Heme/Allergies: Negative.   Psychiatric/Behavioral: Negative.      Physical Exam:  weight is 198 lb (89.8 kg). His oral temperature is 98.8 F (37.1 C). His blood pressure is 117/56 (abnormal) and his pulse is 73. His respiration is 18 and oxygen saturation is 97%.   Wt Readings from Last 3 Encounters:  12/05/17 198 lb (89.8 kg)  11/14/17 200 lb (90.7 kg)  09/08/17 213 lb (96.6 kg)    Physical Exam  Constitutional: He is oriented to person, place, and time.  HENT:  Head: Normocephalic and atraumatic.  Mouth/Throat: Oropharynx is clear and moist.  Eyes: Pupils are equal, round, and reactive to light. EOM are normal.  Neck: Normal range of motion.  Cardiovascular: Normal rate, regular rhythm and normal heart sounds.  Pulmonary/Chest: Effort normal and breath sounds normal.  Abdominal: Soft. Bowel sounds are normal.  Musculoskeletal: Normal range of motion. He exhibits no edema, tenderness or deformity.  Lymphadenopathy:    He has no cervical adenopathy.  Neurological: He is alert and oriented to person, place, and time.  Skin: Skin is warm and dry. No rash noted. No erythema.  Psychiatric: He has a normal mood and affect. His behavior is normal. Judgment and thought content normal.  Vitals reviewed.    Lab Results  Component Value Date   WBC 4.9 12/05/2017   HGB 14.6 12/05/2017   HCT 43.8 12/05/2017   MCV 104.3 (H) 12/05/2017   PLT 147 12/05/2017   No results found for: FERRITIN, IRON, TIBC, UIBC, IRONPCTSAT Lab Results  Component Value Date   RBC 4.20 12/05/2017   Lab Results  Component Value Date   KPAFRELGTCHN 57.2 (H) 11/14/2017   LAMBDASER 1.8 (L) 11/14/2017   KAPLAMBRATIO 31.78 (H) 11/14/2017   Lab Results  Component Value Date   IGGSERUM 441 (L) 11/14/2017   IGA 34 (L) 11/14/2017   IGMSERUM 13 (L) 11/14/2017   Lab Results  Component Value Date   TOTALPROTELP  5.2 (L) 11/14/2017   ALBUMINELP 3.0 11/14/2017   A1GS 0.3 11/14/2017   A2GS 0.7 11/14/2017   BETS 0.8 11/14/2017   BETA2SER 0.3 04/19/2015   GAMS 0.5 11/14/2017   MSPIKE Not Observed 11/14/2017   SPEI * 04/19/2015     Chemistry      Component Value Date/Time   NA 138 12/05/2017 0830   NA 142 04/23/2017 1431   NA 139 07/18/2016 1135   K 4.5 12/05/2017 0830   K 4.0 04/23/2017 1431   K 4.4 07/18/2016 1135   CL 104 12/05/2017 0830   CL 105 04/23/2017 1431   CO2 30 12/05/2017 0830   CO2 28 04/23/2017 1431   CO2 27 07/18/2016  1135   BUN 13 12/05/2017 0830   BUN 20 04/23/2017 1431   BUN 12.2 07/18/2016 1135   CREATININE 0.90 12/05/2017 0830   CREATININE 1.2 04/23/2017 1431   CREATININE 0.9 07/18/2016 1135      Component Value Date/Time   CALCIUM 8.7 12/05/2017 0830   CALCIUM 9.3 04/23/2017 1431   CALCIUM 8.9 07/18/2016 1135   ALKPHOS 56 12/05/2017 0830   ALKPHOS 64 04/23/2017 1431   ALKPHOS 58 07/18/2016 1135   AST 24 12/05/2017 0830   AST 15 07/18/2016 1135   ALT 26 12/05/2017 0830   ALT 22 04/23/2017 1431   ALT 20 07/18/2016 1135   BILITOT 1.3 12/05/2017 0830   BILITOT 1.24 (H) 07/18/2016 1135      Impression and Plan: Mr. Malecha is a very pleasant 77 yo caucasian gentleman with 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   We will see how he does with Kyprolis.  We will give him a little IV fluid before his Kyprolis is given.  We will go slow with the dosing advancement.  He is already on Singulair.  We will plan to get him back in 1 week just so we can see how he is doing.   [name], MD 7/12/201910:38 AM

## 2017-12-06 LAB — IGG, IGA, IGM
IGM (IMMUNOGLOBULIN M), SRM: 13 mg/dL — AB (ref 15–143)
IgA: 50 mg/dL — ABNORMAL LOW (ref 61–437)
IgG (Immunoglobin G), Serum: 517 mg/dL — ABNORMAL LOW (ref 700–1600)

## 2017-12-08 ENCOUNTER — Encounter: Payer: Self-pay | Admitting: Hematology & Oncology

## 2017-12-08 DIAGNOSIS — Z85828 Personal history of other malignant neoplasm of skin: Secondary | ICD-10-CM | POA: Diagnosis not present

## 2017-12-08 DIAGNOSIS — C44212 Basal cell carcinoma of skin of right ear and external auricular canal: Secondary | ICD-10-CM | POA: Diagnosis not present

## 2017-12-08 LAB — PROTEIN ELECTROPHORESIS, SERUM
A/G RATIO SPE: 1.5 (ref 0.7–1.7)
ALBUMIN ELP: 3.2 g/dL (ref 2.9–4.4)
Alpha-1-Globulin: 0.2 g/dL (ref 0.0–0.4)
Alpha-2-Globulin: 0.7 g/dL (ref 0.4–1.0)
Beta Globulin: 0.8 g/dL (ref 0.7–1.3)
GAMMA GLOBULIN: 0.5 g/dL (ref 0.4–1.8)
Globulin, Total: 2.2 g/dL (ref 2.2–3.9)
TOTAL PROTEIN ELP: 5.4 g/dL — AB (ref 6.0–8.5)

## 2017-12-08 LAB — KAPPA/LAMBDA LIGHT CHAINS
KAPPA FREE LGHT CHN: 92.7 mg/L — AB (ref 3.3–19.4)
KAPPA, LAMDA LIGHT CHAIN RATIO: 38.63 — AB (ref 0.26–1.65)
Lambda free light chains: 2.4 mg/L — ABNORMAL LOW (ref 5.7–26.3)

## 2017-12-09 ENCOUNTER — Telehealth: Payer: Self-pay | Admitting: Oncology

## 2017-12-09 DIAGNOSIS — C44212 Basal cell carcinoma of skin of right ear and external auricular canal: Secondary | ICD-10-CM | POA: Diagnosis not present

## 2017-12-09 NOTE — Telephone Encounter (Signed)
Patient states he did well with his chemotherapy administered on December 05, 2017. Patient states he had a little nausea on the evening of December 05, 2017 which was resolved with compazine. Patient verbalized understanding to call our office with any further questions of concerns.

## 2017-12-10 DIAGNOSIS — H6123 Impacted cerumen, bilateral: Secondary | ICD-10-CM | POA: Diagnosis not present

## 2017-12-12 ENCOUNTER — Inpatient Hospital Stay (HOSPITAL_BASED_OUTPATIENT_CLINIC_OR_DEPARTMENT_OTHER): Payer: Medicare Other | Admitting: Hematology & Oncology

## 2017-12-12 ENCOUNTER — Other Ambulatory Visit: Payer: Self-pay

## 2017-12-12 ENCOUNTER — Inpatient Hospital Stay: Payer: Medicare Other

## 2017-12-12 VITALS — BP 112/58 | HR 75 | Temp 98.8°F | Resp 18 | Wt 200.0 lb

## 2017-12-12 DIAGNOSIS — C9000 Multiple myeloma not having achieved remission: Secondary | ICD-10-CM

## 2017-12-12 DIAGNOSIS — C9002 Multiple myeloma in relapse: Secondary | ICD-10-CM

## 2017-12-12 DIAGNOSIS — Z5112 Encounter for antineoplastic immunotherapy: Secondary | ICD-10-CM | POA: Diagnosis not present

## 2017-12-12 LAB — CBC WITH DIFFERENTIAL (CANCER CENTER ONLY)
BASOS ABS: 0 10*3/uL (ref 0.0–0.1)
BASOS PCT: 0 %
EOS ABS: 0.1 10*3/uL (ref 0.0–0.5)
Eosinophils Relative: 2 %
HCT: 44.5 % (ref 38.7–49.9)
HEMOGLOBIN: 14.8 g/dL (ref 13.0–17.1)
Lymphocytes Relative: 33 %
Lymphs Abs: 1.8 10*3/uL (ref 0.9–3.3)
MCH: 35.2 pg — ABNORMAL HIGH (ref 28.0–33.4)
MCHC: 33.3 g/dL (ref 32.0–35.9)
MCV: 105.7 fL — ABNORMAL HIGH (ref 82.0–98.0)
MONOS PCT: 14 %
Monocytes Absolute: 0.8 10*3/uL (ref 0.1–0.9)
NEUTROS ABS: 2.9 10*3/uL (ref 1.5–6.5)
NEUTROS PCT: 51 %
Platelet Count: 136 10*3/uL — ABNORMAL LOW (ref 145–400)
RBC: 4.21 MIL/uL (ref 4.20–5.70)
RDW: 14.3 % (ref 11.1–15.7)
WBC: 5.6 10*3/uL (ref 4.0–10.0)

## 2017-12-12 LAB — CMP (CANCER CENTER ONLY)
ALT: 29 U/L (ref 10–47)
ANION GAP: 5 (ref 5–15)
AST: 23 U/L (ref 11–38)
Albumin: 3.3 g/dL — ABNORMAL LOW (ref 3.5–5.0)
Alkaline Phosphatase: 49 U/L (ref 26–84)
BUN: 13 mg/dL (ref 7–22)
CALCIUM: 8.7 mg/dL (ref 8.0–10.3)
CO2: 30 mmol/L (ref 18–33)
CREATININE: 1 mg/dL (ref 0.60–1.20)
Chloride: 104 mmol/L (ref 98–108)
GLUCOSE: 146 mg/dL — AB (ref 73–118)
Potassium: 4.6 mmol/L (ref 3.3–4.7)
SODIUM: 139 mmol/L (ref 128–145)
TOTAL PROTEIN: 5.7 g/dL — AB (ref 6.4–8.1)
Total Bilirubin: 1.2 mg/dL (ref 0.2–1.6)

## 2017-12-12 MED ORDER — DEXTROSE 5 % IV SOLN
46.9000 mg/m2 | Freq: Once | INTRAVENOUS | Status: AC
  Administered 2017-12-12: 100 mg via INTRAVENOUS
  Filled 2017-12-12: qty 30

## 2017-12-12 MED ORDER — PROCHLORPERAZINE MALEATE 10 MG PO TABS
ORAL_TABLET | ORAL | Status: AC
  Filled 2017-12-12: qty 1

## 2017-12-12 MED ORDER — PROCHLORPERAZINE MALEATE 10 MG PO TABS
10.0000 mg | ORAL_TABLET | Freq: Once | ORAL | Status: AC
  Administered 2017-12-12: 10 mg via ORAL

## 2017-12-12 MED ORDER — SODIUM CHLORIDE 0.9 % IV SOLN
Freq: Once | INTRAVENOUS | Status: AC
  Administered 2017-12-12: 13:00:00 via INTRAVENOUS

## 2017-12-12 MED ORDER — SODIUM CHLORIDE 0.9 % IV SOLN: Freq: Once | INTRAVENOUS | Status: AC

## 2017-12-12 NOTE — Progress Notes (Signed)
Hematology and Oncology Follow Up Visit  Rickey [name] [id number] 15-Jul-1940 77 y.o. 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  He looks a lot better.  He sounds a whole lot better.  This episode of pertussis that he has has resolved.  He has responded incredibly well to Kyprolis.  When we checked his light chain level couple weeks ago, his Kappa Lightchain was already down to 52 mg/L.  Prior to treatment he was 265 mg/L.  We did do an echocardiogram on him.  The echocardiogram showed ejection fraction of 55-60%.  There is no obvious valvular issues.  He and his family had a great time [place] at the beach.  His blood sugars are doing quite well.  He wants to get his Kyprolis in our office.  He and his wife like our nurses.  It is not that difficult for them to come down to our office.  Hopefully, we will be able to move his appointments out a little bit further as he responds.   Overall, his performance status is ECOG 1.   Medications:  Allergies as of 12/12/2017   No Known Allergies     Medication List        Accurate as of 12/12/17  1:16 PM. Always use your most recent med list.          acyclovir 400 MG tablet Commonly known as:  ZOVIRAX TAKE 1 TABLET(400 MG) BY MOUTH TWICE DAILY. 90 day supply   amLODipine 2.5 MG tablet Commonly known as:  NORVASC Take 2.5 mg by mouth daily.   aspirin 81 MG tablet Take 81 mg by mouth daily.   atorvastatin 10 MG tablet Commonly known as:  LIPITOR Take 10 mg by mouth daily.   calcium-vitamin D 250-125 MG-UNIT tablet Commonly known as:  OSCAL Take 1 tablet by mouth daily.   carfilzomib 60 MG Solr Commonly known as:   KYPROLIS Inject into the vein.   cycloSPORINE 0.05 % ophthalmic emulsion Commonly known as:  RESTASIS 1 drop 2 (two) times daily.   DOCOSAHEXAENOIC ACID PO Take by mouth.   fish oil-omega-3 fatty acids 1000 MG capsule Take 2 g by mouth 2 (two) times daily.   fluticasone 50 MCG/ACT nasal spray Commonly known as:  FLONASE   INSULIN LISP & LISP PROT (HUM) [place]   insulin lispro 100 UNIT/ML injection Commonly known as:  HUMALOG Inject into the skin. Insulin pump   irbesartan-hydrochlorothiazide 300-12.5 MG tablet Commonly known as:  AVALIDE   ixazomib citrate 4 MG capsule Commonly known as:  NINLARO Take 1 capsule weekly for 3 weeks and 1 week off.Take on an empty stomach 1hr before or 2hrs after food. Do not crush, chew or open.   LORazepam 0.5 MG tablet Commonly known as:  ATIVAN Take 1 tablet (0.5 mg total) by mouth every 6 (six) hours as needed (Nausea or vomiting).   montelukast 10 MG tablet Commonly known as:  SINGULAIR Take 10 mg by mouth at bedtime.   ondansetron 4 MG disintegrating tablet Commonly known as:  ZOFRAN-ODT SMARTSIG:1 Tablet(s) Sublingual 4 Times Daily PRN   ondansetron 8 MG tablet Commonly known as:  ZOFRAN Take 1 tablet (8 mg  total) by mouth 2 (two) times daily as needed (Nausea or vomiting).   pomalidomide 4 MG capsule Commonly known as:  POMALYST Take 1 capsule (4 mg total) by mouth daily. Take with water on days 1-21. Repeat every 28 days. [id number]   prochlorperazine 10 MG tablet Commonly known as:  COMPAZINE TAKE 1 TABLET(10 MG) BY MOUTH EVERY 6 HOURS AS NEEDED FOR NAUSEA OR VOMITING   telmisartan-hydrochlorothiazide 80-12.5 MG tablet Commonly known as:  MICARDIS HCT Take 1 tablet by mouth daily.   Vitamin D 2000 units Caps Take by mouth every morning.   XIFAXAN 200 MG tablet Generic drug:  rifaximin one tablet three times a day for three days for traveler's diarrhea       Allergies: No Known Allergies  Past Medical History,  Surgical history, Social history, and Family History were reviewed and updated.  Review of Systems: Review of Systems  Constitutional: Negative.   HENT: Negative.   Eyes: Negative.   Respiratory: Negative.   Cardiovascular: Negative.   Gastrointestinal: Negative.   Genitourinary: Negative.   Musculoskeletal: Negative.   Skin: Negative.   Neurological: Negative.   Endo/Heme/Allergies: Negative.   Psychiatric/Behavioral: Negative.      Physical Exam:  weight is 200 lb (90.7 kg). His oral temperature is 98.8 F (37.1 C). His blood pressure is 112/58 (abnormal) and his pulse is 75. His respiration is 18 and oxygen saturation is 99%.   Wt Readings from Last 3 Encounters:  12/12/17 200 lb (90.7 kg)  12/05/17 198 lb (89.8 kg)  11/14/17 200 lb (90.7 kg)    Physical Exam  Constitutional: He is oriented to person, place, and time.  HENT:  Head: Normocephalic and atraumatic.  Mouth/Throat: Oropharynx is clear and moist.  Eyes: Pupils are equal, round, and reactive to light. EOM are normal.  Neck: Normal range of motion.  Cardiovascular: Normal rate, regular rhythm and normal heart sounds.  Pulmonary/Chest: Effort normal and breath sounds normal.  Abdominal: Soft. Bowel sounds are normal.  Musculoskeletal: Normal range of motion. He exhibits no edema, tenderness or deformity.  Lymphadenopathy:    He has no cervical adenopathy.  Neurological: He is alert and oriented to person, place, and time.  Skin: Skin is warm and dry. No rash noted. No erythema.  Psychiatric: He has a normal mood and affect. His behavior is normal. Judgment and thought content normal.  Vitals reviewed.    Lab Results  Component Value Date   WBC 5.6 12/12/2017   HGB 14.8 12/12/2017   HCT 44.5 12/12/2017   MCV 105.7 (H) 12/12/2017   PLT 136 (L) 12/12/2017   No results found for: FERRITIN, IRON, TIBC, UIBC, IRONPCTSAT Lab Results  Component Value Date   RBC 4.21 12/12/2017   Lab Results  Component  Value Date   KPAFRELGTCHN 92.7 (H) 12/05/2017   LAMBDASER 2.4 (L) 12/05/2017   KAPLAMBRATIO 38.63 (H) 12/05/2017   Lab Results  Component Value Date   IGGSERUM 517 (L) 12/05/2017   IGA 50 (L) 12/05/2017   IGMSERUM 13 (L) 12/05/2017   Lab Results  Component Value Date   TOTALPROTELP 5.4 (L) 12/05/2017   ALBUMINELP 3.2 12/05/2017   A1GS 0.2 12/05/2017   A2GS 0.7 12/05/2017   BETS 0.8 12/05/2017   BETA2SER 0.3 04/19/2015   GAMS 0.5 12/05/2017   MSPIKE Not Observed 12/05/2017   SPEI Comment 12/05/2017     Chemistry      Component Value Date/Time   NA 139 12/12/2017 1140   NA 142 04/23/2017  1431   NA 139 07/18/2016 1135   K 4.6 12/12/2017 1140   K 4.0 04/23/2017 1431   K 4.4 07/18/2016 1135   CL 104 12/12/2017 1140   CL 105 04/23/2017 1431   CO2 30 12/12/2017 1140   CO2 28 04/23/2017 1431   CO2 27 07/18/2016 1135   BUN 13 12/12/2017 1140   BUN 20 04/23/2017 1431   BUN 12.2 07/18/2016 1135   CREATININE 1.00 12/12/2017 1140   CREATININE 1.2 04/23/2017 1431   CREATININE 0.9 07/18/2016 1135      Component Value Date/Time   CALCIUM 8.7 12/12/2017 1140   CALCIUM 9.3 04/23/2017 1431   CALCIUM 8.9 07/18/2016 1135   ALKPHOS 49 12/12/2017 1140   ALKPHOS 64 04/23/2017 1431   ALKPHOS 58 07/18/2016 1135   AST 23 12/12/2017 1140   AST 15 07/18/2016 1135   ALT 29 12/12/2017 1140   ALT 22 04/23/2017 1431   ALT 20 07/18/2016 1135   BILITOT 1.2 12/12/2017 1140   BILITOT 1.24 (H) 07/18/2016 1135      Impression and Plan: Mr. 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   We will see how he does with Kyprolis.  We will give him a little IV fluid before his Kyprolis is given.  We will go slow with the dosing advancement.  He is already on Singulair.  We will plan to get him back in 1 week just so we can see how he is doing.   [name], MD 7/19/20191:16 PM

## 2017-12-12 NOTE — Patient Instructions (Signed)
Carfilzomib injection What is this medicine? CARFILZOMIB (kar FILZ oh mib) targets a specific protein within cancer cells and stops the cancer cells from growing. It is used to treat multiple myeloma. This medicine may be used for other purposes; ask your health care provider or pharmacist if you have questions. COMMON BRAND NAME(S): KYPROLIS What should I tell my health care provider before I take this medicine? They need to know if you have any of these conditions: -heart disease -history of blood clots -irregular heartbeat -kidney disease -liver disease -lung or breathing disease -an unusual or allergic reaction to carfilzomib, or other medicines, foods, dyes, or preservatives -pregnant or trying to get pregnant -breast-feeding How should I use this medicine? This medicine is for injection or infusion into a vein. It is given by a health care professional in a hospital or clinic setting. Talk to your pediatrician regarding the use of this medicine in children. Special care may be needed. Overdosage: If you think you have taken too much of this medicine contact a poison control center or emergency room at once. NOTE: This medicine is only for you. Do not share this medicine with others. What if I miss a dose? It is important not to miss your dose. Call your doctor or health care professional if you are unable to keep an appointment. What may interact with this medicine? Interactions are not expected. Give your health care provider a list of all the medicines, herbs, non-prescription drugs, or dietary supplements you use. Also tell them if you smoke, drink alcohol, or use illegal drugs. Some items may interact with your medicine. This list may not describe all possible interactions. Give your health care provider a list of all the medicines, herbs, non-prescription drugs, or dietary supplements you use. Also tell them if you smoke, drink alcohol, or use illegal drugs. Some items may  interact with your medicine. What should I watch for while using this medicine? Your condition will be monitored carefully while you are receiving this medicine. Report any side effects. Continue your course of treatment even though you feel ill unless your doctor tells you to stop. You may need blood work done while you are taking this medicine. Do not become pregnant while taking this medicine or for at least 30 days after stopping it. Women should inform their doctor if they wish to become pregnant or think they might be pregnant. There is a potential for serious side effects to an unborn child. Men should not father a child while taking this medicine and for 90 days after stopping it. Talk to your health care professional or pharmacist for more information. Do not breast-feed an infant while taking this medicine. Check with your doctor or health care professional if you get an attack of severe diarrhea, nausea and vomiting, or if you sweat a lot. The loss of too much body fluid can make it dangerous for you to take this medicine. You may get dizzy. Do not drive, use machinery, or do anything that needs mental alertness until you know how this medicine affects you. Do not stand or sit up quickly, especially if you are an older patient. This reduces the risk of dizzy or fainting spells. What side effects may I notice from receiving this medicine? Side effects that you should report to your doctor or health care professional as soon as possible: -allergic reactions like skin rash, itching or hives, swelling of the face, lips, or tongue -confusion -dizziness -feeling faint or lightheaded -fever or chills -  palpitations -seizures -signs and symptoms of bleeding such as bloody or black, tarry stools; red or dark-brown urine; spitting up blood or brown material that looks like coffee grounds; red spots on the skin; unusual bruising or bleeding including from the eye, gums, or nose -signs and symptoms of  a blood clot such as breathing problems; changes in vision; chest pain; severe, sudden headache; pain, swelling, warmth in the leg; trouble speaking; sudden numbness or weakness of the face, arm or leg -signs and symptoms of kidney injury like trouble passing urine or change in the amount of urine -signs and symptoms of liver injury like dark yellow or brown urine; general ill feeling or flu-like symptoms; light-colored stools; loss of appetite; nausea; right upper belly pain; unusually weak or tired; yellowing of the eyes or skin Side effects that usually do not require medical attention (report to your doctor or health care professional if they continue or are bothersome): -back pain -cough -diarrhea -headache -muscle cramps -vomiting This list may not describe all possible side effects. Call your doctor for medical advice about side effects. You may report side effects to FDA at 1-800-FDA-1088. Where should I keep my medicine? This drug is given in a hospital or clinic and will not be stored at home. NOTE: This sheet is a summary. It may not cover all possible information. If you have questions about this medicine, talk to your doctor, pharmacist, or health care provider.  2018 Elsevier/Gold Standard (2015-06-15 13:39:23)

## 2017-12-13 LAB — IGG, IGA, IGM
IGA: 44 mg/dL — AB (ref 61–437)
IGG (IMMUNOGLOBIN G), SERUM: 445 mg/dL — AB (ref 700–1600)
IGM (IMMUNOGLOBULIN M), SRM: 10 mg/dL — AB (ref 15–143)

## 2017-12-15 LAB — PROTEIN ELECTROPHORESIS, SERUM, WITH REFLEX
A/G Ratio: 1.7 (ref 0.7–1.7)
ALPHA-2-GLOBULIN: 0.6 g/dL (ref 0.4–1.0)
Albumin ELP: 3.4 g/dL (ref 2.9–4.4)
Alpha-1-Globulin: 0.2 g/dL (ref 0.0–0.4)
Beta Globulin: 0.7 g/dL (ref 0.7–1.3)
GAMMA GLOBULIN: 0.4 g/dL (ref 0.4–1.8)
GLOBULIN, TOTAL: 2 g/dL — AB (ref 2.2–3.9)
TOTAL PROTEIN ELP: 5.4 g/dL — AB (ref 6.0–8.5)

## 2017-12-15 LAB — KAPPA/LAMBDA LIGHT CHAINS
KAPPA FREE LGHT CHN: 110.1 mg/L — AB (ref 3.3–19.4)
Kappa, lambda light chain ratio: 47.87 — ABNORMAL HIGH (ref 0.26–1.65)
LAMDA FREE LIGHT CHAINS: 2.3 mg/L — AB (ref 5.7–26.3)

## 2017-12-16 ENCOUNTER — Other Ambulatory Visit: Payer: Self-pay | Admitting: *Deleted

## 2017-12-16 DIAGNOSIS — C9000 Multiple myeloma not having achieved remission: Secondary | ICD-10-CM

## 2017-12-16 DIAGNOSIS — S91332A Puncture wound without foreign body, left foot, initial encounter: Secondary | ICD-10-CM

## 2017-12-16 MED ORDER — POMALIDOMIDE 4 MG PO CAPS: 4.0000 mg | ORAL_CAPSULE | Freq: Every day | ORAL | 0 refills | Status: DC

## 2017-12-17 ENCOUNTER — Other Ambulatory Visit: Payer: Self-pay | Admitting: *Deleted

## 2017-12-17 ENCOUNTER — Other Ambulatory Visit (HOSPITAL_BASED_OUTPATIENT_CLINIC_OR_DEPARTMENT_OTHER): Payer: Medicare Other

## 2017-12-19 ENCOUNTER — Other Ambulatory Visit: Payer: Self-pay | Admitting: *Deleted

## 2017-12-19 ENCOUNTER — Ambulatory Visit: Payer: Medicare Other | Admitting: Hematology & Oncology

## 2017-12-19 ENCOUNTER — Ambulatory Visit: Payer: Medicare Other

## 2017-12-19 ENCOUNTER — Other Ambulatory Visit: Payer: Medicare Other

## 2017-12-22 ENCOUNTER — Encounter: Payer: Self-pay | Admitting: Hematology & Oncology

## 2017-12-22 ENCOUNTER — Inpatient Hospital Stay (HOSPITAL_BASED_OUTPATIENT_CLINIC_OR_DEPARTMENT_OTHER): Payer: Medicare Other | Admitting: Hematology & Oncology

## 2017-12-22 ENCOUNTER — Inpatient Hospital Stay: Payer: Medicare Other

## 2017-12-22 ENCOUNTER — Other Ambulatory Visit: Payer: Self-pay

## 2017-12-22 VITALS — BP 120/52 | HR 56 | Temp 97.4°F | Resp 18 | Wt 199.0 lb

## 2017-12-22 DIAGNOSIS — C9000 Multiple myeloma not having achieved remission: Secondary | ICD-10-CM

## 2017-12-22 DIAGNOSIS — C9002 Multiple myeloma in relapse: Secondary | ICD-10-CM

## 2017-12-22 DIAGNOSIS — Z5112 Encounter for antineoplastic immunotherapy: Secondary | ICD-10-CM | POA: Diagnosis not present

## 2017-12-22 LAB — CMP (CANCER CENTER ONLY)
ALT: 28 U/L (ref 10–47)
AST: 21 U/L (ref 11–38)
Albumin: 3.3 g/dL — ABNORMAL LOW (ref 3.5–5.0)
Alkaline Phosphatase: 40 U/L (ref 26–84)
Anion gap: 6 (ref 5–15)
BUN: 16 mg/dL (ref 7–22)
CALCIUM: 8.6 mg/dL (ref 8.0–10.3)
CO2: 29 mmol/L (ref 18–33)
CREATININE: 1.2 mg/dL (ref 0.60–1.20)
Chloride: 106 mmol/L (ref 98–108)
GLUCOSE: 192 mg/dL — AB (ref 73–118)
Potassium: 5.2 mmol/L — ABNORMAL HIGH (ref 3.3–4.7)
SODIUM: 141 mmol/L (ref 128–145)
TOTAL PROTEIN: 5.7 g/dL — AB (ref 6.4–8.1)
Total Bilirubin: 1.3 mg/dL (ref 0.2–1.6)

## 2017-12-22 LAB — CBC WITH DIFFERENTIAL (CANCER CENTER ONLY)
BASOS ABS: 0 10*3/uL (ref 0.0–0.1)
Basophils Relative: 1 %
EOS ABS: 0.2 10*3/uL (ref 0.0–0.5)
EOS PCT: 3 %
HCT: 42.3 % (ref 38.7–49.9)
HEMOGLOBIN: 14.6 g/dL (ref 13.0–17.1)
LYMPHS PCT: 32 %
Lymphs Abs: 1.6 10*3/uL (ref 0.9–3.3)
MCH: 35.8 pg — ABNORMAL HIGH (ref 28.0–33.4)
MCHC: 34.5 g/dL (ref 32.0–35.9)
MCV: 103.7 fL — ABNORMAL HIGH (ref 82.0–98.0)
Monocytes Absolute: 0.9 10*3/uL (ref 0.1–0.9)
Monocytes Relative: 18 %
NEUTROS PCT: 46 %
Neutro Abs: 2.4 10*3/uL (ref 1.5–6.5)
PLATELETS: 138 10*3/uL — AB (ref 145–400)
RBC: 4.08 MIL/uL — AB (ref 4.20–5.70)
RDW: 14.1 % (ref 11.1–15.7)
WBC: 5.2 10*3/uL (ref 4.0–10.0)

## 2017-12-22 MED ORDER — PROCHLORPERAZINE MALEATE 10 MG PO TABS
ORAL_TABLET | ORAL | Status: AC
  Filled 2017-12-22: qty 1

## 2017-12-22 MED ORDER — DEXTROSE 5 % IV SOLN
70.0000 mg/m2 | Freq: Once | INTRAVENOUS | Status: AC
  Administered 2017-12-22: 150 mg via INTRAVENOUS
  Filled 2017-12-22: qty 60

## 2017-12-22 MED ORDER — SODIUM CHLORIDE 0.9 % IV SOLN
Freq: Once | INTRAVENOUS | Status: AC
  Administered 2017-12-22: 14:00:00 via INTRAVENOUS
  Filled 2017-12-22: qty 250

## 2017-12-22 MED ORDER — SODIUM CHLORIDE 0.9 % IV SOLN
Freq: Once | INTRAVENOUS | Status: AC
  Filled 2017-12-22: qty 250

## 2017-12-22 MED ORDER — PROCHLORPERAZINE MALEATE 10 MG PO TABS
10.0000 mg | ORAL_TABLET | Freq: Once | ORAL | Status: AC
  Administered 2017-12-22: 10 mg via ORAL

## 2017-12-22 NOTE — Progress Notes (Signed)
Hematology and Oncology Follow Up Visit  EHAB HUMBER [id number] [birth year] 77 y.o. 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 Carreira is here today with his wife for follow-up.  He is doing okay.  [place]  He has done well so far with the Kyprolis.  He has had a reduced dose so far.  We have been increasing his dose gradually.  He has had a rise in his Kappa Lightchain which is a little surprising.  We will recheck his Kappa Lightchain with his last visit, light chain was 4.8 mg/dL.  I was a little surprised by this.  I am going to put him on full dose Kyprolis now.  He gets IV fluids before the Kyprolis.  His blood sugars have been doing okay.  He really watches what he eats very carefully.  He is quite diligent in checking his blood sugars.  He has had no issues with cough.  He has had no fever.  There is been no shortness of breath.  We did do an echocardiogram on him before his treatments.  His echocardiogram was done on July 10.  He had a good ejection fraction of 55-60%.  He is doing okay on the Pomalidomide.  He has had no rashes.  There is been no diarrhea.  Overall, his performance status is ECOG 1.   Medications:  Allergies as of 12/22/2017   No Known Allergies     Medication List        Accurate as of 12/22/17  2:13 PM. Always use your most recent med list.          acyclovir 400 MG tablet Commonly known as:  ZOVIRAX TAKE 1 TABLET(400 MG) BY MOUTH TWICE DAILY. 90 day supply   amLODipine 2.5 MG tablet Commonly known as:  NORVASC Take 2.5 mg by mouth daily.   aspirin 81 MG tablet Take 81 mg by mouth daily.   atorvastatin 10 MG tablet Commonly known  as:  LIPITOR Take 10 mg by mouth daily.   calcium-vitamin D 250-125 MG-UNIT tablet Commonly known as:  OSCAL Take 1 tablet by mouth daily.   carfilzomib 60 MG Solr Commonly known as:  KYPROLIS Inject into the vein.   cycloSPORINE 0.05 % ophthalmic emulsion Commonly known as:  RESTASIS 1 drop 2 (two) times daily.   DOCOSAHEXAENOIC ACID PO Take by mouth.   fish oil-omega-3 fatty acids 1000 MG capsule Take 2 g by mouth 2 (two) times daily.   fluticasone 50 MCG/ACT nasal spray Commonly known as:  FLONASE   INSULIN LISP & LISP PROT (HUM) [place]   insulin lispro 100 UNIT/ML injection Commonly known as:  HUMALOG Inject into the skin. Insulin pump   irbesartan-hydrochlorothiazide 300-12.5 MG tablet Commonly known as:  AVALIDE   ixazomib citrate 4 MG capsule Commonly known as:  NINLARO Take 1 capsule weekly for 3 weeks and 1 week off.Take on an empty stomach 1hr before or 2hrs after food. Do not crush, chew or open.   LORazepam 0.5 MG tablet Commonly known as:  ATIVAN Take 1 tablet (0.5 mg total) by mouth every 6 (six) hours as needed (Nausea or vomiting).   montelukast 10 MG  tablet Commonly known as:  SINGULAIR Take 10 mg by mouth at bedtime.   ondansetron 4 MG disintegrating tablet Commonly known as:  ZOFRAN-ODT SMARTSIG:1 Tablet(s) Sublingual 4 Times Daily PRN   ondansetron 8 MG tablet Commonly known as:  ZOFRAN Take 1 tablet (8 mg total) by mouth 2 (two) times daily as needed (Nausea or vomiting).   pomalidomide 4 MG capsule Commonly known as:  POMALYST Take 1 capsule (4 mg total) by mouth daily. Take with water on days 1-21. Repeat every 28 days. [id number]   prochlorperazine 10 MG tablet Commonly known as:  COMPAZINE TAKE 1 TABLET(10 MG) BY MOUTH EVERY 6 HOURS AS NEEDED FOR NAUSEA OR VOMITING   telmisartan-hydrochlorothiazide 80-12.5 MG tablet Commonly known as:  MICARDIS HCT Take 1 tablet by mouth daily.   Vitamin D 2000 units Caps Take by mouth every  morning.   XIFAXAN 200 MG tablet Generic drug:  rifaximin one tablet three times a day for three days for traveler's diarrhea       Allergies: No Known Allergies  Past Medical History, Surgical history, Social history, and Family History were reviewed and updated.  Review of Systems: Review of Systems  Constitutional: Negative.   HENT: Negative.   Eyes: Negative.   Respiratory: Negative.   Cardiovascular: Negative.   Gastrointestinal: Negative.   Genitourinary: Negative.   Musculoskeletal: Negative.   Skin: Negative.   Neurological: Negative.   Endo/Heme/Allergies: Negative.   Psychiatric/Behavioral: Negative.      Physical Exam:  weight is 199 lb (90.3 kg). His oral temperature is 97.4 F (36.3 C) (abnormal). His blood pressure is 120/52 (abnormal) and his pulse is 56 (abnormal). His respiration is 18 and oxygen saturation is 100%.   Wt Readings from Last 3 Encounters:  12/22/17 199 lb (90.3 kg)  12/12/17 200 lb (90.7 kg)  12/05/17 198 lb (89.8 kg)    Physical Exam  Constitutional: He is oriented to person, place, and time.  HENT:  Head: Normocephalic and atraumatic.  Mouth/Throat: Oropharynx is clear and moist.  Eyes: Pupils are equal, round, and reactive to light. EOM are normal.  Neck: Normal range of motion.  Cardiovascular: Normal rate, regular rhythm and normal heart sounds.  Pulmonary/Chest: Effort normal and breath sounds normal.  Abdominal: Soft. Bowel sounds are normal.  Musculoskeletal: Normal range of motion. He exhibits no edema, tenderness or deformity.  Lymphadenopathy:    He has no cervical adenopathy.  Neurological: He is alert and oriented to person, place, and time.  Skin: Skin is warm and dry. No rash noted. No erythema.  Psychiatric: He has a normal mood and affect. His behavior is normal. Judgment and thought content normal.  Vitals reviewed.    Lab Results  Component Value Date   WBC 5.2 12/22/2017   HGB 14.6 12/22/2017   HCT  42.3 12/22/2017   MCV 103.7 (H) 12/22/2017   PLT 138 (L) 12/22/2017   No results found for: FERRITIN, IRON, TIBC, UIBC, IRONPCTSAT Lab Results  Component Value Date   RBC 4.08 (L) 12/22/2017   Lab Results  Component Value Date   KPAFRELGTCHN 110.1 (H) 12/12/2017   LAMBDASER 2.3 (L) 12/12/2017   KAPLAMBRATIO 47.87 (H) 12/12/2017   Lab Results  Component Value Date   IGGSERUM 445 (L) 12/12/2017   IGA 44 (L) 12/12/2017   IGMSERUM 10 (L) 12/12/2017   Lab Results  Component Value Date   TOTALPROTELP 5.4 (L) 12/12/2017   ALBUMINELP 3.4 12/12/2017   A1GS 0.2 12/12/2017   A2GS 0.6  12/12/2017   BETS 0.7 12/12/2017   BETA2SER 0.3 04/19/2015   GAMS 0.4 12/12/2017   MSPIKE Not Observed 12/12/2017   SPEI Comment 12/05/2017     Chemistry      Component Value Date/Time   NA 141 12/22/2017 1125   NA 142 04/23/2017 1431   NA 139 07/18/2016 1135   K 5.2 (H) 12/22/2017 1125   K 4.0 04/23/2017 1431   K 4.4 07/18/2016 1135   CL 106 12/22/2017 1125   CL 105 04/23/2017 1431   CO2 29 12/22/2017 1125   CO2 28 04/23/2017 1431   CO2 27 07/18/2016 1135   BUN 16 12/22/2017 1125   BUN 20 04/23/2017 1431   BUN 12.2 07/18/2016 1135   CREATININE 1.20 12/22/2017 1125   CREATININE 1.2 04/23/2017 1431   CREATININE 0.9 07/18/2016 1135      Component Value Date/Time   CALCIUM 8.6 12/22/2017 1125   CALCIUM 9.3 04/23/2017 1431   CALCIUM 8.9 07/18/2016 1135   ALKPHOS 40 12/22/2017 1125   ALKPHOS 64 04/23/2017 1431   ALKPHOS 58 07/18/2016 1135   AST 21 12/22/2017 1125   AST 15 07/18/2016 1135   ALT 28 12/22/2017 1125   ALT 22 04/23/2017 1431   ALT 20 07/18/2016 1135   BILITOT 1.3 12/22/2017 1125   BILITOT 1.24 (H) 07/18/2016 1135      Impression and Plan: Mr. Sherrow is a very pleasant 77 yo caucasian gentleman with 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   Hopefully, the full dose Kyprolis will be able to get him back into a responding state.  I am looking forward  to seeing what his light chain levels are in the future.  We will have to follow him closely.  He now will be off 2 weeks before he starts his next cycle of Kyprolis.  We will see him back when he returns in 2 weeks.    [name], MD 7/29/20192:13 PM

## 2017-12-22 NOTE — Patient Instructions (Signed)
Cancer Center Discharge Instructions for Patients Receiving Chemotherapy  Today you received the following chemotherapy agents Kyprolis  To help prevent nausea and vomiting after your treatment, we encourage you to take your nausea medication    If you develop nausea and vomiting that is not controlled by your nausea medication, call the clinic.   BELOW ARE SYMPTOMS THAT SHOULD BE REPORTED IMMEDIATELY: *FEVER GREATER THAN 100.5 F *CHILLS WITH OR WITHOUT FEVER NAUSEA AND VOMITING THAT IS NOT CONTROLLED WITH YOUR NAUSEA MEDICATION *UNUSUAL SHORTNESS OF BREATH *UNUSUAL BRUISING OR BLEEDING TENDERNESS IN MOUTH AND THROAT WITH OR WITHOUT PRESENCE OF ULCERS *URINARY PROBLEMS *BOWEL PROBLEMS UNUSUAL RASH Items with * indicate a potential emergency and should be followed up as soon as possible.  Feel free to call the clinic should you have any questions or concerns. The clinic phone number is [phone].  Please show the CHEMO ALERT CARD at check-in to the Emergency Department and triage nurse.   

## 2017-12-29 ENCOUNTER — Telehealth: Payer: Self-pay

## 2017-12-29 NOTE — Telephone Encounter (Signed)
Received call from pt stating that he began on Wednesday having symptoms "just like when I had whooping cough back in May." Pt's PCP, Dr Dagmar Hait started pt on ZPak. Pt is calling to notify Dr Marin Olp of this information. No new orders at this time. dph

## 2017-12-30 ENCOUNTER — Other Ambulatory Visit: Payer: Self-pay | Admitting: *Deleted

## 2017-12-30 DIAGNOSIS — S91332A Puncture wound without foreign body, left foot, initial encounter: Secondary | ICD-10-CM

## 2017-12-30 DIAGNOSIS — C9000 Multiple myeloma not having achieved remission: Secondary | ICD-10-CM

## 2017-12-30 MED ORDER — POMALIDOMIDE 4 MG PO CAPS: 4.0000 mg | ORAL_CAPSULE | Freq: Every day | ORAL | 0 refills | Status: DC

## 2018-01-01 ENCOUNTER — Other Ambulatory Visit: Payer: Self-pay | Admitting: Family

## 2018-01-01 ENCOUNTER — Other Ambulatory Visit: Payer: Self-pay

## 2018-01-01 ENCOUNTER — Inpatient Hospital Stay: Payer: Medicare Other | Attending: Hematology & Oncology | Admitting: Hematology & Oncology

## 2018-01-01 ENCOUNTER — Inpatient Hospital Stay: Payer: Medicare Other

## 2018-01-01 ENCOUNTER — Encounter: Payer: Self-pay | Admitting: Hematology & Oncology

## 2018-01-01 ENCOUNTER — Other Ambulatory Visit: Payer: Self-pay | Admitting: *Deleted

## 2018-01-01 ENCOUNTER — Ambulatory Visit (HOSPITAL_BASED_OUTPATIENT_CLINIC_OR_DEPARTMENT_OTHER)
Admission: RE | Admit: 2018-01-01 | Discharge: 2018-01-01 | Disposition: A | Discharge status: Home / Self Care | Payer: Medicare Other | Source: Ambulatory Visit | Attending: Family | Admitting: Family

## 2018-01-01 VITALS — BP 113/51 | HR 69 | Temp 98.2°F | Resp 19 | Wt 191.0 lb

## 2018-01-01 DIAGNOSIS — C9000 Multiple myeloma not having achieved remission: Secondary | ICD-10-CM

## 2018-01-01 DIAGNOSIS — R059 Cough, unspecified: Secondary | ICD-10-CM

## 2018-01-01 DIAGNOSIS — C9002 Multiple myeloma in relapse: Secondary | ICD-10-CM | POA: Insufficient documentation

## 2018-01-01 DIAGNOSIS — R05 Cough: Secondary | ICD-10-CM

## 2018-01-01 DIAGNOSIS — J984 Other disorders of lung: Secondary | ICD-10-CM | POA: Diagnosis not present

## 2018-01-01 DIAGNOSIS — Z5112 Encounter for antineoplastic immunotherapy: Secondary | ICD-10-CM | POA: Insufficient documentation

## 2018-01-01 LAB — CMP (CANCER CENTER ONLY)
ALBUMIN: 3.4 g/dL — AB (ref 3.5–5.0)
ALT: 18 U/L (ref 10–47)
AST: 17 U/L (ref 11–38)
Alkaline Phosphatase: 46 U/L (ref 26–84)
Anion gap: 8 (ref 5–15)
BILIRUBIN TOTAL: 1.3 mg/dL (ref 0.2–1.6)
BUN: 17 mg/dL (ref 7–22)
CHLORIDE: 102 mmol/L (ref 98–108)
CO2: 29 mmol/L (ref 18–33)
CREATININE: 1.1 mg/dL (ref 0.60–1.20)
Calcium: 9.3 mg/dL (ref 8.0–10.3)
GLUCOSE: 147 mg/dL — AB (ref 73–118)
Potassium: 4.8 mmol/L — ABNORMAL HIGH (ref 3.3–4.7)
SODIUM: 139 mmol/L (ref 128–145)
Total Protein: 6.2 g/dL — ABNORMAL LOW (ref 6.4–8.1)

## 2018-01-01 LAB — CBC WITH DIFFERENTIAL (CANCER CENTER ONLY)
BASOS PCT: 1 %
Basophils Absolute: 0.1 10*3/uL (ref 0.0–0.1)
EOS ABS: 0 10*3/uL (ref 0.0–0.5)
EOS PCT: 1 %
HEMATOCRIT: 44.3 % (ref 38.7–49.9)
Hemoglobin: 15 g/dL (ref 13.0–17.1)
Lymphocytes Relative: 30 %
Lymphs Abs: 1.9 10*3/uL (ref 0.9–3.3)
MCH: 35.5 pg — ABNORMAL HIGH (ref 28.0–33.4)
MCHC: 33.9 g/dL (ref 32.0–35.9)
MCV: 105 fL — ABNORMAL HIGH (ref 82.0–98.0)
MONO ABS: 1.1 10*3/uL — AB (ref 0.1–0.9)
MONOS PCT: 18 %
Neutro Abs: 3 10*3/uL (ref 1.5–6.5)
Neutrophils Relative %: 50 %
PLATELETS: 194 10*3/uL (ref 145–400)
RBC: 4.22 MIL/uL (ref 4.20–5.70)
RDW: 13.7 % (ref 11.1–15.7)
WBC Count: 6.1 10*3/uL (ref 4.0–10.0)

## 2018-01-01 NOTE — Progress Notes (Signed)
Hematology and Oncology Follow Up Visit  Rickey [name] [id number] June 22, 1940 77 y.o. 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 Elotuzumab/Pomalidomide/decadron - start 01/12/2018    Interim History:  Rickey [name] is here today with his wife for unscheduled visit.  He had full dose Kyprolis last Monday.  2 days later, he began to have problems.  He began to have a cough.  He felt very weak and fatigued.  He felt like he had a "whooping cough again."  Unfortunately, I think it is apparent that he is not tolerant of full dose Kyprolis.  I think that full dose Kyprolis is what is going to be necessary to try to help his light chain myeloma.  We started him on reduced dose Kyprolis and found that his kappa light chains were going up.  On 719, his Kappa Lightchain was 4.8 mg/dL.  A month earlier, they were 3.2 mg/dL.  As such, I think we are going to have to make a change in his treatment protocol.  I think that elotuzimab with pomalidomide is a good idea.  He already is on pomalidomide.  Ultimately, Rickey [name] might be candidate for CAR-T therapy.  This I think would be very reasonable for him.  He had a chest x-ray today.  The chest x-ray did not show any infiltrate for pneumonia.  Had no obvious heart failure findings.  His labs also look okay.  He is not neutropenic.  His creatinine is okay.  He is feeling a little bit better today.  He has had no diarrhea although he had one episode of diarrhea for about 12 hours.  He had no hemoptysis.  There is no fever.  He had no mouth sores.  Currently, his performance status is ECOG 1.    Medications:  Allergies as of 01/01/2018   No Known Allergies     Medication List        Accurate as of 01/01/18  5:00 PM. Always use your most recent med list.          acyclovir 400 MG tablet Commonly known as:  ZOVIRAX TAKE 1 TABLET(400 MG) BY MOUTH TWICE DAILY. 90 day supply   amLODipine 2.5 MG tablet Commonly known as:  NORVASC Take 2.5 mg by mouth daily.   aspirin 81 MG tablet Take 81 mg by mouth daily.   atorvastatin 10 MG tablet Commonly known as:  LIPITOR Take 10 mg by mouth daily.   azithromycin 250 MG tablet Commonly known as:  ZITHROMAX Take 250 mg by mouth daily.   calcium-vitamin D 250-125 MG-UNIT tablet Commonly known as:  OSCAL Take 1 tablet by mouth daily.   carfilzomib 60 MG Solr Commonly known as:  KYPROLIS Inject into the vein.   cycloSPORINE 0.05 % ophthalmic emulsion Commonly known as:  RESTASIS 1 drop 2 (two) times daily.   DOCOSAHEXAENOIC ACID PO Take by mouth.   fish oil-omega-3 fatty acids 1000 MG capsule Take 2 g by mouth 2 (two) times daily.   fluticasone 50 MCG/ACT nasal spray Commonly known as:  FLONASE   INSULIN LISP & LISP PROT (HUM) [place]   insulin lispro 100 UNIT/ML injection Commonly known as:  HUMALOG Inject into the skin. Insulin  pump   irbesartan-hydrochlorothiazide 300-12.5 MG tablet Commonly known as:  AVALIDE   ixazomib citrate 4 MG capsule Commonly known as:  NINLARO Take 1 capsule weekly for 3 weeks and 1 week off.Take on an empty stomach 1hr before or 2hrs after food. Do not crush, chew or open.   montelukast 10 MG tablet Commonly known as:  SINGULAIR Take 10 mg by mouth at bedtime.   ondansetron 4 MG disintegrating tablet Commonly known as:  ZOFRAN-ODT SMARTSIG:1 Tablet(s) Sublingual 4 Times Daily PRN   pomalidomide 4 MG capsule Commonly known as:  POMALYST Take 1 capsule (4 mg total) by mouth daily. Take with water on days 1-21. Repeat every 28 days. [id number]   telmisartan-hydrochlorothiazide 80-12.5 MG tablet Commonly known as:  MICARDIS HCT Take 1 tablet by mouth daily.   Vitamin D  2000 units Caps Take by mouth every morning.   XIFAXAN 200 MG tablet Generic drug:  rifaximin one tablet three times a day for three days for traveler's diarrhea       Allergies: No Known Allergies  Past Medical History, Surgical history, Social history, and Family History were reviewed and updated.  Review of Systems: Review of Systems  Constitutional: Negative.   HENT: Negative.   Eyes: Negative.   Respiratory: Negative.   Cardiovascular: Negative.   Gastrointestinal: Negative.   Genitourinary: Negative.   Musculoskeletal: Negative.   Skin: Negative.   Neurological: Negative.   Endo/Heme/Allergies: Negative.   Psychiatric/Behavioral: Negative.      Physical Exam:  weight is 191 lb (86.6 kg). His oral temperature is 98.2 F (36.8 C). His blood pressure is 113/51 (abnormal) and his pulse is 69. His respiration is 19 and oxygen saturation is 98%.   Wt Readings from Last 3 Encounters:  01/01/18 191 lb (86.6 kg)  12/22/17 199 lb (90.3 kg)  12/12/17 200 lb (90.7 kg)    Physical Exam  Constitutional: He is oriented to person, place, and time.  HENT:  Head: Normocephalic and atraumatic.  Mouth/Throat: Oropharynx is clear and moist.  Eyes: Pupils are equal, round, and reactive to light. EOM are normal.  Neck: Normal range of motion.  Cardiovascular: Normal rate, regular rhythm and normal heart sounds.  Pulmonary/Chest: Effort normal and breath sounds normal.  Abdominal: Soft. Bowel sounds are normal.  Musculoskeletal: Normal range of motion. He exhibits no edema, tenderness or deformity.  Lymphadenopathy:    He has no cervical adenopathy.  Neurological: He is alert and oriented to person, place, and time.  Skin: Skin is warm and dry. No rash noted. No erythema.  Psychiatric: He has a normal mood and affect. His behavior is normal. Judgment and thought content normal.  Vitals reviewed.    Lab Results  Component Value Date   WBC 6.1 01/01/2018   HGB 15.0  01/01/2018   HCT 44.3 01/01/2018   MCV 105.0 (H) 01/01/2018   PLT 194 01/01/2018   No results found for: FERRITIN, IRON, TIBC, UIBC, IRONPCTSAT Lab Results  Component Value Date   RBC 4.22 01/01/2018   Lab Results  Component Value Date   KPAFRELGTCHN 110.1 (H) 12/12/2017   LAMBDASER 2.3 (L) 12/12/2017   KAPLAMBRATIO 47.87 (H) 12/12/2017   Lab Results  Component Value Date   IGGSERUM 445 (L) 12/12/2017   IGA 44 (L) 12/12/2017   IGMSERUM 10 (L) 12/12/2017   Lab Results  Component Value Date   TOTALPROTELP 5.4 (L) 12/12/2017   ALBUMINELP 3.4 12/12/2017   A1GS 0.2 12/12/2017   A2GS 0.6 12/12/2017  BETS 0.7 12/12/2017   BETA2SER 0.3 04/19/2015   GAMS 0.4 12/12/2017   MSPIKE Not Observed 12/12/2017   SPEI Comment 12/05/2017     Chemistry      Component Value Date/Time   NA 139 01/01/2018 1318   NA 142 04/23/2017 1431   NA 139 07/18/2016 1135   K 4.8 (H) 01/01/2018 1318   K 4.0 04/23/2017 1431   K 4.4 07/18/2016 1135   CL 102 01/01/2018 1318   CL 105 04/23/2017 1431   CO2 29 01/01/2018 1318   CO2 28 04/23/2017 1431   CO2 27 07/18/2016 1135   BUN 17 01/01/2018 1318   BUN 20 04/23/2017 1431   BUN 12.2 07/18/2016 1135   CREATININE 1.10 01/01/2018 1318   CREATININE 1.2 04/23/2017 1431   CREATININE 0.9 07/18/2016 1135      Component Value Date/Time   CALCIUM 9.3 01/01/2018 1318   CALCIUM 9.3 04/23/2017 1431   CALCIUM 8.9 07/18/2016 1135   ALKPHOS 46 01/01/2018 1318   ALKPHOS 64 04/23/2017 1431   ALKPHOS 58 07/18/2016 1135   AST 17 01/01/2018 1318   AST 15 07/18/2016 1135   ALT 18 01/01/2018 1318   ALT 22 04/23/2017 1431   ALT 20 07/18/2016 1135   BILITOT 1.3 01/01/2018 1318   BILITOT 1.24 (H) 07/18/2016 1135      Impression and Plan: Mr. 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   Again, we are going to make a change in his protocol.  I will switch him over to  elotuzimab/pomalidomide/Decadron (EPD).  I think this should be a reasonable protocol and hopefully effective protocol.  We will start this on August 19.  I spent about 50 minutes with he and his wife.  All the time was spent face-to-face.  I was reviewing his chest x-ray.  I reviewed his labs.  I talked to them about the chemotherapy change that we are making.  I gave him information about the elotuzimab.  I answered all their questions.  I counseled them and coordinated his future appointments.  I answered all their questions.  We will see him back on August 19.    [name], MD 8/8/20195:00 PM

## 2018-01-01 NOTE — Progress Notes (Signed)
START ON PATHWAY REGIMEN - Multiple Myeloma and Other Plasma Cell Dyscrasias     A cycle is every 28 days:     Dexamethasone      Dexamethasone      Elotuzumab      Pomalidomide      Dexamethasone      Dexamethasone      Dexamethasone      Elotuzumab   **Always confirm dose/schedule in your pharmacy ordering system**  Patient Characteristics: Relapsed / Refractory, All Lines of Therapy R-ISS Staging: Not Applicable Disease Classification: Relapsed Line of Therapy: Third Line Intent of Therapy: Non-Curative / Palliative Intent, Discussed with Patient

## 2018-01-02 ENCOUNTER — Ambulatory Visit: Payer: Medicare Other

## 2018-01-02 ENCOUNTER — Ambulatory Visit: Payer: Medicare Other | Admitting: Hematology & Oncology

## 2018-01-02 ENCOUNTER — Other Ambulatory Visit: Payer: Medicare Other

## 2018-01-05 ENCOUNTER — Ambulatory Visit: Payer: Medicare Other

## 2018-01-05 ENCOUNTER — Other Ambulatory Visit: Payer: Medicare Other

## 2018-01-05 ENCOUNTER — Ambulatory Visit: Payer: Medicare Other | Admitting: Hematology & Oncology

## 2018-01-09 ENCOUNTER — Ambulatory Visit: Payer: Medicare Other | Admitting: Hematology & Oncology

## 2018-01-09 ENCOUNTER — Other Ambulatory Visit: Payer: Medicare Other

## 2018-01-09 ENCOUNTER — Ambulatory Visit: Payer: Medicare Other

## 2018-01-12 ENCOUNTER — Inpatient Hospital Stay: Payer: Medicare Other

## 2018-01-12 ENCOUNTER — Inpatient Hospital Stay (HOSPITAL_BASED_OUTPATIENT_CLINIC_OR_DEPARTMENT_OTHER): Payer: Medicare Other | Admitting: Hematology & Oncology

## 2018-01-12 ENCOUNTER — Other Ambulatory Visit: Payer: Self-pay

## 2018-01-12 ENCOUNTER — Telehealth: Payer: Self-pay | Admitting: *Deleted

## 2018-01-12 ENCOUNTER — Encounter: Payer: Self-pay | Admitting: Hematology & Oncology

## 2018-01-12 VITALS — BP 109/56 | HR 69 | Temp 98.3°F | Resp 18 | Wt 195.5 lb

## 2018-01-12 DIAGNOSIS — C9002 Multiple myeloma in relapse: Secondary | ICD-10-CM | POA: Diagnosis not present

## 2018-01-12 DIAGNOSIS — R05 Cough: Secondary | ICD-10-CM

## 2018-01-12 DIAGNOSIS — C9000 Multiple myeloma not having achieved remission: Secondary | ICD-10-CM

## 2018-01-12 DIAGNOSIS — Z5112 Encounter for antineoplastic immunotherapy: Secondary | ICD-10-CM | POA: Diagnosis not present

## 2018-01-12 LAB — CBC WITH DIFFERENTIAL (CANCER CENTER ONLY)
Basophils Absolute: 0.1 10*3/uL (ref 0.0–0.1)
Basophils Relative: 2 %
EOS PCT: 1 %
Eosinophils Absolute: 0 10*3/uL (ref 0.0–0.5)
HCT: 42.7 % (ref 38.7–49.9)
HEMOGLOBIN: 14.9 g/dL (ref 13.0–17.1)
Lymphocytes Relative: 32 %
Lymphs Abs: 1.1 10*3/uL (ref 0.9–3.3)
MCH: 35.9 pg — AB (ref 28.0–33.4)
MCHC: 34.9 g/dL (ref 32.0–35.9)
MCV: 102.9 fL — ABNORMAL HIGH (ref 82.0–98.0)
Monocytes Absolute: 0.4 10*3/uL (ref 0.1–0.9)
Monocytes Relative: 12 %
Neutro Abs: 1.7 10*3/uL (ref 1.5–6.5)
Neutrophils Relative %: 53 %
PLATELETS: 200 10*3/uL (ref 145–400)
RBC: 4.15 MIL/uL — AB (ref 4.20–5.70)
RDW: 12.8 % (ref 11.1–15.7)
WBC: 3.3 10*3/uL — AB (ref 4.0–10.0)

## 2018-01-12 LAB — CMP (CANCER CENTER ONLY)
ALK PHOS: 51 U/L (ref 26–84)
ALT: 25 U/L (ref 10–47)
AST: 22 U/L (ref 11–38)
Albumin: 3.5 g/dL (ref 3.5–5.0)
Anion gap: 2 — ABNORMAL LOW (ref 5–15)
BUN: 16 mg/dL (ref 7–22)
CO2: 30 mmol/L (ref 18–33)
Calcium: 9.3 mg/dL (ref 8.0–10.3)
Chloride: 105 mmol/L (ref 98–108)
Creatinine: 0.9 mg/dL (ref 0.60–1.20)
Glucose, Bld: 215 mg/dL — ABNORMAL HIGH (ref 73–118)
POTASSIUM: 4.1 mmol/L (ref 3.3–4.7)
Sodium: 137 mmol/L (ref 128–145)
TOTAL PROTEIN: 5.9 g/dL — AB (ref 6.4–8.1)
Total Bilirubin: 1.1 mg/dL (ref 0.2–1.6)

## 2018-01-12 LAB — LACTATE DEHYDROGENASE: LDH: 152 U/L (ref 98–192)

## 2018-01-12 MED ORDER — FAMOTIDINE IN NACL 20-0.9 MG/50ML-% IV SOLN
20.0000 mg | Freq: Once | INTRAVENOUS | Status: AC
  Administered 2018-01-12: 20 mg via INTRAVENOUS

## 2018-01-12 MED ORDER — ACETAMINOPHEN 325 MG PO TABS
650.0000 mg | ORAL_TABLET | Freq: Once | ORAL | Status: AC
  Administered 2018-01-12: 650 mg via ORAL

## 2018-01-12 MED ORDER — PROCHLORPERAZINE MALEATE 10 MG PO TABS
10.0000 mg | ORAL_TABLET | Freq: Once | ORAL | Status: AC
  Administered 2018-01-12: 10 mg via ORAL

## 2018-01-12 MED ORDER — SODIUM CHLORIDE 0.9 % IV SOLN
Freq: Once | INTRAVENOUS | Status: AC
  Administered 2018-01-12: 10:00:00 via INTRAVENOUS
  Filled 2018-01-12: qty 250

## 2018-01-12 MED ORDER — FAMOTIDINE IN NACL 20-0.9 MG/50ML-% IV SOLN
INTRAVENOUS | Status: AC
  Filled 2018-01-12: qty 50

## 2018-01-12 MED ORDER — SODIUM CHLORIDE 0.9 % IV SOLN: 8.0000 mg | Freq: Once | INTRAVENOUS | Status: DC

## 2018-01-12 MED ORDER — ACETAMINOPHEN 325 MG PO TABS
ORAL_TABLET | ORAL | Status: AC
  Filled 2018-01-12: qty 2

## 2018-01-12 MED ORDER — DIPHENHYDRAMINE HCL 25 MG PO CAPS
50.0000 mg | ORAL_CAPSULE | Freq: Once | ORAL | Status: AC
  Administered 2018-01-12: 50 mg via ORAL

## 2018-01-12 MED ORDER — DEXAMETHASONE SODIUM PHOSPHATE 10 MG/ML IJ SOLN
INTRAMUSCULAR | Status: AC
  Filled 2018-01-12: qty 1

## 2018-01-12 MED ORDER — DEXAMETHASONE SODIUM PHOSPHATE 10 MG/ML IJ SOLN
8.0000 mg | Freq: Once | INTRAMUSCULAR | Status: AC
  Administered 2018-01-12: 8 mg via INTRAVENOUS

## 2018-01-12 MED ORDER — DIPHENHYDRAMINE HCL 25 MG PO CAPS
ORAL_CAPSULE | ORAL | Status: AC
  Filled 2018-01-12: qty 2

## 2018-01-12 MED ORDER — PROCHLORPERAZINE MALEATE 10 MG PO TABS
ORAL_TABLET | ORAL | Status: AC
  Filled 2018-01-12: qty 1

## 2018-01-12 MED ORDER — SODIUM CHLORIDE 0.9 % IV SOLN
800.0000 mg | Freq: Once | INTRAVENOUS | Status: AC
  Administered 2018-01-12: 800 mg via INTRAVENOUS
  Filled 2018-01-12: qty 32

## 2018-01-12 NOTE — Telephone Encounter (Signed)
Patient's wife notified that port placement has been scheduled for Thursday, 01/15/18 at 0830 at short stay at [facility].  Instructions given for pt to arrive at 0700 and to remain NPO after midnight.  Pt.'s wife appreciative of call and has no questions at this time.

## 2018-01-12 NOTE — Progress Notes (Signed)
Hematology and Oncology Follow Up Visit  Rickey [name] [id number] 1940/12/06 77 y.o. 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 Elotuzumab/Pomalidomide/decadron - start 01/12/2018    Interim History:  Mr. Chouinard is here today with his wife for follow-up.  He sounds and looks a whole lot better.  I really believe that Kyprolis is just a bad choice for him.  His kappa light chains were still going up.  His last Kappa Lightchain was 11 mg/dL.  We are changing him over to elotuzimab along with pomalidomide.  The issue now is that he is going to need a Port-A-Cath.  He just does not have great IV access with his diabetes.  I talked to he and his wife about this.  They are agreeable to the Port-A-Cath.  I really think it would make life easier for him.  They have been doing well up with her not at home.  They had a good weekend up there.  Overall, he has a performance status of ECOG 1.    Currently, his performance status is ECOG 1.    Medications:  Allergies as of 01/12/2018   No Known Allergies     Medication List        Accurate as of 01/12/18  9:37 AM. Always use your most recent med list.          acyclovir 400 MG tablet Commonly known as:  ZOVIRAX TAKE 1 TABLET(400 MG) BY MOUTH TWICE DAILY. 90 day supply   amLODipine 2.5 MG tablet Commonly known as:  NORVASC Take 2.5 mg by mouth daily.   aspirin 81 MG tablet Take 81 mg by mouth daily.   atorvastatin 10 MG tablet Commonly known as:  LIPITOR Take 10 mg by mouth daily.   azithromycin 250 MG tablet Commonly known as:  ZITHROMAX Take 250 mg by mouth daily.   calcium-vitamin D 250-125 MG-UNIT tablet Commonly known as:  OSCAL Take 1 tablet by mouth  daily.   carfilzomib 60 MG Solr Commonly known as:  KYPROLIS Inject into the vein.   cycloSPORINE 0.05 % ophthalmic emulsion Commonly known as:  RESTASIS 1 drop 2 (two) times daily.   DOCOSAHEXAENOIC ACID PO Take by mouth.   fish oil-omega-3 fatty acids 1000 MG capsule Take 2 g by mouth 2 (two) times daily.   fluticasone 50 MCG/ACT nasal spray Commonly known as:  FLONASE   INSULIN LISP & LISP PROT (HUM) [place]   insulin lispro 100 UNIT/ML injection Commonly known as:  HUMALOG Inject into the skin. Insulin pump   irbesartan-hydrochlorothiazide 300-12.5 MG tablet Commonly known as:  AVALIDE   ixazomib citrate 4 MG capsule Commonly known as:  NINLARO Take 1 capsule weekly for 3 weeks and 1 week off.Take on an empty stomach 1hr before or 2hrs after food. Do not crush, chew or open.   montelukast 10 MG tablet Commonly known as:  SINGULAIR Take 10 mg by mouth at bedtime.   ondansetron 4 MG disintegrating tablet Commonly known as:  ZOFRAN-ODT SMARTSIG:1 Tablet(s) Sublingual 4 Times Daily PRN   pomalidomide 4 MG capsule Commonly known as:  POMALYST Take 1 capsule (4 mg total) by mouth daily. Take with water on days 1-21. Repeat every 28  days. [id number]   telmisartan-hydrochlorothiazide 80-12.5 MG tablet Commonly known as:  MICARDIS HCT Take 1 tablet by mouth daily.   Vitamin D 2000 units Caps Take by mouth every morning.   XIFAXAN 200 MG tablet Generic drug:  rifaximin one tablet three times a day for three days for traveler's diarrhea       Allergies: No Known Allergies  Past Medical History, Surgical history, Social history, and Family History were reviewed and updated.  Review of Systems: Review of Systems  Constitutional: Negative.   HENT: Negative.   Eyes: Negative.   Respiratory: Negative.   Cardiovascular: Negative.   Gastrointestinal: Negative.   Genitourinary: Negative.   Musculoskeletal: Negative.   Skin: Negative.   Neurological: Negative.     Endo/Heme/Allergies: Negative.   Psychiatric/Behavioral: Negative.      Physical Exam:  weight is 195 lb 8 oz (88.7 kg). His oral temperature is 98.3 F (36.8 C). His blood pressure is 109/56 (abnormal) and his pulse is 69. His respiration is 18 and oxygen saturation is 97%.   Wt Readings from Last 3 Encounters:  01/12/18 195 lb 8 oz (88.7 kg)  01/01/18 191 lb (86.6 kg)  12/22/17 199 lb (90.3 kg)    Physical Exam  Constitutional: He is oriented to person, place, and time.  HENT:  Head: Normocephalic and atraumatic.  Mouth/Throat: Oropharynx is clear and moist.  Eyes: Pupils are equal, round, and reactive to light. EOM are normal.  Neck: Normal range of motion.  Cardiovascular: Normal rate, regular rhythm and normal heart sounds.  Pulmonary/Chest: Effort normal and breath sounds normal.  Abdominal: Soft. Bowel sounds are normal.  Musculoskeletal: Normal range of motion. He exhibits no edema, tenderness or deformity.  Lymphadenopathy:    He has no cervical adenopathy.  Neurological: He is alert and oriented to person, place, and time.  Skin: Skin is warm and dry. No rash noted. No erythema.  Psychiatric: He has a normal mood and affect. His behavior is normal. Judgment and thought content normal.  Vitals reviewed.    Lab Results  Component Value Date   WBC 3.3 (L) 01/12/2018   HGB 14.9 01/12/2018   HCT 42.7 01/12/2018   MCV 102.9 (H) 01/12/2018   PLT 200 01/12/2018   No results found for: FERRITIN, IRON, TIBC, UIBC, IRONPCTSAT Lab Results  Component Value Date   RBC 4.15 (L) 01/12/2018   Lab Results  Component Value Date   KPAFRELGTCHN 110.1 (H) 12/12/2017   LAMBDASER 2.3 (L) 12/12/2017   KAPLAMBRATIO 47.87 (H) 12/12/2017   Lab Results  Component Value Date   IGGSERUM 445 (L) 12/12/2017   IGA 44 (L) 12/12/2017   IGMSERUM 10 (L) 12/12/2017   Lab Results  Component Value Date   TOTALPROTELP 5.4 (L) 12/12/2017   ALBUMINELP 3.4 12/12/2017   A1GS 0.2  12/12/2017   A2GS 0.6 12/12/2017   BETS 0.7 12/12/2017   BETA2SER 0.3 04/19/2015   GAMS 0.4 12/12/2017   MSPIKE Not Observed 12/12/2017   SPEI Comment 12/05/2017     Chemistry      Component Value Date/Time   NA 139 01/01/2018 1318   NA 142 04/23/2017 1431   NA 139 07/18/2016 1135   K 4.8 (H) 01/01/2018 1318   K 4.0 04/23/2017 1431   K 4.4 07/18/2016 1135   CL 102 01/01/2018 1318   CL 105 04/23/2017 1431   CO2 29 01/01/2018 1318   CO2 28 04/23/2017 1431   CO2 27 07/18/2016 1135   BUN 17 01/01/2018  1318   BUN 20 04/23/2017 1431   BUN 12.2 07/18/2016 1135   CREATININE 1.10 01/01/2018 1318   CREATININE 1.2 04/23/2017 1431   CREATININE 0.9 07/18/2016 1135      Component Value Date/Time   CALCIUM 9.3 01/01/2018 1318   CALCIUM 9.3 04/23/2017 1431   CALCIUM 8.9 07/18/2016 1135   ALKPHOS 46 01/01/2018 1318   ALKPHOS 64 04/23/2017 1431   ALKPHOS 58 07/18/2016 1135   AST 17 01/01/2018 1318   AST 15 07/18/2016 1135   ALT 18 01/01/2018 1318   ALT 22 04/23/2017 1431   ALT 20 07/18/2016 1135   BILITOT 1.3 01/01/2018 1318   BILITOT 1.24 (H) 07/18/2016 1135      Impression and Plan: Mr. Sweigert is a very pleasant 77 yo caucasian gentleman with 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   Again, we are going to make a change in his protocol.  I will switch him over to elotuzimab/pomalidomide/Decadron (EPD).  I think this should be a reasonable protocol and hopefully effective protocol.  We will see how he does.  Hopefully, we will see that his light chains will be coming down.  I will recheck the light chains in about 3 weeks.  He will get weekly treatment.  Hopefully, the Port-A-Cath and go when this week.  [name], MD 8/19/20199:37 AM

## 2018-01-12 NOTE — Patient Instructions (Addendum)
Elotuzumab injection What is this medicine? ELOTUZUMAB (el oh tooz ue mab) is a monoclonal antibody. It is used to treat multiple myeloma. This medicine may be used for other purposes; ask your health care provider or pharmacist if you have questions. COMMON BRAND NAME(S): Empliciti What should I tell my health care provider before I take this medicine? They need to know if you have any of these conditions: -hepatic disease -infection -an unusual or allergic reaction to elotuzumab, other medicines, foods, dyes, or preservatives -pregnant or trying to get pregnant -breast-feeding How should I use this medicine? This medicine is for infusion into a vein. It is given by a health care professional in a hospital or clinic setting. Talk to your pediatrician regarding the use of this medicine in children. Special care may be needed. Overdosage: If you think you have taken too much of this medicine contact a poison control center or emergency room at once. NOTE: This medicine is only for you. Do not share this medicine with others. What if I miss a dose? Keep appointments for follow-up doses as directed. It is important not to miss your dose. Call your doctor or health care professional if you are unable to keep an appointment. What may interact with this medicine? Interactions have not been studied. Give your health care provider a list of all the medicines, herbs, non-prescription drugs, or dietary supplements you use. Also tell them if you smoke, drink alcohol, or use illegal drugs. Some items may interact with your medicine. This list may not describe all possible interactions. Give your health care provider a list of all the medicines, herbs, non-prescription drugs, or dietary supplements you use. Also tell them if you smoke, drink alcohol, or use illegal drugs. Some items may interact with your medicine. What should I watch for while using this medicine? This drug may make you feel generally  unwell. Report any side effects. Continue your course of treatment even though you feel ill unless your doctor tells you to stop. This medicine can cause serious allergic reactions. To reduce your risk you may need to take medicine before treatment with this medicine. Take your medicine as directed. You may need blood work done while you are taking this medicine. This medicine can affect the results of some tests used to determine treatment response; extra tests may be needed to evaluate response. Talk to your doctor about your risk of cancer. You may be more at risk for certain types of cancers if you take this medicine. Women should inform their doctor if they wish to become pregnant or think they might be pregnant. There is a potential for serious side effects to an unborn child. Talk to your health care professional or pharmacist for more information. Do not breast-feed an infant while taking this medicine. What side effects may I notice from receiving this medicine? Side effects that you should report to your doctor or health care professional as soon as possible: -allergic reactions like skin rash, itching or hives, swelling of the face, lips, or tongue -breathing problems -dizziness -lightheaded -signs and symptoms of infection like fever or chills; cough; sore throat; pain or trouble passing urine -signs and symptoms of liver injury like dark yellow or brown urine; general ill feeling or flu-like symptoms; light-colored stools; loss of appetite; nausea; right upper belly pain; unusually weak or tired; yellowing of the eyes or skin Side effects that usually do not require medical attention (report to your doctor or health care professional if they continue   or are bothersome): -constipation -decreased appetite -diarrhea -pain, tingling, numbness in the hands or feet -tiredness This list may not describe all possible side effects. Call your doctor for medical advice about side effects. You  may report side effects to FDA at 1-800-FDA-1088. Where should I keep my medicine? Keep out of the reach of children. This drug is given in a hospital or clinic and will not be stored at home. NOTE: This sheet is a summary. It may not cover all possible information. If you have questions about this medicine, talk to your doctor, pharmacist, or health care provider.  2018 Elsevier/Gold Standard (2015-06-15 10:43:53) Famotidine injection What is this medicine? FAMOTIDINE (fa MOE ti deen) is a type of antihistamine that blocks the release of stomach acid. It is used to treat stomach or intestinal ulcers. It can relieve ulcer pain and discomfort, and the heartburn from acid reflux. This medicine may be used for other purposes; ask your health care provider or pharmacist if you have questions. COMMON BRAND NAME(S): Pepcid What should I tell my health care provider before I take this medicine? They need to know if you have any of these conditions: -kidney or liver disease -an unusual or allergic reaction to famotidine, other medicines, foods, dyes, or preservatives -pregnant or trying to get pregnant -breast-feeding How should I use this medicine? This medicine is for infusion into a vein. It is given by a health care professional in a hospital or clinic setting. Talk to your pediatrician regarding the use of this medicine in children. Special care may be needed. Overdosage: If you think you have taken too much of this medicine contact a poison control center or emergency room at once. NOTE: This medicine is only for you. Do not share this medicine with others. What if I miss a dose? This does not apply. What may interact with this medicine? -delavirdine -itraconazole -ketoconazole This list may not describe all possible interactions. Give your health care provider a list of all the medicines, herbs, non-prescription drugs, or dietary supplements you use. Also tell them if you smoke, drink  alcohol, or use illegal drugs. Some items may interact with your medicine. What should I watch for while using this medicine? Tell your doctor or health care professional if your condition does not start to get better or gets worse. Do not take with aspirin, ibuprofen, or other antiinflammatory medicines. These can aggravate your condition. Do not smoke cigarettes or drink alcohol. These increase irritation in your stomach and can increase the time it will take for ulcers to heal. Cigarettes and alcohol can also worsen acid reflux or heartburn. If you get black, tarry stools or vomit up what looks like coffee grounds, call your doctor or health care professional at once. You may have a bleeding ulcer. What side effects may I notice from receiving this medicine? Side effects that you should report to your doctor or health care professional as soon as possible: -allergic reactions like skin rash, itching or hives, swelling of the face, lips, or tongue -agitation, nervousness -confusion -hallucinations Side effects that usually do not require medical attention (report to your doctor or health care professional if they continue or are bothersome): -constipation -diarrhea -dizziness -headache This list may not describe all possible side effects. Call your doctor for medical advice about side effects. You may report side effects to FDA at 1-800-FDA-1088. Where should I keep my medicine? This medicine is given in a hospital or clinic. You will not be given this medicine  to store at home. NOTE: This sheet is a summary. It may not cover all possible information. If you have questions about this medicine, talk to your doctor, pharmacist, or health care provider.  2018 Elsevier/Gold Standard (2007-09-16 13:24:51) Dexamethasone injection What is this medicine? DEXAMETHASONE (dex a METH a sone) is a corticosteroid. It is used to treat inflammation of the skin, joints, lungs, and other organs. Common  conditions treated include asthma, allergies, and arthritis. It is also used for other conditions, like blood disorders and diseases of the adrenal glands. This medicine may be used for other purposes; ask your health care provider or pharmacist if you have questions. COMMON BRAND NAME(S): Decadron, DoubleDex, Simplist Dexamethasone, Solurex What should I tell my health care provider before I take this medicine? They need to know if you have any of these conditions: -blood clotting problems -Cushing's syndrome -diabetes -glaucoma -heart problems or disease -high blood pressure -infection like herpes, measles, tuberculosis, or chickenpox -kidney disease -liver disease -mental problems -myasthenia gravis -osteoporosis -previous heart attack -seizures -stomach, ulcer or intestine disease including colitis and diverticulitis -thyroid problem -an unusual or allergic reaction to dexamethasone, corticosteroids, other medicines, lactose, foods, dyes, or preservatives -pregnant or trying to get pregnant -breast-feeding How should I use this medicine? This medicine is for injection into a muscle, joint, lesion, soft tissue, or vein. It is given by a health care professional in a hospital or clinic setting. Talk to your pediatrician regarding the use of this medicine in children. Special care may be needed. Overdosage: If you think you have taken too much of this medicine contact a poison control center or emergency room at once. NOTE: This medicine is only for you. Do not share this medicine with others. What if I miss a dose? This may not apply. If you are having a series of injections over a prolonged period, try not to miss an appointment. Call your doctor or health care professional to reschedule if you are unable to keep an appointment. What may interact with this medicine? Do not take this medicine with any of the following medications: -mifepristone, RU-486 -vaccines This medicine may  also interact with the following medications: -amphotericin B -antibiotics like clarithromycin, erythromycin, and troleandomycin -aspirin and aspirin-like drugs -barbiturates like phenobarbital -carbamazepine -cholestyramine -cholinesterase inhibitors like donepezil, galantamine, rivastigmine, and tacrine -cyclosporine -digoxin -diuretics -ephedrine -male hormones, like estrogens or progestins and birth control pills -indinavir -isoniazid -ketoconazole -medicines for diabetes -medicines that improve muscle tone or strength for conditions like myasthenia gravis -NSAIDs, medicines for pain and inflammation, like ibuprofen or naproxen -phenytoin -rifampin -thalidomide -warfarin This list may not describe all possible interactions. Give your health care provider a list of all the medicines, herbs, non-prescription drugs, or dietary supplements you use. Also tell them if you smoke, drink alcohol, or use illegal drugs. Some items may interact with your medicine. What should I watch for while using this medicine? Your condition will be monitored carefully while you are receiving this medicine. If you are taking this medicine for a long time, carry an identification card with your name and address, the type and dose of your medicine, and your doctor's name and address. This medicine may increase your risk of getting an infection. Stay away from people who are sick. Tell your doctor or health care professional if you are around anyone with measles or chickenpox. Talk to your health care provider before you get any vaccines that you take this medicine. If you are going to have surgery, tell  your doctor or health care professional that you have taken this medicine within the last twelve months. Ask your doctor or health care professional about your diet. You may need to lower the amount of salt you eat. The medicine can increase your blood sugar. If you are a diabetic check with your doctor if  you need help adjusting the dose of your diabetic medicine. What side effects may I notice from receiving this medicine? Side effects that you should report to your doctor or health care professional as soon as possible: -allergic reactions like skin rash, itching or hives, swelling of the face, lips, or tongue -black or tarry stools -change in the amount of urine -changes in vision -confusion, excitement, restlessness, a false sense of well-being -fever, sore throat, sneezing, cough, or other signs of infection, wounds that will not heal -hallucinations -increased thirst -mental depression, mood swings, mistaken feelings of self importance or of being mistreated -pain in hips, back, ribs, arms, shoulders, or legs -pain, redness, or irritation at the injection site -redness, blistering, peeling or loosening of the skin, including inside the mouth -rounding out of face -swelling of feet or lower legs -unusual bleeding or bruising -unusual tired or weak -wounds that do not heal Side effects that usually do not require medical attention (report to your doctor or health care professional if they continue or are bothersome): -diarrhea or constipation -change in taste -headache -nausea, vomiting -skin problems, acne, thin and shiny skin -touble sleeping -unusual growth of hair on the face or body -weight gain This list may not describe all possible side effects. Call your doctor for medical advice about side effects. You may report side effects to FDA at 1-800-FDA-1088. Where should I keep my medicine? This drug is given in a hospital or clinic and will not be stored at home. NOTE: This sheet is a summary. It may not cover all possible information. If you have questions about this medicine, talk to your doctor, pharmacist, or health care provider.  2018 Elsevier/Gold Standard (2007-09-03 14:04:12) Acetaminophen tablets or caplets What is this medicine? ACETAMINOPHEN (a set a MEE noe  fen) is a pain reliever. It is used to treat mild pain and fever. This medicine may be used for other purposes; ask your health care provider or pharmacist if you have questions. COMMON BRAND NAME(S): Aceta, Actamin, Anacin Aspirin Free, Genapap, Genebs, Mapap, Pain & Fever, Pain and Fever, PAIN RELIEF, PAIN RELIEF Extra Strength, Pain Reliever, Panadol, PHARBETOL, Q-Pap, Q-Pap Extra Strength, Tylenol, Tylenol CrushableTablet, Tylenol Extra Strength, XS No Aspirin, XS Pain Reliever What should I tell my health care provider before I take this medicine? They need to know if you have any of these conditions: -if you often drink alcohol -liver disease -an unusual or allergic reaction to acetaminophen, other medicines, foods, dyes, or preservatives -pregnant or trying to get pregnant -breast-feeding How should I use this medicine? Take this medicine by mouth with a glass of water. Follow the directions on the package or prescription label. Take your medicine at regular intervals. Do not take your medicine more often than directed. Talk to your pediatrician regarding the use of this medicine in children. While this drug may be prescribed for children as young as 46 years of age for selected conditions, precautions do apply. Overdosage: If you think you have taken too much of this medicine contact a poison control center or emergency room at once. NOTE: This medicine is only for you. Do not share this medicine with others. What  if I miss a dose? If you miss a dose, take it as soon as you can. If it is almost time for your next dose, take only that dose. Do not take double or extra doses. What may interact with this medicine? -alcohol -imatinib -isoniazid -other medicines with acetaminophen This list may not describe all possible interactions. Give your health care provider a list of all the medicines, herbs, non-prescription drugs, or dietary supplements you use. Also tell them if you smoke, drink  alcohol, or use illegal drugs. Some items may interact with your medicine. What should I watch for while using this medicine? Tell your doctor or health care professional if the pain lasts more than 10 days (5 days for children), if it gets worse, or if there is a new or different kind of pain. Also, check with your doctor if a fever lasts for more than 3 days. Do not take other medicines that contain acetaminophen with this medicine. Always read labels carefully. If you have questions, ask your doctor or pharmacist. If you take too much acetaminophen get medical help right away. Too much acetaminophen can be very dangerous and cause liver damage. Even if you do not have symptoms, it is important to get help right away. What side effects may I notice from receiving this medicine? Side effects that you should report to your doctor or health care professional as soon as possible: -allergic reactions like skin rash, itching or hives, swelling of the face, lips, or tongue -breathing problems -fever or sore throat -redness, blistering, peeling or loosening of the skin, including inside the mouth -trouble passing urine or change in the amount of urine -unusual bleeding or bruising -unusually weak or tired -yellowing of the eyes or skin Side effects that usually do not require medical attention (report to your doctor or health care professional if they continue or are bothersome): -headache -nausea, stomach upset This list may not describe all possible side effects. Call your doctor for medical advice about side effects. You may report side effects to FDA at 1-800-FDA-1088. Where should I keep my medicine? Keep out of reach of children. Store at room temperature between 20 and 25 degrees C (68 and 77 degrees F). Protect from moisture and heat. Throw away any unused medicine after the expiration date. NOTE: This sheet is a summary. It may not cover all possible information. If you have questions about  this medicine, talk to your doctor, pharmacist, or health care provider.  2018 Elsevier/Gold Standard (2013-01-04 12:54:16) Diphenhydramine capsules or tablets What is this medicine? DIPHENHYDRAMINE (dye fen HYE dra meen) is an antihistamine. It is used to treat the symptoms of an allergic reaction. It is also used to treat Parkinson's disease. This medicine is also used to prevent and to treat motion sickness and as a nighttime sleep aid. This medicine may be used for other purposes; ask your health care provider or pharmacist if you have questions. COMMON BRAND NAME(S): Alka-Seltzer Plus Allergy, Aller-G-Time, Banophen, Benadryl Allergy, Benadryl Allergy Dye Free, Benadryl Allergy Kapgel, Benadryl Allergy Ultratab, Diphedryl, Diphenhist, Genahist, PHARBEDRYL, Q-Dryl, Gretta Began, Valu-Dryl, Vicks ZzzQuil Nightime Sleep-Aid What should I tell my health care provider before I take this medicine? They need to know if you have any of these conditions: -asthma or lung disease -glaucoma -high blood pressure or heart disease -liver disease -pain or difficulty passing urine -prostate trouble -ulcers or other stomach problems -an unusual or allergic reaction to diphenhydramine, other medicines foods, dyes, or preservatives such as sulfites -pregnant  or trying to get pregnant -breast-feeding How should I use this medicine? Take this medicine by mouth with a full glass of water. Follow the directions on the prescription label. Take your doses at regular intervals. Do not take your medicine more often than directed. To prevent motion sickness start taking this medicine 30 to 60 minutes before you leave. Talk to your pediatrician regarding the use of this medicine in children. Special care may be needed. Patients over 37 years old may have a stronger reaction and need a smaller dose. Overdosage: If you think you have taken too much of this medicine contact a poison control center or emergency room at  once. NOTE: This medicine is only for you. Do not share this medicine with others. What if I miss a dose? If you miss a dose, take it as soon as you can. If it is almost time for your next dose, take only that dose. Do not take double or extra doses. What may interact with this medicine? Do not take this medicine with any of the following medications: -MAOIs like Carbex, Eldepryl, Marplan, Nardil, and Parnate This medicine may also interact with the following medications: -alcohol -barbiturates, like phenobarbital -medicines for bladder spasm like oxybutynin, tolterodine -medicines for blood pressure -medicines for depression, anxiety, or psychotic disturbances -medicines for movement abnormalities or Parkinson's disease -medicines for sleep -other medicines for cold, cough or allergy -some medicines for the stomach like chlordiazepoxide, dicyclomine This list may not describe all possible interactions. Give your health care provider a list of all the medicines, herbs, non-prescription drugs, or dietary supplements you use. Also tell them if you smoke, drink alcohol, or use illegal drugs. Some items may interact with your medicine. What should I watch for while using this medicine? Visit your doctor or health care professional for regular check ups. Tell your doctor if your symptoms do not improve or if they get worse. Your mouth may get dry. Chewing sugarless gum or sucking hard candy, and drinking plenty of water may help. Contact your doctor if the problem does not go away or is severe. This medicine may cause dry eyes and blurred vision. If you wear contact lenses you may feel some discomfort. Lubricating drops may help. See your eye doctor if the problem does not go away or is severe. You may get drowsy or dizzy. Do not drive, use machinery, or do anything that needs mental alertness until you know how this medicine affects you. Do not stand or sit up quickly, especially if you are an  older patient. This reduces the risk of dizzy or fainting spells. Alcohol may interfere with the effect of this medicine. Avoid alcoholic drinks. What side effects may I notice from receiving this medicine? Side effects that you should report to your doctor or health care professional as soon as possible: -allergic reactions like skin rash, itching or hives, swelling of the face, lips, or tongue -changes in vision -confused, agitated, nervous -irregular or fast heartbeat -tremor -trouble passing urine -unusual bleeding or bruising -unusually weak or tired Side effects that usually do not require medical attention (report to your doctor or health care professional if they continue or are bothersome): -constipation, diarrhea -drowsy -headache -loss of appetite -stomach upset, vomiting -thick mucous This list may not describe all possible side effects. Call your doctor for medical advice about side effects. You may report side effects to FDA at 1-800-FDA-1088. Where should I keep my medicine? Keep out of the reach of children. Store at  room temperature between 15 and 30 degrees C (59 and 86 degrees F). Keep container closed tightly. Throw away any unused medicine after the expiration date. NOTE: This sheet is a summary. It may not cover all possible information. If you have questions about this medicine, talk to your doctor, pharmacist, or health care provider.  2018 [employer]/Gold Standard (2007-08-31 17:06:22) Prochlorperazine tablets What is this medicine? PROCHLORPERAZINE (proe klor PER a zeen) helps to control severe nausea and vomiting. This medicine is also used to treat schizophrenia. It can also help patients who experience anxiety that is not due to psychological illness. This medicine may be used for other purposes; ask your health care provider or pharmacist if you have questions. COMMON BRAND NAME(S): Compazine What should I tell my health care provider before I take this  medicine? They need to know if you have any of these conditions: -blood disorders or disease -dementia -liver disease or jaundice -Parkinson's disease -uncontrollable movement disorder -an unusual or allergic reaction to prochlorperazine, other medicines, foods, dyes, or preservatives -pregnant or trying to get pregnant -breast-feeding How should I use this medicine? Take this medicine by mouth with a glass of water. Follow the directions on the prescription label. Take your doses at regular intervals. Do not take your medicine more often than directed. Do not stop taking this medicine suddenly. This can cause nausea, vomiting, and dizziness. Ask your doctor or health care professional for advice. Talk to your pediatrician regarding the use of this medicine in children. Special care may be needed. While this drug may be prescribed for children as young as 2 years for selected conditions, precautions do apply. Overdosage: If you think you have taken too much of this medicine contact a poison control center or emergency room at once. NOTE: This medicine is only for you. Do not share this medicine with others. What if I miss a dose? If you miss a dose, take it as soon as you can. If it is almost time for your next dose, take only that dose. Do not take double or extra doses. What may interact with this medicine? Do not take this medicine with any of the following medications: -amoxapine -antidepressants like citalopram, escitalopram, fluoxetine, paroxetine, and sertraline -deferoxamine -dofetilide -maprotiline -tricyclic antidepressants like amitriptyline, clomipramine, imipramine, nortiptyline and others This medicine may also interact with the following medications: -lithium -medicines for pain -phenytoin -propranolol -warfarin This list may not describe all possible interactions. Give your health care provider a list of all the medicines, herbs, non-prescription drugs, or dietary  supplements you use. Also tell them if you smoke, drink alcohol, or use illegal drugs. Some items may interact with your medicine. What should I watch for while using this medicine? Visit your doctor or health care professional for regular checks on your progress. You may get drowsy or dizzy. Do not drive, use machinery, or do anything that needs mental alertness until you know how this medicine affects you. Do not stand or sit up quickly, especially if you are an older patient. This reduces the risk of dizzy or fainting spells. Alcohol may interfere with the effect of this medicine. Avoid alcoholic drinks. This medicine can reduce the response of your body to heat or cold. Dress warm in cold weather and stay hydrated in hot weather. If possible, avoid extreme temperatures like saunas, hot tubs, very hot or cold showers, or activities that can cause dehydration such as vigorous exercise. This medicine can make you more sensitive to the sun. Keep out  of the sun. If you cannot avoid being in the sun, wear protective clothing and use sunscreen. Do not use sun lamps or tanning beds/booths. Your mouth may get dry. Chewing sugarless gum or sucking hard candy, and drinking plenty of water may help. Contact your doctor if the problem does not go away or is severe. What side effects may I notice from receiving this medicine? Side effects that you should report to your doctor or health care professional as soon as possible: -blurred vision -breast enlargement in men or women -breast milk in women who are not breast-feeding -chest pain, fast or irregular heartbeat -confusion, restlessness -dark yellow or brown urine -difficulty breathing or swallowing -dizziness or fainting spells -drooling, shaking, movement difficulty (shuffling walk) or rigidity -fever, chills, sore throat -involuntary or uncontrollable movements of the eyes, mouth, head, arms, and legs -seizures -stomach area pain -unusually weak or  tired -unusual bleeding or bruising -yellowing of skin or eyes Side effects that usually do not require medical attention (report to your doctor or health care professional if they continue or are bothersome): -difficulty passing urine -difficulty sleeping -headache -sexual dysfunction -skin rash, or itching This list may not describe all possible side effects. Call your doctor for medical advice about side effects. You may report side effects to FDA at 1-800-FDA-1088. Where should I keep my medicine? Keep out of the reach of children. Store at room temperature between 15 and 30 degrees C (59 and 86 degrees F). Protect from light. Throw away any unused medicine after the expiration date. NOTE: This sheet is a summary. It may not cover all possible information. If you have questions about this medicine, talk to your doctor, pharmacist, or health care provider.  2018 Elsevier/Gold Standard (2011-10-01 16:59:39)

## 2018-01-13 LAB — PROTEIN ELECTROPHORESIS, SERUM, WITH REFLEX
A/G RATIO SPE: 1.9 — AB (ref 0.7–1.7)
ALPHA-1-GLOBULIN: 0.2 g/dL (ref 0.0–0.4)
Albumin ELP: 3.6 g/dL (ref 2.9–4.4)
Alpha-2-Globulin: 0.6 g/dL (ref 0.4–1.0)
Beta Globulin: 0.7 g/dL (ref 0.7–1.3)
GAMMA GLOBULIN: 0.4 g/dL (ref 0.4–1.8)
GLOBULIN, TOTAL: 1.9 g/dL — AB (ref 2.2–3.9)
TOTAL PROTEIN ELP: 5.5 g/dL — AB (ref 6.0–8.5)

## 2018-01-13 LAB — IGG, IGA, IGM
IGA: 62 mg/dL (ref 61–437)
IGG (IMMUNOGLOBIN G), SERUM: 463 mg/dL — AB (ref 700–1600)
IGM (IMMUNOGLOBULIN M), SRM: 6 mg/dL — AB (ref 15–143)

## 2018-01-13 LAB — KAPPA/LAMBDA LIGHT CHAINS
KAPPA FREE LGHT CHN: 83.4 mg/L — AB (ref 3.3–19.4)
KAPPA, LAMDA LIGHT CHAIN RATIO: 34.75 — AB (ref 0.26–1.65)
LAMDA FREE LIGHT CHAINS: 2.4 mg/L — AB (ref 5.7–26.3)

## 2018-01-14 ENCOUNTER — Other Ambulatory Visit: Payer: Self-pay | Admitting: Radiology

## 2018-01-15 ENCOUNTER — Other Ambulatory Visit: Payer: Self-pay | Admitting: Hematology & Oncology

## 2018-01-15 ENCOUNTER — Encounter (HOSPITAL_COMMUNITY): Payer: Self-pay

## 2018-01-15 ENCOUNTER — Ambulatory Visit (HOSPITAL_COMMUNITY)
Admission: RE | Admit: 2018-01-15 | Discharge: 2018-01-15 | Disposition: A | Discharge status: Home / Self Care | Payer: Medicare Other | Source: Ambulatory Visit | Attending: Hematology & Oncology | Admitting: Hematology & Oncology

## 2018-01-15 DIAGNOSIS — Z7982 Long term (current) use of aspirin: Secondary | ICD-10-CM | POA: Insufficient documentation

## 2018-01-15 DIAGNOSIS — Z87891 Personal history of nicotine dependence: Secondary | ICD-10-CM | POA: Diagnosis not present

## 2018-01-15 DIAGNOSIS — Z9484 Stem cells transplant status: Secondary | ICD-10-CM | POA: Insufficient documentation

## 2018-01-15 DIAGNOSIS — E109 Type 1 diabetes mellitus without complications: Secondary | ICD-10-CM | POA: Insufficient documentation

## 2018-01-15 DIAGNOSIS — Z452 Encounter for adjustment and management of vascular access device: Secondary | ICD-10-CM | POA: Diagnosis not present

## 2018-01-15 DIAGNOSIS — C9000 Multiple myeloma not having achieved remission: Secondary | ICD-10-CM

## 2018-01-15 DIAGNOSIS — C9002 Multiple myeloma in relapse: Secondary | ICD-10-CM | POA: Diagnosis not present

## 2018-01-15 DIAGNOSIS — Z5111 Encounter for antineoplastic chemotherapy: Secondary | ICD-10-CM | POA: Diagnosis not present

## 2018-01-15 HISTORY — PX: IR IMAGING GUIDED PORT INSERTION: IMG5740

## 2018-01-15 LAB — BASIC METABOLIC PANEL
Anion gap: 6 (ref 5–15)
BUN: 15 mg/dL (ref 8–23)
CHLORIDE: 104 mmol/L (ref 98–111)
CO2: 29 mmol/L (ref 22–32)
CREATININE: 1.01 mg/dL (ref 0.61–1.24)
Calcium: 8.7 mg/dL — ABNORMAL LOW (ref 8.9–10.3)
GFR calc Af Amer: >60 mL/min (ref >60)
Glucose, Bld: 147 mg/dL — ABNORMAL HIGH (ref 70–99)
Potassium: 4.1 mmol/L (ref 3.5–5.1)
SODIUM: 139 mmol/L (ref 135–145)

## 2018-01-15 LAB — CBC
HCT: 45.2 % (ref 39.0–52.0)
Hemoglobin: 14.9 g/dL (ref 13.0–17.0)
MCH: 34.8 pg — ABNORMAL HIGH (ref 26.0–34.0)
MCHC: 33 g/dL (ref 30.0–36.0)
MCV: 105.6 fL — ABNORMAL HIGH (ref 78.0–100.0)
PLATELETS: 180 10*3/uL (ref 150–400)
RBC: 4.28 MIL/uL (ref 4.22–5.81)
RDW: 13.2 % (ref 11.5–15.5)
WBC: 6.1 10*3/uL (ref 4.0–10.5)

## 2018-01-15 LAB — GLUCOSE, CAPILLARY: GLUCOSE-CAPILLARY: 134 mg/dL — AB (ref 70–99)

## 2018-01-15 LAB — PROTIME-INR
INR: 1.01
Prothrombin Time: 13.2 seconds (ref 11.4–15.2)

## 2018-01-15 MED ORDER — SODIUM CHLORIDE 0.9 % IV SOLN: INTRAVENOUS | Status: DC

## 2018-01-15 MED ORDER — LIDOCAINE-EPINEPHRINE 1 %-1:100000 IJ SOLN
INTRAMUSCULAR | Status: AC | PRN
  Administered 2018-01-15: 10 mL

## 2018-01-15 MED ORDER — FENTANYL CITRATE (PF) 100 MCG/2ML IJ SOLN
INTRAMUSCULAR | Status: AC
  Filled 2018-01-15: qty 4

## 2018-01-15 MED ORDER — CEFAZOLIN SODIUM-DEXTROSE 2-4 GM/100ML-% IV SOLN
2.0000 g | INTRAVENOUS | Status: AC
  Administered 2018-01-15: 2 g via INTRAVENOUS

## 2018-01-15 MED ORDER — LIDOCAINE HCL 1 % IJ SOLN
INTRAMUSCULAR | Status: AC | PRN
  Administered 2018-01-15: 5 mL

## 2018-01-15 MED ORDER — LIDOCAINE-EPINEPHRINE (PF) 1 %-1:200000 IJ SOLN
INTRAMUSCULAR | Status: AC
  Filled 2018-01-15: qty 30

## 2018-01-15 MED ORDER — HEPARIN SOD (PORK) LOCK FLUSH 100 UNIT/ML IV SOLN
INTRAVENOUS | Status: AC | PRN
  Administered 2018-01-15: 500 [IU] via INTRAVENOUS

## 2018-01-15 MED ORDER — FENTANYL CITRATE (PF) 100 MCG/2ML IJ SOLN
INTRAMUSCULAR | Status: AC | PRN
  Administered 2018-01-15: 50 ug via INTRAVENOUS
  Administered 2018-01-15 (×2): 25 ug via INTRAVENOUS

## 2018-01-15 MED ORDER — CEFAZOLIN SODIUM-DEXTROSE 2-4 GM/100ML-% IV SOLN
INTRAVENOUS | Status: AC
  Administered 2018-01-15: 2 g via INTRAVENOUS
  Filled 2018-01-15: qty 100

## 2018-01-15 MED ORDER — MIDAZOLAM HCL 2 MG/2ML IJ SOLN
INTRAMUSCULAR | Status: AC
  Filled 2018-01-15: qty 4

## 2018-01-15 MED ORDER — MIDAZOLAM HCL 2 MG/2ML IJ SOLN
INTRAMUSCULAR | Status: AC | PRN
  Administered 2018-01-15: 1 mg via INTRAVENOUS
  Administered 2018-01-15 (×2): 0.5 mg via INTRAVENOUS

## 2018-01-15 MED ORDER — HEPARIN SOD (PORK) LOCK FLUSH 100 UNIT/ML IV SOLN
INTRAVENOUS | Status: AC
  Filled 2018-01-15: qty 5

## 2018-01-15 MED ORDER — SODIUM CHLORIDE 0.9 % IV SOLN
INTRAVENOUS | Status: AC | PRN
  Administered 2018-01-15: 10 mL/h via INTRAVENOUS

## 2018-01-15 MED ORDER — LIDOCAINE HCL 1 % IJ SOLN
INTRAMUSCULAR | Status: AC
  Filled 2018-01-15: qty 20

## 2018-01-15 NOTE — Sedation Documentation (Signed)
Patient is resting comfortably. 

## 2018-01-15 NOTE — Sedation Documentation (Addendum)
Patient is resting comfortably. 

## 2018-01-15 NOTE — Discharge Instructions (Signed)
Implanted Port Insertion, Care After °This sheet gives you information about how to care for yourself after your procedure. Your health care provider may also give you more specific instructions. If you have problems or questions, contact your health care provider. °What can I expect after the procedure? °After your procedure, it is common to have: °· Discomfort at the port insertion site. °· Bruising on the skin over the port. This should improve over 3-4 days. ° °Follow these instructions at home: °Port care °· After your port is placed, you will get a manufacturer's information card. The card has information about your port. Keep this card with you at all times. °· Take care of the port as told by your health care provider. Ask your health care provider if you or a family member can get training for taking care of the port at home. A home health care nurse may also take care of the port. °· Make sure to remember what type of port you have. °Incision care °· Follow instructions from your health care provider about how to take care of your port insertion site. Make sure you: °? Wash your hands with soap and water before you change your bandage (dressing). If soap and water are not available, use hand sanitizer. °? Change your dressing as told by your health care provider. °? Leave stitches (sutures), skin glue, or adhesive strips in place. These skin closures may need to stay in place for 2 weeks or longer. If adhesive strip edges start to loosen and curl up, you may trim the loose edges. Do not remove adhesive strips completely unless your health care provider tells you to do that. °· Check your port insertion site every day for signs of infection. Check for: °? More redness, swelling, or pain. °? More fluid or blood. °? Warmth. °? Pus or a bad smell. °General instructions °· Do not take baths, swim, or use a hot tub until your health care provider approves. °· Do not lift anything that is heavier than 10 lb (4.5  kg) for a week, or as told by your health care provider. °· Ask your health care provider when it is okay to: °? Return to work or school. °? Resume usual physical activities or sports. °· Do not drive for 24 hours if you were given a medicine to help you relax (sedative). °· Take over-the-counter and prescription medicines only as told by your health care provider. °· Wear a medical alert bracelet in case of an emergency. This will tell any health care providers that you have a port. °· Keep all follow-up visits as told by your health care provider. This is important. °Contact a health care provider if: °· You cannot flush your port with saline as directed, or you cannot draw blood from the port. °· You have a fever or chills. °· You have more redness, swelling, or pain around your port insertion site. °· You have more fluid or blood coming from your port insertion site. °· Your port insertion site feels warm to the touch. °· You have pus or a bad smell coming from the port insertion site. °Get help right away if: °· You have chest pain or shortness of breath. °· You have bleeding from your port that you cannot control. °Summary °· Take care of the port as told by your health care provider. °· Change your dressing as told by your health care provider. °· Keep all follow-up visits as told by your health care provider. °  This information is not intended to replace advice given to you by your health care provider. Make sure you discuss any questions you have with your health care provider. °Document Released: 03/03/2013 Document Revised: 04/03/2016 Document Reviewed: 04/03/2016 °Elsevier Interactive Patient Education © 2017 Elsevier Inc. °Moderate Conscious Sedation, Adult, Care After °These instructions provide you with information about caring for yourself after your procedure. Your health care provider may also give you more specific instructions. Your treatment has been planned according to current medical  practices, but problems sometimes occur. Call your health care provider if you have any problems or questions after your procedure. °What can I expect after the procedure? °After your procedure, it is common: °· To feel sleepy for several hours. °· To feel clumsy and have poor balance for several hours. °· To have poor judgment for several hours. °· To vomit if you eat too soon. ° °Follow these instructions at home: °For at least 24 hours after the procedure: ° °· Do not: °? Participate in activities where you could fall or become injured. °? Drive. °? Use heavy machinery. °? Drink alcohol. °? Take sleeping pills or medicines that cause drowsiness. °? Make important decisions or sign legal documents. °? Take care of children on your own. °· Rest. °Eating and drinking °· Follow the diet recommended by your health care provider. °· If you vomit: °? Drink water, juice, or soup when you can drink without vomiting. °? Make sure you have little or no nausea before eating solid foods. °General instructions °· Have a responsible adult stay with you until you are awake and alert. °· Take over-the-counter and prescription medicines only as told by your health care provider. °· If you smoke, do not smoke without supervision. °· Keep all follow-up visits as told by your health care provider. This is important. °Contact a health care provider if: °· You keep feeling nauseous or you keep vomiting. °· You feel light-headed. °· You develop a rash. °· You have a fever. °Get help right away if: °· You have trouble breathing. °This information is not intended to replace advice given to you by your health care provider. Make sure you discuss any questions you have with your health care provider. °Document Released: 03/03/2013 Document Revised: 10/16/2015 Document Reviewed: 09/02/2015 °Elsevier Interactive Patient Education © 2018 Elsevier Inc. ° °

## 2018-01-15 NOTE — Procedures (Signed)
Placement of right jugular port.  Tip at SVC/RA junction.  Minimal blood loss and no immediate complication.  

## 2018-01-15 NOTE — H&P (Signed)
Chief Complaint: Patient was seen in consultation today for [facility] a cath placement at the request of Ennever,Peter R  Referring Physician(s): Ennever,Peter R  Supervising Physician: Markus Daft  Patient Status: [facility] - Out-pt  History of Present Illness: Rickey [name] is a 77 y.o. male   Multiple Myeloma relapse Dx 2010 Need for [facility]; to start use Monday   Past Medical History:  Diagnosis Date  . 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 APPENDECTOMY  2000  . 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  Medications: Prior to Admission medications   Medication Sig Start Date End Date Taking? Authorizing Provider  acyclovir (ZOVIRAX) 400 MG tablet TAKE 1 TABLET(400 MG) BY MOUTH TWICE DAILY. 90 day supply Patient taking differently: Take 400 mg by mouth 2 (two) times daily. TAKE 1 TABLET(400 MG) BY MOUTH TWICE DAILY. 90 day supply 07/23/17  Yes Ennever, Rudell Cobb, MD  amLODipine (NORVASC) 2.5 MG tablet Take 2.5 mg by mouth daily.  08/24/12  Yes [provider]  aspirin 81 MG tablet Take 81 mg by mouth daily.     Yes [provider]  atorvastatin (LIPITOR) 10 MG tablet Take 10 mg by mouth daily.     Yes [provider]  calcium-vitamin D (OSCAL) 250-125 MG-UNIT per tablet Take 1 tablet by mouth daily.    Yes [provider]  Cholecalciferol (VITAMIN D) 2000 UNITS CAPS Take by mouth every morning.   Yes [provider]  cycloSPORINE (RESTASIS) 0.05 % ophthalmic emulsion Place 1 drop into both eyes 2 (two) times daily.    Yes [provider]  fish oil-omega-3 fatty acids 1000 MG capsule Take 1 g by mouth 2 (two) times daily.    Yes [provider]  insulin lispro (HUMALOG) 100 UNIT/ML injection Inject into the skin. Insulin pump   Yes [provider]    Insulin Lispro Prot & Lispro (INSULIN LISP & LISP PROT, HUM, [place])  06/13/10  Yes [provider]  irbesartan-hydrochlorothiazide (AVALIDE) 300-12.5 MG tablet Take 1 tablet by mouth daily.  08/11/17  Yes [provider]  montelukast (SINGULAIR) 10 MG tablet Take 10 mg by mouth at bedtime.     Yes [provider]  ondansetron (ZOFRAN-ODT) 4 MG disintegrating tablet Take 4 mg by mouth 4 (four) times daily as needed for nausea or vomiting.  10/25/17  Yes [provider]  pomalidomide (POMALYST) 4 MG capsule Take 1 capsule (4 mg total) by mouth daily. Take with water on days 1-21. Repeat every 28 days. QAST#[id number] 12/30/17  Yes [name], MD  rifaximin (XIFAXAN) 200 MG tablet Take 200 mg by mouth 3 (three) times daily as needed.  06/13/10   [provider]  prochlorperazine (COMPAZINE) 10 MG tablet TAKE 1 TABLET(10 MG) BY MOUTH EVERY 6 HOURS AS NEEDED FOR NAUSEA OR VOMITING 12/05/17 01/01/18  [name], MD     Family History  Problem Relation Age of Onset  . Congestive Heart Failure Father   . Colon cancer Neg Hx     Social History   Socioeconomic History  . Marital status: Married    Spouse name: Not on file  . Number of children: Not on file  . Years of education: Not on file  . Highest education level: Not on file  Occupational History  . Not on file  Social Needs  . Financial resource strain: Not on file  . Food insecurity:    Worry: Not on file    Inability: Not on file  . Transportation needs:    Medical: Not on file    Non-medical: Not on file  Tobacco Use  . Smoking status: Former Smoker    Last attempt to quit: 10/10/1976    Years since quitting: 41.2  . Smokeless tobacco: Never Used  . Tobacco comment: quit 38 years ago  Substance and Sexual Activity  . Alcohol use: Yes    Alcohol/week: 2.0 standard drinks    Types: 2 Glasses of wine per week    Comment: 2 glasses a day  . Drug use: Never  . Sexual activity: Not on  file  Lifestyle  . Physical activity:    Days per week: Not on file    Minutes per session: Not on file  . Stress: Not on file  Relationships  . Social connections:    Talks on phone: Not on file    Gets together: Not on file    Attends religious service: Not on file    Active member of club or organization: Not on file    Attends meetings of clubs or organizations: Not on file    Relationship status: Not on file  Other Topics Concern  . Not on file  Social History Narrative  . Not on file    Review of Systems: A 12 point ROS discussed and pertinent positives are indicated in the HPI above.  All other systems are negative.  Review of Systems  Constitutional: Positive for fatigue. Negative for activity change, fever and unexpected weight change.  Respiratory: Negative for cough and shortness of breath.   Cardiovascular: Negative for chest pain.  Gastrointestinal: Negative for abdominal pain.  Musculoskeletal: Negative for back pain.  Neurological: Negative for weakness.  Psychiatric/Behavioral: Negative for behavioral problems and confusion.    Vital Signs: BP (!) 117/52   Pulse 63   Temp 97.7 F (36.5 C) (Oral)   Resp 16   Ht 6' (1.829 m)   Wt 190 lb (86.2 kg)   SpO2 95%   BMI 25.77 kg/m   Physical Exam  Constitutional: He is oriented to person, place, and time.  Cardiovascular: Normal rate, regular rhythm and normal heart sounds.  Pulmonary/Chest: Effort normal and breath sounds normal.  Abdominal: Soft. Bowel sounds are normal.  Musculoskeletal: Normal range of motion.  Neurological: He is alert and oriented to person, place, and time.  Skin: Skin is warm and dry.  Psychiatric: He has a normal mood and affect. His behavior is normal. Judgment and thought content normal.  Vitals reviewed.   Imaging: Dg Chest 2 View  Result Date: 01/01/2018 CLINICAL DATA:  77 year old male with a history of cough EXAM: CHEST - 2 VIEW COMPARISON:  05/03/2014 FINDINGS:  Cardiomediastinal silhouette within normal limits in size and contour. No evidence of central vascular congestion or interlobular septal thickening. No confluent airspace disease. No pneumothorax or pleural effusion. Coarsened interstitial markings. Chronic right chest wall deformity IMPRESSION: Chronic lung changes without evidence of acute cardiopulmonary disease Electronically Signed   By: Corrie Mckusick D.O.   On: 01/01/2018 13:11    Labs:  CBC: Recent Labs    12/12/17 1140 12/22/17 1125 01/01/18 1318 01/12/18 0832  WBC 5.6 5.2 6.1 3.3*  HGB 14.8 14.6 15.0 14.9  HCT 44.5 42.3 44.3 42.7  PLT 136* 138*  194 200    COAGS: Recent Labs    01/15/18 0657  INR 1.01    BMP: Recent Labs    12/12/17 1140 12/22/17 1125 01/01/18 1318 01/12/18 0832  NA 139 141 139 137  K 4.6 5.2* 4.8* 4.1  CL 104 106 102 105  CO2 _0 GLUCOSE 146* 192* 147* 215*  BUN _1 CALCIUM 8.7 8.6 9.3 9.3  CREATININE 1.00 1.20 1.10 0.90    LIVER FUNCTION TESTS: Recent Labs    12/12/17 1140 12/22/17 1125 01/01/18 1318 01/12/18 0832  BILITOT 1.2 1.3 1.3 1.1  AST _2 ALT _3 ALKPHOS 49 40 46 51  PROT 5.7* 5.7* 6.2* 5.9*  ALBUMIN 3.3* 3.3* 3.4* 3.5    TUMOR MARKERS: No results for input(s): AFPTM, CEA, CA199, CHROMGRNA in the last 8760 hours.  Assessment and Plan:  Multiple Myeloma- relapse Port a cath placement scheduled today Risks and benefits of image guided port-a-catheter placement was discussed with the patient including, but not limited to bleeding, infection, pneumothorax, or fibrin sheath development and need for additional procedures.  All of the patient's questions were answered, patient is agreeable to proceed. Consent signed and in chart.   Thank you for this interesting consult.  I greatly enjoyed meeting KAYDIN KARBOWSKI and look forward to participating in their care.  A copy of this report was sent to the requesting provider on this  date.  Electronically Signed: Lavonia Drafts, PA-C 01/15/2018, 7:45 AM   I spent a total of  30 Minutes   in face to face in clinical consultation, greater than 50% of which was counseling/coordinating care for [facility]

## 2018-01-16 ENCOUNTER — Ambulatory Visit: Payer: Medicare Other

## 2018-01-16 ENCOUNTER — Other Ambulatory Visit: Payer: Medicare Other

## 2018-01-19 ENCOUNTER — Inpatient Hospital Stay: Payer: Medicare Other

## 2018-01-19 ENCOUNTER — Other Ambulatory Visit: Payer: Self-pay | Admitting: *Deleted

## 2018-01-19 VITALS — BP 127/52 | HR 58 | Temp 97.8°F | Resp 20

## 2018-01-19 DIAGNOSIS — Z5112 Encounter for antineoplastic immunotherapy: Secondary | ICD-10-CM | POA: Diagnosis not present

## 2018-01-19 DIAGNOSIS — R05 Cough: Secondary | ICD-10-CM | POA: Diagnosis not present

## 2018-01-19 DIAGNOSIS — C9000 Multiple myeloma not having achieved remission: Secondary | ICD-10-CM

## 2018-01-19 DIAGNOSIS — C9002 Multiple myeloma in relapse: Secondary | ICD-10-CM | POA: Diagnosis not present

## 2018-01-19 LAB — CMP (CANCER CENTER ONLY)
ALBUMIN: 3.4 g/dL — AB (ref 3.5–5.0)
ALK PHOS: 51 U/L (ref 26–84)
ALT: 21 U/L (ref 10–47)
AST: 20 U/L (ref 11–38)
Anion gap: 3 — ABNORMAL LOW (ref 5–15)
BUN: 12 mg/dL (ref 7–22)
CHLORIDE: 104 mmol/L (ref 98–108)
CO2: 28 mmol/L (ref 18–33)
CREATININE: 0.9 mg/dL (ref 0.60–1.20)
Calcium: 9.2 mg/dL (ref 8.0–10.3)
Glucose, Bld: 126 mg/dL — ABNORMAL HIGH (ref 73–118)
Potassium: 4.1 mmol/L (ref 3.3–4.7)
SODIUM: 135 mmol/L (ref 128–145)
Total Bilirubin: 0.9 mg/dL (ref 0.2–1.6)
Total Protein: 5.7 g/dL — ABNORMAL LOW (ref 6.4–8.1)

## 2018-01-19 LAB — CBC WITH DIFFERENTIAL (CANCER CENTER ONLY)
Basophils Absolute: 0 10*3/uL (ref 0.0–0.1)
Basophils Relative: 0 %
EOS ABS: 0.3 10*3/uL (ref 0.0–0.5)
EOS PCT: 6 %
HCT: 44.3 % (ref 38.7–49.9)
Hemoglobin: 14.9 g/dL (ref 13.0–17.1)
LYMPHS ABS: 0.9 10*3/uL (ref 0.9–3.3)
Lymphocytes Relative: 16 %
MCH: 35.1 pg — AB (ref 28.0–33.4)
MCHC: 33.6 g/dL (ref 32.0–35.9)
MCV: 104.2 fL — ABNORMAL HIGH (ref 82.0–98.0)
MONOS PCT: 15 %
Monocytes Absolute: 0.8 10*3/uL (ref 0.1–0.9)
Neutro Abs: 3.4 10*3/uL (ref 1.5–6.5)
Neutrophils Relative %: 63 %
PLATELETS: 148 10*3/uL (ref 145–400)
RBC: 4.25 MIL/uL (ref 4.20–5.70)
RDW: 13 % (ref 11.1–15.7)
WBC Count: 5.4 10*3/uL (ref 4.0–10.0)

## 2018-01-19 MED ORDER — FAMOTIDINE IN NACL 20-0.9 MG/50ML-% IV SOLN
INTRAVENOUS | Status: AC
  Filled 2018-01-19: qty 50

## 2018-01-19 MED ORDER — SODIUM CHLORIDE 0.9 % IV SOLN
800.0000 mg | Freq: Once | INTRAVENOUS | Status: AC
  Administered 2018-01-19: 800 mg via INTRAVENOUS
  Filled 2018-01-19: qty 32

## 2018-01-19 MED ORDER — DEXAMETHASONE SODIUM PHOSPHATE 10 MG/ML IJ SOLN
INTRAMUSCULAR | Status: AC
  Filled 2018-01-19: qty 1

## 2018-01-19 MED ORDER — PROCHLORPERAZINE MALEATE 10 MG PO TABS
ORAL_TABLET | ORAL | Status: AC
  Filled 2018-01-19: qty 1

## 2018-01-19 MED ORDER — DIPHENHYDRAMINE HCL 25 MG PO CAPS
ORAL_CAPSULE | ORAL | Status: AC
  Filled 2018-01-19: qty 2

## 2018-01-19 MED ORDER — FAMOTIDINE IN NACL 20-0.9 MG/50ML-% IV SOLN
20.0000 mg | Freq: Once | INTRAVENOUS | Status: AC
  Administered 2018-01-19: 20 mg via INTRAVENOUS

## 2018-01-19 MED ORDER — HEPARIN SOD (PORK) LOCK FLUSH 100 UNIT/ML IV SOLN
500.0000 [IU] | Freq: Once | INTRAVENOUS | Status: AC | PRN
  Administered 2018-01-19: 500 [IU]
  Filled 2018-01-19: qty 5

## 2018-01-19 MED ORDER — DEXAMETHASONE SODIUM PHOSPHATE 10 MG/ML IJ SOLN
8.0000 mg | Freq: Once | INTRAMUSCULAR | Status: AC
  Administered 2018-01-19: 8 mg via INTRAVENOUS

## 2018-01-19 MED ORDER — LIDOCAINE-PRILOCAINE 2.5-2.5 % EX CREA: 1.0000 "application " | TOPICAL_CREAM | CUTANEOUS | 0 refills | Status: DC | PRN

## 2018-01-19 MED ORDER — ACETAMINOPHEN 325 MG PO TABS
ORAL_TABLET | ORAL | Status: AC
  Filled 2018-01-19: qty 2

## 2018-01-19 MED ORDER — SODIUM CHLORIDE 0.9% FLUSH
10.0000 mL | INTRAVENOUS | Status: DC | PRN
  Administered 2018-01-19: 10 mL
  Filled 2018-01-19: qty 10

## 2018-01-19 MED ORDER — DIPHENHYDRAMINE HCL 25 MG PO CAPS
50.0000 mg | ORAL_CAPSULE | Freq: Once | ORAL | Status: AC
  Administered 2018-01-19: 50 mg via ORAL

## 2018-01-19 MED ORDER — ACETAMINOPHEN 325 MG PO TABS
650.0000 mg | ORAL_TABLET | Freq: Once | ORAL | Status: AC
  Administered 2018-01-19: 650 mg via ORAL

## 2018-01-19 MED ORDER — SODIUM CHLORIDE 0.9 % IV SOLN
Freq: Once | INTRAVENOUS | Status: AC
  Administered 2018-01-19: 13:00:00 via INTRAVENOUS
  Filled 2018-01-19: qty 250

## 2018-01-19 MED ORDER — PROCHLORPERAZINE MALEATE 10 MG PO TABS
10.0000 mg | ORAL_TABLET | Freq: Once | ORAL | Status: AC
  Administered 2018-01-19: 10 mg via ORAL

## 2018-01-19 NOTE — Patient Instructions (Signed)
[facility] Discharge Instructions for Patients Receiving Chemotherapy  Today you received the following chemotherapy agents:  Empliciti  To help prevent nausea and vomiting after your treatment, we encourage you to take your nausea medication as ordered per MD.    If you develop nausea and vomiting that is not controlled by your nausea medication, call the clinic.   BELOW ARE SYMPTOMS THAT SHOULD BE REPORTED IMMEDIATELY:  *FEVER GREATER THAN 100.5 F  *CHILLS WITH OR WITHOUT FEVER  NAUSEA AND VOMITING THAT IS NOT CONTROLLED WITH YOUR NAUSEA MEDICATION  *UNUSUAL SHORTNESS OF BREATH  *UNUSUAL BRUISING OR BLEEDING  TENDERNESS IN MOUTH AND THROAT WITH OR WITHOUT PRESENCE OF ULCERS  *URINARY PROBLEMS  *BOWEL PROBLEMS  UNUSUAL RASH Items with * indicate a potential emergency and should be followed up as soon as possible.  Feel free to call the clinic should you have any questions or concerns. The clinic phone number is [phone].  Please show the [facility] at check-in to the Emergency Department and triage nurse.

## 2018-01-20 ENCOUNTER — Other Ambulatory Visit: Payer: Self-pay | Admitting: *Deleted

## 2018-01-20 LAB — KAPPA/LAMBDA LIGHT CHAINS
KAPPA FREE LGHT CHN: 64.4 mg/L — AB (ref 3.3–19.4)
Kappa, lambda light chain ratio: 30.67 — ABNORMAL HIGH (ref 0.26–1.65)
Lambda free light chains: 2.1 mg/L — ABNORMAL LOW (ref 5.7–26.3)

## 2018-01-20 LAB — IGG, IGA, IGM
IGA: 44 mg/dL — AB (ref 61–437)
IgG (Immunoglobin G), Serum: 468 mg/dL — ABNORMAL LOW (ref 700–1600)
IgM (Immunoglobulin M), Srm: 6 mg/dL — ABNORMAL LOW (ref 15–143)

## 2018-01-20 MED ORDER — LIDOCAINE-PRILOCAINE 2.5-2.5 % EX CREA: 1.0000 "application " | TOPICAL_CREAM | CUTANEOUS | 3 refills | Status: DC | PRN

## 2018-01-23 ENCOUNTER — Other Ambulatory Visit: Payer: Self-pay | Admitting: *Deleted

## 2018-01-23 DIAGNOSIS — C9000 Multiple myeloma not having achieved remission: Secondary | ICD-10-CM

## 2018-01-27 ENCOUNTER — Inpatient Hospital Stay: Payer: Medicare Other | Attending: Hematology & Oncology

## 2018-01-27 ENCOUNTER — Other Ambulatory Visit: Payer: Self-pay | Admitting: *Deleted

## 2018-01-27 ENCOUNTER — Inpatient Hospital Stay: Payer: Medicare Other

## 2018-01-27 VITALS — BP 120/60 | HR 60 | Temp 97.7°F | Resp 17

## 2018-01-27 DIAGNOSIS — C9000 Multiple myeloma not having achieved remission: Secondary | ICD-10-CM

## 2018-01-27 DIAGNOSIS — S91332A Puncture wound without foreign body, left foot, initial encounter: Secondary | ICD-10-CM

## 2018-01-27 DIAGNOSIS — C9002 Multiple myeloma in relapse: Secondary | ICD-10-CM | POA: Diagnosis not present

## 2018-01-27 DIAGNOSIS — Z5112 Encounter for antineoplastic immunotherapy: Secondary | ICD-10-CM | POA: Insufficient documentation

## 2018-01-27 LAB — COMPREHENSIVE METABOLIC PANEL
ALBUMIN: 3.4 g/dL — AB (ref 3.5–5.0)
ALT: 16 U/L (ref 0–44)
ANION GAP: 8 (ref 5–15)
AST: 15 U/L (ref 15–41)
Alkaline Phosphatase: 42 U/L (ref 38–126)
BUN: 15 mg/dL (ref 8–23)
CO2: 26 mmol/L (ref 22–32)
CREATININE: 0.79 mg/dL (ref 0.61–1.24)
Calcium: 8.8 mg/dL — ABNORMAL LOW (ref 8.9–10.3)
Chloride: 104 mmol/L (ref 98–111)
GFR calc Af Amer: >60 mL/min (ref >60)
GFR calc non Af Amer: >60 mL/min (ref >60)
GLUCOSE: 118 mg/dL — AB (ref 70–99)
POTASSIUM: 3.9 mmol/L (ref 3.5–5.1)
SODIUM: 138 mmol/L (ref 135–145)
Total Bilirubin: 0.8 mg/dL (ref 0.3–1.2)
Total Protein: 5.8 g/dL — ABNORMAL LOW (ref 6.5–8.1)

## 2018-01-27 LAB — CBC WITH DIFFERENTIAL (CANCER CENTER ONLY)
BASOS PCT: 1 %
Basophils Absolute: 0 10*3/uL (ref 0.0–0.1)
EOS ABS: 0.4 10*3/uL (ref 0.0–0.5)
Eosinophils Relative: 6 %
HCT: 41.4 % (ref 38.7–49.9)
HEMOGLOBIN: 14.5 g/dL (ref 13.0–17.1)
Lymphocytes Relative: 14 %
Lymphs Abs: 0.8 10*3/uL — ABNORMAL LOW (ref 0.9–3.3)
MCH: 35.8 pg — ABNORMAL HIGH (ref 28.0–33.4)
MCHC: 35 g/dL (ref 32.0–35.9)
MCV: 102.2 fL — ABNORMAL HIGH (ref 82.0–98.0)
MONO ABS: 1.6 10*3/uL — AB (ref 0.1–0.9)
MONOS PCT: 27 %
Neutro Abs: 3.1 10*3/uL (ref 1.5–6.5)
Neutrophils Relative %: 52 %
Platelet Count: 119 10*3/uL — ABNORMAL LOW (ref 145–400)
RBC: 4.05 MIL/uL — ABNORMAL LOW (ref 4.20–5.70)
RDW: 13 % (ref 11.1–15.7)
WBC Count: 5.9 10*3/uL (ref 4.0–10.0)

## 2018-01-27 MED ORDER — PROCHLORPERAZINE MALEATE 10 MG PO TABS
10.0000 mg | ORAL_TABLET | Freq: Once | ORAL | Status: AC
  Administered 2018-01-27: 10 mg via ORAL

## 2018-01-27 MED ORDER — FAMOTIDINE IN NACL 20-0.9 MG/50ML-% IV SOLN
20.0000 mg | Freq: Once | INTRAVENOUS | Status: AC
  Administered 2018-01-27: 20 mg via INTRAVENOUS

## 2018-01-27 MED ORDER — DEXAMETHASONE SODIUM PHOSPHATE 10 MG/ML IJ SOLN
INTRAMUSCULAR | Status: AC
  Filled 2018-01-27: qty 1

## 2018-01-27 MED ORDER — SODIUM CHLORIDE 0.9 % IV SOLN
800.0000 mg | Freq: Once | INTRAVENOUS | Status: AC
  Administered 2018-01-27: 800 mg via INTRAVENOUS
  Filled 2018-01-27: qty 32

## 2018-01-27 MED ORDER — ACETAMINOPHEN 325 MG PO TABS
ORAL_TABLET | ORAL | Status: AC
  Filled 2018-01-27: qty 2

## 2018-01-27 MED ORDER — DIPHENHYDRAMINE HCL 25 MG PO CAPS
50.0000 mg | ORAL_CAPSULE | Freq: Once | ORAL | Status: AC
  Administered 2018-01-27: 50 mg via ORAL

## 2018-01-27 MED ORDER — HEPARIN SOD (PORK) LOCK FLUSH 100 UNIT/ML IV SOLN
500.0000 [IU] | Freq: Once | INTRAVENOUS | Status: AC
  Administered 2018-01-27: 500 [IU] via INTRAVENOUS
  Filled 2018-01-27: qty 5

## 2018-01-27 MED ORDER — FAMOTIDINE IN NACL 20-0.9 MG/50ML-% IV SOLN
INTRAVENOUS | Status: AC
  Filled 2018-01-27: qty 50

## 2018-01-27 MED ORDER — DIPHENHYDRAMINE HCL 25 MG PO CAPS
ORAL_CAPSULE | ORAL | Status: AC
  Filled 2018-01-27: qty 2

## 2018-01-27 MED ORDER — PROCHLORPERAZINE MALEATE 10 MG PO TABS
ORAL_TABLET | ORAL | Status: AC
  Filled 2018-01-27: qty 1

## 2018-01-27 MED ORDER — POMALIDOMIDE 4 MG PO CAPS: 4.0000 mg | ORAL_CAPSULE | Freq: Every day | ORAL | 0 refills | Status: DC

## 2018-01-27 MED ORDER — DEXAMETHASONE SODIUM PHOSPHATE 10 MG/ML IJ SOLN
8.0000 mg | Freq: Once | INTRAMUSCULAR | Status: AC
  Administered 2018-01-27: 8 mg via INTRAVENOUS

## 2018-01-27 MED ORDER — ACETAMINOPHEN 325 MG PO TABS
650.0000 mg | ORAL_TABLET | Freq: Once | ORAL | Status: AC
  Administered 2018-01-27: 650 mg via ORAL

## 2018-01-27 MED ORDER — SODIUM CHLORIDE 0.9 % IV SOLN
Freq: Once | INTRAVENOUS | Status: AC
  Administered 2018-01-27: 12:00:00 via INTRAVENOUS
  Filled 2018-01-27: qty 250

## 2018-01-27 MED ORDER — SODIUM CHLORIDE 0.9% FLUSH
10.0000 mL | INTRAVENOUS | Status: DC | PRN
  Administered 2018-01-27: 10 mL via INTRAVENOUS
  Filled 2018-01-27: qty 10

## 2018-02-02 ENCOUNTER — Inpatient Hospital Stay: Payer: Medicare Other

## 2018-02-02 ENCOUNTER — Encounter: Payer: Self-pay | Admitting: Hematology & Oncology

## 2018-02-02 ENCOUNTER — Inpatient Hospital Stay (HOSPITAL_BASED_OUTPATIENT_CLINIC_OR_DEPARTMENT_OTHER): Payer: Medicare Other | Admitting: Hematology & Oncology

## 2018-02-02 ENCOUNTER — Other Ambulatory Visit: Payer: Self-pay

## 2018-02-02 VITALS — BP 110/59 | HR 64 | Temp 98.3°F | Wt 197.0 lb

## 2018-02-02 DIAGNOSIS — C9002 Multiple myeloma in relapse: Secondary | ICD-10-CM

## 2018-02-02 DIAGNOSIS — C9000 Multiple myeloma not having achieved remission: Secondary | ICD-10-CM

## 2018-02-02 DIAGNOSIS — Z5112 Encounter for antineoplastic immunotherapy: Secondary | ICD-10-CM | POA: Diagnosis not present

## 2018-02-02 LAB — CMP (CANCER CENTER ONLY)
ALK PHOS: 43 U/L (ref 26–84)
ALT: 24 U/L (ref 10–47)
ANION GAP: 0 — AB (ref 5–15)
AST: 20 U/L (ref 11–38)
Albumin: 3.3 g/dL — ABNORMAL LOW (ref 3.5–5.0)
BILIRUBIN TOTAL: 1.1 mg/dL (ref 0.2–1.6)
BUN: 14 mg/dL (ref 7–22)
CALCIUM: 8.5 mg/dL (ref 8.0–10.3)
CO2: 28 mmol/L (ref 18–33)
Chloride: 108 mmol/L (ref 98–108)
Creatinine: 1.1 mg/dL (ref 0.60–1.20)
Glucose, Bld: 173 mg/dL — ABNORMAL HIGH (ref 73–118)
Potassium: 4.4 mmol/L (ref 3.3–4.7)
Sodium: 136 mmol/L (ref 128–145)
TOTAL PROTEIN: 5.4 g/dL — AB (ref 6.4–8.1)

## 2018-02-02 LAB — CBC WITH DIFFERENTIAL (CANCER CENTER ONLY)
BASOS ABS: 0 10*3/uL (ref 0.0–0.1)
Basophils Relative: 1 %
Eosinophils Absolute: 0.2 10*3/uL (ref 0.0–0.5)
Eosinophils Relative: 5 %
HEMATOCRIT: 44.2 % (ref 38.7–49.9)
HEMOGLOBIN: 14.9 g/dL (ref 13.0–17.1)
LYMPHS PCT: 19 %
Lymphs Abs: 0.7 10*3/uL — ABNORMAL LOW (ref 0.9–3.3)
MCH: 34.9 pg — ABNORMAL HIGH (ref 28.0–33.4)
MCHC: 33.7 g/dL (ref 32.0–35.9)
MCV: 103.5 fL — AB (ref 82.0–98.0)
MONO ABS: 1.1 10*3/uL — AB (ref 0.1–0.9)
MONOS PCT: 31 %
Neutro Abs: 1.6 10*3/uL (ref 1.5–6.5)
Neutrophils Relative %: 44 %
Platelet Count: 93 10*3/uL — ABNORMAL LOW (ref 145–400)
RBC: 4.27 MIL/uL (ref 4.20–5.70)
RDW: 12.9 % (ref 11.1–15.7)
WBC Count: 3.7 10*3/uL — ABNORMAL LOW (ref 4.0–10.0)

## 2018-02-02 MED ORDER — ACETAMINOPHEN 325 MG PO TABS
ORAL_TABLET | ORAL | Status: AC
  Filled 2018-02-02: qty 2

## 2018-02-02 MED ORDER — SODIUM CHLORIDE 0.9 % IV SOLN
Freq: Once | INTRAVENOUS | Status: AC
  Administered 2018-02-02: 12:00:00 via INTRAVENOUS
  Filled 2018-02-02: qty 250

## 2018-02-02 MED ORDER — ACETAMINOPHEN 325 MG PO TABS
650.0000 mg | ORAL_TABLET | Freq: Once | ORAL | Status: AC
  Administered 2018-02-02: 650 mg via ORAL

## 2018-02-02 MED ORDER — FAMOTIDINE IN NACL 20-0.9 MG/50ML-% IV SOLN
20.0000 mg | Freq: Once | INTRAVENOUS | Status: AC
  Administered 2018-02-02: 20 mg via INTRAVENOUS

## 2018-02-02 MED ORDER — SODIUM CHLORIDE 0.9% FLUSH
10.0000 mL | INTRAVENOUS | Status: DC | PRN
  Administered 2018-02-02: 10 mL
  Filled 2018-02-02: qty 10

## 2018-02-02 MED ORDER — PROCHLORPERAZINE MALEATE 10 MG PO TABS
ORAL_TABLET | ORAL | Status: AC
  Filled 2018-02-02: qty 1

## 2018-02-02 MED ORDER — DIPHENHYDRAMINE HCL 25 MG PO CAPS
50.0000 mg | ORAL_CAPSULE | Freq: Once | ORAL | Status: AC
  Administered 2018-02-02: 50 mg via ORAL

## 2018-02-02 MED ORDER — DIPHENHYDRAMINE HCL 25 MG PO CAPS
ORAL_CAPSULE | ORAL | Status: AC
  Filled 2018-02-02: qty 2

## 2018-02-02 MED ORDER — FAMOTIDINE IN NACL 20-0.9 MG/50ML-% IV SOLN
INTRAVENOUS | Status: AC
  Filled 2018-02-02: qty 50

## 2018-02-02 MED ORDER — PROCHLORPERAZINE MALEATE 10 MG PO TABS
10.0000 mg | ORAL_TABLET | Freq: Once | ORAL | Status: AC
  Administered 2018-02-02: 10 mg via ORAL

## 2018-02-02 MED ORDER — SODIUM CHLORIDE 0.9 % IV SOLN
900.0000 mg | Freq: Once | INTRAVENOUS | Status: AC
  Administered 2018-02-02: 900 mg via INTRAVENOUS
  Filled 2018-02-02: qty 36

## 2018-02-02 MED ORDER — HEPARIN SOD (PORK) LOCK FLUSH 100 UNIT/ML IV SOLN
500.0000 [IU] | Freq: Once | INTRAVENOUS | Status: AC | PRN
  Administered 2018-02-02: 500 [IU]
  Filled 2018-02-02: qty 5

## 2018-02-02 NOTE — Patient Instructions (Signed)
Elotuzumab injection What is this medicine? ELOTUZUMAB (el oh tooz ue mab) is a monoclonal antibody. It is used to treat multiple myeloma. This medicine may be used for other purposes; ask your health care provider or pharmacist if you have questions. COMMON BRAND NAME(S): Empliciti What should I tell my health care provider before I take this medicine? They need to know if you have any of these conditions: -hepatic disease -infection -an unusual or allergic reaction to elotuzumab, other medicines, foods, dyes, or preservatives -pregnant or trying to get pregnant -breast-feeding How should I use this medicine? This medicine is for infusion into a vein. It is given by a health care professional in a hospital or clinic setting. Talk to your pediatrician regarding the use of this medicine in children. Special care may be needed. Overdosage: If you think you have taken too much of this medicine contact a poison control center or emergency room at once. NOTE: This medicine is only for you. Do not share this medicine with others. What if I miss a dose? Keep appointments for follow-up doses as directed. It is important not to miss your dose. Call your doctor or health care professional if you are unable to keep an appointment. What may interact with this medicine? Interactions have not been studied. Give your health care provider a list of all the medicines, herbs, non-prescription drugs, or dietary supplements you use. Also tell them if you smoke, drink alcohol, or use illegal drugs. Some items may interact with your medicine. This list may not describe all possible interactions. Give your health care provider a list of all the medicines, herbs, non-prescription drugs, or dietary supplements you use. Also tell them if you smoke, drink alcohol, or use illegal drugs. Some items may interact with your medicine. What should I watch for while using this medicine? This drug may make you feel generally  unwell. Report any side effects. Continue your course of treatment even though you feel ill unless your doctor tells you to stop. This medicine can cause serious allergic reactions. To reduce your risk you may need to take medicine before treatment with this medicine. Take your medicine as directed. You may need blood work done while you are taking this medicine. This medicine can affect the results of some tests used to determine treatment response; extra tests may be needed to evaluate response. Talk to your doctor about your risk of cancer. You may be more at risk for certain types of cancers if you take this medicine. Women should inform their doctor if they wish to become pregnant or think they might be pregnant. There is a potential for serious side effects to an unborn child. Talk to your health care professional or pharmacist for more information. Do not breast-feed an infant while taking this medicine. What side effects may I notice from receiving this medicine? Side effects that you should report to your doctor or health care professional as soon as possible: -allergic reactions like skin rash, itching or hives, swelling of the face, lips, or tongue -breathing problems -dizziness -lightheaded -signs and symptoms of infection like fever or chills; cough; sore throat; pain or trouble passing urine -signs and symptoms of liver injury like dark yellow or brown urine; general ill feeling or flu-like symptoms; light-colored stools; loss of appetite; nausea; right upper belly pain; unusually weak or tired; yellowing of the eyes or skin Side effects that usually do not require medical attention (report to your doctor or health care professional if they continue   or are bothersome): -constipation -decreased appetite -diarrhea -pain, tingling, numbness in the hands or feet -tiredness This list may not describe all possible side effects. Call your doctor for medical advice about side effects. You  may report side effects to FDA at 1-800-FDA-1088. Where should I keep my medicine? Keep out of the reach of children. This drug is given in a hospital or clinic and will not be stored at home. NOTE: This sheet is a summary. It may not cover all possible information. If you have questions about this medicine, talk to your doctor, pharmacist, or health care provider.  2018 Elsevier/Gold Standard (2015-06-15 10:43:53)  

## 2018-02-02 NOTE — Progress Notes (Signed)
Hematology and Oncology Follow Up Visit  Rickey [name] [id number] 1940-08-14 77 y.o. 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 Elotuzumab/Pomalidomide/decadron - start 01/12/2018    Interim History:  Rickey [name] is here today with his wife for follow-up.  Is doing quite well.  He is tolerated the elotuzimab quite nicely.  He has had no problems with the elotuzimab/ Pomalidomide combination.  He has had 3 weeks of the elotuzimab.    His Kappa Lightchain's have come down quite nicely.  We checked him couple weeks ago, the Kappa light chain was 6.4 mg/dL.  When we started the elotuzimab, the Kappa light chain was 13 mg/dL.  He has had no problems with cough.  There is no shortness of breath.  He has had no fever.  He said no bleeding.  He said no change in bowel or bladder habits.  His blood sugars have been doing pretty well.  Currently, his performance status is ECOG 1.    Medications:  Allergies as of 02/02/2018   No Known Allergies     Medication List        Accurate as of 02/02/18 11:37 AM. Always use your most recent med list.          acyclovir 400 MG tablet Commonly known as:  ZOVIRAX TAKE 1 TABLET(400 MG) BY MOUTH TWICE DAILY. 90 day supply   amLODipine 2.5 MG tablet Commonly known as:  NORVASC Take 2.5 mg by mouth daily.   aspirin 81 MG tablet Take 81 mg by mouth daily.   atorvastatin 10 MG tablet Commonly known as:  LIPITOR Take 10 mg by mouth daily.   calcium-vitamin D 250-125 MG-UNIT tablet Commonly known as:  OSCAL Take 1 tablet by mouth daily.   cycloSPORINE 0.05 % ophthalmic emulsion Commonly known as:  RESTASIS Place 1 drop into both eyes 2 (two) times daily.   fish oil-omega-3 fatty  acids 1000 MG capsule Take 1 g by mouth 2 (two) times daily.   INSULIN LISP & LISP PROT (HUM) [place]   insulin lispro 100 UNIT/ML injection Commonly known as:  HUMALOG Inject into the skin. Insulin pump   irbesartan-hydrochlorothiazide 300-12.5 MG tablet Commonly known as:  AVALIDE Take 1 tablet by mouth daily.   lidocaine-prilocaine cream Commonly known as:  EMLA Apply 1 application topically as needed.   montelukast 10 MG tablet Commonly known as:  SINGULAIR Take 10 mg by mouth at bedtime.   ondansetron 4 MG disintegrating tablet Commonly known as:  ZOFRAN-ODT Take 4 mg by mouth 4 (four) times daily as needed for nausea or vomiting.   pomalidomide 4 MG capsule Commonly known as:  POMALYST Take 1 capsule (4 mg total) by mouth daily. Take with water on days 1-21. Repeat every 28 days. [id number]   Vitamin D 2000 units Caps Take by mouth every morning.   XIFAXAN 200 MG tablet Generic drug:  rifaximin Take 200 mg by mouth 3 (three) times daily as needed.       Allergies: No Known Allergies  Past Medical History, Surgical history, Social history, and Family History were reviewed and updated.  Review of Systems: Review of Systems  Constitutional: Negative.   HENT: Negative.   Eyes: Negative.  Respiratory: Negative.   Cardiovascular: Negative.   Gastrointestinal: Negative.   Genitourinary: Negative.   Musculoskeletal: Negative.   Skin: Negative.   Neurological: Negative.   Endo/Heme/Allergies: Negative.   Psychiatric/Behavioral: Negative.      Physical Exam:  weight is 197 lb (89.4 kg). His oral temperature is 98.3 F (36.8 C). His blood pressure is 110/59 (abnormal) and his pulse is 64. His oxygen saturation is 99%.   Wt Readings from Last 3 Encounters:  02/02/18 197 lb (89.4 kg)  01/15/18 190 lb (86.2 kg)  01/12/18 195 lb 8 oz (88.7 kg)    Physical Exam  Constitutional: He is oriented to person, place, and time.  HENT:  Head: Normocephalic and  atraumatic.  Mouth/Throat: Oropharynx is clear and moist.  Eyes: Pupils are equal, round, and reactive to light. EOM are normal.  Neck: Normal range of motion.  Cardiovascular: Normal rate, regular rhythm and normal heart sounds.  Pulmonary/Chest: Effort normal and breath sounds normal.  Abdominal: Soft. Bowel sounds are normal.  Musculoskeletal: Normal range of motion. He exhibits no edema, tenderness or deformity.  Lymphadenopathy:    He has no cervical adenopathy.  Neurological: He is alert and oriented to person, place, and time.  Skin: Skin is warm and dry. No rash noted. No erythema.  Psychiatric: He has a normal mood and affect. His behavior is normal. Judgment and thought content normal.  Vitals reviewed.    Lab Results  Component Value Date   WBC 3.7 (L) 02/02/2018   HGB 14.9 02/02/2018   HCT 44.2 02/02/2018   MCV 103.5 (H) 02/02/2018   PLT 93 (L) 02/02/2018   No results found for: FERRITIN, IRON, TIBC, UIBC, IRONPCTSAT Lab Results  Component Value Date   RBC 4.27 02/02/2018   Lab Results  Component Value Date   KPAFRELGTCHN 64.4 (H) 01/19/2018   LAMBDASER 2.1 (L) 01/19/2018   KAPLAMBRATIO 30.67 (H) 01/19/2018   Lab Results  Component Value Date   IGGSERUM 468 (L) 01/19/2018   IGA 44 (L) 01/19/2018   IGMSERUM 6 (L) 01/19/2018   Lab Results  Component Value Date   TOTALPROTELP 5.5 (L) 01/12/2018   ALBUMINELP 3.6 01/12/2018   A1GS 0.2 01/12/2018   A2GS 0.6 01/12/2018   BETS 0.7 01/12/2018   BETA2SER 0.3 04/19/2015   GAMS 0.4 01/12/2018   MSPIKE Not Observed 01/12/2018   SPEI Comment 12/05/2017     Chemistry      Component Value Date/Time   NA 136 02/02/2018 1000   NA 142 04/23/2017 1431   NA 139 07/18/2016 1135   K 4.4 02/02/2018 1000   K 4.0 04/23/2017 1431   K 4.4 07/18/2016 1135   CL 108 02/02/2018 1000   CL 105 04/23/2017 1431   CO2 28 02/02/2018 1000   CO2 28 04/23/2017 1431   CO2 27 07/18/2016 1135   BUN 14 02/02/2018 1000   BUN 20  04/23/2017 1431   BUN 12.2 07/18/2016 1135   CREATININE 1.10 02/02/2018 1000   CREATININE 1.2 04/23/2017 1431   CREATININE 0.9 07/18/2016 1135      Component Value Date/Time   CALCIUM 8.5 02/02/2018 1000   CALCIUM 9.3 04/23/2017 1431   CALCIUM 8.9 07/18/2016 1135   ALKPHOS 43 02/02/2018 1000   ALKPHOS 64 04/23/2017 1431   ALKPHOS 58 07/18/2016 1135   AST 20 02/02/2018 1000   AST 15 07/18/2016 1135   ALT 24 02/02/2018 1000   ALT 22 04/23/2017 1431   ALT 20 07/18/2016 1135  BILITOT 1.1 02/02/2018 1000   BILITOT 1.24 (H) 07/18/2016 1135      Impression and Plan: Rickey [name] is a very pleasant 77 yo caucasian gentleman with 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   Hopefully, we can still get more mileage out of the elotuzimab and see his Kappa Lightchain keep dropping.  His white cell count plan to count a little bit lower.  However, this would not be a problem for me.  I will plan to see him back in another month.  By then, he will be at the end of the 8-week cycle.  Hopefully, we will then be able to go every other week.   [name], MD 9/9/201911:37 AM

## 2018-02-02 NOTE — Progress Notes (Signed)
Ok to treat with todays labs per dr Ennever 

## 2018-02-03 LAB — KAPPA/LAMBDA LIGHT CHAINS
KAPPA FREE LGHT CHN: 57.6 mg/L — AB (ref 3.3–19.4)
Kappa, lambda light chain ratio: 18.58 — ABNORMAL HIGH (ref 0.26–1.65)
LAMDA FREE LIGHT CHAINS: 3.1 mg/L — AB (ref 5.7–26.3)

## 2018-02-03 LAB — IGG, IGA, IGM
IGA: 41 mg/dL — AB (ref 61–437)
IGG (IMMUNOGLOBIN G), SERUM: 448 mg/dL — AB (ref 700–1600)
IGM (IMMUNOGLOBULIN M), SRM: 9 mg/dL — AB (ref 15–143)

## 2018-02-04 ENCOUNTER — Telehealth: Payer: Self-pay | Admitting: *Deleted

## 2018-02-04 NOTE — Telephone Encounter (Addendum)
Patient is aware of results.   ----- Message from [name], MD sent at 02/04/2018  5:43 AM EDT ----- Call - the kappa light chain is still lower!!!  Now is 57!!!  pete

## 2018-02-06 ENCOUNTER — Other Ambulatory Visit: Payer: Self-pay | Admitting: *Deleted

## 2018-02-06 DIAGNOSIS — C9000 Multiple myeloma not having achieved remission: Secondary | ICD-10-CM

## 2018-02-09 ENCOUNTER — Inpatient Hospital Stay: Payer: Medicare Other

## 2018-02-09 VITALS — BP 133/63 | HR 73 | Temp 97.5°F | Resp 16

## 2018-02-09 DIAGNOSIS — C9000 Multiple myeloma not having achieved remission: Secondary | ICD-10-CM

## 2018-02-09 DIAGNOSIS — C9002 Multiple myeloma in relapse: Secondary | ICD-10-CM | POA: Diagnosis not present

## 2018-02-09 DIAGNOSIS — Z5112 Encounter for antineoplastic immunotherapy: Secondary | ICD-10-CM | POA: Diagnosis not present

## 2018-02-09 LAB — CMP (CANCER CENTER ONLY)
ALBUMIN: 3.5 g/dL (ref 3.5–5.0)
ALT: 29 U/L (ref 10–47)
AST: 25 U/L (ref 11–38)
Alkaline Phosphatase: 50 U/L (ref 26–84)
Anion gap: 2 — ABNORMAL LOW (ref 5–15)
BUN: 16 mg/dL (ref 7–22)
CALCIUM: 9.2 mg/dL (ref 8.0–10.3)
CO2: 28 mmol/L (ref 18–33)
CREATININE: 0.8 mg/dL (ref 0.60–1.20)
Chloride: 108 mmol/L (ref 98–108)
GLUCOSE: 170 mg/dL — AB (ref 73–118)
Potassium: 4.5 mmol/L (ref 3.3–4.7)
Sodium: 138 mmol/L (ref 128–145)
TOTAL PROTEIN: 5.7 g/dL — AB (ref 6.4–8.1)
Total Bilirubin: 1.1 mg/dL (ref 0.2–1.6)

## 2018-02-09 LAB — CBC WITH DIFFERENTIAL (CANCER CENTER ONLY)
BASOS ABS: 0.1 10*3/uL (ref 0.0–0.1)
BASOS PCT: 3 %
Eosinophils Absolute: 0.1 10*3/uL (ref 0.0–0.5)
Eosinophils Relative: 3 %
HCT: 43.7 % (ref 38.7–49.9)
Hemoglobin: 15.3 g/dL (ref 13.0–17.1)
Lymphocytes Relative: 34 %
Lymphs Abs: 0.9 10*3/uL (ref 0.9–3.3)
MCH: 35 pg — ABNORMAL HIGH (ref 28.0–33.4)
MCHC: 35 g/dL (ref 32.0–35.9)
MCV: 100 fL — ABNORMAL HIGH (ref 82.0–98.0)
MONO ABS: 0.6 10*3/uL (ref 0.1–0.9)
Monocytes Relative: 23 %
NEUTROS PCT: 37 %
Neutro Abs: 1.1 10*3/uL — ABNORMAL LOW (ref 1.5–6.5)
Platelet Count: 124 10*3/uL — ABNORMAL LOW (ref 145–400)
RBC: 4.37 MIL/uL (ref 4.20–5.70)
RDW: 12.8 % (ref 11.1–15.7)
WBC: 2.8 10*3/uL — AB (ref 4.0–10.0)

## 2018-02-09 MED ORDER — DEXAMETHASONE SODIUM PHOSPHATE 10 MG/ML IJ SOLN
INTRAMUSCULAR | Status: AC
  Filled 2018-02-09: qty 1

## 2018-02-09 MED ORDER — SODIUM CHLORIDE 0.9 % IV SOLN
Freq: Once | INTRAVENOUS | Status: AC
  Administered 2018-02-09: 12:00:00 via INTRAVENOUS
  Filled 2018-02-09: qty 250

## 2018-02-09 MED ORDER — DIPHENHYDRAMINE HCL 25 MG PO CAPS
ORAL_CAPSULE | ORAL | Status: AC
  Filled 2018-02-09: qty 2

## 2018-02-09 MED ORDER — DEXAMETHASONE SODIUM PHOSPHATE 10 MG/ML IJ SOLN
8.0000 mg | Freq: Once | INTRAMUSCULAR | Status: AC
  Administered 2018-02-09: 8 mg via INTRAVENOUS

## 2018-02-09 MED ORDER — FAMOTIDINE IN NACL 20-0.9 MG/50ML-% IV SOLN
20.0000 mg | Freq: Once | INTRAVENOUS | Status: AC
  Administered 2018-02-09: 20 mg via INTRAVENOUS

## 2018-02-09 MED ORDER — HEPARIN SOD (PORK) LOCK FLUSH 100 UNIT/ML IV SOLN
500.0000 [IU] | Freq: Once | INTRAVENOUS | Status: AC | PRN
  Administered 2018-02-09: 500 [IU]
  Filled 2018-02-09: qty 5

## 2018-02-09 MED ORDER — PROCHLORPERAZINE MALEATE 10 MG PO TABS
ORAL_TABLET | ORAL | Status: AC
  Filled 2018-02-09: qty 1

## 2018-02-09 MED ORDER — SODIUM CHLORIDE 0.9% FLUSH
10.0000 mL | INTRAVENOUS | Status: DC | PRN
  Administered 2018-02-09: 10 mL
  Filled 2018-02-09: qty 10

## 2018-02-09 MED ORDER — FAMOTIDINE IN NACL 20-0.9 MG/50ML-% IV SOLN
INTRAVENOUS | Status: AC
  Filled 2018-02-09: qty 50

## 2018-02-09 MED ORDER — SODIUM CHLORIDE 0.9 % IV SOLN
10.5000 mg/kg | Freq: Once | INTRAVENOUS | Status: AC
  Administered 2018-02-09: 900 mg via INTRAVENOUS
  Filled 2018-02-09: qty 36

## 2018-02-09 MED ORDER — PROCHLORPERAZINE MALEATE 10 MG PO TABS
10.0000 mg | ORAL_TABLET | Freq: Once | ORAL | Status: AC
  Administered 2018-02-09: 10 mg via ORAL

## 2018-02-09 MED ORDER — ACETAMINOPHEN 325 MG PO TABS
ORAL_TABLET | ORAL | Status: AC
  Filled 2018-02-09: qty 2

## 2018-02-09 MED ORDER — ACETAMINOPHEN 325 MG PO TABS
650.0000 mg | ORAL_TABLET | Freq: Once | ORAL | Status: AC
  Administered 2018-02-09: 650 mg via ORAL

## 2018-02-09 MED ORDER — DIPHENHYDRAMINE HCL 25 MG PO CAPS
50.0000 mg | ORAL_CAPSULE | Freq: Once | ORAL | Status: AC
  Administered 2018-02-09: 50 mg via ORAL

## 2018-02-09 NOTE — Progress Notes (Signed)
OK to treat with ANC of 1.1 per VOV Dr Marin Olp. dph

## 2018-02-09 NOTE — Patient Instructions (Signed)
Elotuzumab injection What is this medicine? ELOTUZUMAB (el oh tooz ue mab) is a monoclonal antibody. It is used to treat multiple myeloma. This medicine may be used for other purposes; ask your health care provider or pharmacist if you have questions. COMMON BRAND NAME(S): Empliciti What should I tell my health care provider before I take this medicine? They need to know if you have any of these conditions: -hepatic disease -infection -an unusual or allergic reaction to elotuzumab, other medicines, foods, dyes, or preservatives -pregnant or trying to get pregnant -breast-feeding How should I use this medicine? This medicine is for infusion into a vein. It is given by a health care professional in a hospital or clinic setting. Talk to your pediatrician regarding the use of this medicine in children. Special care may be needed. Overdosage: If you think you have taken too much of this medicine contact a poison control center or emergency room at once. NOTE: This medicine is only for you. Do not share this medicine with others. What if I miss a dose? Keep appointments for follow-up doses as directed. It is important not to miss your dose. Call your doctor or health care professional if you are unable to keep an appointment. What may interact with this medicine? Interactions have not been studied. Give your health care provider a list of all the medicines, herbs, non-prescription drugs, or dietary supplements you use. Also tell them if you smoke, drink alcohol, or use illegal drugs. Some items may interact with your medicine. This list may not describe all possible interactions. Give your health care provider a list of all the medicines, herbs, non-prescription drugs, or dietary supplements you use. Also tell them if you smoke, drink alcohol, or use illegal drugs. Some items may interact with your medicine. What should I watch for while using this medicine? This drug may make you feel generally  unwell. Report any side effects. Continue your course of treatment even though you feel ill unless your doctor tells you to stop. This medicine can cause serious allergic reactions. To reduce your risk you may need to take medicine before treatment with this medicine. Take your medicine as directed. You may need blood work done while you are taking this medicine. This medicine can affect the results of some tests used to determine treatment response; extra tests may be needed to evaluate response. Talk to your doctor about your risk of cancer. You may be more at risk for certain types of cancers if you take this medicine. Women should inform their doctor if they wish to become pregnant or think they might be pregnant. There is a potential for serious side effects to an unborn child. Talk to your health care professional or pharmacist for more information. Do not breast-feed an infant while taking this medicine. What side effects may I notice from receiving this medicine? Side effects that you should report to your doctor or health care professional as soon as possible: -allergic reactions like skin rash, itching or hives, swelling of the face, lips, or tongue -breathing problems -dizziness -lightheaded -signs and symptoms of infection like fever or chills; cough; sore throat; pain or trouble passing urine -signs and symptoms of liver injury like dark yellow or brown urine; general ill feeling or flu-like symptoms; light-colored stools; loss of appetite; nausea; right upper belly pain; unusually weak or tired; yellowing of the eyes or skin Side effects that usually do not require medical attention (report to your doctor or health care professional if they continue   or are bothersome): -constipation -decreased appetite -diarrhea -pain, tingling, numbness in the hands or feet -tiredness This list may not describe all possible side effects. Call your doctor for medical advice about side effects. You  may report side effects to FDA at 1-800-FDA-1088. Where should I keep my medicine? Keep out of the reach of children. This drug is given in a hospital or clinic and will not be stored at home. NOTE: This sheet is a summary. It may not cover all possible information. If you have questions about this medicine, talk to your doctor, pharmacist, or health care provider.  2018 Elsevier/Gold Standard (2015-06-15 10:43:53)  

## 2018-02-09 NOTE — Patient Instructions (Signed)
Implanted Port Home Guide An implanted port is a type of central line that is placed under the skin. Central lines are used to provide IV access when treatment or nutrition needs to be given through a person's veins. Implanted ports are used for long-term IV access. An implanted port may be placed because:  You need IV medicine that would be irritating to the small veins in your hands or arms.  You need long-term IV medicines, such as antibiotics.  You need IV nutrition for a long period.  You need frequent blood draws for lab tests.  You need dialysis.  Implanted ports are usually placed in the chest area, but they can also be placed in the upper arm, the abdomen, or the leg. An implanted port has two main parts:  Reservoir. The reservoir is round and will appear as a small, raised area under your skin. The reservoir is the part where a needle is inserted to give medicines or draw blood.  Catheter. The catheter is a thin, flexible tube that extends from the reservoir. The catheter is placed into a large vein. Medicine that is inserted into the reservoir goes into the catheter and then into the vein.  How will I care for my incision site? Do not get the incision site wet. Bathe or shower as directed by your health care provider. How is my port accessed? Special steps must be taken to access the port:  Before the port is accessed, a numbing cream can be placed on the skin. This helps numb the skin over the port site.  Your health care provider uses a sterile technique to access the port. ? Your health care provider must put on a mask and sterile gloves. ? The skin over your port is cleaned carefully with an antiseptic and allowed to dry. ? The port is gently pinched between sterile gloves, and a needle is inserted into the port.  Only "non-coring" port needles should be used to access the port. Once the port is accessed, a blood return should be checked. This helps ensure that the port  is in the vein and is not clogged.  If your port needs to remain accessed for a constant infusion, a clear (transparent) bandage will be placed over the needle site. The bandage and needle will need to be changed every week, or as directed by your health care provider.  Keep the bandage covering the needle clean and dry. Do not get it wet. Follow your health care provider's instructions on how to take a shower or bath while the port is accessed.  If your port does not need to stay accessed, no bandage is needed over the port.  What is flushing? Flushing helps keep the port from getting clogged. Follow your health care provider's instructions on how and when to flush the port. Ports are usually flushed with saline solution or a medicine called heparin. The need for flushing will depend on how the port is used.  If the port is used for intermittent medicines or blood draws, the port will need to be flushed: ? After medicines have been given. ? After blood has been drawn. ? As part of routine maintenance.  If a constant infusion is running, the port may not need to be flushed.  How long will my port stay implanted? The port can stay in for as long as your health care provider thinks it is needed. When it is time for the port to come out, surgery will be   done to remove it. The procedure is similar to the one performed when the port was put in. When should I seek immediate medical care? When you have an implanted port, you should seek immediate medical care if:  You notice a bad smell coming from the incision site.  You have swelling, redness, or drainage at the incision site.  You have more swelling or pain at the port site or the surrounding area.  You have a fever that is not controlled with medicine.  This information is not intended to replace advice given to you by your health care provider. Make sure you discuss any questions you have with your health care provider. Document  Released: 05/13/2005 Document Revised: 10/19/2015 Document Reviewed: 01/18/2013 Elsevier Interactive Patient Education  2017 [employer] Inc[employer]  

## 2018-02-12 DIAGNOSIS — E109 Type 1 diabetes mellitus without complications: Secondary | ICD-10-CM | POA: Diagnosis not present

## 2018-02-12 DIAGNOSIS — C9000 Multiple myeloma not having achieved remission: Secondary | ICD-10-CM | POA: Diagnosis not present

## 2018-02-12 DIAGNOSIS — I10 Essential (primary) hypertension: Secondary | ICD-10-CM | POA: Diagnosis not present

## 2018-02-12 DIAGNOSIS — Z6827 Body mass index (BMI) 27.0-27.9, adult: Secondary | ICD-10-CM | POA: Diagnosis not present

## 2018-02-12 DIAGNOSIS — A3790 Whooping cough, unspecified species without pneumonia: Secondary | ICD-10-CM | POA: Diagnosis not present

## 2018-02-13 DIAGNOSIS — Z961 Presence of intraocular lens: Secondary | ICD-10-CM | POA: Diagnosis not present

## 2018-02-13 DIAGNOSIS — E103211 Type 1 diabetes mellitus with mild nonproliferative diabetic retinopathy with macular edema, right eye: Secondary | ICD-10-CM | POA: Diagnosis not present

## 2018-02-13 DIAGNOSIS — H35371 Puckering of macula, right eye: Secondary | ICD-10-CM | POA: Diagnosis not present

## 2018-02-16 ENCOUNTER — Inpatient Hospital Stay: Payer: Medicare Other

## 2018-02-16 ENCOUNTER — Inpatient Hospital Stay (HOSPITAL_BASED_OUTPATIENT_CLINIC_OR_DEPARTMENT_OTHER): Payer: Medicare Other | Admitting: Hematology & Oncology

## 2018-02-16 VITALS — BP 122/62 | HR 65 | Temp 97.6°F | Resp 17 | Wt 203.0 lb

## 2018-02-16 DIAGNOSIS — C9002 Multiple myeloma in relapse: Secondary | ICD-10-CM | POA: Diagnosis not present

## 2018-02-16 DIAGNOSIS — C9000 Multiple myeloma not having achieved remission: Secondary | ICD-10-CM

## 2018-02-16 DIAGNOSIS — Z5112 Encounter for antineoplastic immunotherapy: Secondary | ICD-10-CM | POA: Diagnosis not present

## 2018-02-16 LAB — CMP (CANCER CENTER ONLY)
ALT: 26 U/L (ref 10–47)
AST: 22 U/L (ref 11–38)
Albumin: 3.3 g/dL — ABNORMAL LOW (ref 3.5–5.0)
Alkaline Phosphatase: 51 U/L (ref 26–84)
Anion gap: 0 — ABNORMAL LOW (ref 5–15)
BUN: 19 mg/dL (ref 7–22)
CHLORIDE: 109 mmol/L — AB (ref 98–108)
CO2: 29 mmol/L (ref 18–33)
Calcium: 8.7 mg/dL (ref 8.0–10.3)
Creatinine: 1 mg/dL (ref 0.60–1.20)
GLUCOSE: 180 mg/dL — AB (ref 73–118)
POTASSIUM: 3.8 mmol/L (ref 3.3–4.7)
SODIUM: 136 mmol/L (ref 128–145)
TOTAL PROTEIN: 5.4 g/dL — AB (ref 6.4–8.1)
Total Bilirubin: 1.1 mg/dL (ref 0.2–1.6)

## 2018-02-16 LAB — CBC WITH DIFFERENTIAL (CANCER CENTER ONLY)
Basophils Absolute: 0.1 10*3/uL (ref 0.0–0.1)
Basophils Relative: 3 %
EOS ABS: 0.1 10*3/uL (ref 0.0–0.5)
EOS PCT: 3 %
HCT: 41.6 % (ref 38.7–49.9)
Hemoglobin: 14.6 g/dL (ref 13.0–17.1)
LYMPHS ABS: 0.8 10*3/uL — AB (ref 0.9–3.3)
Lymphocytes Relative: 24 %
MCH: 35.5 pg — AB (ref 28.0–33.4)
MCHC: 35.1 g/dL (ref 32.0–35.9)
MCV: 101.2 fL — ABNORMAL HIGH (ref 82.0–98.0)
Monocytes Absolute: 0.6 10*3/uL (ref 0.1–0.9)
Monocytes Relative: 17 %
Neutro Abs: 1.8 10*3/uL (ref 1.5–6.5)
Neutrophils Relative %: 53 %
PLATELETS: 109 10*3/uL — AB (ref 145–400)
RBC: 4.11 MIL/uL — AB (ref 4.20–5.70)
RDW: 13.4 % (ref 11.1–15.7)
WBC: 3.3 10*3/uL — AB (ref 4.0–10.0)

## 2018-02-16 MED ORDER — SODIUM CHLORIDE 0.9 % IV SOLN
Freq: Once | INTRAVENOUS | Status: AC
  Administered 2018-02-16: 13:00:00 via INTRAVENOUS
  Filled 2018-02-16: qty 250

## 2018-02-16 MED ORDER — PROCHLORPERAZINE MALEATE 10 MG PO TABS
ORAL_TABLET | ORAL | Status: AC
  Filled 2018-02-16: qty 1

## 2018-02-16 MED ORDER — DIPHENHYDRAMINE HCL 25 MG PO CAPS
50.0000 mg | ORAL_CAPSULE | Freq: Once | ORAL | Status: AC
  Administered 2018-02-16: 50 mg via ORAL

## 2018-02-16 MED ORDER — DIPHENHYDRAMINE HCL 25 MG PO CAPS
ORAL_CAPSULE | ORAL | Status: AC
  Filled 2018-02-16: qty 2

## 2018-02-16 MED ORDER — SODIUM CHLORIDE 0.9% FLUSH
10.0000 mL | INTRAVENOUS | Status: DC | PRN
  Administered 2018-02-16: 10 mL
  Filled 2018-02-16: qty 10

## 2018-02-16 MED ORDER — HEPARIN SOD (PORK) LOCK FLUSH 100 UNIT/ML IV SOLN
500.0000 [IU] | Freq: Once | INTRAVENOUS | Status: AC | PRN
  Administered 2018-02-16: 500 [IU]
  Filled 2018-02-16: qty 5

## 2018-02-16 MED ORDER — DEXAMETHASONE SODIUM PHOSPHATE 10 MG/ML IJ SOLN
INTRAMUSCULAR | Status: AC
  Filled 2018-02-16: qty 1

## 2018-02-16 MED ORDER — ACETAMINOPHEN 325 MG PO TABS
650.0000 mg | ORAL_TABLET | Freq: Once | ORAL | Status: AC
  Administered 2018-02-16: 650 mg via ORAL

## 2018-02-16 MED ORDER — DEXAMETHASONE SODIUM PHOSPHATE 10 MG/ML IJ SOLN
8.0000 mg | Freq: Once | INTRAMUSCULAR | Status: AC
  Administered 2018-02-16: 8 mg via INTRAVENOUS

## 2018-02-16 MED ORDER — PROCHLORPERAZINE MALEATE 10 MG PO TABS
10.0000 mg | ORAL_TABLET | Freq: Once | ORAL | Status: AC
  Administered 2018-02-16: 10 mg via ORAL

## 2018-02-16 MED ORDER — FAMOTIDINE IN NACL 20-0.9 MG/50ML-% IV SOLN
20.0000 mg | Freq: Once | INTRAVENOUS | Status: AC
  Administered 2018-02-16: 20 mg via INTRAVENOUS

## 2018-02-16 MED ORDER — FAMOTIDINE IN NACL 20-0.9 MG/50ML-% IV SOLN
INTRAVENOUS | Status: AC
  Filled 2018-02-16: qty 50

## 2018-02-16 MED ORDER — SODIUM CHLORIDE 0.9 % IV SOLN
10.3000 mg/kg | Freq: Once | INTRAVENOUS | Status: AC
  Administered 2018-02-16: 900 mg via INTRAVENOUS
  Filled 2018-02-16: qty 36

## 2018-02-16 MED ORDER — ACETAMINOPHEN 325 MG PO TABS
ORAL_TABLET | ORAL | Status: AC
  Filled 2018-02-16: qty 2

## 2018-02-16 NOTE — Progress Notes (Signed)
Hematology and Oncology Follow Up Visit  DELAN KSIAZEK [id number] [birth year] 77 y.o. 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 Elotuzumab/Pomalidomide/decadron - start 01/12/2018    Interim History:  Mr. Inskeep is here today with his wife for follow-up.  [place]  They actually are on their way to the beach house tomorrow.  They will be gone for the rest the week.  He has responded to the elotuzimab/lenalidomide combination.  His last Kappa Lightchain was down to 6 mg/dL.  His blood sugars are doing well.  He has had no problems with nausea or vomiting.  He has had no diarrhea.  He has had no cough.  The whooping cough that he had seems to be under good control right now.  There is been no leg swelling.  Overall, his performance status is ECOG 1.    Medications:  Allergies as of 02/16/2018   No Known Allergies     Medication List        Accurate as of 02/16/18 12:04 PM. Always use your most recent med list.          acyclovir 400 MG tablet Commonly known as:  ZOVIRAX TAKE 1 TABLET(400 MG) BY MOUTH TWICE DAILY. 90 day supply   amLODipine 2.5 MG tablet Commonly known as:  NORVASC Take 2.5 mg by mouth daily.   aspirin 81 MG tablet Take 81 mg by mouth daily.   atorvastatin 10 MG tablet Commonly known as:  LIPITOR Take 10 mg by mouth daily.   calcium-vitamin D 250-125 MG-UNIT tablet Commonly known as:  OSCAL Take 1 tablet by mouth daily.   cycloSPORINE 0.05 % ophthalmic emulsion Commonly known as:  RESTASIS Place 1 drop into both eyes 2 (two) times daily.   fish oil-omega-3 fatty acids 1000 MG capsule Take 1 g by mouth 2 (two) times daily.   INSULIN  LISP & LISP PROT (HUM) [place]   insulin lispro 100 UNIT/ML injection Commonly known as:  HUMALOG Inject into the skin. Insulin pump   irbesartan-hydrochlorothiazide 300-12.5 MG tablet Commonly known as:  AVALIDE Take 1 tablet by mouth daily.   lidocaine-prilocaine cream Commonly known as:  EMLA Apply 1 application topically as needed.   montelukast 10 MG tablet Commonly known as:  SINGULAIR Take 10 mg by mouth at bedtime.   ondansetron 4 MG disintegrating tablet Commonly known as:  ZOFRAN-ODT Take 4 mg by mouth 4 (four) times daily as needed for nausea or vomiting.   pomalidomide 4 MG capsule Commonly known as:  POMALYST Take 1 capsule (4 mg total) by mouth daily. Take with water on days 1-21. Repeat every 28 days. [id number]   Vitamin D 2000 units Caps Take by mouth every morning.   XIFAXAN 200 MG tablet Generic drug:  rifaximin Take 200 mg by mouth 3 (three) times daily as needed.       Allergies: No Known Allergies  Past Medical History, Surgical history, Social history, and Family History were reviewed and updated.  Review of Systems: Review of Systems  Constitutional: Negative.   HENT: Negative.   Eyes: Negative.   Respiratory: Negative.   Cardiovascular: Negative.   Gastrointestinal: Negative.  Genitourinary: Negative.   Musculoskeletal: Negative.   Skin: Negative.   Neurological: Negative.   Endo/Heme/Allergies: Negative.   Psychiatric/Behavioral: Negative.      Physical Exam:  weight is 203 lb (92.1 kg). His oral temperature is 97.6 F (36.4 C). His blood pressure is 122/62 and his pulse is 65. His respiration is 17 and oxygen saturation is 100%.   Wt Readings from Last 3 Encounters:  02/16/18 203 lb (92.1 kg)  02/02/18 197 lb (89.4 kg)  01/15/18 190 lb (86.2 kg)    Physical Exam  Constitutional: He is oriented to person, place, and time.  HENT:  Head: Normocephalic and atraumatic.  Mouth/Throat: Oropharynx is clear and moist.  Eyes:  Pupils are equal, round, and reactive to light. EOM are normal.  Neck: Normal range of motion.  Cardiovascular: Normal rate, regular rhythm and normal heart sounds.  Pulmonary/Chest: Effort normal and breath sounds normal.  Abdominal: Soft. Bowel sounds are normal.  Musculoskeletal: Normal range of motion. He exhibits no edema, tenderness or deformity.  Lymphadenopathy:    He has no cervical adenopathy.  Neurological: He is alert and oriented to person, place, and time.  Skin: Skin is warm and dry. No rash noted. No erythema.  Psychiatric: He has a normal mood and affect. His behavior is normal. Judgment and thought content normal.  Vitals reviewed.    Lab Results  Component Value Date   WBC 3.3 (L) 02/16/2018   HGB 14.6 02/16/2018   HCT 41.6 02/16/2018   MCV 101.2 (H) 02/16/2018   PLT 109 (L) 02/16/2018   No results found for: FERRITIN, IRON, TIBC, UIBC, IRONPCTSAT Lab Results  Component Value Date   RBC 4.11 (L) 02/16/2018   Lab Results  Component Value Date   KPAFRELGTCHN 57.6 (H) 02/02/2018   LAMBDASER 3.1 (L) 02/02/2018   KAPLAMBRATIO 18.58 (H) 02/02/2018   Lab Results  Component Value Date   IGGSERUM 448 (L) 02/02/2018   IGA 41 (L) 02/02/2018   IGMSERUM 9 (L) 02/02/2018   Lab Results  Component Value Date   TOTALPROTELP 5.5 (L) 01/12/2018   ALBUMINELP 3.6 01/12/2018   A1GS 0.2 01/12/2018   A2GS 0.6 01/12/2018   BETS 0.7 01/12/2018   BETA2SER 0.3 04/19/2015   GAMS 0.4 01/12/2018   MSPIKE Not Observed 01/12/2018   SPEI Comment 12/05/2017     Chemistry      Component Value Date/Time   NA 138 02/09/2018 1135   NA 142 04/23/2017 1431   NA 139 07/18/2016 1135   K 4.5 02/09/2018 1135   K 4.0 04/23/2017 1431   K 4.4 07/18/2016 1135   CL 108 02/09/2018 1135   CL 105 04/23/2017 1431   CO2 28 02/09/2018 1135   CO2 28 04/23/2017 1431   CO2 27 07/18/2016 1135   BUN 16 02/09/2018 1135   BUN 20 04/23/2017 1431   BUN 12.2 07/18/2016 1135   CREATININE 0.80  02/09/2018 1135   CREATININE 1.2 04/23/2017 1431   CREATININE 0.9 07/18/2016 1135      Component Value Date/Time   CALCIUM 9.2 02/09/2018 1135   CALCIUM 9.3 04/23/2017 1431   CALCIUM 8.9 07/18/2016 1135   ALKPHOS 50 02/09/2018 1135   ALKPHOS 64 04/23/2017 1431   ALKPHOS 58 07/18/2016 1135   AST 25 02/09/2018 1135   AST 15 07/18/2016 1135   ALT 29 02/09/2018 1135   ALT 22 04/23/2017 1431   ALT 20 07/18/2016 1135   BILITOT 1.1 02/09/2018 1135   BILITOT 1.24 (H) 07/18/2016  1135      Impression and Plan: Mr. Chancy is a very pleasant 77 yo caucasian gentleman with 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   Hopefully, we can still get more mileage out of the elotuzimab and see his Kappa Lightchain keep dropping.  I will plan to see him back in couple of weeks.  By then, he will be at the end of the 8-week cycle.  Hopefully, we will then be able to go every other week.   [name], MD 9/23/201912:04 PM

## 2018-02-16 NOTE — Patient Instructions (Signed)
[facility] Discharge Instructions for Patients Receiving Chemotherapy  Today you received the following chemotherapy agents: Empliciti  To help prevent nausea and vomiting after your treatment, we encourage you to take your nausea medication  as prescribed.    If you develop nausea and vomiting that is not controlled by your nausea medication, call the clinic.   BELOW ARE SYMPTOMS THAT SHOULD BE REPORTED IMMEDIATELY:  *FEVER GREATER THAN 100.5 F  *CHILLS WITH OR WITHOUT FEVER  NAUSEA AND VOMITING THAT IS NOT CONTROLLED WITH YOUR NAUSEA MEDICATION  *UNUSUAL SHORTNESS OF BREATH  *UNUSUAL BRUISING OR BLEEDING  TENDERNESS IN MOUTH AND THROAT WITH OR WITHOUT PRESENCE OF ULCERS  *URINARY PROBLEMS  *BOWEL PROBLEMS  UNUSUAL RASH Items with * indicate a potential emergency and should be followed up as soon as possible.  Feel free to call the clinic should you have any questions or concerns. The clinic phone number is [phone].  Please show the CHEMO ALERT CARD at check-in to the Emergency Department and triage nurse.   

## 2018-02-17 LAB — KAPPA/LAMBDA LIGHT CHAINS
Kappa free light chain: 61.7 mg/L — ABNORMAL HIGH (ref 3.3–19.4)
Kappa, lambda light chain ratio: 17.63 — ABNORMAL HIGH (ref 0.26–1.65)
Lambda free light chains: 3.5 mg/L — ABNORMAL LOW (ref 5.7–26.3)

## 2018-02-17 LAB — IGG, IGA, IGM
IgA: 39 mg/dL — ABNORMAL LOW (ref 61–437)
IgG (Immunoglobin G), Serum: 424 mg/dL — ABNORMAL LOW (ref 700–1600)
IgM (Immunoglobulin M), Srm: 5 mg/dL — ABNORMAL LOW (ref 15–143)

## 2018-02-18 ENCOUNTER — Telehealth: Payer: Self-pay

## 2018-02-18 LAB — PROTEIN ELECTROPHORESIS, SERUM, WITH REFLEX
A/G Ratio: 1.7 (ref 0.7–1.7)
ALBUMIN ELP: 3.2 g/dL (ref 2.9–4.4)
ALPHA-1-GLOBULIN: 0.2 g/dL (ref 0.0–0.4)
Alpha-2-Globulin: 0.6 g/dL (ref 0.4–1.0)
Beta Globulin: 0.7 g/dL (ref 0.7–1.3)
GAMMA GLOBULIN: 0.4 g/dL (ref 0.4–1.8)
Globulin, Total: 1.9 g/dL — ABNORMAL LOW (ref 2.2–3.9)
M-Spike, %: 0.1 g/dL — ABNORMAL HIGH
SPEP Interpretation: 0
TOTAL PROTEIN ELP: 5.1 g/dL — AB (ref 6.0–8.5)

## 2018-02-18 LAB — IMMUNOFIXATION REFLEX, SERUM
IgA: 38 mg/dL — ABNORMAL LOW (ref 61–437)
IgG (Immunoglobin G), Serum: 429 mg/dL — ABNORMAL LOW (ref 700–1600)
IgM (Immunoglobulin M), Srm: <5 mg/dL — ABNORMAL LOW (ref 15–143)

## 2018-02-18 NOTE — Telephone Encounter (Signed)
Patient aware his light chains drawn this week are stable per MD. No other questions or concerns voiced by patient at this time. He reports he is feeling well.

## 2018-02-20 ENCOUNTER — Other Ambulatory Visit: Payer: Self-pay | Admitting: *Deleted

## 2018-02-20 DIAGNOSIS — C9000 Multiple myeloma not having achieved remission: Secondary | ICD-10-CM

## 2018-02-23 ENCOUNTER — Inpatient Hospital Stay: Payer: Medicare Other

## 2018-02-23 ENCOUNTER — Other Ambulatory Visit: Payer: Self-pay

## 2018-02-23 VITALS — BP 110/59 | HR 81 | Temp 98.3°F | Resp 20

## 2018-02-23 DIAGNOSIS — C9000 Multiple myeloma not having achieved remission: Secondary | ICD-10-CM

## 2018-02-23 DIAGNOSIS — C9002 Multiple myeloma in relapse: Secondary | ICD-10-CM | POA: Diagnosis not present

## 2018-02-23 DIAGNOSIS — Z5112 Encounter for antineoplastic immunotherapy: Secondary | ICD-10-CM | POA: Diagnosis not present

## 2018-02-23 LAB — CBC WITH DIFFERENTIAL (CANCER CENTER ONLY)
BASOS ABS: 0.1 10*3/uL (ref 0.0–0.1)
Basophils Relative: 2 %
Eosinophils Absolute: 0.2 10*3/uL (ref 0.0–0.5)
Eosinophils Relative: 4 %
HEMATOCRIT: 43.6 % (ref 38.7–49.9)
HEMOGLOBIN: 14.6 g/dL (ref 13.0–17.1)
LYMPHS PCT: 21 %
Lymphs Abs: 1.1 10*3/uL (ref 0.9–3.3)
MCH: 34.4 pg — ABNORMAL HIGH (ref 28.0–33.4)
MCHC: 33.5 g/dL (ref 32.0–35.9)
MCV: 102.6 fL — ABNORMAL HIGH (ref 82.0–98.0)
Monocytes Absolute: 1.1 10*3/uL — ABNORMAL HIGH (ref 0.1–0.9)
Monocytes Relative: 21 %
Neutro Abs: 2.6 10*3/uL (ref 1.5–6.5)
Neutrophils Relative %: 52 %
Platelet Count: 98 10*3/uL — ABNORMAL LOW (ref 145–400)
RBC: 4.25 MIL/uL (ref 4.20–5.70)
RDW: 13.2 % (ref 11.1–15.7)
WBC Count: 5 10*3/uL (ref 4.0–10.0)

## 2018-02-23 LAB — CMP (CANCER CENTER ONLY)
ALK PHOS: 55 U/L (ref 26–84)
ALT: 24 U/L (ref 10–47)
AST: 21 U/L (ref 11–38)
Albumin: 3.3 g/dL — ABNORMAL LOW (ref 3.5–5.0)
Anion gap: 0 — ABNORMAL LOW (ref 5–15)
BILIRUBIN TOTAL: 1.3 mg/dL (ref 0.2–1.6)
BUN: 15 mg/dL (ref 7–22)
CALCIUM: 8.8 mg/dL (ref 8.0–10.3)
CO2: 26 mmol/L (ref 18–33)
CREATININE: 1 mg/dL (ref 0.60–1.20)
Chloride: 108 mmol/L (ref 98–108)
Glucose, Bld: 235 mg/dL — ABNORMAL HIGH (ref 73–118)
Potassium: 4.3 mmol/L (ref 3.3–4.7)
Sodium: 134 mmol/L (ref 128–145)
TOTAL PROTEIN: 5.3 g/dL — AB (ref 6.4–8.1)

## 2018-02-23 MED ORDER — HEPARIN SOD (PORK) LOCK FLUSH 100 UNIT/ML IV SOLN
500.0000 [IU] | Freq: Once | INTRAVENOUS | Status: AC | PRN
  Administered 2018-02-23: 500 [IU]
  Filled 2018-02-23: qty 5

## 2018-02-23 MED ORDER — SODIUM CHLORIDE 0.9% FLUSH
10.0000 mL | INTRAVENOUS | Status: DC | PRN
  Administered 2018-02-23: 10 mL
  Filled 2018-02-23: qty 10

## 2018-02-23 MED ORDER — DEXAMETHASONE SODIUM PHOSPHATE 10 MG/ML IJ SOLN
INTRAMUSCULAR | Status: AC
  Filled 2018-02-23: qty 1

## 2018-02-23 MED ORDER — FAMOTIDINE IN NACL 20-0.9 MG/50ML-% IV SOLN
INTRAVENOUS | Status: AC
  Filled 2018-02-23: qty 50

## 2018-02-23 MED ORDER — DEXAMETHASONE SODIUM PHOSPHATE 10 MG/ML IJ SOLN
8.0000 mg | Freq: Once | INTRAMUSCULAR | Status: AC
  Administered 2018-02-23: 8 mg via INTRAVENOUS

## 2018-02-23 MED ORDER — DIPHENHYDRAMINE HCL 25 MG PO CAPS
50.0000 mg | ORAL_CAPSULE | Freq: Once | ORAL | Status: AC
  Administered 2018-02-23: 50 mg via ORAL

## 2018-02-23 MED ORDER — SODIUM CHLORIDE 0.9 % IJ SOLN
10.0000 mL | Freq: Once | INTRAMUSCULAR | Status: AC
  Administered 2018-02-23: 10 mL
  Filled 2018-02-23: qty 10

## 2018-02-23 MED ORDER — ACETAMINOPHEN 325 MG PO TABS
ORAL_TABLET | ORAL | Status: AC
  Filled 2018-02-23: qty 2

## 2018-02-23 MED ORDER — FAMOTIDINE IN NACL 20-0.9 MG/50ML-% IV SOLN
20.0000 mg | Freq: Once | INTRAVENOUS | Status: AC
  Administered 2018-02-23: 20 mg via INTRAVENOUS

## 2018-02-23 MED ORDER — PROCHLORPERAZINE MALEATE 10 MG PO TABS
ORAL_TABLET | ORAL | Status: AC
  Filled 2018-02-23: qty 1

## 2018-02-23 MED ORDER — PROCHLORPERAZINE MALEATE 10 MG PO TABS
10.0000 mg | ORAL_TABLET | Freq: Once | ORAL | Status: AC
  Administered 2018-02-23: 10 mg via ORAL

## 2018-02-23 MED ORDER — SODIUM CHLORIDE 0.9 % IV SOLN
Freq: Once | INTRAVENOUS | Status: AC
  Administered 2018-02-23: 12:00:00 via INTRAVENOUS
  Filled 2018-02-23: qty 250

## 2018-02-23 MED ORDER — DIPHENHYDRAMINE HCL 25 MG PO CAPS
ORAL_CAPSULE | ORAL | Status: AC
  Filled 2018-02-23: qty 2

## 2018-02-23 MED ORDER — ACETAMINOPHEN 325 MG PO TABS
650.0000 mg | ORAL_TABLET | Freq: Once | ORAL | Status: AC
  Administered 2018-02-23: 650 mg via ORAL

## 2018-02-23 MED ORDER — SODIUM CHLORIDE 0.9 % IV SOLN
900.0000 mg | Freq: Once | INTRAVENOUS | Status: AC
  Administered 2018-02-23: 900 mg via INTRAVENOUS
  Filled 2018-02-23: qty 36

## 2018-02-23 NOTE — Patient Instructions (Signed)
Implanted Port Home Guide An implanted port is a type of central line that is placed under the skin. Central lines are used to provide IV access when treatment or nutrition needs to be given through a person's veins. Implanted ports are used for long-term IV access. An implanted port may be placed because:  You need IV medicine that would be irritating to the small veins in your hands or arms.  You need long-term IV medicines, such as antibiotics.  You need IV nutrition for a long period.  You need frequent blood draws for lab tests.  You need dialysis.  Implanted ports are usually placed in the chest area, but they can also be placed in the upper arm, the abdomen, or the leg. An implanted port has two main parts:  Reservoir. The reservoir is round and will appear as a small, raised area under your skin. The reservoir is the part where a needle is inserted to give medicines or draw blood.  Catheter. The catheter is a thin, flexible tube that extends from the reservoir. The catheter is placed into a large vein. Medicine that is inserted into the reservoir goes into the catheter and then into the vein.  How will I care for my incision site? Do not get the incision site wet. Bathe or shower as directed by your health care provider. How is my port accessed? Special steps must be taken to access the port:  Before the port is accessed, a numbing cream can be placed on the skin. This helps numb the skin over the port site.  Your health care provider uses a sterile technique to access the port. ? Your health care provider must put on a mask and sterile gloves. ? The skin over your port is cleaned carefully with an antiseptic and allowed to dry. ? The port is gently pinched between sterile gloves, and a needle is inserted into the port.  Only "non-coring" port needles should be used to access the port. Once the port is accessed, a blood return should be checked. This helps ensure that the port  is in the vein and is not clogged.  If your port needs to remain accessed for a constant infusion, a clear (transparent) bandage will be placed over the needle site. The bandage and needle will need to be changed every week, or as directed by your health care provider.  Keep the bandage covering the needle clean and dry. Do not get it wet. Follow your health care provider's instructions on how to take a shower or bath while the port is accessed.  If your port does not need to stay accessed, no bandage is needed over the port.  What is flushing? Flushing helps keep the port from getting clogged. Follow your health care provider's instructions on how and when to flush the port. Ports are usually flushed with saline solution or a medicine called heparin. The need for flushing will depend on how the port is used.  If the port is used for intermittent medicines or blood draws, the port will need to be flushed: ? After medicines have been given. ? After blood has been drawn. ? As part of routine maintenance.  If a constant infusion is running, the port may not need to be flushed.  How long will my port stay implanted? The port can stay in for as long as your health care provider thinks it is needed. When it is time for the port to come out, surgery will be   done to remove it. The procedure is similar to the one performed when the port was put in. When should I seek immediate medical care? When you have an implanted port, you should seek immediate medical care if:  You notice a bad smell coming from the incision site.  You have swelling, redness, or drainage at the incision site.  You have more swelling or pain at the port site or the surrounding area.  You have a fever that is not controlled with medicine.  This information is not intended to replace advice given to you by your health care provider. Make sure you discuss any questions you have with your health care provider. Document  Released: 05/13/2005 Document Revised: 10/19/2015 Document Reviewed: 01/18/2013 Elsevier Interactive Patient Education  2017 [employer] Inc[employer]  

## 2018-02-23 NOTE — Progress Notes (Signed)
Ok to treat with platelets 98 per Dr.Ennever  

## 2018-02-23 NOTE — Patient Instructions (Signed)
[facility] Discharge Instructions for Patients Receiving Chemotherapy  Today you received the following chemotherapy agents: Empliciti  To help prevent nausea and vomiting after your treatment, we encourage you to take your nausea medication  as prescribed.    If you develop nausea and vomiting that is not controlled by your nausea medication, call the clinic.   BELOW ARE SYMPTOMS THAT SHOULD BE REPORTED IMMEDIATELY:  *FEVER GREATER THAN 100.5 F  *CHILLS WITH OR WITHOUT FEVER  NAUSEA AND VOMITING THAT IS NOT CONTROLLED WITH YOUR NAUSEA MEDICATION  *UNUSUAL SHORTNESS OF BREATH  *UNUSUAL BRUISING OR BLEEDING  TENDERNESS IN MOUTH AND THROAT WITH OR WITHOUT PRESENCE OF ULCERS  *URINARY PROBLEMS  *BOWEL PROBLEMS  UNUSUAL RASH Items with * indicate a potential emergency and should be followed up as soon as possible.  Feel free to call the clinic should you have any questions or concerns. The clinic phone number is [phone].  Please show the CHEMO ALERT CARD at check-in to the Emergency Department and triage nurse.   

## 2018-03-02 ENCOUNTER — Inpatient Hospital Stay: Payer: Medicare Other

## 2018-03-02 ENCOUNTER — Other Ambulatory Visit: Payer: Self-pay | Admitting: *Deleted

## 2018-03-02 ENCOUNTER — Inpatient Hospital Stay: Payer: Medicare Other | Attending: Hematology & Oncology

## 2018-03-02 DIAGNOSIS — C9000 Multiple myeloma not having achieved remission: Secondary | ICD-10-CM

## 2018-03-02 DIAGNOSIS — Z5112 Encounter for antineoplastic immunotherapy: Secondary | ICD-10-CM | POA: Insufficient documentation

## 2018-03-02 DIAGNOSIS — C9002 Multiple myeloma in relapse: Secondary | ICD-10-CM | POA: Diagnosis not present

## 2018-03-02 LAB — CBC WITH DIFFERENTIAL (CANCER CENTER ONLY)
Basophils Absolute: 0.1 10*3/uL (ref 0.0–0.1)
Basophils Relative: 2 %
EOS PCT: 10 %
Eosinophils Absolute: 0.4 10*3/uL (ref 0.0–0.5)
HEMATOCRIT: 43 % (ref 38.7–49.9)
HEMOGLOBIN: 14.4 g/dL (ref 13.0–17.1)
LYMPHS ABS: 1.3 10*3/uL (ref 0.9–3.3)
LYMPHS PCT: 33 %
MCH: 34 pg — AB (ref 28.0–33.4)
MCHC: 33.5 g/dL (ref 32.0–35.9)
MCV: 101.7 fL — AB (ref 82.0–98.0)
Monocytes Absolute: 0.9 10*3/uL (ref 0.1–0.9)
Monocytes Relative: 23 %
NEUTROS ABS: 1.2 10*3/uL — AB (ref 1.5–6.5)
Neutrophils Relative %: 32 %
PLATELETS: 112 10*3/uL — AB (ref 145–400)
RBC: 4.23 MIL/uL (ref 4.20–5.70)
RDW: 13.5 % (ref 11.1–15.7)
WBC: 3.8 10*3/uL — AB (ref 4.0–10.0)

## 2018-03-02 LAB — CMP (CANCER CENTER ONLY)
ALK PHOS: 57 U/L (ref 26–84)
ALT: 32 U/L (ref 10–47)
ANION GAP: 0 — AB (ref 5–15)
AST: 23 U/L (ref 11–38)
Albumin: 3.1 g/dL — ABNORMAL LOW (ref 3.5–5.0)
BILIRUBIN TOTAL: 1.1 mg/dL (ref 0.2–1.6)
BUN: 13 mg/dL (ref 7–22)
CALCIUM: 8.7 mg/dL (ref 8.0–10.3)
CO2: 29 mmol/L (ref 18–33)
Chloride: 110 mmol/L — ABNORMAL HIGH (ref 98–108)
Creatinine: 0.9 mg/dL (ref 0.60–1.20)
Glucose, Bld: 174 mg/dL — ABNORMAL HIGH (ref 73–118)
POTASSIUM: 4.5 mmol/L (ref 3.3–4.7)
Sodium: 133 mmol/L (ref 128–145)
TOTAL PROTEIN: 5.4 g/dL — AB (ref 6.4–8.1)

## 2018-03-02 MED ORDER — DIPHENHYDRAMINE HCL 25 MG PO CAPS
50.0000 mg | ORAL_CAPSULE | Freq: Once | ORAL | Status: AC
  Administered 2018-03-02: 50 mg via ORAL

## 2018-03-02 MED ORDER — ACETAMINOPHEN 325 MG PO TABS
ORAL_TABLET | ORAL | Status: AC
  Filled 2018-03-02: qty 2

## 2018-03-02 MED ORDER — DEXAMETHASONE SODIUM PHOSPHATE 10 MG/ML IJ SOLN
8.0000 mg | Freq: Once | INTRAMUSCULAR | Status: AC
  Administered 2018-03-02: 8 mg via INTRAVENOUS

## 2018-03-02 MED ORDER — HEPARIN SOD (PORK) LOCK FLUSH 100 UNIT/ML IV SOLN
500.0000 [IU] | Freq: Once | INTRAVENOUS | Status: AC | PRN
  Administered 2018-03-02: 500 [IU]
  Filled 2018-03-02: qty 5

## 2018-03-02 MED ORDER — SODIUM CHLORIDE 0.9 % IV SOLN
Freq: Once | INTRAVENOUS | Status: AC
  Administered 2018-03-02: 11:00:00 via INTRAVENOUS
  Filled 2018-03-02: qty 250

## 2018-03-02 MED ORDER — ACETAMINOPHEN 325 MG PO TABS
650.0000 mg | ORAL_TABLET | Freq: Once | ORAL | Status: AC
  Administered 2018-03-02: 650 mg via ORAL

## 2018-03-02 MED ORDER — PROCHLORPERAZINE MALEATE 10 MG PO TABS
ORAL_TABLET | ORAL | Status: AC
  Filled 2018-03-02: qty 1

## 2018-03-02 MED ORDER — SODIUM CHLORIDE 0.9 % IV SOLN
10.5000 mg/kg | Freq: Once | INTRAVENOUS | Status: AC
  Administered 2018-03-02: 900 mg via INTRAVENOUS
  Filled 2018-03-02: qty 36

## 2018-03-02 MED ORDER — FAMOTIDINE IN NACL 20-0.9 MG/50ML-% IV SOLN
20.0000 mg | Freq: Once | INTRAVENOUS | Status: AC
  Administered 2018-03-02: 20 mg via INTRAVENOUS

## 2018-03-02 MED ORDER — SODIUM CHLORIDE 0.9% FLUSH
10.0000 mL | INTRAVENOUS | Status: DC | PRN
  Administered 2018-03-02: 10 mL
  Filled 2018-03-02: qty 10

## 2018-03-02 MED ORDER — FAMOTIDINE IN NACL 20-0.9 MG/50ML-% IV SOLN
INTRAVENOUS | Status: AC
  Filled 2018-03-02: qty 50

## 2018-03-02 MED ORDER — DEXAMETHASONE SODIUM PHOSPHATE 10 MG/ML IJ SOLN
INTRAMUSCULAR | Status: AC
  Filled 2018-03-02: qty 1

## 2018-03-02 MED ORDER — DIPHENHYDRAMINE HCL 25 MG PO CAPS
ORAL_CAPSULE | ORAL | Status: AC
  Filled 2018-03-02: qty 2

## 2018-03-02 MED ORDER — PROCHLORPERAZINE MALEATE 10 MG PO TABS
10.0000 mg | ORAL_TABLET | Freq: Once | ORAL | Status: AC
  Administered 2018-03-02: 10 mg via ORAL

## 2018-03-02 NOTE — Patient Instructions (Signed)
Implanted Port Home Guide An implanted port is a type of central line that is placed under the skin. Central lines are used to provide IV access when treatment or nutrition needs to be given through a person's veins. Implanted ports are used for long-term IV access. An implanted port may be placed because:  You need IV medicine that would be irritating to the small veins in your hands or arms.  You need long-term IV medicines, such as antibiotics.  You need IV nutrition for a long period.  You need frequent blood draws for lab tests.  You need dialysis.  Implanted ports are usually placed in the chest area, but they can also be placed in the upper arm, the abdomen, or the leg. An implanted port has two main parts:  Reservoir. The reservoir is round and will appear as a small, raised area under your skin. The reservoir is the part where a needle is inserted to give medicines or draw blood.  Catheter. The catheter is a thin, flexible tube that extends from the reservoir. The catheter is placed into a large vein. Medicine that is inserted into the reservoir goes into the catheter and then into the vein.  How will I care for my incision site? Do not get the incision site wet. Bathe or shower as directed by your health care provider. How is my port accessed? Special steps must be taken to access the port:  Before the port is accessed, a numbing cream can be placed on the skin. This helps numb the skin over the port site.  Your health care provider uses a sterile technique to access the port. ? Your health care provider must put on a mask and sterile gloves. ? The skin over your port is cleaned carefully with an antiseptic and allowed to dry. ? The port is gently pinched between sterile gloves, and a needle is inserted into the port.  Only "non-coring" port needles should be used to access the port. Once the port is accessed, a blood return should be checked. This helps ensure that the port  is in the vein and is not clogged.  If your port needs to remain accessed for a constant infusion, a clear (transparent) bandage will be placed over the needle site. The bandage and needle will need to be changed every week, or as directed by your health care provider.  Keep the bandage covering the needle clean and dry. Do not get it wet. Follow your health care provider's instructions on how to take a shower or bath while the port is accessed.  If your port does not need to stay accessed, no bandage is needed over the port.  What is flushing? Flushing helps keep the port from getting clogged. Follow your health care provider's instructions on how and when to flush the port. Ports are usually flushed with saline solution or a medicine called heparin. The need for flushing will depend on how the port is used.  If the port is used for intermittent medicines or blood draws, the port will need to be flushed: ? After medicines have been given. ? After blood has been drawn. ? As part of routine maintenance.  If a constant infusion is running, the port may not need to be flushed.  How long will my port stay implanted? The port can stay in for as long as your health care provider thinks it is needed. When it is time for the port to come out, surgery will be   done to remove it. The procedure is similar to the one performed when the port was put in. When should I seek immediate medical care? When you have an implanted port, you should seek immediate medical care if:  You notice a bad smell coming from the incision site.  You have swelling, redness, or drainage at the incision site.  You have more swelling or pain at the port site or the surrounding area.  You have a fever that is not controlled with medicine.  This information is not intended to replace advice given to you by your health care provider. Make sure you discuss any questions you have with your health care provider. Document  Released: 05/13/2005 Document Revised: 10/19/2015 Document Reviewed: 01/18/2013 Elsevier Interactive Patient Education  2017 [employer] Inc[employer]  

## 2018-03-02 NOTE — Progress Notes (Signed)
Ok to treat with low ANC per Dr Ennever. dph 

## 2018-03-02 NOTE — Patient Instructions (Signed)
Elotuzumab injection What is this medicine? ELOTUZUMAB (el oh tooz ue mab) is a monoclonal antibody. It is used to treat multiple myeloma. This medicine may be used for other purposes; ask your health care provider or pharmacist if you have questions. COMMON BRAND NAME(S): Empliciti What should I tell my health care provider before I take this medicine? They need to know if you have any of these conditions: -hepatic disease -infection -an unusual or allergic reaction to elotuzumab, other medicines, foods, dyes, or preservatives -pregnant or trying to get pregnant -breast-feeding How should I use this medicine? This medicine is for infusion into a vein. It is given by a health care professional in a hospital or clinic setting. Talk to your pediatrician regarding the use of this medicine in children. Special care may be needed. Overdosage: If you think you have taken too much of this medicine contact a poison control center or emergency room at once. NOTE: This medicine is only for you. Do not share this medicine with others. What if I miss a dose? Keep appointments for follow-up doses as directed. It is important not to miss your dose. Call your doctor or health care professional if you are unable to keep an appointment. What may interact with this medicine? Interactions have not been studied. Give your health care provider a list of all the medicines, herbs, non-prescription drugs, or dietary supplements you use. Also tell them if you smoke, drink alcohol, or use illegal drugs. Some items may interact with your medicine. This list may not describe all possible interactions. Give your health care provider a list of all the medicines, herbs, non-prescription drugs, or dietary supplements you use. Also tell them if you smoke, drink alcohol, or use illegal drugs. Some items may interact with your medicine. What should I watch for while using this medicine? This drug may make you feel generally  unwell. Report any side effects. Continue your course of treatment even though you feel ill unless your doctor tells you to stop. This medicine can cause serious allergic reactions. To reduce your risk you may need to take medicine before treatment with this medicine. Take your medicine as directed. You may need blood work done while you are taking this medicine. This medicine can affect the results of some tests used to determine treatment response; extra tests may be needed to evaluate response. Talk to your doctor about your risk of cancer. You may be more at risk for certain types of cancers if you take this medicine. Women should inform their doctor if they wish to become pregnant or think they might be pregnant. There is a potential for serious side effects to an unborn child. Talk to your health care professional or pharmacist for more information. Do not breast-feed an infant while taking this medicine. What side effects may I notice from receiving this medicine? Side effects that you should report to your doctor or health care professional as soon as possible: -allergic reactions like skin rash, itching or hives, swelling of the face, lips, or tongue -breathing problems -dizziness -lightheaded -signs and symptoms of infection like fever or chills; cough; sore throat; pain or trouble passing urine -signs and symptoms of liver injury like dark yellow or brown urine; general ill feeling or flu-like symptoms; light-colored stools; loss of appetite; nausea; right upper belly pain; unusually weak or tired; yellowing of the eyes or skin Side effects that usually do not require medical attention (report to your doctor or health care professional if they continue   or are bothersome): -constipation -decreased appetite -diarrhea -pain, tingling, numbness in the hands or feet -tiredness This list may not describe all possible side effects. Call your doctor for medical advice about side effects. You  may report side effects to FDA at 1-800-FDA-1088. Where should I keep my medicine? Keep out of the reach of children. This drug is given in a hospital or clinic and will not be stored at home. NOTE: This sheet is a summary. It may not cover all possible information. If you have questions about this medicine, talk to your doctor, pharmacist, or health care provider.  2018 Elsevier/Gold Standard (2015-06-15 10:43:53)  

## 2018-03-03 ENCOUNTER — Other Ambulatory Visit: Payer: Self-pay | Admitting: *Deleted

## 2018-03-03 DIAGNOSIS — C9000 Multiple myeloma not having achieved remission: Secondary | ICD-10-CM

## 2018-03-03 DIAGNOSIS — S91332A Puncture wound without foreign body, left foot, initial encounter: Secondary | ICD-10-CM

## 2018-03-03 MED ORDER — POMALIDOMIDE 4 MG PO CAPS: 4.0000 mg | ORAL_CAPSULE | Freq: Every day | ORAL | 0 refills | Status: DC

## 2018-03-09 ENCOUNTER — Ambulatory Visit: Payer: Medicare Other

## 2018-03-09 ENCOUNTER — Other Ambulatory Visit: Payer: Medicare Other

## 2018-03-09 ENCOUNTER — Ambulatory Visit: Payer: Medicare Other | Admitting: Hematology & Oncology

## 2018-03-16 DIAGNOSIS — Z23 Encounter for immunization: Secondary | ICD-10-CM | POA: Diagnosis not present

## 2018-03-20 ENCOUNTER — Other Ambulatory Visit: Payer: Self-pay | Admitting: *Deleted

## 2018-03-20 DIAGNOSIS — S91332A Puncture wound without foreign body, left foot, initial encounter: Secondary | ICD-10-CM

## 2018-03-20 DIAGNOSIS — C9000 Multiple myeloma not having achieved remission: Secondary | ICD-10-CM

## 2018-03-20 MED ORDER — POMALIDOMIDE 4 MG PO CAPS: 4.0000 mg | ORAL_CAPSULE | Freq: Every day | ORAL | 0 refills | Status: DC

## 2018-03-23 ENCOUNTER — Other Ambulatory Visit: Payer: Self-pay

## 2018-03-23 ENCOUNTER — Inpatient Hospital Stay (HOSPITAL_BASED_OUTPATIENT_CLINIC_OR_DEPARTMENT_OTHER): Payer: Medicare Other | Admitting: Hematology & Oncology

## 2018-03-23 ENCOUNTER — Inpatient Hospital Stay: Payer: Medicare Other

## 2018-03-23 VITALS — BP 109/53 | HR 69 | Temp 98.3°F | Resp 18 | Wt 201.0 lb

## 2018-03-23 DIAGNOSIS — C9002 Multiple myeloma in relapse: Secondary | ICD-10-CM | POA: Diagnosis not present

## 2018-03-23 DIAGNOSIS — C9000 Multiple myeloma not having achieved remission: Secondary | ICD-10-CM

## 2018-03-23 DIAGNOSIS — Z5112 Encounter for antineoplastic immunotherapy: Secondary | ICD-10-CM | POA: Diagnosis not present

## 2018-03-23 LAB — CBC WITH DIFFERENTIAL (CANCER CENTER ONLY)
ABS IMMATURE GRANULOCYTES: 0.02 10*3/uL (ref 0.00–0.07)
Basophils Absolute: 0.1 10*3/uL (ref 0.0–0.1)
Basophils Relative: 1 %
EOS ABS: 0.7 10*3/uL — AB (ref 0.0–0.5)
Eosinophils Relative: 13 %
HCT: 42.6 % (ref 39.0–52.0)
Hemoglobin: 14 g/dL (ref 13.0–17.0)
IMMATURE GRANULOCYTES: 0 %
LYMPHS ABS: 1.4 10*3/uL (ref 0.7–4.0)
Lymphocytes Relative: 28 %
MCH: 32.9 pg (ref 26.0–34.0)
MCHC: 32.9 g/dL (ref 30.0–36.0)
MCV: 100.2 fL — AB (ref 80.0–100.0)
MONO ABS: 0.8 10*3/uL (ref 0.1–1.0)
Monocytes Relative: 16 %
NEUTROS ABS: 2.2 10*3/uL (ref 1.7–7.7)
NEUTROS PCT: 42 %
Platelet Count: 175 10*3/uL (ref 150–400)
RBC: 4.25 MIL/uL (ref 4.22–5.81)
RDW: 13.7 % (ref 11.5–15.5)
WBC: 5.1 10*3/uL (ref 4.0–10.5)
nRBC: 0 % (ref 0.0–0.2)

## 2018-03-23 LAB — CMP (CANCER CENTER ONLY)
ALBUMIN: 2.9 g/dL — AB (ref 3.5–5.0)
ALT: 16 U/L (ref 10–47)
AST: 17 U/L (ref 11–38)
Alkaline Phosphatase: 56 U/L (ref 26–84)
Anion gap: 0 — ABNORMAL LOW (ref 5–15)
BILIRUBIN TOTAL: 0.9 mg/dL (ref 0.2–1.6)
BUN: 16 mg/dL (ref 7–22)
CO2: 27 mmol/L (ref 18–33)
CREATININE: 1.3 mg/dL — AB (ref 0.60–1.20)
Calcium: 8.5 mg/dL (ref 8.0–10.3)
Chloride: 107 mmol/L (ref 98–108)
GLUCOSE: 198 mg/dL — AB (ref 73–118)
Potassium: 4.1 mmol/L (ref 3.3–4.7)
SODIUM: 133 mmol/L (ref 128–145)
Total Protein: 5.3 g/dL — ABNORMAL LOW (ref 6.4–8.1)

## 2018-03-23 MED ORDER — HEPARIN SOD (PORK) LOCK FLUSH 100 UNIT/ML IV SOLN
500.0000 [IU] | Freq: Once | INTRAVENOUS | Status: AC | PRN
  Administered 2018-03-23: 500 [IU]
  Filled 2018-03-23: qty 5

## 2018-03-23 MED ORDER — DEXAMETHASONE SODIUM PHOSPHATE 10 MG/ML IJ SOLN
8.0000 mg | Freq: Once | INTRAMUSCULAR | Status: AC
  Administered 2018-03-23: 8 mg via INTRAVENOUS

## 2018-03-23 MED ORDER — FAMOTIDINE IN NACL 20-0.9 MG/50ML-% IV SOLN
INTRAVENOUS | Status: AC
  Filled 2018-03-23: qty 50

## 2018-03-23 MED ORDER — DIPHENHYDRAMINE HCL 25 MG PO CAPS
50.0000 mg | ORAL_CAPSULE | Freq: Once | ORAL | Status: AC
  Administered 2018-03-23: 50 mg via ORAL

## 2018-03-23 MED ORDER — SODIUM CHLORIDE 0.9% FLUSH
10.0000 mL | INTRAVENOUS | Status: DC | PRN
  Administered 2018-03-23: 10 mL
  Filled 2018-03-23: qty 10

## 2018-03-23 MED ORDER — SODIUM CHLORIDE 0.9 % IV SOLN
Freq: Once | INTRAVENOUS | Status: AC
  Administered 2018-03-23: 14:00:00 via INTRAVENOUS
  Filled 2018-03-23: qty 250

## 2018-03-23 MED ORDER — DEXAMETHASONE SODIUM PHOSPHATE 10 MG/ML IJ SOLN
INTRAMUSCULAR | Status: AC
  Filled 2018-03-23: qty 1

## 2018-03-23 MED ORDER — DIPHENHYDRAMINE HCL 25 MG PO CAPS
ORAL_CAPSULE | ORAL | Status: AC
  Filled 2018-03-23: qty 2

## 2018-03-23 MED ORDER — PROCHLORPERAZINE MALEATE 10 MG PO TABS
10.0000 mg | ORAL_TABLET | Freq: Once | ORAL | Status: AC
  Administered 2018-03-23: 10 mg via ORAL

## 2018-03-23 MED ORDER — PROCHLORPERAZINE MALEATE 10 MG PO TABS
ORAL_TABLET | ORAL | Status: AC
  Filled 2018-03-23: qty 1

## 2018-03-23 MED ORDER — FAMOTIDINE IN NACL 20-0.9 MG/50ML-% IV SOLN
20.0000 mg | Freq: Once | INTRAVENOUS | Status: AC
  Administered 2018-03-23: 20 mg via INTRAVENOUS

## 2018-03-23 MED ORDER — SODIUM CHLORIDE 0.9 % IV SOLN
10.5000 mg/kg | Freq: Once | INTRAVENOUS | Status: AC
  Administered 2018-03-23: 900 mg via INTRAVENOUS
  Filled 2018-03-23: qty 36

## 2018-03-23 MED ORDER — ACETAMINOPHEN 325 MG PO TABS
ORAL_TABLET | ORAL | Status: AC
  Filled 2018-03-23: qty 2

## 2018-03-23 MED ORDER — ACETAMINOPHEN 325 MG PO TABS
650.0000 mg | ORAL_TABLET | Freq: Once | ORAL | Status: AC
  Administered 2018-03-23: 650 mg via ORAL

## 2018-03-23 NOTE — Patient Instructions (Signed)
Elotuzumab injection What is this medicine? ELOTUZUMAB (el oh tooz ue mab) is a monoclonal antibody. It is used to treat multiple myeloma. This medicine may be used for other purposes; ask your health care provider or pharmacist if you have questions. COMMON BRAND NAME(S): Empliciti What should I tell my health care provider before I take this medicine? They need to know if you have any of these conditions: -hepatic disease -infection -an unusual or allergic reaction to elotuzumab, other medicines, foods, dyes, or preservatives -pregnant or trying to get pregnant -breast-feeding How should I use this medicine? This medicine is for infusion into a vein. It is given by a health care professional in a hospital or clinic setting. Talk to your pediatrician regarding the use of this medicine in children. Special care may be needed. Overdosage: If you think you have taken too much of this medicine contact a poison control center or emergency room at once. NOTE: This medicine is only for you. Do not share this medicine with others. What if I miss a dose? Keep appointments for follow-up doses as directed. It is important not to miss your dose. Call your doctor or health care professional if you are unable to keep an appointment. What may interact with this medicine? Interactions have not been studied. Give your health care provider a list of all the medicines, herbs, non-prescription drugs, or dietary supplements you use. Also tell them if you smoke, drink alcohol, or use illegal drugs. Some items may interact with your medicine. This list may not describe all possible interactions. Give your health care provider a list of all the medicines, herbs, non-prescription drugs, or dietary supplements you use. Also tell them if you smoke, drink alcohol, or use illegal drugs. Some items may interact with your medicine. What should I watch for while using this medicine? This drug may make you feel generally  unwell. Report any side effects. Continue your course of treatment even though you feel ill unless your doctor tells you to stop. This medicine can cause serious allergic reactions. To reduce your risk you may need to take medicine before treatment with this medicine. Take your medicine as directed. You may need blood work done while you are taking this medicine. This medicine can affect the results of some tests used to determine treatment response; extra tests may be needed to evaluate response. Talk to your doctor about your risk of cancer. You may be more at risk for certain types of cancers if you take this medicine. Women should inform their doctor if they wish to become pregnant or think they might be pregnant. There is a potential for serious side effects to an unborn child. Talk to your health care professional or pharmacist for more information. Do not breast-feed an infant while taking this medicine. What side effects may I notice from receiving this medicine? Side effects that you should report to your doctor or health care professional as soon as possible: -allergic reactions like skin rash, itching or hives, swelling of the face, lips, or tongue -breathing problems -dizziness -lightheaded -signs and symptoms of infection like fever or chills; cough; sore throat; pain or trouble passing urine -signs and symptoms of liver injury like dark yellow or brown urine; general ill feeling or flu-like symptoms; light-colored stools; loss of appetite; nausea; right upper belly pain; unusually weak or tired; yellowing of the eyes or skin Side effects that usually do not require medical attention (report to your doctor or health care professional if they continue   or are bothersome): -constipation -decreased appetite -diarrhea -pain, tingling, numbness in the hands or feet -tiredness This list may not describe all possible side effects. Call your doctor for medical advice about side effects. You  may report side effects to FDA at 1-800-FDA-1088. Where should I keep my medicine? Keep out of the reach of children. This drug is given in a hospital or clinic and will not be stored at home. NOTE: This sheet is a summary. It may not cover all possible information. If you have questions about this medicine, talk to your doctor, pharmacist, or health care provider.  2018 Elsevier/Gold Standard (2015-06-15 10:43:53)  

## 2018-03-23 NOTE — Patient Instructions (Signed)
Implanted Port Insertion, Care After °This sheet gives you information about how to care for yourself after your procedure. Your health care provider may also give you more specific instructions. If you have problems or questions, contact your health care provider. °What can I expect after the procedure? °After your procedure, it is common to have: °· Discomfort at the port insertion site. °· Bruising on the skin over the port. This should improve over 3-4 days. ° °Follow these instructions at home: °Port care °· After your port is placed, you will get a manufacturer's information card. The card has information about your port. Keep this card with you at all times. °· Take care of the port as told by your health care provider. Ask your health care provider if you or a family member can get training for taking care of the port at home. A home health care nurse may also take care of the port. °· Make sure to remember what type of port you have. °Incision care °· Follow instructions from your health care provider about how to take care of your port insertion site. Make sure you: °? Wash your hands with soap and water before you change your bandage (dressing). If soap and water are not available, use hand sanitizer. °? Change your dressing as told by your health care provider. °? Leave stitches (sutures), skin glue, or adhesive strips in place. These skin closures may need to stay in place for 2 weeks or longer. If adhesive strip edges start to loosen and curl up, you may trim the loose edges. Do not remove adhesive strips completely unless your health care provider tells you to do that. °· Check your port insertion site every day for signs of infection. Check for: °? More redness, swelling, or pain. °? More fluid or blood. °? Warmth. °? Pus or a bad smell. °General instructions °· Do not take baths, swim, or use a hot tub until your health care provider approves. °· Do not lift anything that is heavier than 10 lb (4.5  kg) for a week, or as told by your health care provider. °· Ask your health care provider when it is okay to: °? Return to work or school. °? Resume usual physical activities or sports. °· Do not drive for 24 hours if you were given a medicine to help you relax (sedative). °· Take over-the-counter and prescription medicines only as told by your health care provider. °· Wear a medical alert bracelet in case of an emergency. This will tell any health care providers that you have a port. °· Keep all follow-up visits as told by your health care provider. This is important. °Contact a health care provider if: °· You cannot flush your port with saline as directed, or you cannot draw blood from the port. °· You have a fever or chills. °· You have more redness, swelling, or pain around your port insertion site. °· You have more fluid or blood coming from your port insertion site. °· Your port insertion site feels warm to the touch. °· You have pus or a bad smell coming from the port insertion site. °Get help right away if: °· You have chest pain or shortness of breath. °· You have bleeding from your port that you cannot control. °Summary °· Take care of the port as told by your health care provider. °· Change your dressing as told by your health care provider. °· Keep all follow-up visits as told by your health care provider. °  This information is not intended to replace advice given to you by your health care provider. Make sure you discuss any questions you have with your health care provider. °Document Released: 03/03/2013 Document Revised: 04/03/2016 Document Reviewed: 04/03/2016 °Elsevier Interactive Patient Education © 2017 [employer] ° °

## 2018-03-23 NOTE — Progress Notes (Signed)
Hematology and Oncology Follow Up Visit  ZAID TOMES [id number] [birth year] 77 y.o. 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 Elotuzumab/Pomalidomide/decadron -every 4 weeks- start 04/20/2018    Interim History:  Mr. Weisel is here today with his wife for follow-up.  [place].  He is doing quite well.  He really has had no issues with treatment.  His last kappa light chain was stable at 6.2 mg/dL.  His blood sugars are doing okay.  He watches these very closely.  He has had no problems with fever.  He has had no cough or shortness of breath.  He has had no nausea or vomiting.  There is been no change in bowel or bladder habits.  He has had no leg swelling.  He has had no rashes.  Overall, his performance status is ECOG 1.    Medications:  Allergies as of 03/23/2018   No Known Allergies     Medication List        Accurate as of 03/23/18  6:02 PM. Always use your most recent med list.          acyclovir 400 MG tablet Commonly known as:  ZOVIRAX TAKE 1 TABLET(400 MG) BY MOUTH TWICE DAILY. 90 day supply   amLODipine 2.5 MG tablet Commonly known as:  NORVASC Take 2.5 mg by mouth daily.   aspirin 81 MG tablet Take 81 mg by mouth daily.   atorvastatin 10 MG tablet Commonly known as:  LIPITOR Take 10 mg by mouth daily.   calcium-vitamin D 250-125 MG-UNIT tablet Commonly known as:  OSCAL Take 1 tablet by mouth daily.   cycloSPORINE 0.05 % ophthalmic emulsion Commonly known as:  RESTASIS Place 1 drop into both eyes 2 (two) times daily.   fish oil-omega-3 fatty acids 1000 MG capsule Take 1 g by mouth 2 (two) times daily.   INSULIN LISP & LISP PROT  (HUM) [place]   insulin lispro 100 UNIT/ML injection Commonly known as:  HUMALOG Inject into the skin. Insulin pump   irbesartan-hydrochlorothiazide 300-12.5 MG tablet Commonly known as:  AVALIDE Take 1 tablet by mouth daily.   lidocaine-prilocaine cream Commonly known as:  EMLA Apply 1 application topically as needed.   montelukast 10 MG tablet Commonly known as:  SINGULAIR Take 10 mg by mouth at bedtime.   ondansetron 4 MG disintegrating tablet Commonly known as:  ZOFRAN-ODT Take 4 mg by mouth 4 (four) times daily as needed for nausea or vomiting.   pomalidomide 4 MG capsule Commonly known as:  POMALYST Take 1 capsule (4 mg total) by mouth daily. Take with water on days 1-21. Repeat every 28 days. [id number]   Vitamin D 2000 units Caps Take by mouth every morning.   XIFAXAN 200 MG tablet Generic drug:  rifaximin Take 200 mg by mouth 3 (three) times daily as needed.       Allergies: No Known Allergies  Past Medical History, Surgical history, Social history, and Family History were reviewed and updated.  Review of Systems: Review of Systems  Constitutional: Negative.   HENT: Negative.   Eyes: Negative.   Respiratory: Negative.   Cardiovascular: Negative.   Gastrointestinal: Negative.  Genitourinary: Negative.   Musculoskeletal: Negative.   Skin: Negative.   Neurological: Negative.   Endo/Heme/Allergies: Negative.   Psychiatric/Behavioral: Negative.      Physical Exam:  weight is 201 lb (91.2 kg). His oral temperature is 98.3 F (36.8 C). His blood pressure is 109/53 (abnormal) and his pulse is 69. His respiration is 18 and oxygen saturation is 99%.   Wt Readings from Last 3 Encounters:  03/23/18 201 lb (91.2 kg)  02/16/18 203 lb (92.1 kg)  02/02/18 197 lb (89.4 kg)    Physical Exam  Constitutional: He is oriented to person, place, and time.  HENT:  Head: Normocephalic and atraumatic.  Mouth/Throat: Oropharynx is clear and moist.  Eyes: Pupils are  equal, round, and reactive to light. EOM are normal.  Neck: Normal range of motion.  Cardiovascular: Normal rate, regular rhythm and normal heart sounds.  Pulmonary/Chest: Effort normal and breath sounds normal.  Abdominal: Soft. Bowel sounds are normal.  Musculoskeletal: Normal range of motion. He exhibits no edema, tenderness or deformity.  Lymphadenopathy:    He has no cervical adenopathy.  Neurological: He is alert and oriented to person, place, and time.  Skin: Skin is warm and dry. No rash noted. No erythema.  Psychiatric: He has a normal mood and affect. His behavior is normal. Judgment and thought content normal.  Vitals reviewed.    Lab Results  Component Value Date   WBC 5.1 03/23/2018   HGB 14.0 03/23/2018   HCT 42.6 03/23/2018   MCV 100.2 (H) 03/23/2018   PLT 175 03/23/2018   No results found for: FERRITIN, IRON, TIBC, UIBC, IRONPCTSAT Lab Results  Component Value Date   RBC 4.25 03/23/2018   Lab Results  Component Value Date   KPAFRELGTCHN 61.7 (H) 02/16/2018   LAMBDASER 3.5 (L) 02/16/2018   KAPLAMBRATIO 17.63 (H) 02/16/2018   Lab Results  Component Value Date   IGGSERUM 424 (L) 02/16/2018   IGGSERUM 429 (L) 02/16/2018   IGA 39 (L) 02/16/2018   IGA 38 (L) 02/16/2018   IGMSERUM 5 (L) 02/16/2018   IGMSERUM <5 (L) 02/16/2018   Lab Results  Component Value Date   TOTALPROTELP 5.1 (L) 02/16/2018   ALBUMINELP 3.2 02/16/2018   A1GS 0.2 02/16/2018   A2GS 0.6 02/16/2018   BETS 0.7 02/16/2018   BETA2SER 0.3 04/19/2015   GAMS 0.4 02/16/2018   MSPIKE 0.1 (H) 02/16/2018   SPEI Comment 12/05/2017     Chemistry      Component Value Date/Time   NA 133 03/23/2018 1150   NA 142 04/23/2017 1431   NA 139 07/18/2016 1135   K 4.1 03/23/2018 1150   K 4.0 04/23/2017 1431   K 4.4 07/18/2016 1135   CL 107 03/23/2018 1150   CL 105 04/23/2017 1431   CO2 27 03/23/2018 1150   CO2 28 04/23/2017 1431   CO2 27 07/18/2016 1135   BUN 16 03/23/2018 1150   BUN 20  04/23/2017 1431   BUN 12.2 07/18/2016 1135   CREATININE 1.30 (H) 03/23/2018 1150   CREATININE 1.2 04/23/2017 1431   CREATININE 0.9 07/18/2016 1135      Component Value Date/Time   CALCIUM 8.5 03/23/2018 1150   CALCIUM 9.3 04/23/2017 1431   CALCIUM 8.9 07/18/2016 1135   ALKPHOS 56 03/23/2018 1150   ALKPHOS 64 04/23/2017 1431   ALKPHOS 58 07/18/2016 1135   AST 17 03/23/2018 1150   AST 15 07/18/2016 1135   ALT 16 03/23/2018 1150   ALT 22 04/23/2017 1431  ALT 20 07/18/2016 1135   BILITOT 0.9 03/23/2018 1150   BILITOT 1.24 (H) 07/18/2016 1135      Impression and Plan: Mr. 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   We will have to see what his light chain levels are.  Hopefully, we will see that everything is still holding steady.  As long as the light chains are low, I do not see that this will ever cause him a problem.  I think we can probably go to every 4-week treatment now.  This is what the actual study performed.  I think this would be easier for him given that he has to travel quite a long distance for treatment.  I will see him back in 4 weeks.   [name], MD 10/28/20196:02 PM

## 2018-03-24 LAB — IGG, IGA, IGM
IGA: 48 mg/dL — AB (ref 61–437)
IGG (IMMUNOGLOBIN G), SERUM: 431 mg/dL — AB (ref 700–1600)
IGM (IMMUNOGLOBULIN M), SRM: 17 mg/dL (ref 15–143)

## 2018-03-24 LAB — KAPPA/LAMBDA LIGHT CHAINS
KAPPA, LAMDA LIGHT CHAIN RATIO: 6.7 — AB (ref 0.26–1.65)
Kappa free light chain: 80.4 mg/L — ABNORMAL HIGH (ref 3.3–19.4)
Lambda free light chains: 12 mg/L (ref 5.7–26.3)

## 2018-03-26 LAB — PROTEIN ELECTROPHORESIS, SERUM, WITH REFLEX
A/G Ratio: 1.3 (ref 0.7–1.7)
ALPHA-2-GLOBULIN: 0.8 g/dL (ref 0.4–1.0)
Albumin ELP: 2.7 g/dL — ABNORMAL LOW (ref 2.9–4.4)
Alpha-1-Globulin: 0.3 g/dL (ref 0.0–0.4)
BETA GLOBULIN: 0.6 g/dL — AB (ref 0.7–1.3)
GAMMA GLOBULIN: 0.4 g/dL (ref 0.4–1.8)
GLOBULIN, TOTAL: 2.1 g/dL — AB (ref 2.2–3.9)
M-Spike, %: 0.1 g/dL — ABNORMAL HIGH
SPEP Interpretation: 0
Total Protein ELP: 4.8 g/dL — ABNORMAL LOW (ref 6.0–8.5)

## 2018-03-26 LAB — IMMUNOFIXATION REFLEX, SERUM
IGA: 54 mg/dL — AB (ref 61–437)
IGG (IMMUNOGLOBIN G), SERUM: 445 mg/dL — AB (ref 700–1600)
IgM (Immunoglobulin M), Srm: 18 mg/dL (ref 15–143)

## 2018-04-02 DIAGNOSIS — I10 Essential (primary) hypertension: Secondary | ICD-10-CM | POA: Diagnosis not present

## 2018-04-02 DIAGNOSIS — Z6827 Body mass index (BMI) 27.0-27.9, adult: Secondary | ICD-10-CM | POA: Diagnosis not present

## 2018-04-03 ENCOUNTER — Other Ambulatory Visit: Payer: Self-pay | Admitting: *Deleted

## 2018-04-03 DIAGNOSIS — C9000 Multiple myeloma not having achieved remission: Secondary | ICD-10-CM

## 2018-04-03 DIAGNOSIS — H02831 Dermatochalasis of right upper eyelid: Secondary | ICD-10-CM | POA: Diagnosis not present

## 2018-04-03 DIAGNOSIS — H02403 Unspecified ptosis of bilateral eyelids: Secondary | ICD-10-CM | POA: Diagnosis not present

## 2018-04-03 DIAGNOSIS — H02834 Dermatochalasis of left upper eyelid: Secondary | ICD-10-CM | POA: Diagnosis not present

## 2018-04-06 ENCOUNTER — Inpatient Hospital Stay (HOSPITAL_BASED_OUTPATIENT_CLINIC_OR_DEPARTMENT_OTHER): Payer: Medicare Other | Admitting: Hematology & Oncology

## 2018-04-06 ENCOUNTER — Inpatient Hospital Stay: Payer: Medicare Other

## 2018-04-06 ENCOUNTER — Other Ambulatory Visit: Payer: Self-pay

## 2018-04-06 ENCOUNTER — Inpatient Hospital Stay: Payer: Medicare Other | Attending: Hematology & Oncology

## 2018-04-06 ENCOUNTER — Encounter: Payer: Self-pay | Admitting: Hematology & Oncology

## 2018-04-06 VITALS — BP 118/56 | HR 68 | Temp 97.7°F | Resp 16 | Wt 195.0 lb

## 2018-04-06 DIAGNOSIS — C9000 Multiple myeloma not having achieved remission: Secondary | ICD-10-CM

## 2018-04-06 DIAGNOSIS — Z5112 Encounter for antineoplastic immunotherapy: Secondary | ICD-10-CM | POA: Diagnosis not present

## 2018-04-06 DIAGNOSIS — C9002 Multiple myeloma in relapse: Secondary | ICD-10-CM | POA: Insufficient documentation

## 2018-04-06 DIAGNOSIS — H02403 Unspecified ptosis of bilateral eyelids: Secondary | ICD-10-CM | POA: Insufficient documentation

## 2018-04-06 LAB — CMP (CANCER CENTER ONLY)
ALBUMIN: 2.8 g/dL — AB (ref 3.5–5.0)
ALK PHOS: 59 U/L (ref 26–84)
ALT: 20 U/L (ref 10–47)
AST: 18 U/L (ref 11–38)
Anion gap: 5 (ref 5–15)
BILIRUBIN TOTAL: 0.9 mg/dL (ref 0.2–1.6)
BUN: 12 mg/dL (ref 7–22)
CO2: 27 mmol/L (ref 18–33)
Calcium: 8.7 mg/dL (ref 8.0–10.3)
Chloride: 105 mmol/L (ref 98–108)
Creatinine: 1 mg/dL (ref 0.60–1.20)
Glucose, Bld: 227 mg/dL — ABNORMAL HIGH (ref 73–118)
Potassium: 3.8 mmol/L (ref 3.3–4.7)
Sodium: 137 mmol/L (ref 128–145)
TOTAL PROTEIN: 5.2 g/dL — AB (ref 6.4–8.1)

## 2018-04-06 LAB — CBC WITH DIFFERENTIAL (CANCER CENTER ONLY)
Abs Immature Granulocytes: 0.01 10*3/uL (ref 0.00–0.07)
Basophils Absolute: 0.1 10*3/uL (ref 0.0–0.1)
Basophils Relative: 3 %
Eosinophils Absolute: 0.4 10*3/uL (ref 0.0–0.5)
Eosinophils Relative: 9 %
HEMATOCRIT: 44.4 % (ref 39.0–52.0)
HEMOGLOBIN: 14.1 g/dL (ref 13.0–17.0)
Immature Granulocytes: 0 %
LYMPHS ABS: 1.2 10*3/uL (ref 0.7–4.0)
Lymphocytes Relative: 30 %
MCH: 31.8 pg (ref 26.0–34.0)
MCHC: 31.8 g/dL (ref 30.0–36.0)
MCV: 100 fL (ref 80.0–100.0)
MONO ABS: 0.7 10*3/uL (ref 0.1–1.0)
Monocytes Relative: 16 %
Neutro Abs: 1.7 10*3/uL (ref 1.7–7.7)
Neutrophils Relative %: 42 %
Platelet Count: 204 10*3/uL (ref 150–400)
RBC: 4.44 MIL/uL (ref 4.22–5.81)
RDW: 13.5 % (ref 11.5–15.5)
WBC Count: 4.1 10*3/uL (ref 4.0–10.5)
nRBC: 0 % (ref 0.0–0.2)

## 2018-04-06 MED ORDER — ACETAMINOPHEN 325 MG PO TABS
ORAL_TABLET | ORAL | Status: AC
  Filled 2018-04-06: qty 2

## 2018-04-06 MED ORDER — ACETAMINOPHEN 325 MG PO TABS
650.0000 mg | ORAL_TABLET | Freq: Once | ORAL | Status: AC
  Administered 2018-04-06: 650 mg via ORAL

## 2018-04-06 MED ORDER — DEXTROSE 5 % IV SOLN
47.0000 mg/m2 | Freq: Once | INTRAVENOUS | Status: AC
  Administered 2018-04-06: 100 mg via INTRAVENOUS
  Filled 2018-04-06: qty 45

## 2018-04-06 MED ORDER — SODIUM CHLORIDE 0.9% FLUSH
10.0000 mL | INTRAVENOUS | Status: DC | PRN
  Administered 2018-04-06: 10 mL
  Filled 2018-04-06: qty 10

## 2018-04-06 MED ORDER — DIPHENHYDRAMINE HCL 25 MG PO CAPS
ORAL_CAPSULE | ORAL | Status: AC
  Filled 2018-04-06: qty 2

## 2018-04-06 MED ORDER — PROCHLORPERAZINE MALEATE 10 MG PO TABS
10.0000 mg | ORAL_TABLET | Freq: Once | ORAL | Status: AC
  Administered 2018-04-06: 10 mg via ORAL

## 2018-04-06 MED ORDER — SODIUM CHLORIDE 0.9 % IV SOLN
Freq: Once | INTRAVENOUS | Status: AC
  Administered 2018-04-06: 13:00:00 via INTRAVENOUS
  Filled 2018-04-06: qty 250

## 2018-04-06 MED ORDER — HEPARIN SOD (PORK) LOCK FLUSH 100 UNIT/ML IV SOLN
500.0000 [IU] | Freq: Once | INTRAVENOUS | Status: AC | PRN
  Administered 2018-04-06: 500 [IU]
  Filled 2018-04-06: qty 5

## 2018-04-06 MED ORDER — SODIUM CHLORIDE 0.9 % IV SOLN
20.0000 mg | Freq: Once | INTRAVENOUS | Status: AC
  Administered 2018-04-06: 20 mg via INTRAVENOUS
  Filled 2018-04-06: qty 2

## 2018-04-06 MED ORDER — PROCHLORPERAZINE MALEATE 10 MG PO TABS
ORAL_TABLET | ORAL | Status: AC
  Filled 2018-04-06: qty 1

## 2018-04-06 MED ORDER — SODIUM CHLORIDE 0.9 % IV SOLN
Freq: Once | INTRAVENOUS | Status: DC
  Filled 2018-04-06: qty 250

## 2018-04-06 MED ORDER — DIPHENHYDRAMINE HCL 25 MG PO CAPS
50.0000 mg | ORAL_CAPSULE | Freq: Once | ORAL | Status: AC
  Administered 2018-04-06: 50 mg via ORAL

## 2018-04-06 NOTE — Progress Notes (Signed)
Hematology and Oncology Follow Up Visit  Rickey [name] [id number] 12-Jun-1940 77 y.o. 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 Elotuzumab/Pomalidomide/decadron -every 4 weeks- start 01/12/2018 Kyprolis/Pomalidomide/Decadron -- start 04/06/2018    Interim History:  Rickey [name] is here today for follow-up.  Unfortunately, he is already starting to breakthrough his elotuzimab.  When we did his light chain studies, his Kappa light chain was already up to 80 mg/L.  It had been 57 mg/L.  I talked him at length.  I think that we could consider switching over to daratumumab.  However, this might be an issue as this would affect transfusions for him.  I thin option would be to consider Kyprolis again.  He will take Decadron with the Kyprolis.  We will see how this does for him.  One other option would be to add Cytoxan to the Kyprolis and possibly not The pomalidomide.  I am not sure he is had Cytoxan in the past.  He still is doing well.  He is getting ready [place] days.  His blood sugars have been doing well.  He is very diligent in managing these.  He takes insulin.  He monitors his blood sugars several times a day.  We are still focus on quality of life.  His light chains really are not up a whole lot but yet I think the trend is that it is going up.  His appetite is doing okay.  He has had no diarrhea.  He has had no leg swelling.  He has had no rashes.  Currently, his performance status is ECOG 1.  Medications:  Allergies as of 04/06/2018   No Known Allergies     Medication List        Accurate as of 04/06/18  4:10 PM. Always use your most  recent med list.          acyclovir 400 MG tablet Commonly known as:  ZOVIRAX TAKE 1 TABLET(400 MG) BY MOUTH TWICE DAILY. 90 day supply   aspirin 81 MG tablet Take 81 mg by mouth daily.   atorvastatin 10 MG tablet Commonly known as:  LIPITOR Take 10 mg by mouth daily.   calcium-vitamin D 250-125 MG-UNIT tablet Commonly known as:  OSCAL Take 1 tablet by mouth daily.   cycloSPORINE 0.05 % ophthalmic emulsion Commonly known as:  RESTASIS Place 1 drop into both eyes 2 (two) times daily.   fish oil-omega-3 fatty acids 1000 MG capsule Take 1 g by mouth 2 (two) times daily.   INSULIN LISP & LISP PROT (HUM) [place]   insulin lispro 100 UNIT/ML injection Commonly known as:  HUMALOG Inject into the skin. Insulin pump   irbesartan-hydrochlorothiazide 300-12.5 MG tablet Commonly known as:  AVALIDE Take 1 tablet by mouth daily.   lidocaine-prilocaine cream Commonly known as:  EMLA Apply 1 application topically as needed.   montelukast 10 MG tablet Commonly known as:  SINGULAIR Take 10 mg by mouth at bedtime.   ondansetron 4 MG disintegrating tablet Commonly known as:  ZOFRAN-ODT Take 4 mg by mouth 4 (four) times daily as needed for nausea or vomiting.   pomalidomide  4 MG capsule Commonly known as:  POMALYST Take 1 capsule (4 mg total) by mouth daily. Take with water on days 1-21. Repeat every 28 days. [id number]   Vitamin D 50 MCG (2000 UT) Caps Take by mouth every morning.       Allergies: No Known Allergies  Past Medical History, Surgical history, Social history, and Family History were reviewed and updated.  Review of Systems: Review of Systems  Constitutional: Negative.   HENT: Negative.   Eyes: Negative.   Respiratory: Negative.   Cardiovascular: Negative.   Gastrointestinal: Negative.   Genitourinary: Negative.   Musculoskeletal: Negative.   Skin: Negative.   Neurological: Negative.   Endo/Heme/Allergies: Negative.   Psychiatric/Behavioral: Negative.       Physical Exam:  weight is 195 lb (88.5 kg). His temperature is 97.7 F (36.5 C). His blood pressure is 118/56 (abnormal) and his pulse is 68. His respiration is 16 and oxygen saturation is 99%.   Wt Readings from Last 3 Encounters:  04/06/18 195 lb (88.5 kg)  03/23/18 201 lb (91.2 kg)  02/16/18 203 lb (92.1 kg)    Physical Exam  Constitutional: He is oriented to person, place, and time.  HENT:  Head: Normocephalic and atraumatic.  Mouth/Throat: Oropharynx is clear and moist.  Eyes: Pupils are equal, round, and reactive to light. EOM are normal.  Neck: Normal range of motion.  Cardiovascular: Normal rate, regular rhythm and normal heart sounds.  Pulmonary/Chest: Effort normal and breath sounds normal.  Abdominal: Soft. Bowel sounds are normal.  Musculoskeletal: Normal range of motion. He exhibits no edema, tenderness or deformity.  Lymphadenopathy:    He has no cervical adenopathy.  Neurological: He is alert and oriented to person, place, and time.  Skin: Skin is warm and dry. No rash noted. No erythema.  Psychiatric: He has a normal mood and affect. His behavior is normal. Judgment and thought content normal.  Vitals reviewed.    Lab Results  Component Value Date   WBC 4.1 04/06/2018   HGB 14.1 04/06/2018   HCT 44.4 04/06/2018   MCV 100.0 04/06/2018   PLT 204 04/06/2018   No results found for: FERRITIN, IRON, TIBC, UIBC, IRONPCTSAT Lab Results  Component Value Date   RBC 4.44 04/06/2018   Lab Results  Component Value Date   KPAFRELGTCHN 80.4 (H) 03/23/2018   LAMBDASER 12.0 03/23/2018   KAPLAMBRATIO 6.70 (H) 03/23/2018   Lab Results  Component Value Date   IGGSERUM 431 (L) 03/23/2018   IGGSERUM 445 (L) 03/23/2018   IGA 48 (L) 03/23/2018   IGA 54 (L) 03/23/2018   IGMSERUM 17 03/23/2018   IGMSERUM 18 03/23/2018   Lab Results  Component Value Date   TOTALPROTELP 4.8 (L) 03/23/2018   ALBUMINELP 2.7 (L) 03/23/2018   A1GS 0.3 03/23/2018   A2GS 0.8  03/23/2018   BETS 0.6 (L) 03/23/2018   BETA2SER 0.3 04/19/2015   GAMS 0.4 03/23/2018   MSPIKE 0.1 (H) 03/23/2018   SPEI Comment 12/05/2017     Chemistry      Component Value Date/Time   NA 137 04/06/2018 1040   NA 142 04/23/2017 1431   NA 139 07/18/2016 1135   K 3.8 04/06/2018 1040   K 4.0 04/23/2017 1431   K 4.4 07/18/2016 1135   CL 105 04/06/2018 1040   CL 105 04/23/2017 1431   CO2 27 04/06/2018 1040   CO2 28 04/23/2017 1431   CO2 27 07/18/2016 1135   BUN 12 04/06/2018 1040   BUN 20  04/23/2017 1431   BUN 12.2 07/18/2016 1135   CREATININE 1.00 04/06/2018 1040   CREATININE 1.2 04/23/2017 1431   CREATININE 0.9 07/18/2016 1135      Component Value Date/Time   CALCIUM 8.7 04/06/2018 1040   CALCIUM 9.3 04/23/2017 1431   CALCIUM 8.9 07/18/2016 1135   ALKPHOS 59 04/06/2018 1040   ALKPHOS 64 04/23/2017 1431   ALKPHOS 58 07/18/2016 1135   AST 18 04/06/2018 1040   AST 15 07/18/2016 1135   ALT 20 04/06/2018 1040   ALT 22 04/23/2017 1431   ALT 20 07/18/2016 1135   BILITOT 0.9 04/06/2018 1040   BILITOT 1.24 (H) 07/18/2016 1135      Impression and Plan: Rickey [name] is a very pleasant 77 yo caucasian gentleman with 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   Hopefully, he will be able to tolerate the Kyprolis.  Again, I think adding Cytoxan may not be a bad idea along with the Kyprolis.  He is taking Singulair to help with toxicity.  Hopefully the Decadron will help with toxicity.  I hope that this is not an indicator that his light chain myeloma is becoming more resilient.  One option that we always could consider would be to think about protocol therapy with CAR-T intervention.  I know that [facility] has this.  I know that our colleagues down in [place] also have this.  When I see him back, I will have to talk to him about a bone marrow biopsy.  I think this would be helpful at this stage in the game.  We can see what his cytogenetics have to  show.  I will plan to see him back after Thanksgiving.  He will then start his second cycle of treatment.     [name], MD 11/11/20194:10 PM

## 2018-04-06 NOTE — Patient Instructions (Addendum)
Carfilzomib injection What is this medicine? CARFILZOMIB (kar FILZ oh mib) targets a specific protein within cancer cells and stops the cancer cells from growing. It is used to treat multiple myeloma. This medicine may be used for other purposes; ask your health care provider or pharmacist if you have questions. COMMON BRAND NAME(S): KYPROLIS What should I tell my health care provider before I take this medicine? They need to know if you have any of these conditions: -heart disease -history of blood clots -irregular heartbeat -kidney disease -liver disease -lung or breathing disease -an unusual or allergic reaction to carfilzomib, or other medicines, foods, dyes, or preservatives -pregnant or trying to get pregnant -breast-feeding How should I use this medicine? This medicine is for injection or infusion into a vein. It is given by a health care professional in a hospital or clinic setting. Talk to your pediatrician regarding the use of this medicine in children. Special care may be needed. Overdosage: If you think you have taken too much of this medicine contact a poison control center or emergency room at once. NOTE: This medicine is only for you. Do not share this medicine with others. What if I miss a dose? It is important not to miss your dose. Call your doctor or health care professional if you are unable to keep an appointment. What may interact with this medicine? Interactions are not expected. Give your health care provider a list of all the medicines, herbs, non-prescription drugs, or dietary supplements you use. Also tell them if you smoke, drink alcohol, or use illegal drugs. Some items may interact with your medicine. This list may not describe all possible interactions. Give your health care provider a list of all the medicines, herbs, non-prescription drugs, or dietary supplements you use. Also tell them if you smoke, drink alcohol, or use illegal drugs. Some items may  interact with your medicine. What should I watch for while using this medicine? Your condition will be monitored carefully while you are receiving this medicine. Report any side effects. Continue your course of treatment even though you feel ill unless your doctor tells you to stop. You may need blood work done while you are taking this medicine. Do not become pregnant while taking this medicine or for at least 30 days after stopping it. Women should inform their doctor if they wish to become pregnant or think they might be pregnant. There is a potential for serious side effects to an unborn child. Men should not father a child while taking this medicine and for 90 days after stopping it. Talk to your health care professional or pharmacist for more information. Do not breast-feed an infant while taking this medicine. Check with your doctor or health care professional if you get an attack of severe diarrhea, nausea and vomiting, or if you sweat a lot. The loss of too much body fluid can make it dangerous for you to take this medicine. You may get dizzy. Do not drive, use machinery, or do anything that needs mental alertness until you know how this medicine affects you. Do not stand or sit up quickly, especially if you are an older patient. This reduces the risk of dizzy or fainting spells. What side effects may I notice from receiving this medicine? Side effects that you should report to your doctor or health care professional as soon as possible: -allergic reactions like skin rash, itching or hives, swelling of the face, lips, or tongue -confusion -dizziness -feeling faint or lightheaded -fever or chills -  palpitations -seizures -signs and symptoms of bleeding such as bloody or black, tarry stools; red or dark-brown urine; spitting up blood or brown material that looks like coffee grounds; red spots on the skin; unusual bruising or bleeding including from the eye, gums, or nose -signs and symptoms of  a blood clot such as breathing problems; changes in vision; chest pain; severe, sudden headache; pain, swelling, warmth in the leg; trouble speaking; sudden numbness or weakness of the face, arm or leg -signs and symptoms of kidney injury like trouble passing urine or change in the amount of urine -signs and symptoms of liver injury like dark yellow or brown urine; general ill feeling or flu-like symptoms; light-colored stools; loss of appetite; nausea; right upper belly pain; unusually weak or tired; yellowing of the eyes or skin Side effects that usually do not require medical attention (report to your doctor or health care professional if they continue or are bothersome): -back pain -cough -diarrhea -headache -muscle cramps -vomiting This list may not describe all possible side effects. Call your doctor for medical advice about side effects. You may report side effects to FDA at 1-800-FDA-1088. Where should I keep my medicine? This drug is given in a hospital or clinic and will not be stored at home. NOTE: This sheet is a summary. It may not cover all possible information. If you have questions about this medicine, talk to your doctor, pharmacist, or health care provider.  2018 Elsevier/Gold Standard (2015-06-15 13:39:23) Dexamethasone injection What is this medicine? DEXAMETHASONE (dex a METH a sone) is a corticosteroid. It is used to treat inflammation of the skin, joints, lungs, and other organs. Common conditions treated include asthma, allergies, and arthritis. It is also used for other conditions, like blood disorders and diseases of the adrenal glands. This medicine may be used for other purposes; ask your health care provider or pharmacist if you have questions. COMMON BRAND NAME(S): Decadron, DoubleDex, Simplist Dexamethasone, Solurex What should I tell my health care provider before I take this medicine? They need to know if you have any of these conditions: -blood clotting  problems -Cushing's syndrome -diabetes -glaucoma -heart problems or disease -high blood pressure -infection like herpes, measles, tuberculosis, or chickenpox -kidney disease -liver disease -mental problems -myasthenia gravis -osteoporosis -previous heart attack -seizures -stomach, ulcer or intestine disease including colitis and diverticulitis -thyroid problem -an unusual or allergic reaction to dexamethasone, corticosteroids, other medicines, lactose, foods, dyes, or preservatives -pregnant or trying to get pregnant -breast-feeding How should I use this medicine? This medicine is for injection into a muscle, joint, lesion, soft tissue, or vein. It is given by a health care professional in a hospital or clinic setting. Talk to your pediatrician regarding the use of this medicine in children. Special care may be needed. Overdosage: If you think you have taken too much of this medicine contact a poison control center or emergency room at once. NOTE: This medicine is only for you. Do not share this medicine with others. What if I miss a dose? This may not apply. If you are having a series of injections over a prolonged period, try not to miss an appointment. Call your doctor or health care professional to reschedule if you are unable to keep an appointment. What may interact with this medicine? Do not take this medicine with any of the following medications: -mifepristone, RU-486 -vaccines This medicine may also interact with the following medications: -amphotericin B -antibiotics like clarithromycin, erythromycin, and troleandomycin -aspirin and aspirin-like drugs -barbiturates like  phenobarbital -carbamazepine -cholestyramine -cholinesterase inhibitors like donepezil, galantamine, rivastigmine, and tacrine -cyclosporine -digoxin -diuretics -ephedrine -male hormones, like estrogens or progestins and birth control pills -indinavir -isoniazid -ketoconazole -medicines for  diabetes -medicines that improve muscle tone or strength for conditions like myasthenia gravis -NSAIDs, medicines for pain and inflammation, like ibuprofen or naproxen -phenytoin -rifampin -thalidomide -warfarin This list may not describe all possible interactions. Give your health care provider a list of all the medicines, herbs, non-prescription drugs, or dietary supplements you use. Also tell them if you smoke, drink alcohol, or use illegal drugs. Some items may interact with your medicine. What should I watch for while using this medicine? Your condition will be monitored carefully while you are receiving this medicine. If you are taking this medicine for a long time, carry an identification card with your name and address, the type and dose of your medicine, and your doctor's name and address. This medicine may increase your risk of getting an infection. Stay away from people who are sick. Tell your doctor or health care professional if you are around anyone with measles or chickenpox. Talk to your health care provider before you get any vaccines that you take this medicine. If you are going to have surgery, tell your doctor or health care professional that you have taken this medicine within the last twelve months. Ask your doctor or health care professional about your diet. You may need to lower the amount of salt you eat. The medicine can increase your blood sugar. If you are a diabetic check with your doctor if you need help adjusting the dose of your diabetic medicine. What side effects may I notice from receiving this medicine? Side effects that you should report to your doctor or health care professional as soon as possible: -allergic reactions like skin rash, itching or hives, swelling of the face, lips, or tongue -black or tarry stools -change in the amount of urine -changes in vision -confusion, excitement, restlessness, a false sense of well-being -fever, sore throat, sneezing,  cough, or other signs of infection, wounds that will not heal -hallucinations -increased thirst -mental depression, mood swings, mistaken feelings of self importance or of being mistreated -pain in hips, back, ribs, arms, shoulders, or legs -pain, redness, or irritation at the injection site -redness, blistering, peeling or loosening of the skin, including inside the mouth -rounding out of face -swelling of feet or lower legs -unusual bleeding or bruising -unusual tired or weak -wounds that do not heal Side effects that usually do not require medical attention (report to your doctor or health care professional if they continue or are bothersome): -diarrhea or constipation -change in taste -headache -nausea, vomiting -skin problems, acne, thin and shiny skin -touble sleeping -unusual growth of hair on the face or body -weight gain This list may not describe all possible side effects. Call your doctor for medical advice about side effects. You may report side effects to FDA at 1-800-FDA-1088. Where should I keep my medicine? This drug is given in a hospital or clinic and will not be stored at home. NOTE: This sheet is a summary. It may not cover all possible information. If you have questions about this medicine, talk to your doctor, pharmacist, or health care provider.  2018 Elsevier/Gold Standard (2007-09-03 14:04:12)

## 2018-04-07 ENCOUNTER — Telehealth: Payer: Self-pay | Admitting: Hematology & Oncology

## 2018-04-07 NOTE — Telephone Encounter (Signed)
Appointments scheduled and I spoke with patient regarding dates/times.  Letter / calendar mailed per 11/11 los

## 2018-04-13 ENCOUNTER — Other Ambulatory Visit: Payer: Self-pay

## 2018-04-13 ENCOUNTER — Other Ambulatory Visit: Payer: Self-pay | Admitting: *Deleted

## 2018-04-13 ENCOUNTER — Inpatient Hospital Stay: Payer: Medicare Other

## 2018-04-13 ENCOUNTER — Other Ambulatory Visit: Payer: Medicare Other

## 2018-04-13 ENCOUNTER — Ambulatory Visit: Payer: Medicare Other

## 2018-04-13 VITALS — BP 110/53 | HR 75 | Temp 97.7°F | Resp 18

## 2018-04-13 DIAGNOSIS — C9002 Multiple myeloma in relapse: Secondary | ICD-10-CM | POA: Diagnosis not present

## 2018-04-13 DIAGNOSIS — C9000 Multiple myeloma not having achieved remission: Secondary | ICD-10-CM

## 2018-04-13 DIAGNOSIS — Z5112 Encounter for antineoplastic immunotherapy: Secondary | ICD-10-CM | POA: Diagnosis not present

## 2018-04-13 LAB — CBC WITH DIFFERENTIAL (CANCER CENTER ONLY)
Abs Immature Granulocytes: 0.03 10*3/uL (ref 0.00–0.07)
BASOS ABS: 0.1 10*3/uL (ref 0.0–0.1)
Basophils Relative: 1 %
Eosinophils Absolute: 0.5 10*3/uL (ref 0.0–0.5)
Eosinophils Relative: 12 %
HEMATOCRIT: 45.9 % (ref 39.0–52.0)
HEMOGLOBIN: 15.1 g/dL (ref 13.0–17.0)
Immature Granulocytes: 1 %
LYMPHS ABS: 1.4 10*3/uL (ref 0.7–4.0)
LYMPHS PCT: 31 %
MCH: 33.3 pg (ref 26.0–34.0)
MCHC: 32.9 g/dL (ref 30.0–36.0)
MCV: 101.3 fL — ABNORMAL HIGH (ref 80.0–100.0)
Monocytes Absolute: 0.7 10*3/uL (ref 0.1–1.0)
Monocytes Relative: 16 %
NEUTROS ABS: 1.7 10*3/uL (ref 1.7–7.7)
NRBC: 0 % (ref 0.0–0.2)
Neutrophils Relative %: 39 %
Platelet Count: 150 10*3/uL (ref 150–400)
RBC: 4.53 MIL/uL (ref 4.22–5.81)
RDW: 14.4 % (ref 11.5–15.5)
WBC: 4.4 10*3/uL (ref 4.0–10.5)

## 2018-04-13 LAB — CMP (CANCER CENTER ONLY)
ALBUMIN: 3 g/dL — AB (ref 3.5–5.0)
ALK PHOS: 55 U/L (ref 26–84)
ALT: 19 U/L (ref 10–47)
AST: 17 U/L (ref 11–38)
Anion gap: 6 (ref 5–15)
BILIRUBIN TOTAL: 0.9 mg/dL (ref 0.2–1.6)
BUN: 15 mg/dL (ref 7–22)
CHLORIDE: 105 mmol/L (ref 98–108)
CO2: 27 mmol/L (ref 18–33)
Calcium: 9 mg/dL (ref 8.0–10.3)
Creatinine: 0.8 mg/dL (ref 0.60–1.20)
Glucose, Bld: 169 mg/dL — ABNORMAL HIGH (ref 73–118)
POTASSIUM: 4.2 mmol/L (ref 3.3–4.7)
SODIUM: 138 mmol/L (ref 128–145)
Total Protein: 5.4 g/dL — ABNORMAL LOW (ref 6.4–8.1)

## 2018-04-13 MED ORDER — SODIUM CHLORIDE 0.9% FLUSH
10.0000 mL | Freq: Once | INTRAVENOUS | Status: AC
  Administered 2018-04-13: 10 mL
  Filled 2018-04-13: qty 10

## 2018-04-13 MED ORDER — SODIUM CHLORIDE 0.9% FLUSH
10.0000 mL | INTRAVENOUS | Status: DC | PRN
  Administered 2018-04-13: 10 mL
  Filled 2018-04-13: qty 10

## 2018-04-13 MED ORDER — SODIUM CHLORIDE 0.9 % IV SOLN
20.0000 mg | Freq: Once | INTRAVENOUS | Status: AC
  Administered 2018-04-13: 20 mg via INTRAVENOUS
  Filled 2018-04-13: qty 2

## 2018-04-13 MED ORDER — ACETAMINOPHEN 325 MG PO TABS
ORAL_TABLET | ORAL | Status: AC
  Filled 2018-04-13: qty 2

## 2018-04-13 MED ORDER — PROCHLORPERAZINE MALEATE 10 MG PO TABS
ORAL_TABLET | ORAL | Status: AC
  Filled 2018-04-13: qty 1

## 2018-04-13 MED ORDER — SODIUM CHLORIDE 0.9 % IV SOLN
Freq: Once | INTRAVENOUS | Status: AC
  Administered 2018-04-13: 13:00:00 via INTRAVENOUS
  Filled 2018-04-13: qty 250

## 2018-04-13 MED ORDER — DIPHENHYDRAMINE HCL 25 MG PO CAPS
50.0000 mg | ORAL_CAPSULE | Freq: Once | ORAL | Status: AC
  Administered 2018-04-13: 50 mg via ORAL

## 2018-04-13 MED ORDER — HEPARIN SOD (PORK) LOCK FLUSH 100 UNIT/ML IV SOLN
500.0000 [IU] | Freq: Once | INTRAVENOUS | Status: AC | PRN
  Administered 2018-04-13: 500 [IU]
  Filled 2018-04-13: qty 5

## 2018-04-13 MED ORDER — PROCHLORPERAZINE MALEATE 10 MG PO TABS
10.0000 mg | ORAL_TABLET | Freq: Once | ORAL | Status: AC
  Administered 2018-04-13: 10 mg via ORAL

## 2018-04-13 MED ORDER — SODIUM CHLORIDE 0.9 % IV SOLN
Freq: Once | INTRAVENOUS | Status: AC
  Administered 2018-04-13: 12:00:00 via INTRAVENOUS
  Filled 2018-04-13: qty 250

## 2018-04-13 MED ORDER — DIPHENHYDRAMINE HCL 25 MG PO CAPS
ORAL_CAPSULE | ORAL | Status: AC
  Filled 2018-04-13: qty 2

## 2018-04-13 MED ORDER — ACETAMINOPHEN 325 MG PO TABS
650.0000 mg | ORAL_TABLET | Freq: Once | ORAL | Status: AC
  Administered 2018-04-13: 650 mg via ORAL

## 2018-04-13 MED ORDER — DEXTROSE 5 % IV SOLN
57.0000 mg/m2 | Freq: Once | INTRAVENOUS | Status: AC
  Administered 2018-04-13: 120 mg via INTRAVENOUS
  Filled 2018-04-13: qty 60

## 2018-04-13 NOTE — Patient Instructions (Signed)
Cancer Center Discharge Instructions for Patients Receiving Chemotherapy  Today you received the following chemotherapy agents:  Kyprolis  To help prevent nausea and vomiting after your treatment, we encourage you to take your nausea medication as prescribed.   If you develop nausea and vomiting that is not controlled by your nausea medication, call the clinic.   BELOW ARE SYMPTOMS THAT SHOULD BE REPORTED IMMEDIATELY:  *FEVER GREATER THAN 100.5 F  *CHILLS WITH OR WITHOUT FEVER  NAUSEA AND VOMITING THAT IS NOT CONTROLLED WITH YOUR NAUSEA MEDICATION  *UNUSUAL SHORTNESS OF BREATH  *UNUSUAL BRUISING OR BLEEDING  TENDERNESS IN MOUTH AND THROAT WITH OR WITHOUT PRESENCE OF ULCERS  *URINARY PROBLEMS  *BOWEL PROBLEMS  UNUSUAL RASH Items with * indicate a potential emergency and should be followed up as soon as possible.  Feel free to call the clinic should you have any questions or concerns. The clinic phone number is [phone].  Please show the CHEMO ALERT CARD at check-in to the Emergency Department and triage nurse.   

## 2018-04-13 NOTE — Progress Notes (Signed)
Patient request 650 mg Tylenol and 50 mg Benadryl po before each Kyprolis treatment as he stated he did well with his treatment last week with these pre meds. Ok to add to treatment plan for each cycle per Dr. Marin Olp.

## 2018-04-13 NOTE — Patient Instructions (Signed)
Implanted Port Home Guide An implanted port is a type of central line that is placed under the skin. Central lines are used to provide IV access when treatment or nutrition needs to be given through a person's veins. Implanted ports are used for long-term IV access. An implanted port may be placed because:  You need IV medicine that would be irritating to the small veins in your hands or arms.  You need long-term IV medicines, such as antibiotics.  You need IV nutrition for a long period.  You need frequent blood draws for lab tests.  You need dialysis.  Implanted ports are usually placed in the chest area, but they can also be placed in the upper arm, the abdomen, or the leg. An implanted port has two main parts:  Reservoir. The reservoir is round and will appear as a small, raised area under your skin. The reservoir is the part where a needle is inserted to give medicines or draw blood.  Catheter. The catheter is a thin, flexible tube that extends from the reservoir. The catheter is placed into a large vein. Medicine that is inserted into the reservoir goes into the catheter and then into the vein.  How will I care for my incision site? Do not get the incision site wet. Bathe or shower as directed by your health care provider. How is my port accessed? Special steps must be taken to access the port:  Before the port is accessed, a numbing cream can be placed on the skin. This helps numb the skin over the port site.  Your health care provider uses a sterile technique to access the port. ? Your health care provider must put on a mask and sterile gloves. ? The skin over your port is cleaned carefully with an antiseptic and allowed to dry. ? The port is gently pinched between sterile gloves, and a needle is inserted into the port.  Only "non-coring" port needles should be used to access the port. Once the port is accessed, a blood return should be checked. This helps ensure that the port  is in the vein and is not clogged.  If your port needs to remain accessed for a constant infusion, a clear (transparent) bandage will be placed over the needle site. The bandage and needle will need to be changed every week, or as directed by your health care provider.  Keep the bandage covering the needle clean and dry. Do not get it wet. Follow your health care provider's instructions on how to take a shower or bath while the port is accessed.  If your port does not need to stay accessed, no bandage is needed over the port.  What is flushing? Flushing helps keep the port from getting clogged. Follow your health care provider's instructions on how and when to flush the port. Ports are usually flushed with saline solution or a medicine called heparin. The need for flushing will depend on how the port is used.  If the port is used for intermittent medicines or blood draws, the port will need to be flushed: ? After medicines have been given. ? After blood has been drawn. ? As part of routine maintenance.  If a constant infusion is running, the port may not need to be flushed.  How long will my port stay implanted? The port can stay in for as long as your health care provider thinks it is needed. When it is time for the port to come out, surgery will be   done to remove it. The procedure is similar to the one performed when the port was put in. When should I seek immediate medical care? When you have an implanted port, you should seek immediate medical care if:  You notice a bad smell coming from the incision site.  You have swelling, redness, or drainage at the incision site.  You have more swelling or pain at the port site or the surrounding area.  You have a fever that is not controlled with medicine.  This information is not intended to replace advice given to you by your health care provider. Make sure you discuss any questions you have with your health care provider. Document  Released: 05/13/2005 Document Revised: 10/19/2015 Document Reviewed: 01/18/2013 Elsevier Interactive Patient Education  2017 [employer] Inc[employer]  

## 2018-04-14 ENCOUNTER — Telehealth: Payer: Self-pay | Admitting: Hematology & Oncology

## 2018-04-14 NOTE — Telephone Encounter (Signed)
Faxed medical records to: [facility] I need to have records for this patient faxed to [phone].   They will need everything from April 2017 to present, please.   He has an appointment with Dr Wanda Plump at 2:30 on 12/12.   Thanks so much  [employer]   Address: [facility], [address] [place], [place] [address]  Phone: [phone]

## 2018-04-14 NOTE — Telephone Encounter (Signed)
I have spoken with patient regarding appointment with Dr Wanda Plump in [facility].  His appointment is scheduled for 12/12 @ 2:30.  I also called and spoke with Olivia Mackie at Dr Irven Easterly office and she will need all records from April 2017 to present faxed to her at [phone] and they will set up treatments for Jan, etc..... In basket message to Otilio Carpen to have records sent the them.

## 2018-04-15 ENCOUNTER — Encounter: Payer: Self-pay | Admitting: *Deleted

## 2018-04-17 ENCOUNTER — Other Ambulatory Visit: Payer: Self-pay | Admitting: *Deleted

## 2018-04-17 DIAGNOSIS — C9000 Multiple myeloma not having achieved remission: Secondary | ICD-10-CM

## 2018-04-20 ENCOUNTER — Inpatient Hospital Stay: Payer: Medicare Other

## 2018-04-20 ENCOUNTER — Other Ambulatory Visit: Payer: Medicare Other

## 2018-04-20 ENCOUNTER — Ambulatory Visit: Payer: Medicare Other

## 2018-04-20 VITALS — BP 135/54 | HR 73 | Temp 97.8°F | Resp 18

## 2018-04-20 DIAGNOSIS — C9000 Multiple myeloma not having achieved remission: Secondary | ICD-10-CM

## 2018-04-20 DIAGNOSIS — Z5112 Encounter for antineoplastic immunotherapy: Secondary | ICD-10-CM | POA: Diagnosis not present

## 2018-04-20 DIAGNOSIS — C9002 Multiple myeloma in relapse: Secondary | ICD-10-CM | POA: Diagnosis not present

## 2018-04-20 LAB — CBC WITH DIFFERENTIAL (CANCER CENTER ONLY)
ABS IMMATURE GRANULOCYTES: 0.04 10*3/uL (ref 0.00–0.07)
Basophils Absolute: 0.1 10*3/uL (ref 0.0–0.1)
Basophils Relative: 1 %
EOS PCT: 9 %
Eosinophils Absolute: 0.6 10*3/uL — ABNORMAL HIGH (ref 0.0–0.5)
HEMATOCRIT: 51 % (ref 39.0–52.0)
HEMOGLOBIN: 16.6 g/dL (ref 13.0–17.0)
Immature Granulocytes: 1 %
LYMPHS PCT: 22 %
Lymphs Abs: 1.6 10*3/uL (ref 0.7–4.0)
MCH: 32.5 pg (ref 26.0–34.0)
MCHC: 32.5 g/dL (ref 30.0–36.0)
MCV: 100 fL (ref 80.0–100.0)
MONO ABS: 1.6 10*3/uL — AB (ref 0.1–1.0)
MONOS PCT: 22 %
NEUTROS ABS: 3.4 10*3/uL (ref 1.7–7.7)
Neutrophils Relative %: 45 %
Platelet Count: 144 10*3/uL — ABNORMAL LOW (ref 150–400)
RBC: 5.1 MIL/uL (ref 4.22–5.81)
RDW: 14.1 % (ref 11.5–15.5)
WBC Count: 7.4 10*3/uL (ref 4.0–10.5)
nRBC: 0 % (ref 0.0–0.2)

## 2018-04-20 LAB — CMP (CANCER CENTER ONLY)
ALBUMIN: 3.5 g/dL (ref 3.5–5.0)
ALT: 22 U/L (ref 10–47)
AST: 22 U/L (ref 11–38)
Alkaline Phosphatase: 50 U/L (ref 26–84)
Anion gap: 1 — ABNORMAL LOW (ref 5–15)
BUN: 17 mg/dL (ref 7–22)
CHLORIDE: 107 mmol/L (ref 98–108)
CO2: 27 mmol/L (ref 18–33)
CREATININE: 0.8 mg/dL (ref 0.60–1.20)
Calcium: 9.4 mg/dL (ref 8.0–10.3)
GLUCOSE: 211 mg/dL — AB (ref 73–118)
Potassium: 4.5 mmol/L (ref 3.3–4.7)
SODIUM: 135 mmol/L (ref 128–145)
Total Bilirubin: 1.2 mg/dL (ref 0.2–1.6)
Total Protein: 5.9 g/dL — ABNORMAL LOW (ref 6.4–8.1)

## 2018-04-20 MED ORDER — DIPHENHYDRAMINE HCL 25 MG PO CAPS
50.0000 mg | ORAL_CAPSULE | Freq: Once | ORAL | Status: AC
  Administered 2018-04-20: 50 mg via ORAL

## 2018-04-20 MED ORDER — SODIUM CHLORIDE 0.9 % IV SOLN
Freq: Once | INTRAVENOUS | Status: AC
  Administered 2018-04-20: 12:00:00 via INTRAVENOUS
  Filled 2018-04-20: qty 250

## 2018-04-20 MED ORDER — SODIUM CHLORIDE 0.9 % IV SOLN
Freq: Once | INTRAVENOUS | Status: AC
  Filled 2018-04-20: qty 250

## 2018-04-20 MED ORDER — PROCHLORPERAZINE MALEATE 10 MG PO TABS
10.0000 mg | ORAL_TABLET | Freq: Once | ORAL | Status: AC
  Administered 2018-04-20: 10 mg via ORAL

## 2018-04-20 MED ORDER — PROCHLORPERAZINE MALEATE 10 MG PO TABS
ORAL_TABLET | ORAL | Status: AC
  Filled 2018-04-20: qty 1

## 2018-04-20 MED ORDER — SODIUM CHLORIDE 0.9% FLUSH
10.0000 mL | INTRAVENOUS | Status: DC | PRN
  Administered 2018-04-20: 10 mL
  Filled 2018-04-20: qty 10

## 2018-04-20 MED ORDER — HEPARIN SOD (PORK) LOCK FLUSH 100 UNIT/ML IV SOLN
500.0000 [IU] | Freq: Once | INTRAVENOUS | Status: AC | PRN
  Administered 2018-04-20: 500 [IU]
  Filled 2018-04-20: qty 5

## 2018-04-20 MED ORDER — SODIUM CHLORIDE 0.9% FLUSH
10.0000 mL | Freq: Once | INTRAVENOUS | Status: AC
  Administered 2018-04-20: 10 mL
  Filled 2018-04-20: qty 10

## 2018-04-20 MED ORDER — DEXTROSE 5 % IV SOLN
71.0000 mg/m2 | Freq: Once | INTRAVENOUS | Status: DC
  Filled 2018-04-20: qty 75

## 2018-04-20 MED ORDER — ACETAMINOPHEN 325 MG PO TABS
ORAL_TABLET | ORAL | Status: AC
  Filled 2018-04-20: qty 2

## 2018-04-20 MED ORDER — DEXTROSE 5 % IV SOLN
71.0000 mg/m2 | Freq: Once | INTRAVENOUS | Status: AC
  Administered 2018-04-20: 150 mg via INTRAVENOUS
  Filled 2018-04-20: qty 60

## 2018-04-20 MED ORDER — DEXAMETHASONE SODIUM PHOSPHATE 10 MG/ML IJ SOLN
INTRAMUSCULAR | Status: AC
  Filled 2018-04-20: qty 1

## 2018-04-20 MED ORDER — DIPHENHYDRAMINE HCL 25 MG PO CAPS
ORAL_CAPSULE | ORAL | Status: AC
  Filled 2018-04-20: qty 2

## 2018-04-20 MED ORDER — ACETAMINOPHEN 325 MG PO TABS
650.0000 mg | ORAL_TABLET | Freq: Once | ORAL | Status: AC
  Administered 2018-04-20: 650 mg via ORAL

## 2018-04-20 MED ORDER — SODIUM CHLORIDE 0.9 % IV SOLN
20.0000 mg | Freq: Once | INTRAVENOUS | Status: AC
  Administered 2018-04-20: 20 mg via INTRAVENOUS
  Filled 2018-04-20: qty 2

## 2018-04-20 NOTE — Patient Instructions (Signed)
Implanted Port Home Guide An implanted port is a type of central line that is placed under the skin. Central lines are used to provide IV access when treatment or nutrition needs to be given through a person's veins. Implanted ports are used for long-term IV access. An implanted port may be placed because:  You need IV medicine that would be irritating to the small veins in your hands or arms.  You need long-term IV medicines, such as antibiotics.  You need IV nutrition for a long period.  You need frequent blood draws for lab tests.  You need dialysis.  Implanted ports are usually placed in the chest area, but they can also be placed in the upper arm, the abdomen, or the leg. An implanted port has two main parts:  Reservoir. The reservoir is round and will appear as a small, raised area under your skin. The reservoir is the part where a needle is inserted to give medicines or draw blood.  Catheter. The catheter is a thin, flexible tube that extends from the reservoir. The catheter is placed into a large vein. Medicine that is inserted into the reservoir goes into the catheter and then into the vein.  How will I care for my incision site? Do not get the incision site wet. Bathe or shower as directed by your health care provider. How is my port accessed? Special steps must be taken to access the port:  Before the port is accessed, a numbing cream can be placed on the skin. This helps numb the skin over the port site.  Your health care provider uses a sterile technique to access the port. ? Your health care provider must put on a mask and sterile gloves. ? The skin over your port is cleaned carefully with an antiseptic and allowed to dry. ? The port is gently pinched between sterile gloves, and a needle is inserted into the port.  Only "non-coring" port needles should be used to access the port. Once the port is accessed, a blood return should be checked. This helps ensure that the port  is in the vein and is not clogged.  If your port needs to remain accessed for a constant infusion, a clear (transparent) bandage will be placed over the needle site. The bandage and needle will need to be changed every week, or as directed by your health care provider.  Keep the bandage covering the needle clean and dry. Do not get it wet. Follow your health care provider's instructions on how to take a shower or bath while the port is accessed.  If your port does not need to stay accessed, no bandage is needed over the port.  What is flushing? Flushing helps keep the port from getting clogged. Follow your health care provider's instructions on how and when to flush the port. Ports are usually flushed with saline solution or a medicine called heparin. The need for flushing will depend on how the port is used.  If the port is used for intermittent medicines or blood draws, the port will need to be flushed: ? After medicines have been given. ? After blood has been drawn. ? As part of routine maintenance.  If a constant infusion is running, the port may not need to be flushed.  How long will my port stay implanted? The port can stay in for as long as your health care provider thinks it is needed. When it is time for the port to come out, surgery will be   done to remove it. The procedure is similar to the one performed when the port was put in. When should I seek immediate medical care? When you have an implanted port, you should seek immediate medical care if:  You notice a bad smell coming from the incision site.  You have swelling, redness, or drainage at the incision site.  You have more swelling or pain at the port site or the surrounding area.  You have a fever that is not controlled with medicine.  This information is not intended to replace advice given to you by your health care provider. Make sure you discuss any questions you have with your health care provider. Document  Released: 05/13/2005 Document Revised: 10/19/2015 Document Reviewed: 01/18/2013 Elsevier Interactive Patient Education  2017 [employer] Inc[employer]  

## 2018-04-20 NOTE — Patient Instructions (Signed)
Carfilzomib injection What is this medicine? CARFILZOMIB (kar FILZ oh mib) targets a specific protein within cancer cells and stops the cancer cells from growing. It is used to treat multiple myeloma. This medicine may be used for other purposes; ask your health care provider or pharmacist if you have questions. COMMON BRAND NAME(S): KYPROLIS What should I tell my health care provider before I take this medicine? They need to know if you have any of these conditions: -heart disease -history of blood clots -irregular heartbeat -kidney disease -liver disease -lung or breathing disease -an unusual or allergic reaction to carfilzomib, or other medicines, foods, dyes, or preservatives -pregnant or trying to get pregnant -breast-feeding How should I use this medicine? This medicine is for injection or infusion into a vein. It is given by a health care professional in a hospital or clinic setting. Talk to your pediatrician regarding the use of this medicine in children. Special care may be needed. Overdosage: If you think you have taken too much of this medicine contact a poison control center or emergency room at once. NOTE: This medicine is only for you. Do not share this medicine with others. What if I miss a dose? It is important not to miss your dose. Call your doctor or health care professional if you are unable to keep an appointment. What may interact with this medicine? Interactions are not expected. Give your health care provider a list of all the medicines, herbs, non-prescription drugs, or dietary supplements you use. Also tell them if you smoke, drink alcohol, or use illegal drugs. Some items may interact with your medicine. This list may not describe all possible interactions. Give your health care provider a list of all the medicines, herbs, non-prescription drugs, or dietary supplements you use. Also tell them if you smoke, drink alcohol, or use illegal drugs. Some items may  interact with your medicine. What should I watch for while using this medicine? Your condition will be monitored carefully while you are receiving this medicine. Report any side effects. Continue your course of treatment even though you feel ill unless your doctor tells you to stop. You may need blood work done while you are taking this medicine. Do not become pregnant while taking this medicine or for at least 30 days after stopping it. Women should inform their doctor if they wish to become pregnant or think they might be pregnant. There is a potential for serious side effects to an unborn child. Men should not father a child while taking this medicine and for 90 days after stopping it. Talk to your health care professional or pharmacist for more information. Do not breast-feed an infant while taking this medicine. Check with your doctor or health care professional if you get an attack of severe diarrhea, nausea and vomiting, or if you sweat a lot. The loss of too much body fluid can make it dangerous for you to take this medicine. You may get dizzy. Do not drive, use machinery, or do anything that needs mental alertness until you know how this medicine affects you. Do not stand or sit up quickly, especially if you are an older patient. This reduces the risk of dizzy or fainting spells. What side effects may I notice from receiving this medicine? Side effects that you should report to your doctor or health care professional as soon as possible: -allergic reactions like skin rash, itching or hives, swelling of the face, lips, or tongue -confusion -dizziness -feeling faint or lightheaded -fever or chills -  palpitations -seizures -signs and symptoms of bleeding such as bloody or black, tarry stools; red or dark-brown urine; spitting up blood or brown material that looks like coffee grounds; red spots on the skin; unusual bruising or bleeding including from the eye, gums, or nose -signs and symptoms of  a blood clot such as breathing problems; changes in vision; chest pain; severe, sudden headache; pain, swelling, warmth in the leg; trouble speaking; sudden numbness or weakness of the face, arm or leg -signs and symptoms of kidney injury like trouble passing urine or change in the amount of urine -signs and symptoms of liver injury like dark yellow or brown urine; general ill feeling or flu-like symptoms; light-colored stools; loss of appetite; nausea; right upper belly pain; unusually weak or tired; yellowing of the eyes or skin Side effects that usually do not require medical attention (report to your doctor or health care professional if they continue or are bothersome): -back pain -cough -diarrhea -headache -muscle cramps -vomiting This list may not describe all possible side effects. Call your doctor for medical advice about side effects. You may report side effects to FDA at 1-800-FDA-1088. Where should I keep my medicine? This drug is given in a hospital or clinic and will not be stored at home. NOTE: This sheet is a summary. It may not cover all possible information. If you have questions about this medicine, talk to your doctor, pharmacist, or health care provider.  2018 Elsevier/Gold Standard (2015-06-15 13:39:23)  

## 2018-04-28 ENCOUNTER — Other Ambulatory Visit: Payer: Self-pay | Admitting: *Deleted

## 2018-04-28 DIAGNOSIS — C9000 Multiple myeloma not having achieved remission: Secondary | ICD-10-CM

## 2018-04-28 DIAGNOSIS — S91332A Puncture wound without foreign body, left foot, initial encounter: Secondary | ICD-10-CM

## 2018-04-28 MED ORDER — POMALIDOMIDE 4 MG PO CAPS: 4.0000 mg | ORAL_CAPSULE | Freq: Every day | ORAL | 0 refills | Status: DC

## 2018-05-04 ENCOUNTER — Inpatient Hospital Stay: Payer: Medicare Other

## 2018-05-04 ENCOUNTER — Other Ambulatory Visit: Payer: Self-pay

## 2018-05-04 ENCOUNTER — Inpatient Hospital Stay: Payer: Medicare Other | Attending: Hematology & Oncology | Admitting: Hematology & Oncology

## 2018-05-04 VITALS — BP 124/53 | HR 75 | Temp 98.5°F | Resp 17 | Wt 198.0 lb

## 2018-05-04 DIAGNOSIS — C9002 Multiple myeloma in relapse: Secondary | ICD-10-CM | POA: Diagnosis not present

## 2018-05-04 DIAGNOSIS — C9000 Multiple myeloma not having achieved remission: Secondary | ICD-10-CM

## 2018-05-04 DIAGNOSIS — Z5112 Encounter for antineoplastic immunotherapy: Secondary | ICD-10-CM | POA: Diagnosis not present

## 2018-05-04 LAB — CBC WITH DIFFERENTIAL (CANCER CENTER ONLY)
Abs Immature Granulocytes: 0.02 10*3/uL (ref 0.00–0.07)
BASOS ABS: 0.1 10*3/uL (ref 0.0–0.1)
BASOS PCT: 2 %
EOS PCT: 3 %
Eosinophils Absolute: 0.1 10*3/uL (ref 0.0–0.5)
HCT: 45.9 % (ref 39.0–52.0)
Hemoglobin: 14.7 g/dL (ref 13.0–17.0)
Immature Granulocytes: 0 %
Lymphocytes Relative: 24 %
Lymphs Abs: 1.1 10*3/uL (ref 0.7–4.0)
MCH: 32.5 pg (ref 26.0–34.0)
MCHC: 32 g/dL (ref 30.0–36.0)
MCV: 101.3 fL — ABNORMAL HIGH (ref 80.0–100.0)
Monocytes Absolute: 0.8 10*3/uL (ref 0.1–1.0)
Monocytes Relative: 16 %
NEUTROS ABS: 2.6 10*3/uL (ref 1.7–7.7)
NEUTROS PCT: 55 %
PLATELETS: 176 10*3/uL (ref 150–400)
RBC: 4.53 MIL/uL (ref 4.22–5.81)
RDW: 14.6 % (ref 11.5–15.5)
WBC Count: 4.7 10*3/uL (ref 4.0–10.5)
nRBC: 0 % (ref 0.0–0.2)

## 2018-05-04 LAB — CMP (CANCER CENTER ONLY)
ALT: 15 U/L (ref 0–44)
AST: 12 U/L — ABNORMAL LOW (ref 15–41)
Albumin: 3.6 g/dL (ref 3.5–5.0)
Alkaline Phosphatase: 43 U/L (ref 38–126)
Anion gap: 8 (ref 5–15)
BUN: 17 mg/dL (ref 8–23)
CO2: 24 mmol/L (ref 22–32)
Calcium: 8 mg/dL — ABNORMAL LOW (ref 8.9–10.3)
Chloride: 106 mmol/L (ref 98–111)
Creatinine: 0.78 mg/dL (ref 0.61–1.24)
GFR, Est AFR Am: >60 mL/min (ref >60)
GFR, Estimated: >60 mL/min (ref >60)
Glucose, Bld: 204 mg/dL — ABNORMAL HIGH (ref 70–99)
POTASSIUM: 4.2 mmol/L (ref 3.5–5.1)
Sodium: 138 mmol/L (ref 135–145)
Total Bilirubin: 1 mg/dL (ref 0.3–1.2)
Total Protein: 4.9 g/dL — ABNORMAL LOW (ref 6.5–8.1)

## 2018-05-04 MED ORDER — PROCHLORPERAZINE MALEATE 10 MG PO TABS
ORAL_TABLET | ORAL | Status: AC
  Filled 2018-05-04: qty 1

## 2018-05-04 MED ORDER — PROCHLORPERAZINE MALEATE 10 MG PO TABS
10.0000 mg | ORAL_TABLET | Freq: Once | ORAL | Status: AC
  Administered 2018-05-04: 10 mg via ORAL

## 2018-05-04 MED ORDER — DIPHENHYDRAMINE HCL 25 MG PO CAPS
ORAL_CAPSULE | ORAL | Status: AC
  Filled 2018-05-04: qty 2

## 2018-05-04 MED ORDER — SODIUM CHLORIDE 0.9 % IV SOLN
20.0000 mg | Freq: Once | INTRAVENOUS | Status: AC
  Administered 2018-05-04: 20 mg via INTRAVENOUS
  Filled 2018-05-04: qty 2

## 2018-05-04 MED ORDER — ACETAMINOPHEN 325 MG PO TABS
ORAL_TABLET | ORAL | Status: AC
  Filled 2018-05-04: qty 2

## 2018-05-04 MED ORDER — ACETAMINOPHEN 325 MG PO TABS
650.0000 mg | ORAL_TABLET | Freq: Once | ORAL | Status: AC
  Administered 2018-05-04: 650 mg via ORAL

## 2018-05-04 MED ORDER — SODIUM CHLORIDE 0.9 % IV SOLN
Freq: Once | INTRAVENOUS | Status: AC
  Administered 2018-05-04: 12:00:00 via INTRAVENOUS
  Filled 2018-05-04: qty 250

## 2018-05-04 MED ORDER — DIPHENHYDRAMINE HCL 25 MG PO CAPS
50.0000 mg | ORAL_CAPSULE | Freq: Once | ORAL | Status: AC
  Administered 2018-05-04: 50 mg via ORAL

## 2018-05-04 MED ORDER — HEPARIN SOD (PORK) LOCK FLUSH 100 UNIT/ML IV SOLN
500.0000 [IU] | Freq: Once | INTRAVENOUS | Status: AC | PRN
  Administered 2018-05-04: 500 [IU]
  Filled 2018-05-04: qty 5

## 2018-05-04 MED ORDER — DEXTROSE 5 % IV SOLN
71.0000 mg/m2 | Freq: Once | INTRAVENOUS | Status: AC
  Administered 2018-05-04: 150 mg via INTRAVENOUS
  Filled 2018-05-04: qty 60

## 2018-05-04 MED ORDER — SODIUM CHLORIDE 0.9% FLUSH
10.0000 mL | INTRAVENOUS | Status: DC | PRN
  Administered 2018-05-04: 10 mL
  Filled 2018-05-04: qty 10

## 2018-05-04 NOTE — Patient Instructions (Signed)
Implanted Port Home Guide An implanted port is a type of central line that is placed under the skin. Central lines are used to provide IV access when treatment or nutrition needs to be given through a person's veins. Implanted ports are used for long-term IV access. An implanted port may be placed because:  You need IV medicine that would be irritating to the small veins in your hands or arms.  You need long-term IV medicines, such as antibiotics.  You need IV nutrition for a long period.  You need frequent blood draws for lab tests.  You need dialysis.  Implanted ports are usually placed in the chest area, but they can also be placed in the upper arm, the abdomen, or the leg. An implanted port has two main parts:  Reservoir. The reservoir is round and will appear as a small, raised area under your skin. The reservoir is the part where a needle is inserted to give medicines or draw blood.  Catheter. The catheter is a thin, flexible tube that extends from the reservoir. The catheter is placed into a large vein. Medicine that is inserted into the reservoir goes into the catheter and then into the vein.  How will I care for my incision site? Do not get the incision site wet. Bathe or shower as directed by your health care provider. How is my port accessed? Special steps must be taken to access the port:  Before the port is accessed, a numbing cream can be placed on the skin. This helps numb the skin over the port site.  Your health care provider uses a sterile technique to access the port. ? Your health care provider must put on a mask and sterile gloves. ? The skin over your port is cleaned carefully with an antiseptic and allowed to dry. ? The port is gently pinched between sterile gloves, and a needle is inserted into the port.  Only "non-coring" port needles should be used to access the port. Once the port is accessed, a blood return should be checked. This helps ensure that the port  is in the vein and is not clogged.  If your port needs to remain accessed for a constant infusion, a clear (transparent) bandage will be placed over the needle site. The bandage and needle will need to be changed every week, or as directed by your health care provider.  Keep the bandage covering the needle clean and dry. Do not get it wet. Follow your health care provider's instructions on how to take a shower or bath while the port is accessed.  If your port does not need to stay accessed, no bandage is needed over the port.  What is flushing? Flushing helps keep the port from getting clogged. Follow your health care provider's instructions on how and when to flush the port. Ports are usually flushed with saline solution or a medicine called heparin. The need for flushing will depend on how the port is used.  If the port is used for intermittent medicines or blood draws, the port will need to be flushed: ? After medicines have been given. ? After blood has been drawn. ? As part of routine maintenance.  If a constant infusion is running, the port may not need to be flushed.  How long will my port stay implanted? The port can stay in for as long as your health care provider thinks it is needed. When it is time for the port to come out, surgery will be   done to remove it. The procedure is similar to the one performed when the port was put in. When should I seek immediate medical care? When you have an implanted port, you should seek immediate medical care if:  You notice a bad smell coming from the incision site.  You have swelling, redness, or drainage at the incision site.  You have more swelling or pain at the port site or the surrounding area.  You have a fever that is not controlled with medicine.  This information is not intended to replace advice given to you by your health care provider. Make sure you discuss any questions you have with your health care provider. Document  Released: 05/13/2005 Document Revised: 10/19/2015 Document Reviewed: 01/18/2013 Elsevier Interactive Patient Education  2017 [employer] Inc[employer]  

## 2018-05-04 NOTE — Progress Notes (Signed)
Hematology and Oncology Follow Up Visit  Rickey [name] [id number] February 04, 1941 77 y.o. 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 Elotuzumab/Pomalidomide/decadron -every 4 weeks- start 01/12/2018 Kyprolis/Pomalidomide/Decadron -- start 04/06/2018    Interim History:  Rickey [name] is here today for follow-up.  He is doing quite well.  So far, he has had no problems with the Kyprolis.  I am hoping that as we continue to increase his dose of Kyprolis that there will not be a problem.  His last Kappa Light chain from late October was 8 mg/dL.  He and his family had a wonderful Thanksgiving.  As always, they will be spending a lot of the winter at their beach house.  They will be going down after the holidays.  He has had no problems with fever.  He has had no issues with his blood sugars.  The Decadron does cause his blood sugar to increase.  However, he is very diligent in controlling this.  There is been no bleeding.  He has had no diarrhea.  He has had no rashes.  His appetite has been doing quite good.  As I said previously, he had a wonderful Thanksgiving.  Overall, his performance status is ECOG 1.    Medications:  Allergies as of 05/04/2018   No Known Allergies     Medication List        Accurate as of 05/04/18  6:25 PM. Always use your most recent med list.          acyclovir 400 MG tablet Commonly known as:  ZOVIRAX TAKE 1 TABLET(400 MG) BY MOUTH TWICE DAILY. 90 day supply   aspirin 81 MG tablet Take 81 mg by mouth daily.   atorvastatin 10 MG tablet Commonly known as:  LIPITOR Take 10 mg by mouth daily.   calcium-vitamin D 250-125 MG-UNIT tablet Commonly known as:  OSCAL Take 1 tablet by mouth  daily.   cycloSPORINE 0.05 % ophthalmic emulsion Commonly known as:  RESTASIS Place 1 drop into both eyes 2 (two) times daily.   fish oil-omega-3 fatty acids 1000 MG capsule Take 1 g by mouth 2 (two) times daily.   INSULIN LISP & LISP PROT (HUM) [place]   insulin lispro 100 UNIT/ML injection Commonly known as:  HUMALOG Inject into the skin. Insulin pump   irbesartan-hydrochlorothiazide 300-12.5 MG tablet Commonly known as:  AVALIDE Take 1 tablet by mouth daily.   lidocaine-prilocaine cream Commonly known as:  EMLA Apply 1 application topically as needed.   montelukast 10 MG tablet Commonly known as:  SINGULAIR Take 10 mg by mouth at bedtime.   ondansetron 4 MG disintegrating tablet Commonly known as:  ZOFRAN-ODT Take 4 mg by mouth 4 (four) times daily as needed for nausea or vomiting.   pomalidomide 4 MG capsule Commonly known as:  POMALYST Take 1 capsule (4 mg total) by mouth daily. Take with water on days 1-21. Repeat every 28 days. [id number]   Vitamin D 50 MCG (2000 UT) Caps Take by mouth every morning.       Allergies: No Known Allergies  Past Medical History, Surgical history, Social history, and Family History were reviewed and updated.  Review of Systems: Review of Systems  Constitutional:  Negative.   HENT: Negative.   Eyes: Negative.   Respiratory: Negative.   Cardiovascular: Negative.   Gastrointestinal: Negative.   Genitourinary: Negative.   Musculoskeletal: Negative.   Skin: Negative.   Neurological: Negative.   Endo/Heme/Allergies: Negative.   Psychiatric/Behavioral: Negative.      Physical Exam:  weight is 198 lb (89.8 kg). His oral temperature is 98.5 F (36.9 C). His blood pressure is 124/53 (abnormal) and his pulse is 75. His respiration is 17 and oxygen saturation is 100%.   Wt Readings from Last 3 Encounters:  05/04/18 198 lb (89.8 kg)  04/06/18 195 lb (88.5 kg)  03/23/18 201 lb (91.2 kg)    Physical Exam  Constitutional: He is  oriented to person, place, and time.  HENT:  Head: Normocephalic and atraumatic.  Mouth/Throat: Oropharynx is clear and moist.  Eyes: Pupils are equal, round, and reactive to light. EOM are normal.  Neck: Normal range of motion.  Cardiovascular: Normal rate, regular rhythm and normal heart sounds.  Pulmonary/Chest: Effort normal and breath sounds normal.  Abdominal: Soft. Bowel sounds are normal.  Musculoskeletal: Normal range of motion. He exhibits no edema, tenderness or deformity.  Lymphadenopathy:    He has no cervical adenopathy.  Neurological: He is alert and oriented to person, place, and time.  Skin: Skin is warm and dry. No rash noted. No erythema.  Psychiatric: He has a normal mood and affect. His behavior is normal. Judgment and thought content normal.  Vitals reviewed.    Lab Results  Component Value Date   WBC 4.7 05/04/2018   HGB 14.7 05/04/2018   HCT 45.9 05/04/2018   MCV 101.3 (H) 05/04/2018   PLT 176 05/04/2018   No results found for: FERRITIN, IRON, TIBC, UIBC, IRONPCTSAT Lab Results  Component Value Date   RBC 4.53 05/04/2018   Lab Results  Component Value Date   KPAFRELGTCHN 80.4 (H) 03/23/2018   LAMBDASER 12.0 03/23/2018   KAPLAMBRATIO 6.70 (H) 03/23/2018   Lab Results  Component Value Date   IGGSERUM 431 (L) 03/23/2018   IGGSERUM 445 (L) 03/23/2018   IGA 48 (L) 03/23/2018   IGA 54 (L) 03/23/2018   IGMSERUM 17 03/23/2018   IGMSERUM 18 03/23/2018   Lab Results  Component Value Date   TOTALPROTELP 4.8 (L) 03/23/2018   ALBUMINELP 2.7 (L) 03/23/2018   A1GS 0.3 03/23/2018   A2GS 0.8 03/23/2018   BETS 0.6 (L) 03/23/2018   BETA2SER 0.3 04/19/2015   GAMS 0.4 03/23/2018   MSPIKE 0.1 (H) 03/23/2018   SPEI Comment 12/05/2017     Chemistry      Component Value Date/Time   NA 138 05/04/2018 1012   NA 142 04/23/2017 1431   NA 139 07/18/2016 1135   K 4.2 05/04/2018 1012   K 4.0 04/23/2017 1431   K 4.4 07/18/2016 1135   CL 106 05/04/2018 1012     CL 105 04/23/2017 1431   CO2 24 05/04/2018 1012   CO2 28 04/23/2017 1431   CO2 27 07/18/2016 1135   BUN 17 05/04/2018 1012   BUN 20 04/23/2017 1431   BUN 12.2 07/18/2016 1135   CREATININE 0.78 05/04/2018 1012   CREATININE 1.2 04/23/2017 1431   CREATININE 0.9 07/18/2016 1135      Component Value Date/Time   CALCIUM 8.0 (L) 05/04/2018 1012   CALCIUM 9.3 04/23/2017 1431   CALCIUM 8.9 07/18/2016 1135   ALKPHOS 43 05/04/2018 1012   ALKPHOS 64 04/23/2017 1431   ALKPHOS 58 07/18/2016 1135  AST 12 (L) May 20, 2018 1012   AST 15 07/18/2016 1135   ALT 15 2018-05-20 1012   ALT 22 04/23/2017 1431   ALT 20 07/18/2016 1135   BILITOT 1.0 2018-05-20 1012   BILITOT 1.24 (H) 07/18/2016 1135      Impression and Plan: Rickey [name] is a very pleasant 77 yo caucasian gentleman with 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   Hopefully, he will be able to tolerate the Kyprolis.  The pomalidomide is added to the Kyprolis.  If we do not see a good response with the Kyprolis/Pomalidomide combination, then we will switch out the pomalidomide and try Cytoxan.  We still can use CAR-T therapy if necessary.  He will get his next 3 treatments in our office and then he will have treatments down along the [facility] at the [facility] in [place].  He really likes the doctor there.    I am not yet forgotten about the possibility of doing a bone marrow biopsy on him.  Again, this might be some that could be helpful.  A lot will depend upon his myeloma studies.     [name], MD 12/25/196:25 PM

## 2018-05-04 NOTE — Patient Instructions (Signed)
Carfilzomib injection What is this medicine? CARFILZOMIB (kar FILZ oh mib) targets a specific protein within cancer cells and stops the cancer cells from growing. It is used to treat multiple myeloma. This medicine may be used for other purposes; ask your health care provider or pharmacist if you have questions. COMMON BRAND NAME(S): KYPROLIS What should I tell my health care provider before I take this medicine? They need to know if you have any of these conditions: -heart disease -history of blood clots -irregular heartbeat -kidney disease -liver disease -lung or breathing disease -an unusual or allergic reaction to carfilzomib, or other medicines, foods, dyes, or preservatives -pregnant or trying to get pregnant -breast-feeding How should I use this medicine? This medicine is for injection or infusion into a vein. It is given by a health care professional in a hospital or clinic setting. Talk to your pediatrician regarding the use of this medicine in children. Special care may be needed. Overdosage: If you think you have taken too much of this medicine contact a poison control center or emergency room at once. NOTE: This medicine is only for you. Do not share this medicine with others. What if I miss a dose? It is important not to miss your dose. Call your doctor or health care professional if you are unable to keep an appointment. What may interact with this medicine? Interactions are not expected. Give your health care provider a list of all the medicines, herbs, non-prescription drugs, or dietary supplements you use. Also tell them if you smoke, drink alcohol, or use illegal drugs. Some items may interact with your medicine. This list may not describe all possible interactions. Give your health care provider a list of all the medicines, herbs, non-prescription drugs, or dietary supplements you use. Also tell them if you smoke, drink alcohol, or use illegal drugs. Some items may  interact with your medicine. What should I watch for while using this medicine? Your condition will be monitored carefully while you are receiving this medicine. Report any side effects. Continue your course of treatment even though you feel ill unless your doctor tells you to stop. You may need blood work done while you are taking this medicine. Do not become pregnant while taking this medicine or for at least 30 days after stopping it. Women should inform their doctor if they wish to become pregnant or think they might be pregnant. There is a potential for serious side effects to an unborn child. Men should not father a child while taking this medicine and for 90 days after stopping it. Talk to your health care professional or pharmacist for more information. Do not breast-feed an infant while taking this medicine. Check with your doctor or health care professional if you get an attack of severe diarrhea, nausea and vomiting, or if you sweat a lot. The loss of too much body fluid can make it dangerous for you to take this medicine. You may get dizzy. Do not drive, use machinery, or do anything that needs mental alertness until you know how this medicine affects you. Do not stand or sit up quickly, especially if you are an older patient. This reduces the risk of dizzy or fainting spells. What side effects may I notice from receiving this medicine? Side effects that you should report to your doctor or health care professional as soon as possible: -allergic reactions like skin rash, itching or hives, swelling of the face, lips, or tongue -confusion -dizziness -feeling faint or lightheaded -fever or chills -  palpitations -seizures -signs and symptoms of bleeding such as bloody or black, tarry stools; red or dark-brown urine; spitting up blood or brown material that looks like coffee grounds; red spots on the skin; unusual bruising or bleeding including from the eye, gums, or nose -signs and symptoms of  a blood clot such as breathing problems; changes in vision; chest pain; severe, sudden headache; pain, swelling, warmth in the leg; trouble speaking; sudden numbness or weakness of the face, arm or leg -signs and symptoms of kidney injury like trouble passing urine or change in the amount of urine -signs and symptoms of liver injury like dark yellow or brown urine; general ill feeling or flu-like symptoms; light-colored stools; loss of appetite; nausea; right upper belly pain; unusually weak or tired; yellowing of the eyes or skin Side effects that usually do not require medical attention (report to your doctor or health care professional if they continue or are bothersome): -back pain -cough -diarrhea -headache -muscle cramps -vomiting This list may not describe all possible side effects. Call your doctor for medical advice about side effects. You may report side effects to FDA at 1-800-FDA-1088. Where should I keep my medicine? This drug is given in a hospital or clinic and will not be stored at home. NOTE: This sheet is a summary. It may not cover all possible information. If you have questions about this medicine, talk to your doctor, pharmacist, or health care provider.  2018 Elsevier/Gold Standard (2015-06-15 13:39:23)  

## 2018-05-05 LAB — IGG, IGA, IGM
IgA: 24 mg/dL — ABNORMAL LOW (ref 61–437)
IgG (Immunoglobin G), Serum: 336 mg/dL — ABNORMAL LOW (ref 700–1600)
IgM (Immunoglobulin M), Srm: 7 mg/dL — ABNORMAL LOW (ref 15–143)

## 2018-05-05 LAB — KAPPA/LAMBDA LIGHT CHAINS
Kappa free light chain: 43.3 mg/L — ABNORMAL HIGH (ref 3.3–19.4)
Kappa, lambda light chain ratio: 18.04 — ABNORMAL HIGH (ref 0.26–1.65)
Lambda free light chains: 2.4 mg/L — ABNORMAL LOW (ref 5.7–26.3)

## 2018-05-06 ENCOUNTER — Telehealth: Payer: Self-pay | Admitting: *Deleted

## 2018-05-06 LAB — MULTIPLE MYELOMA PANEL, SERUM
Albumin SerPl Elph-Mcnc: 3.2 g/dL (ref 2.9–4.4)
Albumin/Glob SerPl: 1.8 — ABNORMAL HIGH (ref 0.7–1.7)
Alpha 1: 0.2 g/dL (ref 0.0–0.4)
Alpha2 Glob SerPl Elph-Mcnc: 0.7 g/dL (ref 0.4–1.0)
B-Globulin SerPl Elph-Mcnc: 0.6 g/dL — ABNORMAL LOW (ref 0.7–1.3)
Gamma Glob SerPl Elph-Mcnc: 0.3 g/dL — ABNORMAL LOW (ref 0.4–1.8)
Globulin, Total: 1.8 g/dL — ABNORMAL LOW (ref 2.2–3.9)
IgA: 25 mg/dL — ABNORMAL LOW (ref 61–437)
IgG (Immunoglobin G), Serum: 338 mg/dL — ABNORMAL LOW (ref 700–1600)
IgM (Immunoglobulin M), Srm: 7 mg/dL — ABNORMAL LOW (ref 15–143)
TOTAL PROTEIN ELP: 5 g/dL — AB (ref 6.0–8.5)

## 2018-05-06 NOTE — Telephone Encounter (Addendum)
-----   Message from [name], MD sent at 05/06/2018  7:03 AM EST ----- Called patient to let him know that his light chain went from 80 down to 72!!!  Great job!!

## 2018-05-07 DIAGNOSIS — C9000 Multiple myeloma not having achieved remission: Secondary | ICD-10-CM | POA: Diagnosis not present

## 2018-05-08 ENCOUNTER — Other Ambulatory Visit: Payer: Self-pay | Admitting: *Deleted

## 2018-05-08 DIAGNOSIS — C9000 Multiple myeloma not having achieved remission: Secondary | ICD-10-CM

## 2018-05-11 ENCOUNTER — Other Ambulatory Visit: Payer: Medicare Other

## 2018-05-11 ENCOUNTER — Inpatient Hospital Stay: Payer: Medicare Other

## 2018-05-11 ENCOUNTER — Ambulatory Visit: Payer: Medicare Other

## 2018-05-11 DIAGNOSIS — C9002 Multiple myeloma in relapse: Secondary | ICD-10-CM | POA: Diagnosis not present

## 2018-05-11 DIAGNOSIS — C9000 Multiple myeloma not having achieved remission: Secondary | ICD-10-CM

## 2018-05-11 DIAGNOSIS — Z5112 Encounter for antineoplastic immunotherapy: Secondary | ICD-10-CM | POA: Diagnosis not present

## 2018-05-11 LAB — CBC WITH DIFFERENTIAL (CANCER CENTER ONLY)
Abs Immature Granulocytes: 0.09 10*3/uL — ABNORMAL HIGH (ref 0.00–0.07)
Basophils Absolute: 0.1 10*3/uL (ref 0.0–0.1)
Basophils Relative: 1 %
Eosinophils Absolute: 0.2 10*3/uL (ref 0.0–0.5)
Eosinophils Relative: 5 %
HCT: 44.2 % (ref 39.0–52.0)
Hemoglobin: 14.3 g/dL (ref 13.0–17.0)
Immature Granulocytes: 2 %
LYMPHS PCT: 22 %
Lymphs Abs: 1 10*3/uL (ref 0.7–4.0)
MCH: 32.8 pg (ref 26.0–34.0)
MCHC: 32.4 g/dL (ref 30.0–36.0)
MCV: 101.4 fL — AB (ref 80.0–100.0)
Monocytes Absolute: 0.8 10*3/uL (ref 0.1–1.0)
Monocytes Relative: 19 %
Neutro Abs: 2.2 10*3/uL (ref 1.7–7.7)
Neutrophils Relative %: 51 %
Platelet Count: 100 10*3/uL — ABNORMAL LOW (ref 150–400)
RBC: 4.36 MIL/uL (ref 4.22–5.81)
RDW: 14.8 % (ref 11.5–15.5)
WBC Count: 4.3 10*3/uL (ref 4.0–10.5)
nRBC: 0 % (ref 0.0–0.2)

## 2018-05-11 LAB — COMPREHENSIVE METABOLIC PANEL
ALT: 18 U/L (ref 0–44)
AST: 18 U/L (ref 15–41)
Albumin: 3.2 g/dL — ABNORMAL LOW (ref 3.5–5.0)
Alkaline Phosphatase: 37 U/L — ABNORMAL LOW (ref 38–126)
Anion gap: 6 (ref 5–15)
BUN: 15 mg/dL (ref 8–23)
CO2: 25 mmol/L (ref 22–32)
CREATININE: 0.73 mg/dL (ref 0.61–1.24)
Calcium: 8.3 mg/dL — ABNORMAL LOW (ref 8.9–10.3)
Chloride: 106 mmol/L (ref 98–111)
GFR calc Af Amer: >60 mL/min (ref >60)
GFR calc non Af Amer: >60 mL/min (ref >60)
Glucose, Bld: 130 mg/dL — ABNORMAL HIGH (ref 70–99)
Potassium: 3.8 mmol/L (ref 3.5–5.1)
Sodium: 137 mmol/L (ref 135–145)
Total Bilirubin: 0.9 mg/dL (ref 0.3–1.2)
Total Protein: 5.4 g/dL — ABNORMAL LOW (ref 6.5–8.1)

## 2018-05-11 MED ORDER — DEXTROSE 5 % IV SOLN
71.0000 mg/m2 | Freq: Once | INTRAVENOUS | Status: AC
  Administered 2018-05-11: 150 mg via INTRAVENOUS
  Filled 2018-05-11: qty 60

## 2018-05-11 MED ORDER — SODIUM CHLORIDE 0.9% FLUSH
10.0000 mL | INTRAVENOUS | Status: DC | PRN
  Administered 2018-05-11: 10 mL
  Filled 2018-05-11: qty 10

## 2018-05-11 MED ORDER — ACETAMINOPHEN 325 MG PO TABS
ORAL_TABLET | ORAL | Status: AC
  Filled 2018-05-11: qty 2

## 2018-05-11 MED ORDER — SODIUM CHLORIDE 0.9 % IV SOLN
20.0000 mg | Freq: Once | INTRAVENOUS | Status: AC
  Administered 2018-05-11: 20 mg via INTRAVENOUS
  Filled 2018-05-11: qty 2

## 2018-05-11 MED ORDER — ACETAMINOPHEN 325 MG PO TABS
650.0000 mg | ORAL_TABLET | Freq: Once | ORAL | Status: AC
  Administered 2018-05-11: 650 mg via ORAL

## 2018-05-11 MED ORDER — SODIUM CHLORIDE 0.9 % IV SOLN
Freq: Once | INTRAVENOUS | Status: DC
  Filled 2018-05-11: qty 250

## 2018-05-11 MED ORDER — PROCHLORPERAZINE MALEATE 10 MG PO TABS
10.0000 mg | ORAL_TABLET | Freq: Once | ORAL | Status: AC
  Administered 2018-05-11: 10 mg via ORAL

## 2018-05-11 MED ORDER — HEPARIN SOD (PORK) LOCK FLUSH 100 UNIT/ML IV SOLN
500.0000 [IU] | Freq: Once | INTRAVENOUS | Status: AC | PRN
  Administered 2018-05-11: 500 [IU]
  Filled 2018-05-11: qty 5

## 2018-05-11 MED ORDER — PROCHLORPERAZINE MALEATE 10 MG PO TABS
ORAL_TABLET | ORAL | Status: AC
  Filled 2018-05-11: qty 1

## 2018-05-11 MED ORDER — DIPHENHYDRAMINE HCL 25 MG PO CAPS
50.0000 mg | ORAL_CAPSULE | Freq: Once | ORAL | Status: AC
  Administered 2018-05-11: 50 mg via ORAL

## 2018-05-11 MED ORDER — DIPHENHYDRAMINE HCL 25 MG PO CAPS
ORAL_CAPSULE | ORAL | Status: AC
  Filled 2018-05-11: qty 2

## 2018-05-11 MED ORDER — SODIUM CHLORIDE 0.9 % IV SOLN
Freq: Once | INTRAVENOUS | Status: AC
  Administered 2018-05-11: 12:00:00 via INTRAVENOUS
  Filled 2018-05-11: qty 250

## 2018-05-11 NOTE — Patient Instructions (Signed)
[facility] Discharge Instructions for Patients Receiving Chemotherapy  Today you received the following chemotherapy agents [place]yr[place]is.  To help prevent nausea and vomiting after your treatment, we encourage you to take your nausea medication as directed.   If you develop nausea and vomiting that is not controlled by your nausea medication, call the clinic.   BELOW ARE SYMPTOMS THAT SHOULD BE REPORTED IMMEDIATELY:  *FEVER GREATER THAN 100.5 F  *CHILLS WITH OR WITHOUT FEVER  NAUSEA AND VOMITING THAT IS NOT CONTROLLED WITH YOUR NAUSEA MEDICATION  *UNUSUAL SHORTNESS OF BREATH  *UNUSUAL BRUISING OR BLEEDING  TENDERNESS IN MOUTH AND THROAT WITH OR WITHOUT PRESENCE OF ULCERS  *URINARY PROBLEMS  *BOWEL PROBLEMS  UNUSUAL RASH Items with * indicate a potential emergency and should be followed up as soon as possible.  Feel free to call the clinic you have any questions or concerns. The clinic phone number is [phone].  Please show the CHEMO ALERT CARD at check-in to the Emergency Department and triage nurse.    

## 2018-05-11 NOTE — Patient Instructions (Signed)
Implanted Port Home Guide An implanted port is a type of central line that is placed under the skin. Central lines are used to provide IV access when treatment or nutrition needs to be given through a person's veins. Implanted ports are used for long-term IV access. An implanted port may be placed because:  You need IV medicine that would be irritating to the small veins in your hands or arms.  You need long-term IV medicines, such as antibiotics.  You need IV nutrition for a long period.  You need frequent blood draws for lab tests.  You need dialysis.  Implanted ports are usually placed in the chest area, but they can also be placed in the upper arm, the abdomen, or the leg. An implanted port has two main parts:  Reservoir. The reservoir is round and will appear as a small, raised area under your skin. The reservoir is the part where a needle is inserted to give medicines or draw blood.  Catheter. The catheter is a thin, flexible tube that extends from the reservoir. The catheter is placed into a large vein. Medicine that is inserted into the reservoir goes into the catheter and then into the vein.  How will I care for my incision site? Do not get the incision site wet. Bathe or shower as directed by your health care provider. How is my port accessed? Special steps must be taken to access the port:  Before the port is accessed, a numbing cream can be placed on the skin. This helps numb the skin over the port site.  Your health care provider uses a sterile technique to access the port. ? Your health care provider must put on a mask and sterile gloves. ? The skin over your port is cleaned carefully with an antiseptic and allowed to dry. ? The port is gently pinched between sterile gloves, and a needle is inserted into the port.  Only "non-coring" port needles should be used to access the port. Once the port is accessed, a blood return should be checked. This helps ensure that the port  is in the vein and is not clogged.  If your port needs to remain accessed for a constant infusion, a clear (transparent) bandage will be placed over the needle site. The bandage and needle will need to be changed every week, or as directed by your health care provider.  Keep the bandage covering the needle clean and dry. Do not get it wet. Follow your health care provider's instructions on how to take a shower or bath while the port is accessed.  If your port does not need to stay accessed, no bandage is needed over the port.  What is flushing? Flushing helps keep the port from getting clogged. Follow your health care provider's instructions on how and when to flush the port. Ports are usually flushed with saline solution or a medicine called heparin. The need for flushing will depend on how the port is used.  If the port is used for intermittent medicines or blood draws, the port will need to be flushed: ? After medicines have been given. ? After blood has been drawn. ? As part of routine maintenance.  If a constant infusion is running, the port may not need to be flushed.  How long will my port stay implanted? The port can stay in for as long as your health care provider thinks it is needed. When it is time for the port to come out, surgery will be   done to remove it. The procedure is similar to the one performed when the port was put in. When should I seek immediate medical care? When you have an implanted port, you should seek immediate medical care if:  You notice a bad smell coming from the incision site.  You have swelling, redness, or drainage at the incision site.  You have more swelling or pain at the port site or the surrounding area.  You have a fever that is not controlled with medicine.  This information is not intended to replace advice given to you by your health care provider. Make sure you discuss any questions you have with your health care provider. Document  Released: 05/13/2005 Document Revised: 10/19/2015 Document Reviewed: 01/18/2013 Elsevier Interactive Patient Education  2017 [employer] Inc[employer]  

## 2018-05-15 ENCOUNTER — Other Ambulatory Visit: Payer: Self-pay | Admitting: *Deleted

## 2018-05-15 DIAGNOSIS — C9000 Multiple myeloma not having achieved remission: Secondary | ICD-10-CM

## 2018-05-15 DIAGNOSIS — S91332A Puncture wound without foreign body, left foot, initial encounter: Secondary | ICD-10-CM

## 2018-05-15 MED ORDER — POMALIDOMIDE 4 MG PO CAPS: 4.0000 mg | ORAL_CAPSULE | Freq: Every day | ORAL | 0 refills | Status: DC

## 2018-05-18 ENCOUNTER — Inpatient Hospital Stay: Payer: Medicare Other

## 2018-05-18 ENCOUNTER — Other Ambulatory Visit: Payer: Medicare Other

## 2018-05-18 ENCOUNTER — Other Ambulatory Visit: Payer: Self-pay

## 2018-05-18 ENCOUNTER — Ambulatory Visit: Payer: Medicare Other

## 2018-05-18 DIAGNOSIS — C9000 Multiple myeloma not having achieved remission: Secondary | ICD-10-CM

## 2018-05-18 DIAGNOSIS — C9002 Multiple myeloma in relapse: Secondary | ICD-10-CM | POA: Diagnosis not present

## 2018-05-18 DIAGNOSIS — Z5112 Encounter for antineoplastic immunotherapy: Secondary | ICD-10-CM | POA: Diagnosis not present

## 2018-05-18 LAB — CBC WITH DIFFERENTIAL (CANCER CENTER ONLY)
Abs Immature Granulocytes: 0.07 10*3/uL (ref 0.00–0.07)
Basophils Absolute: 0.1 10*3/uL (ref 0.0–0.1)
Basophils Relative: 1 %
Eosinophils Absolute: 0.3 10*3/uL (ref 0.0–0.5)
Eosinophils Relative: 5 %
HCT: 44.9 % (ref 39.0–52.0)
Hemoglobin: 14.5 g/dL (ref 13.0–17.0)
Immature Granulocytes: 1 %
LYMPHS ABS: 1.3 10*3/uL (ref 0.7–4.0)
Lymphocytes Relative: 19 %
MCH: 32.7 pg (ref 26.0–34.0)
MCHC: 32.3 g/dL (ref 30.0–36.0)
MCV: 101.1 fL — ABNORMAL HIGH (ref 80.0–100.0)
MONO ABS: 1.4 10*3/uL — AB (ref 0.1–1.0)
MONOS PCT: 21 %
Neutro Abs: 3.4 10*3/uL (ref 1.7–7.7)
Neutrophils Relative %: 53 %
Platelet Count: 122 10*3/uL — ABNORMAL LOW (ref 150–400)
RBC: 4.44 MIL/uL (ref 4.22–5.81)
RDW: 15 % (ref 11.5–15.5)
WBC Count: 6.5 10*3/uL (ref 4.0–10.5)
nRBC: 0 % (ref 0.0–0.2)

## 2018-05-18 LAB — CMP (CANCER CENTER ONLY)
ALT: 16 U/L (ref 0–44)
AST: 11 U/L — ABNORMAL LOW (ref 15–41)
Albumin: 3.5 g/dL (ref 3.5–5.0)
Alkaline Phosphatase: 43 U/L (ref 38–126)
Anion gap: 7 (ref 5–15)
BUN: 17 mg/dL (ref 8–23)
CO2: 25 mmol/L (ref 22–32)
Calcium: 8.2 mg/dL — ABNORMAL LOW (ref 8.9–10.3)
Chloride: 105 mmol/L (ref 98–111)
Creatinine: 0.8 mg/dL (ref 0.61–1.24)
GFR, Est AFR Am: >60 mL/min (ref >60)
GFR, Estimated: >60 mL/min (ref >60)
GLUCOSE: 250 mg/dL — AB (ref 70–99)
Potassium: 3.9 mmol/L (ref 3.5–5.1)
Sodium: 137 mmol/L (ref 135–145)
Total Bilirubin: 1.1 mg/dL (ref 0.3–1.2)
Total Protein: 4.7 g/dL — ABNORMAL LOW (ref 6.5–8.1)

## 2018-05-18 MED ORDER — PROCHLORPERAZINE MALEATE 10 MG PO TABS
ORAL_TABLET | ORAL | Status: AC
  Filled 2018-05-18: qty 1

## 2018-05-18 MED ORDER — ACETAMINOPHEN 325 MG PO TABS
ORAL_TABLET | ORAL | Status: AC
  Filled 2018-05-18: qty 2

## 2018-05-18 MED ORDER — SODIUM CHLORIDE 0.9% FLUSH
3.0000 mL | INTRAVENOUS | Status: DC | PRN
  Filled 2018-05-18: qty 10

## 2018-05-18 MED ORDER — SODIUM CHLORIDE 0.9 % IV SOLN
Freq: Once | INTRAVENOUS | Status: DC
  Filled 2018-05-18: qty 250

## 2018-05-18 MED ORDER — HEPARIN SOD (PORK) LOCK FLUSH 100 UNIT/ML IV SOLN
500.0000 [IU] | Freq: Once | INTRAVENOUS | Status: AC | PRN
  Administered 2018-05-18: 500 [IU]
  Filled 2018-05-18: qty 5

## 2018-05-18 MED ORDER — DIPHENHYDRAMINE HCL 25 MG PO CAPS
ORAL_CAPSULE | ORAL | Status: AC
  Filled 2018-05-18: qty 2

## 2018-05-18 MED ORDER — DEXTROSE 5 % IV SOLN
71.0000 mg/m2 | Freq: Once | INTRAVENOUS | Status: AC
  Administered 2018-05-18: 150 mg via INTRAVENOUS
  Filled 2018-05-18: qty 60

## 2018-05-18 MED ORDER — HEPARIN SOD (PORK) LOCK FLUSH 100 UNIT/ML IV SOLN
250.0000 [IU] | Freq: Once | INTRAVENOUS | Status: DC | PRN
  Filled 2018-05-18: qty 5

## 2018-05-18 MED ORDER — ACETAMINOPHEN 325 MG PO TABS
650.0000 mg | ORAL_TABLET | Freq: Once | ORAL | Status: AC
  Administered 2018-05-18: 650 mg via ORAL

## 2018-05-18 MED ORDER — SODIUM CHLORIDE 0.9% FLUSH
10.0000 mL | INTRAVENOUS | Status: DC | PRN
  Administered 2018-05-18: 10 mL
  Filled 2018-05-18: qty 10

## 2018-05-18 MED ORDER — DIPHENHYDRAMINE HCL 25 MG PO CAPS
50.0000 mg | ORAL_CAPSULE | Freq: Once | ORAL | Status: AC
  Administered 2018-05-18: 50 mg via ORAL

## 2018-05-18 MED ORDER — SODIUM CHLORIDE 0.9 % IV SOLN
20.0000 mg | Freq: Once | INTRAVENOUS | Status: AC
  Administered 2018-05-18: 20 mg via INTRAVENOUS
  Filled 2018-05-18: qty 2

## 2018-05-18 MED ORDER — ALTEPLASE 2 MG IJ SOLR
2.0000 mg | Freq: Once | INTRAMUSCULAR | Status: DC | PRN
  Filled 2018-05-18: qty 2

## 2018-05-18 MED ORDER — PROCHLORPERAZINE MALEATE 10 MG PO TABS
10.0000 mg | ORAL_TABLET | Freq: Once | ORAL | Status: AC
  Administered 2018-05-18: 10 mg via ORAL

## 2018-05-18 MED ORDER — SODIUM CHLORIDE 0.9 % IV SOLN
Freq: Once | INTRAVENOUS | Status: AC
  Administered 2018-05-18: 12:00:00 via INTRAVENOUS
  Filled 2018-05-18: qty 250

## 2018-05-18 NOTE — Patient Instructions (Signed)

## 2018-05-18 NOTE — Patient Instructions (Signed)
Carfilzomib injection What is this medicine? CARFILZOMIB (kar FILZ oh mib) targets a specific protein within cancer cells and stops the cancer cells from growing. It is used to treat multiple myeloma. This medicine may be used for other purposes; ask your health care provider or pharmacist if you have questions. COMMON BRAND NAME(S): KYPROLIS What should I tell my health care provider before I take this medicine? They need to know if you have any of these conditions: -heart disease -history of blood clots -irregular heartbeat -kidney disease -liver disease -lung or breathing disease -an unusual or allergic reaction to carfilzomib, or other medicines, foods, dyes, or preservatives -pregnant or trying to get pregnant -breast-feeding How should I use this medicine? This medicine is for injection or infusion into a vein. It is given by a health care professional in a hospital or clinic setting. Talk to your pediatrician regarding the use of this medicine in children. Special care may be needed. Overdosage: If you think you have taken too much of this medicine contact a poison control center or emergency room at once. NOTE: This medicine is only for you. Do not share this medicine with others. What if I miss a dose? It is important not to miss your dose. Call your doctor or health care professional if you are unable to keep an appointment. What may interact with this medicine? Interactions are not expected. Give your health care provider a list of all the medicines, herbs, non-prescription drugs, or dietary supplements you use. Also tell them if you smoke, drink alcohol, or use illegal drugs. Some items may interact with your medicine. This list may not describe all possible interactions. Give your health care provider a list of all the medicines, herbs, non-prescription drugs, or dietary supplements you use. Also tell them if you smoke, drink alcohol, or use illegal drugs. Some items may  interact with your medicine. What should I watch for while using this medicine? Your condition will be monitored carefully while you are receiving this medicine. Report any side effects. Continue your course of treatment even though you feel ill unless your doctor tells you to stop. You may need blood work done while you are taking this medicine. Do not become pregnant while taking this medicine or for at least 6 months after stopping it. Women should inform their doctor if they wish to become pregnant or think they might be pregnant. There is a potential for serious side effects to an unborn child. Men should not father a child while taking this medicine and for at least 3 months after stopping it. Talk to your health care professional or pharmacist for more information. Do not breast-feed an infant while taking this medicine or for 2 weeks after the last dose. Check with your doctor or health care professional if you get an attack of severe diarrhea, nausea and vomiting, or if you sweat a lot. The loss of too much body fluid can make it dangerous for you to take this medicine. You may get dizzy. Do not drive, use machinery, or do anything that needs mental alertness until you know how this medicine affects you. Do not stand or sit up quickly, especially if you are an older patient. This reduces the risk of dizzy or fainting spells. What side effects may I notice from receiving this medicine? Side effects that you should report to your doctor or health care professional as soon as possible: -allergic reactions like skin rash, itching or hives, swelling of the face, lips, or  tongue -confusion -dizziness -feeling faint or lightheaded -fever or chills -palpitations -seizures -signs and symptoms of bleeding such as bloody or black, tarry stools; red or dark-brown urine; spitting up blood or brown material that looks like coffee grounds; red spots on the skin; unusual bruising or bleeding including from  the eye, gums, or nose -signs and symptoms of a blood clot such as breathing problems; changes in vision; chest pain; severe, sudden headache; pain, swelling, warmth in the leg; trouble speaking; sudden numbness or weakness of the face, arm or leg -signs and symptoms of kidney injury like trouble passing urine or change in the amount of urine -signs and symptoms of liver injury like dark yellow or brown urine; general ill feeling or flu-like symptoms; light-colored stools; loss of appetite; nausea; right upper belly pain; unusually weak or tired; yellowing of the eyes or skin Side effects that usually do not require medical attention (report to your doctor or health care professional if they continue or are bothersome): -back pain -cough -diarrhea -headache -muscle cramps -vomiting This list may not describe all possible side effects. Call your doctor for medical advice about side effects. You may report side effects to FDA at 1-800-FDA-1088. Where should I keep my medicine? This drug is given in a hospital or clinic and will not be stored at home. NOTE: This sheet is a summary. It may not cover all possible information. If you have questions about this medicine, talk to your doctor, pharmacist, or health care provider.  2019 Elsevier/Gold Standard (2017-02-26 14:07:13)

## 2018-06-01 DIAGNOSIS — C9000 Multiple myeloma not having achieved remission: Secondary | ICD-10-CM | POA: Diagnosis not present

## 2018-06-08 DIAGNOSIS — Z125 Encounter for screening for malignant neoplasm of prostate: Secondary | ICD-10-CM | POA: Diagnosis not present

## 2018-06-08 DIAGNOSIS — E109 Type 1 diabetes mellitus without complications: Secondary | ICD-10-CM | POA: Diagnosis not present

## 2018-06-08 DIAGNOSIS — E291 Testicular hypofunction: Secondary | ICD-10-CM | POA: Diagnosis not present

## 2018-06-08 DIAGNOSIS — C9000 Multiple myeloma not having achieved remission: Secondary | ICD-10-CM | POA: Diagnosis not present

## 2018-06-12 ENCOUNTER — Other Ambulatory Visit: Payer: Self-pay | Admitting: *Deleted

## 2018-06-12 DIAGNOSIS — S91332A Puncture wound without foreign body, left foot, initial encounter: Secondary | ICD-10-CM

## 2018-06-12 DIAGNOSIS — C9000 Multiple myeloma not having achieved remission: Secondary | ICD-10-CM

## 2018-06-12 MED ORDER — POMALIDOMIDE 4 MG PO CAPS: 4.0000 mg | ORAL_CAPSULE | Freq: Every day | ORAL | 0 refills | Status: DC

## 2018-06-15 DIAGNOSIS — E291 Testicular hypofunction: Secondary | ICD-10-CM | POA: Diagnosis not present

## 2018-06-15 DIAGNOSIS — E109 Type 1 diabetes mellitus without complications: Secondary | ICD-10-CM | POA: Diagnosis not present

## 2018-06-15 DIAGNOSIS — C9000 Multiple myeloma not having achieved remission: Secondary | ICD-10-CM | POA: Diagnosis not present

## 2018-06-15 DIAGNOSIS — Z125 Encounter for screening for malignant neoplasm of prostate: Secondary | ICD-10-CM | POA: Diagnosis not present

## 2018-06-16 DIAGNOSIS — Z0000 Encounter for general adult medical examination without abnormal findings: Secondary | ICD-10-CM | POA: Diagnosis not present

## 2018-06-16 DIAGNOSIS — Z79899 Other long term (current) drug therapy: Secondary | ICD-10-CM | POA: Diagnosis not present

## 2018-06-22 DIAGNOSIS — J302 Other seasonal allergic rhinitis: Secondary | ICD-10-CM | POA: Diagnosis not present

## 2018-06-22 DIAGNOSIS — Z1331 Encounter for screening for depression: Secondary | ICD-10-CM | POA: Diagnosis not present

## 2018-06-22 DIAGNOSIS — C9000 Multiple myeloma not having achieved remission: Secondary | ICD-10-CM | POA: Diagnosis not present

## 2018-06-22 DIAGNOSIS — N400 Enlarged prostate without lower urinary tract symptoms: Secondary | ICD-10-CM | POA: Diagnosis not present

## 2018-06-22 DIAGNOSIS — E291 Testicular hypofunction: Secondary | ICD-10-CM | POA: Diagnosis not present

## 2018-06-22 DIAGNOSIS — I4510 Unspecified right bundle-branch block: Secondary | ICD-10-CM | POA: Diagnosis not present

## 2018-06-22 DIAGNOSIS — Z0000 Encounter for general adult medical examination without abnormal findings: Secondary | ICD-10-CM | POA: Diagnosis not present

## 2018-06-22 DIAGNOSIS — I10 Essential (primary) hypertension: Secondary | ICD-10-CM | POA: Diagnosis not present

## 2018-06-22 DIAGNOSIS — E109 Type 1 diabetes mellitus without complications: Secondary | ICD-10-CM | POA: Diagnosis not present

## 2018-06-22 DIAGNOSIS — E7849 Other hyperlipidemia: Secondary | ICD-10-CM | POA: Diagnosis not present

## 2018-06-22 DIAGNOSIS — Z6826 Body mass index (BMI) 26.0-26.9, adult: Secondary | ICD-10-CM | POA: Diagnosis not present

## 2018-06-29 DIAGNOSIS — C9000 Multiple myeloma not having achieved remission: Secondary | ICD-10-CM | POA: Diagnosis not present

## 2018-07-06 DIAGNOSIS — C9000 Multiple myeloma not having achieved remission: Secondary | ICD-10-CM | POA: Diagnosis not present

## 2018-07-07 ENCOUNTER — Other Ambulatory Visit: Payer: Self-pay

## 2018-07-13 DIAGNOSIS — C9000 Multiple myeloma not having achieved remission: Secondary | ICD-10-CM | POA: Diagnosis not present

## 2018-07-21 ENCOUNTER — Other Ambulatory Visit: Payer: Self-pay | Admitting: *Deleted

## 2018-07-21 DIAGNOSIS — S91332A Puncture wound without foreign body, left foot, initial encounter: Secondary | ICD-10-CM

## 2018-07-21 DIAGNOSIS — C9000 Multiple myeloma not having achieved remission: Secondary | ICD-10-CM

## 2018-07-21 MED ORDER — POMALIDOMIDE 4 MG PO CAPS: 4.0000 mg | ORAL_CAPSULE | Freq: Every day | ORAL | 0 refills | Status: DC

## 2018-07-27 DIAGNOSIS — C9000 Multiple myeloma not having achieved remission: Secondary | ICD-10-CM | POA: Diagnosis not present

## 2018-08-03 DIAGNOSIS — C9000 Multiple myeloma not having achieved remission: Secondary | ICD-10-CM | POA: Diagnosis not present

## 2018-08-06 ENCOUNTER — Other Ambulatory Visit: Payer: Self-pay | Admitting: *Deleted

## 2018-08-06 DIAGNOSIS — S91332A Puncture wound without foreign body, left foot, initial encounter: Secondary | ICD-10-CM

## 2018-08-06 DIAGNOSIS — C9000 Multiple myeloma not having achieved remission: Secondary | ICD-10-CM

## 2018-08-06 MED ORDER — POMALIDOMIDE 4 MG PO CAPS: 4.0000 mg | ORAL_CAPSULE | Freq: Every day | ORAL | 0 refills | Status: DC

## 2018-08-10 DIAGNOSIS — C9000 Multiple myeloma not having achieved remission: Secondary | ICD-10-CM | POA: Diagnosis not present

## 2018-08-20 ENCOUNTER — Telehealth: Payer: Self-pay | Admitting: *Deleted

## 2018-08-20 NOTE — Telephone Encounter (Signed)
Patient is currently receiving chemo at the [facility]. He is on his week off and wants to know what Dr Marin Olp think s about him taking off the next cycle to self isolate.  Reviewed with Dr Marin Olp. Dr Marin Olp does NOT think that this is a good idea and wants patient to remain on current treatment plan.  Notified patient of Dr Antonieta Pert recommendations. He did not seem on board and stated that he had heard rumors that a staff member at ths hospital was infected with COVID-19. I explained to him, that he was free to make whatever decision he felt comfortable with, but that Dr Marin Olp felt that he should continue treatment. He understood.

## 2018-08-31 DIAGNOSIS — C9000 Multiple myeloma not having achieved remission: Secondary | ICD-10-CM | POA: Diagnosis not present

## 2018-09-02 ENCOUNTER — Other Ambulatory Visit: Payer: Self-pay | Admitting: *Deleted

## 2018-09-02 DIAGNOSIS — S91332A Puncture wound without foreign body, left foot, initial encounter: Secondary | ICD-10-CM

## 2018-09-02 DIAGNOSIS — C9000 Multiple myeloma not having achieved remission: Secondary | ICD-10-CM

## 2018-09-02 MED ORDER — POMALIDOMIDE 4 MG PO CAPS: 4.0000 mg | ORAL_CAPSULE | Freq: Every day | ORAL | 0 refills | Status: DC

## 2018-09-09 DIAGNOSIS — C9000 Multiple myeloma not having achieved remission: Secondary | ICD-10-CM | POA: Diagnosis not present

## 2018-09-14 DIAGNOSIS — C9000 Multiple myeloma not having achieved remission: Secondary | ICD-10-CM | POA: Diagnosis not present

## 2018-09-22 ENCOUNTER — Telehealth: Payer: Self-pay | Admitting: Hematology & Oncology

## 2018-09-22 NOTE — Telephone Encounter (Signed)
Received call from pt to sch lab/visit.infusion on 6/1 due to being back in the area. Set appt for 1030 am per request.

## 2018-09-28 DIAGNOSIS — C9000 Multiple myeloma not having achieved remission: Secondary | ICD-10-CM | POA: Diagnosis not present

## 2018-09-30 DIAGNOSIS — E109 Type 1 diabetes mellitus without complications: Secondary | ICD-10-CM | POA: Diagnosis not present

## 2018-09-30 DIAGNOSIS — I10 Essential (primary) hypertension: Secondary | ICD-10-CM | POA: Diagnosis not present

## 2018-09-30 DIAGNOSIS — E785 Hyperlipidemia, unspecified: Secondary | ICD-10-CM | POA: Diagnosis not present

## 2018-09-30 DIAGNOSIS — N400 Enlarged prostate without lower urinary tract symptoms: Secondary | ICD-10-CM | POA: Diagnosis not present

## 2018-09-30 DIAGNOSIS — C9000 Multiple myeloma not having achieved remission: Secondary | ICD-10-CM | POA: Diagnosis not present

## 2018-10-05 DIAGNOSIS — C9000 Multiple myeloma not having achieved remission: Secondary | ICD-10-CM | POA: Diagnosis not present

## 2018-10-12 DIAGNOSIS — C9000 Multiple myeloma not having achieved remission: Secondary | ICD-10-CM | POA: Diagnosis not present

## 2018-10-14 ENCOUNTER — Other Ambulatory Visit: Payer: Self-pay | Admitting: *Deleted

## 2018-10-14 DIAGNOSIS — C9000 Multiple myeloma not having achieved remission: Secondary | ICD-10-CM

## 2018-10-14 DIAGNOSIS — S91332A Puncture wound without foreign body, left foot, initial encounter: Secondary | ICD-10-CM

## 2018-10-14 MED ORDER — POMALIDOMIDE 4 MG PO CAPS: 4.0000 mg | ORAL_CAPSULE | Freq: Every day | ORAL | 0 refills | Status: DC

## 2018-10-20 ENCOUNTER — Other Ambulatory Visit: Payer: Self-pay | Admitting: Hematology & Oncology

## 2018-10-20 DIAGNOSIS — C9000 Multiple myeloma not having achieved remission: Secondary | ICD-10-CM

## 2018-10-26 ENCOUNTER — Other Ambulatory Visit: Payer: Self-pay

## 2018-10-26 ENCOUNTER — Inpatient Hospital Stay: Payer: Medicare Other

## 2018-10-26 ENCOUNTER — Encounter: Payer: Self-pay | Admitting: Hematology & Oncology

## 2018-10-26 ENCOUNTER — Inpatient Hospital Stay: Payer: Medicare Other | Attending: Hematology & Oncology | Admitting: Hematology & Oncology

## 2018-10-26 VITALS — BP 128/71 | HR 67 | Temp 98.4°F | Resp 17 | Wt 205.0 lb

## 2018-10-26 DIAGNOSIS — C9000 Multiple myeloma not having achieved remission: Secondary | ICD-10-CM

## 2018-10-26 DIAGNOSIS — E119 Type 2 diabetes mellitus without complications: Secondary | ICD-10-CM | POA: Diagnosis not present

## 2018-10-26 DIAGNOSIS — Z5112 Encounter for antineoplastic immunotherapy: Secondary | ICD-10-CM | POA: Diagnosis not present

## 2018-10-26 DIAGNOSIS — C9002 Multiple myeloma in relapse: Secondary | ICD-10-CM | POA: Diagnosis not present

## 2018-10-26 LAB — LACTATE DEHYDROGENASE: LDH: 202 U/L — ABNORMAL HIGH (ref 98–192)

## 2018-10-26 LAB — CMP (CANCER CENTER ONLY)
ALT: 19 U/L (ref 0–44)
AST: 15 U/L (ref 15–41)
Albumin: 3.9 g/dL (ref 3.5–5.0)
Alkaline Phosphatase: 38 U/L (ref 38–126)
Anion gap: 7 (ref 5–15)
BUN: 20 mg/dL (ref 8–23)
CO2: 25 mmol/L (ref 22–32)
Calcium: 8.5 mg/dL — ABNORMAL LOW (ref 8.9–10.3)
Chloride: 106 mmol/L (ref 98–111)
Creatinine: 0.98 mg/dL (ref 0.61–1.24)
GFR, Est AFR Am: >60 mL/min (ref >60)
GFR, Estimated: >60 mL/min (ref >60)
Glucose, Bld: 147 mg/dL — ABNORMAL HIGH (ref 70–99)
Potassium: 4 mmol/L (ref 3.5–5.1)
Sodium: 138 mmol/L (ref 135–145)
Total Bilirubin: 1.3 mg/dL — ABNORMAL HIGH (ref 0.3–1.2)
Total Protein: 5.4 g/dL — ABNORMAL LOW (ref 6.5–8.1)

## 2018-10-26 LAB — CBC WITH DIFFERENTIAL (CANCER CENTER ONLY)
Abs Immature Granulocytes: 0.01 10*3/uL (ref 0.00–0.07)
Basophils Absolute: 0.1 10*3/uL (ref 0.0–0.1)
Basophils Relative: 3 %
Eosinophils Absolute: 0.1 10*3/uL (ref 0.0–0.5)
Eosinophils Relative: 3 %
HCT: 42.6 % (ref 39.0–52.0)
Hemoglobin: 14.4 g/dL (ref 13.0–17.0)
Immature Granulocytes: 0 %
Lymphocytes Relative: 35 %
Lymphs Abs: 1.4 10*3/uL (ref 0.7–4.0)
MCH: 35 pg — ABNORMAL HIGH (ref 26.0–34.0)
MCHC: 33.8 g/dL (ref 30.0–36.0)
MCV: 103.6 fL — ABNORMAL HIGH (ref 80.0–100.0)
Monocytes Absolute: 0.7 10*3/uL (ref 0.1–1.0)
Monocytes Relative: 17 %
Neutro Abs: 1.7 10*3/uL (ref 1.7–7.7)
Neutrophils Relative %: 42 %
Platelet Count: 165 10*3/uL (ref 150–400)
RBC: 4.11 MIL/uL — ABNORMAL LOW (ref 4.22–5.81)
RDW: 14.4 % (ref 11.5–15.5)
WBC Count: 4 10*3/uL (ref 4.0–10.5)
nRBC: 0 % (ref 0.0–0.2)

## 2018-10-26 LAB — SAMPLE TO BLOOD BANK

## 2018-10-26 LAB — SAVE SMEAR(SSMR), FOR PROVIDER SLIDE REVIEW

## 2018-10-26 MED ORDER — SODIUM CHLORIDE 0.9 % IV SOLN
Freq: Once | INTRAVENOUS | Status: DC
  Filled 2018-10-26: qty 250

## 2018-10-26 MED ORDER — PROCHLORPERAZINE MALEATE 10 MG PO TABS
10.0000 mg | ORAL_TABLET | Freq: Once | ORAL | Status: AC
  Administered 2018-10-26: 10 mg via ORAL

## 2018-10-26 MED ORDER — HEPARIN SOD (PORK) LOCK FLUSH 100 UNIT/ML IV SOLN
500.0000 [IU] | Freq: Once | INTRAVENOUS | Status: AC | PRN
  Administered 2018-10-26: 500 [IU]
  Filled 2018-10-26: qty 5

## 2018-10-26 MED ORDER — ACETAMINOPHEN 325 MG PO TABS
ORAL_TABLET | ORAL | Status: AC
  Filled 2018-10-26: qty 2

## 2018-10-26 MED ORDER — ACETAMINOPHEN 325 MG PO TABS
650.0000 mg | ORAL_TABLET | Freq: Once | ORAL | Status: AC
  Administered 2018-10-26: 650 mg via ORAL

## 2018-10-26 MED ORDER — DEXTROSE 5 % IV SOLN
71.0000 mg/m2 | Freq: Once | INTRAVENOUS | Status: AC
  Administered 2018-10-26: 150 mg via INTRAVENOUS
  Filled 2018-10-26: qty 60

## 2018-10-26 MED ORDER — DIPHENHYDRAMINE HCL 25 MG PO CAPS
50.0000 mg | ORAL_CAPSULE | Freq: Once | ORAL | Status: AC
  Administered 2018-10-26: 50 mg via ORAL

## 2018-10-26 MED ORDER — SODIUM CHLORIDE 0.9 % IV SOLN
Freq: Once | INTRAVENOUS | Status: AC
  Administered 2018-10-26: 12:00:00 via INTRAVENOUS
  Filled 2018-10-26: qty 250

## 2018-10-26 MED ORDER — PROCHLORPERAZINE MALEATE 10 MG PO TABS
ORAL_TABLET | ORAL | Status: AC
  Filled 2018-10-26: qty 1

## 2018-10-26 MED ORDER — SODIUM CHLORIDE 0.9% FLUSH
10.0000 mL | INTRAVENOUS | Status: DC | PRN
  Administered 2018-10-26: 10 mL
  Filled 2018-10-26: qty 10

## 2018-10-26 MED ORDER — SODIUM CHLORIDE 0.9 % IV SOLN
20.0000 mg | Freq: Once | INTRAVENOUS | Status: AC
  Administered 2018-10-26: 20 mg via INTRAVENOUS
  Filled 2018-10-26: qty 2

## 2018-10-26 MED ORDER — DIPHENHYDRAMINE HCL 25 MG PO CAPS
ORAL_CAPSULE | ORAL | Status: AC
  Filled 2018-10-26: qty 2

## 2018-10-26 NOTE — Patient Instructions (Signed)
[facility] Discharge Instructions for Patients Receiving Chemotherapy  Today you received the following chemotherapy agents Kryprolis  To help prevent nausea and vomiting after your treatment, we encourage you to take your nausea medication as directed  If you develop nausea and vomiting that is not controlled by your nausea medication, call the clinic.   BELOW ARE SYMPTOMS THAT SHOULD BE REPORTED IMMEDIATELY:  *FEVER GREATER THAN 100.5 F  *CHILLS WITH OR WITHOUT FEVER  NAUSEA AND VOMITING THAT IS NOT CONTROLLED WITH YOUR NAUSEA MEDICATION  *UNUSUAL SHORTNESS OF BREATH  *UNUSUAL BRUISING OR BLEEDING  TENDERNESS IN MOUTH AND THROAT WITH OR WITHOUT PRESENCE OF ULCERS  *URINARY PROBLEMS  *BOWEL PROBLEMS  UNUSUAL RASH Items with * indicate a potential emergency and should be followed up as soon as possible.  Feel free to call the clinic should you have any questions or concerns. The clinic phone number is [phone].  Please show the [place] at check-in to the Emergency Department and triage nurse.

## 2018-10-26 NOTE — Progress Notes (Signed)
Hematology and Oncology Follow Up Visit  Rickey [name] [id number] [birth year] 78 y.o. 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 Elotuzumab/Pomalidomide/decadron -every 4 weeks- start 01/12/2018 Kyprolis/Pomalidomide/Decadron -- start 04/06/2018    Interim History:  Rickey [name] is here today for follow-up.  He now has moved back to his not known.  He has been down at the beach for the wintertime.  He has been getting fantastic care from the oncologist at the cancer center and , [place].  He has been doing the Kyprolis/pomalidomide.  He has been responding to this.  [place]  His diabetes is doing okay.  He is very diligent in monitoring his blood sugars.  He has had no problems with pain.  There is no fever.  He has had no nausea or vomiting.  He has had no leg swelling.  There is been no change in bowel bladder habits.  Overall, his performance status is ECOG 1.    Medications:  Allergies as of 10/26/2018   No Known Allergies     Medication List       Accurate as of October 26, 2018 11:21 AM. If you have any questions, ask your nurse or doctor.        acyclovir 400 MG tablet Commonly known as:  ZOVIRAX TAKE 1 TABLET BY MOUTH TWICE DAILY   aspirin 81 MG tablet Take 81 mg by mouth daily.   atorvastatin 10 MG tablet Commonly known as:  LIPITOR Take 10 mg by mouth daily.   calcium-vitamin D 250-125 MG-UNIT tablet Commonly known as:  OSCAL Take 1 tablet by mouth daily.   cycloSPORINE 0.05 % ophthalmic emulsion Commonly known as:  RESTASIS Place 1 drop into both eyes 2 (two) times daily.   fish oil-omega-3 fatty acids 1000 MG capsule Take 1  g by mouth 2 (two) times daily.   INSULIN LISP & LISP PROT (HUM) [place]   insulin lispro 100 UNIT/ML injection Commonly known as:  HUMALOG Inject into the skin. Insulin pump   irbesartan-hydrochlorothiazide 300-12.5 MG tablet Commonly known as:  AVALIDE Take 1 tablet by mouth daily.   lidocaine-prilocaine cream Commonly known as:  EMLA Apply 1 application topically as needed.   montelukast 10 MG tablet Commonly known as:  SINGULAIR Take 10 mg by mouth at bedtime.   Myrbetriq 50 MG Tb24 tablet Generic drug:  mirabegron ER Take 50 mg by mouth daily.   ondansetron 4 MG disintegrating tablet Commonly known as:  ZOFRAN-ODT Take 4 mg by mouth 4 (four) times daily as needed for nausea or vomiting.   pomalidomide 4 MG capsule Commonly known as:  POMALYST Take 1 capsule (4 mg total) by mouth daily. Take with water on days 1-21. Repeat every 28 days. [id number]   Vitamin D 50 MCG (2000 UT) Caps Take by mouth every morning.       Allergies: No Known Allergies  Past Medical History, Surgical history, Social history, and Family History were reviewed and updated.  Review of Systems: Review of Systems  Constitutional: Negative.   HENT: Negative.   Eyes: Negative.   Respiratory: Negative.   Cardiovascular: Negative.  Gastrointestinal: Negative.   Genitourinary: Negative.   Musculoskeletal: Negative.   Skin: Negative.   Neurological: Negative.   Endo/Heme/Allergies: Negative.   Psychiatric/Behavioral: Negative.      Physical Exam:  weight is 205 lb (93 kg). His oral temperature is 98.4 F (36.9 C). His blood pressure is 128/71 and his pulse is 67. His respiration is 17 and oxygen saturation is 100%.   Wt Readings from Last 3 Encounters:  10/26/18 205 lb (93 kg)  05/04/18 198 lb (89.8 kg)  04/06/18 195 lb (88.5 kg)    Physical Exam Vitals signs reviewed.  HENT:     Head: Normocephalic and atraumatic.  Eyes:     Pupils: Pupils are equal, round, and reactive to  light.  Neck:     Musculoskeletal: Normal range of motion.  Cardiovascular:     Rate and Rhythm: Normal rate and regular rhythm.     Heart sounds: Normal heart sounds.  Pulmonary:     Effort: Pulmonary effort is normal.     Breath sounds: Normal breath sounds.  Abdominal:     General: Bowel sounds are normal.     Palpations: Abdomen is soft.  Musculoskeletal: Normal range of motion.        General: No tenderness or deformity.  Lymphadenopathy:     Cervical: No cervical adenopathy.  Skin:    General: Skin is warm and dry.     Findings: No erythema or rash.  Neurological:     Mental Status: He is alert and oriented to person, place, and time.  Psychiatric:        Behavior: Behavior normal.        Thought Content: Thought content normal.        Judgment: Judgment normal.      Lab Results  Component Value Date   WBC 4.0 10/26/2018   HGB 14.4 10/26/2018   HCT 42.6 10/26/2018   MCV 103.6 (H) 10/26/2018   PLT 165 10/26/2018   No results found for: FERRITIN, IRON, TIBC, UIBC, IRONPCTSAT Lab Results  Component Value Date   RBC 4.11 (L) 10/26/2018   Lab Results  Component Value Date   KPAFRELGTCHN 43.3 (H) 05/04/2018   LAMBDASER 2.4 (L) 05/04/2018   KAPLAMBRATIO 18.04 (H) 05/04/2018   Lab Results  Component Value Date   IGGSERUM 338 (L) 05/04/2018   IGA 25 (L) 05/04/2018   IGMSERUM 7 (L) 05/04/2018   Lab Results  Component Value Date   TOTALPROTELP 5.0 (L) 05/04/2018   ALBUMINELP 2.7 (L) 03/23/2018   A1GS 0.3 03/23/2018   A2GS 0.8 03/23/2018   BETS 0.6 (L) 03/23/2018   BETA2SER 0.3 04/19/2015   GAMS 0.4 03/23/2018   MSPIKE 0.1 (H) 03/23/2018   SPEI Comment 12/05/2017     Chemistry      Component Value Date/Time   NA 138 10/26/2018 1040   NA 142 04/23/2017 1431   NA 139 07/18/2016 1135   K 4.0 10/26/2018 1040   K 4.0 04/23/2017 1431   K 4.4 07/18/2016 1135   CL 106 10/26/2018 1040   CL 105 04/23/2017 1431   CO2 25 10/26/2018 1040   CO2 28 04/23/2017  1431   CO2 27 07/18/2016 1135   BUN 20 10/26/2018 1040   BUN 20 04/23/2017 1431   BUN 12.2 07/18/2016 1135   CREATININE 0.98 10/26/2018 1040   CREATININE 1.2 04/23/2017 1431   CREATININE 0.9 07/18/2016 1135      Component Value Date/Time   CALCIUM 8.5 (L) 10/26/2018 1040  CALCIUM 9.3 04/23/2017 1431   CALCIUM 8.9 07/18/2016 1135   ALKPHOS 38 10/26/2018 1040   ALKPHOS 64 04/23/2017 1431   ALKPHOS 58 07/18/2016 1135   AST 15 10/26/2018 1040   AST 15 07/18/2016 1135   ALT 19 10/26/2018 1040   ALT 22 04/23/2017 1431   ALT 20 07/18/2016 1135   BILITOT 1.3 (H) 10/26/2018 1040   BILITOT 1.24 (H) 07/18/2016 1135      Impression and Plan: Rickey [name] is a very pleasant 78 yo caucasian gentleman with 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   We will have to see what his light chains are at this time.  I think that as long as a stay as low as they are, we really do not have any problems.  He really should be doing okay.  I do think that if we find that he is progressing, I would consider him for CAR-T therapy.  I think he is good enough shape to tolerate this.  I realize that there are newer treatments that arm in the realm of CAR-T protocols.  We will treat him 3 weeks on and one-week off.  I will plan to get him back in 1 month.       [name], MD 6/1/202011:21 AM

## 2018-10-27 LAB — PROTEIN ELECTROPHORESIS, SERUM, WITH REFLEX
A/G Ratio: 1.7 (ref 0.7–1.7)
Albumin ELP: 3.3 g/dL (ref 2.9–4.4)
Alpha-1-Globulin: 0.2 g/dL (ref 0.0–0.4)
Alpha-2-Globulin: 0.7 g/dL (ref 0.4–1.0)
Beta Globulin: 0.7 g/dL (ref 0.7–1.3)
Gamma Globulin: 0.3 g/dL — ABNORMAL LOW (ref 0.4–1.8)
Globulin, Total: 1.9 g/dL — ABNORMAL LOW (ref 2.2–3.9)
Total Protein ELP: 5.2 g/dL — ABNORMAL LOW (ref 6.0–8.5)

## 2018-10-27 LAB — KAPPA/LAMBDA LIGHT CHAINS
Kappa free light chain: 37.9 mg/L — ABNORMAL HIGH (ref 3.3–19.4)
Kappa, lambda light chain ratio: 16.48 — ABNORMAL HIGH (ref 0.26–1.65)
Lambda free light chains: 2.3 mg/L — ABNORMAL LOW (ref 5.7–26.3)

## 2018-10-27 LAB — IGG, IGA, IGM
IgA: 24 mg/dL — ABNORMAL LOW (ref 61–437)
IgG (Immunoglobin G), Serum: 282 mg/dL — ABNORMAL LOW (ref 603–1613)
IgM (Immunoglobulin M), Srm: 6 mg/dL — ABNORMAL LOW (ref 15–143)

## 2018-10-28 ENCOUNTER — Telehealth: Payer: Self-pay | Admitting: *Deleted

## 2018-10-28 NOTE — Telephone Encounter (Signed)
-----   Message from [name], MD sent at 10/28/2018  6:46 AM EDT ----- Call - the kappa light chain is now down to 38!!!   Great job!!!  Rickey [name]

## 2018-10-28 NOTE — Telephone Encounter (Signed)
Notified pt of lab results. Pt verbalized understanding. 

## 2018-11-02 ENCOUNTER — Other Ambulatory Visit: Payer: Self-pay

## 2018-11-02 ENCOUNTER — Inpatient Hospital Stay: Payer: Medicare Other

## 2018-11-02 VITALS — BP 121/71 | HR 58 | Temp 98.0°F | Resp 18

## 2018-11-02 DIAGNOSIS — C9002 Multiple myeloma in relapse: Secondary | ICD-10-CM | POA: Diagnosis not present

## 2018-11-02 DIAGNOSIS — E119 Type 2 diabetes mellitus without complications: Secondary | ICD-10-CM | POA: Diagnosis not present

## 2018-11-02 DIAGNOSIS — C9000 Multiple myeloma not having achieved remission: Secondary | ICD-10-CM

## 2018-11-02 DIAGNOSIS — Z5112 Encounter for antineoplastic immunotherapy: Secondary | ICD-10-CM | POA: Diagnosis not present

## 2018-11-02 LAB — CBC WITH DIFFERENTIAL (CANCER CENTER ONLY)
Abs Immature Granulocytes: 0.07 10*3/uL (ref 0.00–0.07)
Basophils Absolute: 0.1 10*3/uL (ref 0.0–0.1)
Basophils Relative: 1 %
Eosinophils Absolute: 0.2 10*3/uL (ref 0.0–0.5)
Eosinophils Relative: 5 %
HCT: 42.3 % (ref 39.0–52.0)
Hemoglobin: 14.2 g/dL (ref 13.0–17.0)
Immature Granulocytes: 2 %
Lymphocytes Relative: 32 %
Lymphs Abs: 1.2 10*3/uL (ref 0.7–4.0)
MCH: 35.5 pg — ABNORMAL HIGH (ref 26.0–34.0)
MCHC: 33.6 g/dL (ref 30.0–36.0)
MCV: 105.8 fL — ABNORMAL HIGH (ref 80.0–100.0)
Monocytes Absolute: 0.7 10*3/uL (ref 0.1–1.0)
Monocytes Relative: 18 %
Neutro Abs: 1.5 10*3/uL — ABNORMAL LOW (ref 1.7–7.7)
Neutrophils Relative %: 42 %
Platelet Count: 108 10*3/uL — ABNORMAL LOW (ref 150–400)
RBC: 4 MIL/uL — ABNORMAL LOW (ref 4.22–5.81)
RDW: 15 % (ref 11.5–15.5)
WBC Count: 3.7 10*3/uL — ABNORMAL LOW (ref 4.0–10.5)
nRBC: 0 % (ref 0.0–0.2)

## 2018-11-02 LAB — CMP (CANCER CENTER ONLY)
ALT: 19 U/L (ref 0–44)
AST: 13 U/L — ABNORMAL LOW (ref 15–41)
Albumin: 3.8 g/dL (ref 3.5–5.0)
Alkaline Phosphatase: 40 U/L (ref 38–126)
Anion gap: 8 (ref 5–15)
BUN: 19 mg/dL (ref 8–23)
CO2: 25 mmol/L (ref 22–32)
Calcium: 8.9 mg/dL (ref 8.9–10.3)
Chloride: 104 mmol/L (ref 98–111)
Creatinine: 1 mg/dL (ref 0.61–1.24)
GFR, Est AFR Am: >60 mL/min (ref >60)
GFR, Estimated: >60 mL/min (ref >60)
Glucose, Bld: 173 mg/dL — ABNORMAL HIGH (ref 70–99)
Potassium: 3.9 mmol/L (ref 3.5–5.1)
Sodium: 137 mmol/L (ref 135–145)
Total Bilirubin: 1.2 mg/dL (ref 0.3–1.2)
Total Protein: 5.4 g/dL — ABNORMAL LOW (ref 6.5–8.1)

## 2018-11-02 MED ORDER — DEXTROSE 5 % IV SOLN
71.0000 mg/m2 | Freq: Once | INTRAVENOUS | Status: AC
  Administered 2018-11-02: 150 mg via INTRAVENOUS
  Filled 2018-11-02: qty 60

## 2018-11-02 MED ORDER — SODIUM CHLORIDE 0.9 % IV SOLN
20.0000 mg | Freq: Once | INTRAVENOUS | Status: AC
  Administered 2018-11-02: 20 mg via INTRAVENOUS
  Filled 2018-11-02: qty 2

## 2018-11-02 MED ORDER — ACETAMINOPHEN 325 MG PO TABS
650.0000 mg | ORAL_TABLET | Freq: Once | ORAL | Status: AC
  Administered 2018-11-02: 650 mg via ORAL

## 2018-11-02 MED ORDER — DIPHENHYDRAMINE HCL 25 MG PO CAPS
ORAL_CAPSULE | ORAL | Status: AC
  Filled 2018-11-02: qty 2

## 2018-11-02 MED ORDER — DIPHENHYDRAMINE HCL 25 MG PO CAPS
50.0000 mg | ORAL_CAPSULE | Freq: Once | ORAL | Status: AC
  Administered 2018-11-02: 50 mg via ORAL

## 2018-11-02 MED ORDER — HEPARIN SOD (PORK) LOCK FLUSH 100 UNIT/ML IV SOLN
500.0000 [IU] | Freq: Once | INTRAVENOUS | Status: AC | PRN
  Administered 2018-11-02: 500 [IU]
  Filled 2018-11-02: qty 5

## 2018-11-02 MED ORDER — SODIUM CHLORIDE 0.9% FLUSH
10.0000 mL | INTRAVENOUS | Status: DC | PRN
  Administered 2018-11-02: 10 mL
  Filled 2018-11-02: qty 10

## 2018-11-02 MED ORDER — SODIUM CHLORIDE 0.9 % IV SOLN
Freq: Once | INTRAVENOUS | Status: AC
  Administered 2018-11-02: 12:00:00 via INTRAVENOUS
  Filled 2018-11-02: qty 250

## 2018-11-02 MED ORDER — SODIUM CHLORIDE 0.9% FLUSH
10.0000 mL | Freq: Once | INTRAVENOUS | Status: AC
  Administered 2018-11-02: 10 mL
  Filled 2018-11-02: qty 10

## 2018-11-02 MED ORDER — PROCHLORPERAZINE MALEATE 10 MG PO TABS
ORAL_TABLET | ORAL | Status: AC
  Filled 2018-11-02: qty 1

## 2018-11-02 MED ORDER — ACETAMINOPHEN 325 MG PO TABS
ORAL_TABLET | ORAL | Status: AC
  Filled 2018-11-02: qty 2

## 2018-11-02 MED ORDER — PROCHLORPERAZINE MALEATE 10 MG PO TABS
10.0000 mg | ORAL_TABLET | Freq: Once | ORAL | Status: AC
  Administered 2018-11-02: 10 mg via ORAL

## 2018-11-02 NOTE — Patient Instructions (Signed)

## 2018-11-02 NOTE — Patient Instructions (Signed)
[facility] Discharge Instructions for Patients Receiving Chemotherapy  Today you received the following chemotherapy agents:  Kyprolis  To help prevent nausea and vomiting after your treatment, we encourage you to take your nausea medication as prescribed.   If you develop nausea and vomiting that is not controlled by your nausea medication, call the clinic.   BELOW ARE SYMPTOMS THAT SHOULD BE REPORTED IMMEDIATELY:  *FEVER GREATER THAN 100.5 F  *CHILLS WITH OR WITHOUT FEVER  NAUSEA AND VOMITING THAT IS NOT CONTROLLED WITH YOUR NAUSEA MEDICATION  *UNUSUAL SHORTNESS OF BREATH  *UNUSUAL BRUISING OR BLEEDING  TENDERNESS IN MOUTH AND THROAT WITH OR WITHOUT PRESENCE OF ULCERS  *URINARY PROBLEMS  *BOWEL PROBLEMS  UNUSUAL RASH Items with * indicate a potential emergency and should be followed up as soon as possible.  Feel free to call the clinic should you have any questions or concerns. The clinic phone number is [phone].  Please show the CHEMO ALERT CARD at check-in to the Emergency Department and triage nurse.   

## 2018-11-09 ENCOUNTER — Inpatient Hospital Stay: Payer: Medicare Other

## 2018-11-09 ENCOUNTER — Other Ambulatory Visit: Payer: Self-pay

## 2018-11-09 DIAGNOSIS — E119 Type 2 diabetes mellitus without complications: Secondary | ICD-10-CM | POA: Diagnosis not present

## 2018-11-09 DIAGNOSIS — C9000 Multiple myeloma not having achieved remission: Secondary | ICD-10-CM

## 2018-11-09 DIAGNOSIS — C9002 Multiple myeloma in relapse: Secondary | ICD-10-CM | POA: Diagnosis not present

## 2018-11-09 DIAGNOSIS — Z5112 Encounter for antineoplastic immunotherapy: Secondary | ICD-10-CM | POA: Diagnosis not present

## 2018-11-09 LAB — CMP (CANCER CENTER ONLY)
ALT: 17 U/L (ref 0–44)
AST: 12 U/L — ABNORMAL LOW (ref 15–41)
Albumin: 3.8 g/dL (ref 3.5–5.0)
Alkaline Phosphatase: 34 U/L — ABNORMAL LOW (ref 38–126)
Anion gap: 6 (ref 5–15)
BUN: 21 mg/dL (ref 8–23)
CO2: 25 mmol/L (ref 22–32)
Calcium: 8.8 mg/dL — ABNORMAL LOW (ref 8.9–10.3)
Chloride: 106 mmol/L (ref 98–111)
Creatinine: 0.87 mg/dL (ref 0.61–1.24)
GFR, Est AFR Am: >60 mL/min (ref >60)
GFR, Estimated: >60 mL/min (ref >60)
Glucose, Bld: 159 mg/dL — ABNORMAL HIGH (ref 70–99)
Potassium: 3.9 mmol/L (ref 3.5–5.1)
Sodium: 137 mmol/L (ref 135–145)
Total Bilirubin: 1.3 mg/dL — ABNORMAL HIGH (ref 0.3–1.2)
Total Protein: 5.2 g/dL — ABNORMAL LOW (ref 6.5–8.1)

## 2018-11-09 LAB — CBC WITH DIFFERENTIAL (CANCER CENTER ONLY)
Abs Immature Granulocytes: 0.03 10*3/uL (ref 0.00–0.07)
Basophils Absolute: 0.1 10*3/uL (ref 0.0–0.1)
Basophils Relative: 1 %
Eosinophils Absolute: 0.2 10*3/uL (ref 0.0–0.5)
Eosinophils Relative: 4 %
HCT: 43 % (ref 39.0–52.0)
Hemoglobin: 14.5 g/dL (ref 13.0–17.0)
Immature Granulocytes: 1 %
Lymphocytes Relative: 25 %
Lymphs Abs: 1.3 10*3/uL (ref 0.7–4.0)
MCH: 35.5 pg — ABNORMAL HIGH (ref 26.0–34.0)
MCHC: 33.7 g/dL (ref 30.0–36.0)
MCV: 105.1 fL — ABNORMAL HIGH (ref 80.0–100.0)
Monocytes Absolute: 1.5 10*3/uL — ABNORMAL HIGH (ref 0.1–1.0)
Monocytes Relative: 27 %
Neutro Abs: 2.3 10*3/uL (ref 1.7–7.7)
Neutrophils Relative %: 42 %
Platelet Count: 130 10*3/uL — ABNORMAL LOW (ref 150–400)
RBC: 4.09 MIL/uL — ABNORMAL LOW (ref 4.22–5.81)
RDW: 14.6 % (ref 11.5–15.5)
WBC Count: 5.4 10*3/uL (ref 4.0–10.5)
nRBC: 0 % (ref 0.0–0.2)

## 2018-11-09 MED ORDER — PROCHLORPERAZINE MALEATE 10 MG PO TABS
10.0000 mg | ORAL_TABLET | Freq: Once | ORAL | Status: AC
  Administered 2018-11-09: 10 mg via ORAL

## 2018-11-09 MED ORDER — SODIUM CHLORIDE 0.9 % IV SOLN
Freq: Once | INTRAVENOUS | Status: AC
  Administered 2018-11-09: 12:00:00 via INTRAVENOUS
  Filled 2018-11-09: qty 250

## 2018-11-09 MED ORDER — SODIUM CHLORIDE 0.9 % IV SOLN
Freq: Once | INTRAVENOUS | Status: AC
  Administered 2018-11-09: 13:00:00 via INTRAVENOUS
  Filled 2018-11-09: qty 250

## 2018-11-09 MED ORDER — DIPHENHYDRAMINE HCL 25 MG PO CAPS
ORAL_CAPSULE | ORAL | Status: AC
  Filled 2018-11-09: qty 2

## 2018-11-09 MED ORDER — DEXTROSE 5 % IV SOLN
71.0000 mg/m2 | Freq: Once | INTRAVENOUS | Status: AC
  Administered 2018-11-09: 150 mg via INTRAVENOUS
  Filled 2018-11-09: qty 60

## 2018-11-09 MED ORDER — DIPHENHYDRAMINE HCL 25 MG PO CAPS
50.0000 mg | ORAL_CAPSULE | Freq: Once | ORAL | Status: AC
  Administered 2018-11-09: 50 mg via ORAL

## 2018-11-09 MED ORDER — SODIUM CHLORIDE 0.9% FLUSH
10.0000 mL | INTRAVENOUS | Status: DC | PRN
  Administered 2018-11-09: 10 mL
  Filled 2018-11-09: qty 10

## 2018-11-09 MED ORDER — ACETAMINOPHEN 325 MG PO TABS
ORAL_TABLET | ORAL | Status: AC
  Filled 2018-11-09: qty 2

## 2018-11-09 MED ORDER — SODIUM CHLORIDE 0.9 % IV SOLN
20.0000 mg | Freq: Once | INTRAVENOUS | Status: AC
  Administered 2018-11-09: 20 mg via INTRAVENOUS
  Filled 2018-11-09: qty 2

## 2018-11-09 MED ORDER — HEPARIN SOD (PORK) LOCK FLUSH 100 UNIT/ML IV SOLN
500.0000 [IU] | Freq: Once | INTRAVENOUS | Status: AC | PRN
  Administered 2018-11-09: 500 [IU]
  Filled 2018-11-09: qty 5

## 2018-11-09 MED ORDER — PROCHLORPERAZINE MALEATE 10 MG PO TABS
ORAL_TABLET | ORAL | Status: AC
  Filled 2018-11-09: qty 1

## 2018-11-09 MED ORDER — ACETAMINOPHEN 325 MG PO TABS
650.0000 mg | ORAL_TABLET | Freq: Once | ORAL | Status: AC
  Administered 2018-11-09: 650 mg via ORAL

## 2018-11-09 NOTE — Patient Instructions (Signed)
[facility] Discharge Instructions for Patients Receiving Chemotherapy  Today you received the following chemotherapy agents:  Kyprolis  To help prevent nausea and vomiting after your treatment, we encourage you to take your nausea medication as prescribed.   If you develop nausea and vomiting that is not controlled by your nausea medication, call the clinic.   BELOW ARE SYMPTOMS THAT SHOULD BE REPORTED IMMEDIATELY:  *FEVER GREATER THAN 100.5 F  *CHILLS WITH OR WITHOUT FEVER  NAUSEA AND VOMITING THAT IS NOT CONTROLLED WITH YOUR NAUSEA MEDICATION  *UNUSUAL SHORTNESS OF BREATH  *UNUSUAL BRUISING OR BLEEDING  TENDERNESS IN MOUTH AND THROAT WITH OR WITHOUT PRESENCE OF ULCERS  *URINARY PROBLEMS  *BOWEL PROBLEMS  UNUSUAL RASH Items with * indicate a potential emergency and should be followed up as soon as possible.  Feel free to call the clinic should you have any questions or concerns. The clinic phone number is [phone].  Please show the CHEMO ALERT CARD at check-in to the Emergency Department and triage nurse.   

## 2018-11-09 NOTE — Patient Instructions (Signed)
[facility] Discharge Instructions for Patients Receiving Chemotherapy  Today you received the following chemotherapy agents:  Kyprolis  To help prevent nausea and vomiting after your treatment, we encourage you to take your nausea medication as prescribed.   If you develop nausea and vomiting that is not controlled by your nausea medication, call the clinic.   BELOW ARE SYMPTOMS THAT SHOULD BE REPORTED IMMEDIATELY:  *FEVER GREATER THAN 100.5 F  *CHILLS WITH OR WITHOUT FEVER  NAUSEA AND VOMITING THAT IS NOT CONTROLLED WITH YOUR NAUSEA MEDICATION  *UNUSUAL SHORTNESS OF BREATH  *UNUSUAL BRUISING OR BLEEDING  TENDERNESS IN MOUTH AND THROAT WITH OR WITHOUT PRESENCE OF ULCERS  *URINARY PROBLEMS  *BOWEL PROBLEMS  UNUSUAL RASH Items with * indicate a potential emergency and should be followed up as soon as possible.  Feel free to call the clinic should you have any questions or concerns. The clinic phone number is [phone].  Please show the CHEMO ALERT CARD at check-in to the Emergency Department and triage nurse.   

## 2018-11-11 ENCOUNTER — Other Ambulatory Visit: Payer: Self-pay | Admitting: *Deleted

## 2018-11-11 DIAGNOSIS — C9000 Multiple myeloma not having achieved remission: Secondary | ICD-10-CM

## 2018-11-11 DIAGNOSIS — S91332A Puncture wound without foreign body, left foot, initial encounter: Secondary | ICD-10-CM

## 2018-11-11 MED ORDER — POMALIDOMIDE 4 MG PO CAPS: 4.0000 mg | ORAL_CAPSULE | Freq: Every day | ORAL | 0 refills | Status: DC

## 2018-11-16 ENCOUNTER — Ambulatory Visit: Payer: Medicare Other

## 2018-11-16 ENCOUNTER — Other Ambulatory Visit: Payer: Medicare Other

## 2018-11-20 ENCOUNTER — Other Ambulatory Visit: Payer: Self-pay

## 2018-11-20 DIAGNOSIS — C9000 Multiple myeloma not having achieved remission: Secondary | ICD-10-CM

## 2018-11-23 ENCOUNTER — Telehealth: Payer: Self-pay

## 2018-11-23 ENCOUNTER — Encounter: Payer: Self-pay | Admitting: Hematology & Oncology

## 2018-11-23 ENCOUNTER — Other Ambulatory Visit: Payer: Self-pay

## 2018-11-23 ENCOUNTER — Inpatient Hospital Stay: Payer: Medicare Other

## 2018-11-23 ENCOUNTER — Inpatient Hospital Stay (HOSPITAL_BASED_OUTPATIENT_CLINIC_OR_DEPARTMENT_OTHER): Payer: Medicare Other | Admitting: Hematology & Oncology

## 2018-11-23 VITALS — BP 123/66 | HR 64 | Temp 97.6°F | Resp 18

## 2018-11-23 DIAGNOSIS — C9000 Multiple myeloma not having achieved remission: Secondary | ICD-10-CM

## 2018-11-23 DIAGNOSIS — C9002 Multiple myeloma in relapse: Secondary | ICD-10-CM | POA: Diagnosis not present

## 2018-11-23 DIAGNOSIS — E119 Type 2 diabetes mellitus without complications: Secondary | ICD-10-CM | POA: Diagnosis not present

## 2018-11-23 DIAGNOSIS — Z5112 Encounter for antineoplastic immunotherapy: Secondary | ICD-10-CM | POA: Diagnosis not present

## 2018-11-23 LAB — CBC WITH DIFFERENTIAL (CANCER CENTER ONLY)
Abs Immature Granulocytes: 0.01 10*3/uL (ref 0.00–0.07)
Basophils Absolute: 0.1 10*3/uL (ref 0.0–0.1)
Basophils Relative: 3 %
Eosinophils Absolute: 0.1 10*3/uL (ref 0.0–0.5)
Eosinophils Relative: 3 %
HCT: 42.8 % (ref 39.0–52.0)
Hemoglobin: 14.6 g/dL (ref 13.0–17.0)
Immature Granulocytes: 0 %
Lymphocytes Relative: 40 %
Lymphs Abs: 1.3 10*3/uL (ref 0.7–4.0)
MCH: 36.2 pg — ABNORMAL HIGH (ref 26.0–34.0)
MCHC: 34.1 g/dL (ref 30.0–36.0)
MCV: 106.2 fL — ABNORMAL HIGH (ref 80.0–100.0)
Monocytes Absolute: 0.6 10*3/uL (ref 0.1–1.0)
Monocytes Relative: 18 %
Neutro Abs: 1.2 10*3/uL — ABNORMAL LOW (ref 1.7–7.7)
Neutrophils Relative %: 36 %
Platelet Count: 157 10*3/uL (ref 150–400)
RBC: 4.03 MIL/uL — ABNORMAL LOW (ref 4.22–5.81)
RDW: 14.5 % (ref 11.5–15.5)
WBC Count: 3.3 10*3/uL — ABNORMAL LOW (ref 4.0–10.5)
nRBC: 0 % (ref 0.0–0.2)

## 2018-11-23 LAB — CMP (CANCER CENTER ONLY)
ALT: 20 U/L (ref 0–44)
AST: 15 U/L (ref 15–41)
Albumin: 3.9 g/dL (ref 3.5–5.0)
Alkaline Phosphatase: 36 U/L — ABNORMAL LOW (ref 38–126)
Anion gap: 6 (ref 5–15)
BUN: 22 mg/dL (ref 8–23)
CO2: 25 mmol/L (ref 22–32)
Calcium: 8.9 mg/dL (ref 8.9–10.3)
Chloride: 107 mmol/L (ref 98–111)
Creatinine: 0.97 mg/dL (ref 0.61–1.24)
GFR, Est AFR Am: >60 mL/min (ref >60)
GFR, Estimated: >60 mL/min (ref >60)
Glucose, Bld: 110 mg/dL — ABNORMAL HIGH (ref 70–99)
Potassium: 4 mmol/L (ref 3.5–5.1)
Sodium: 138 mmol/L (ref 135–145)
Total Bilirubin: 1.3 mg/dL — ABNORMAL HIGH (ref 0.3–1.2)
Total Protein: 5.3 g/dL — ABNORMAL LOW (ref 6.5–8.1)

## 2018-11-23 LAB — LACTATE DEHYDROGENASE: LDH: 155 U/L (ref 98–192)

## 2018-11-23 MED ORDER — ACETAMINOPHEN 325 MG PO TABS
ORAL_TABLET | ORAL | Status: AC
  Filled 2018-11-23: qty 2

## 2018-11-23 MED ORDER — DIPHENHYDRAMINE HCL 25 MG PO CAPS
50.0000 mg | ORAL_CAPSULE | Freq: Once | ORAL | Status: AC
  Administered 2018-11-23: 50 mg via ORAL

## 2018-11-23 MED ORDER — SODIUM CHLORIDE 0.9 % IV SOLN
20.0000 mg | Freq: Once | INTRAVENOUS | Status: AC
  Administered 2018-11-23: 20 mg via INTRAVENOUS
  Filled 2018-11-23: qty 2

## 2018-11-23 MED ORDER — DIPHENHYDRAMINE HCL 25 MG PO CAPS
ORAL_CAPSULE | ORAL | Status: AC
  Filled 2018-11-23: qty 2

## 2018-11-23 MED ORDER — HEPARIN SOD (PORK) LOCK FLUSH 100 UNIT/ML IV SOLN
500.0000 [IU] | Freq: Once | INTRAVENOUS | Status: AC | PRN
  Administered 2018-11-23: 500 [IU]
  Filled 2018-11-23: qty 5

## 2018-11-23 MED ORDER — PROCHLORPERAZINE MALEATE 10 MG PO TABS
10.0000 mg | ORAL_TABLET | Freq: Once | ORAL | Status: AC
  Administered 2018-11-23: 10 mg via ORAL

## 2018-11-23 MED ORDER — SODIUM CHLORIDE 0.9% FLUSH
10.0000 mL | INTRAVENOUS | Status: DC | PRN
  Administered 2018-11-23: 10 mL
  Filled 2018-11-23: qty 10

## 2018-11-23 MED ORDER — DEXTROSE 5 % IV SOLN
71.0000 mg/m2 | Freq: Once | INTRAVENOUS | Status: AC
  Administered 2018-11-23: 150 mg via INTRAVENOUS
  Filled 2018-11-23: qty 60

## 2018-11-23 MED ORDER — PROCHLORPERAZINE MALEATE 10 MG PO TABS
ORAL_TABLET | ORAL | Status: AC
  Filled 2018-11-23: qty 1

## 2018-11-23 MED ORDER — ACETAMINOPHEN 325 MG PO TABS
650.0000 mg | ORAL_TABLET | Freq: Once | ORAL | Status: AC
  Administered 2018-11-23: 650 mg via ORAL

## 2018-11-23 MED ORDER — SODIUM CHLORIDE 0.9 % IV SOLN
Freq: Once | INTRAVENOUS | Status: AC
  Administered 2018-11-23: 13:00:00 via INTRAVENOUS
  Filled 2018-11-23: qty 250

## 2018-11-23 NOTE — Telephone Encounter (Signed)
Okay to treat today with ANC 1.2 per Dr. Ennever. 

## 2018-11-23 NOTE — Progress Notes (Signed)
Hematology and Oncology Follow Up Visit  Rickey [name] [id number] 1941-02-21 78 y.o. 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 Elotuzumab/Pomalidomide/decadron -every 4 weeks- start 01/12/2018 Kyprolis/Pomalidomide/Decadron -- s/p cycle #3 -- start 04/06/2018    Interim History:  Rickey [name] is here today for follow-up.  Right now, he is doing quite well.  He actually is on the off week for his pomalidomide.  His white cell count is only 3.3.  I told him to hold the pomalidomide for another week.  It is possible that we may have to decrease the dose of the pomalidomide.  He is on 4 mg daily dose.  His last kappa light chain was down to 3.8 mg/dL.  This continues to improve.  He is done well with the Kyprolis.  He has had no problems with nausea or vomiting.  There has been no diarrhea.  There has been no cough.  He has had no leg swelling.  There has been no rashes.  There is been no fever.  [place]  [place] so [place] is not as much of a problem with runoff.  Overall, his performance status is ECOG 1.    Medications:  Allergies as of 11/23/2018   No Known Allergies     Medication List       Accurate as of November 23, 2018 12:29 PM. If you have any questions, ask your nurse or doctor.        acyclovir 400 MG tablet Commonly known as: ZOVIRAX TAKE 1 TABLET BY MOUTH TWICE DAILY   aspirin 81 MG tablet Take 81 mg by mouth daily.   atorvastatin 10 MG tablet Commonly known as: LIPITOR Take 10 mg by mouth daily.   calcium-vitamin D 250-125 MG-UNIT tablet Commonly known as: OSCAL Take 1 tablet by mouth daily.   cycloSPORINE  0.05 % ophthalmic emulsion Commonly known as: RESTASIS Place 1 drop into both eyes 2 (two) times daily.   fish oil-omega-3 fatty acids 1000 MG capsule Take 1 g by mouth 2 (two) times daily.   INSULIN LISP & LISP PROT (HUM) [place]   insulin lispro 100 UNIT/ML injection Commonly known as: HUMALOG Inject into the skin. Insulin pump   irbesartan-hydrochlorothiazide 300-12.5 MG tablet Commonly known as: AVALIDE Take 1 tablet by mouth daily.   lidocaine-prilocaine cream Commonly known as: EMLA Apply 1 application topically as needed.   montelukast 10 MG tablet Commonly known as: SINGULAIR Take 10 mg by mouth at bedtime.   Myrbetriq 50 MG Tb24 tablet Generic drug: mirabegron ER Take 50 mg by mouth daily.   ondansetron 4 MG disintegrating tablet Commonly known as: ZOFRAN-ODT Take 4 mg by mouth 4 (four) times daily as needed for nausea or vomiting.   pomalidomide 4 MG capsule Commonly known as: POMALYST Take 1 capsule (4 mg total) by mouth daily. Take with water on days 1-21. Repeat every 28 days. [id number]   Vitamin D 50 MCG (2000 UT) Caps Take by mouth every morning.       Allergies: No Known Allergies  Past Medical History, Surgical history, Social history, and Family History were reviewed  and updated.  Review of Systems: Review of Systems  Constitutional: Negative.   HENT: Negative.   Eyes: Negative.   Respiratory: Negative.   Cardiovascular: Negative.   Gastrointestinal: Negative.   Genitourinary: Negative.   Musculoskeletal: Negative.   Skin: Negative.   Neurological: Negative.   Endo/Heme/Allergies: Negative.   Psychiatric/Behavioral: Negative.      Physical Exam:  temperature is 97.6 F (36.4 C). His blood pressure is 123/66 and his pulse is 64. His respiration is 18 and oxygen saturation is 100%.   Wt Readings from Last 3 Encounters:  11/09/18 206 lb 1.3 oz (93.5 kg)  10/26/18 205 lb (93 kg)  05/04/18 198 lb (89.8 kg)    Physical Exam Vitals  signs reviewed.  HENT:     Head: Normocephalic and atraumatic.  Eyes:     Pupils: Pupils are equal, round, and reactive to light.  Neck:     Musculoskeletal: Normal range of motion.  Cardiovascular:     Rate and Rhythm: Normal rate and regular rhythm.     Heart sounds: Normal heart sounds.  Pulmonary:     Effort: Pulmonary effort is normal.     Breath sounds: Normal breath sounds.  Abdominal:     General: Bowel sounds are normal.     Palpations: Abdomen is soft.  Musculoskeletal: Normal range of motion.        General: No tenderness or deformity.  Lymphadenopathy:     Cervical: No cervical adenopathy.  Skin:    General: Skin is warm and dry.     Findings: No erythema or rash.  Neurological:     Mental Status: He is alert and oriented to person, place, and time.  Psychiatric:        Behavior: Behavior normal.        Thought Content: Thought content normal.        Judgment: Judgment normal.      Lab Results  Component Value Date   WBC 3.3 (L) 11/23/2018   HGB 14.6 11/23/2018   HCT 42.8 11/23/2018   MCV 106.2 (H) 11/23/2018   PLT 157 11/23/2018   No results found for: FERRITIN, IRON, TIBC, UIBC, IRONPCTSAT Lab Results  Component Value Date   RBC 4.03 (L) 11/23/2018   Lab Results  Component Value Date   KPAFRELGTCHN 37.9 (H) 10/26/2018   LAMBDASER 2.3 (L) 10/26/2018   KAPLAMBRATIO 16.48 (H) 10/26/2018   Lab Results  Component Value Date   IGGSERUM 282 (L) 10/26/2018   IGA 24 (L) 10/26/2018   IGMSERUM 6 (L) 10/26/2018   Lab Results  Component Value Date   TOTALPROTELP 5.2 (L) 10/26/2018   ALBUMINELP 3.3 10/26/2018   A1GS 0.2 10/26/2018   A2GS 0.7 10/26/2018   BETS 0.7 10/26/2018   BETA2SER 0.3 04/19/2015   GAMS 0.3 (L) 10/26/2018   MSPIKE Not Observed 10/26/2018   SPEI Comment 12/05/2017     Chemistry      Component Value Date/Time   NA 138 11/23/2018 1040   NA 142 04/23/2017 1431   NA 139 07/18/2016 1135   K 4.0 11/23/2018 1040   K 4.0  04/23/2017 1431   K 4.4 07/18/2016 1135   CL 107 11/23/2018 1040   CL 105 04/23/2017 1431   CO2 25 11/23/2018 1040   CO2 28 04/23/2017 1431   CO2 27 07/18/2016 1135   BUN 22 11/23/2018 1040   BUN 20 04/23/2017 1431   BUN 12.2 07/18/2016 1135   CREATININE 0.97 11/23/2018 1040   CREATININE  1.2 04/23/2017 1431   CREATININE 0.9 07/18/2016 1135      Component Value Date/Time   CALCIUM 8.9 11/23/2018 1040   CALCIUM 9.3 04/23/2017 1431   CALCIUM 8.9 07/18/2016 1135   ALKPHOS 36 (L) 11/23/2018 1040   ALKPHOS 64 04/23/2017 1431   ALKPHOS 58 07/18/2016 1135   AST 15 11/23/2018 1040   AST 15 07/18/2016 1135   ALT 20 11/23/2018 1040   ALT 22 04/23/2017 1431   ALT 20 07/18/2016 1135   BILITOT 1.3 (H) 11/23/2018 1040   BILITOT 1.24 (H) 07/18/2016 1135      Impression and Plan: Mr. 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   Hopefully, his light chains will be even better.  It would be nice that he get off the pomalidomide at some point to improve.  He will continue his weekly Kyprolis for 3 weeks on and one-week off.  I will plan to see him back in 4 weeks.   [name], MD 6/29/202012:29 PM

## 2018-11-23 NOTE — Patient Instructions (Signed)
[facility] Discharge Instructions for Patients Receiving Chemotherapy  Today you received the following chemotherapy agents: Kyprolis   To help prevent nausea and vomiting after your treatment, we encourage you to take your nausea medication as directed.    If you develop nausea and vomiting that is not controlled by your nausea medication, call the clinic.   BELOW ARE SYMPTOMS THAT SHOULD BE REPORTED IMMEDIATELY:  *FEVER GREATER THAN 100.5 F  *CHILLS WITH OR WITHOUT FEVER  NAUSEA AND VOMITING THAT IS NOT CONTROLLED WITH YOUR NAUSEA MEDICATION  *UNUSUAL SHORTNESS OF BREATH  *UNUSUAL BRUISING OR BLEEDING  TENDERNESS IN MOUTH AND THROAT WITH OR WITHOUT PRESENCE OF ULCERS  *URINARY PROBLEMS  *BOWEL PROBLEMS  UNUSUAL RASH Items with * indicate a potential emergency and should be followed up as soon as possible.  Feel free to call the clinic you have any questions or concerns. The clinic phone number is [phone].  Please show the CHEMO ALERT CARD at check-in to the Emergency Department and triage nurse.   

## 2018-11-24 LAB — PROTEIN ELECTROPHORESIS, SERUM
A/G Ratio: 2.3 — ABNORMAL HIGH (ref 0.7–1.7)
Albumin ELP: 3.5 g/dL (ref 2.9–4.4)
Alpha-1-Globulin: 0.2 g/dL (ref 0.0–0.4)
Alpha-2-Globulin: 0.6 g/dL (ref 0.4–1.0)
Beta Globulin: 0.6 g/dL — ABNORMAL LOW (ref 0.7–1.3)
Gamma Globulin: 0.2 g/dL — ABNORMAL LOW (ref 0.4–1.8)
Globulin, Total: 1.5 g/dL — ABNORMAL LOW (ref 2.2–3.9)
Total Protein ELP: 5 g/dL — ABNORMAL LOW (ref 6.0–8.5)

## 2018-11-24 LAB — IGA: IgA: 17 mg/dL — ABNORMAL LOW (ref 61–437)

## 2018-11-24 LAB — IGM: IgM (Immunoglobulin M), Srm: <5 mg/dL — ABNORMAL LOW (ref 15–143)

## 2018-11-24 LAB — KAPPA/LAMBDA LIGHT CHAINS
Kappa free light chain: 39.1 mg/L — ABNORMAL HIGH (ref 3.3–19.4)
Kappa, lambda light chain ratio: 19.55 — ABNORMAL HIGH (ref 0.26–1.65)
Lambda free light chains: 2 mg/L — ABNORMAL LOW (ref 5.7–26.3)

## 2018-11-24 LAB — IGG: IgG (Immunoglobin G), Serum: 223 mg/dL — ABNORMAL LOW (ref 603–1613)

## 2018-11-25 ENCOUNTER — Telehealth: Payer: Self-pay | Admitting: *Deleted

## 2018-11-25 NOTE — Telephone Encounter (Addendum)
Patient is aware of results.   ----- Message from [name], MD sent at 11/25/2018  6:58 AM EDT ----- Call - the kappa light chain is low and stable!!!  This is never be a problem at this level!!!  pete

## 2018-11-30 ENCOUNTER — Inpatient Hospital Stay: Payer: Medicare Other | Attending: Hematology & Oncology

## 2018-11-30 ENCOUNTER — Other Ambulatory Visit: Payer: Self-pay | Admitting: Oncology

## 2018-11-30 ENCOUNTER — Other Ambulatory Visit: Payer: Self-pay

## 2018-11-30 ENCOUNTER — Inpatient Hospital Stay: Payer: Medicare Other

## 2018-11-30 DIAGNOSIS — C9000 Multiple myeloma not having achieved remission: Secondary | ICD-10-CM

## 2018-11-30 DIAGNOSIS — C9002 Multiple myeloma in relapse: Secondary | ICD-10-CM | POA: Diagnosis not present

## 2018-11-30 DIAGNOSIS — Z5112 Encounter for antineoplastic immunotherapy: Secondary | ICD-10-CM | POA: Diagnosis not present

## 2018-11-30 LAB — CBC WITH DIFFERENTIAL (CANCER CENTER ONLY)
Abs Immature Granulocytes: 0.03 10*3/uL (ref 0.00–0.07)
Basophils Absolute: 0.1 10*3/uL (ref 0.0–0.1)
Basophils Relative: 2 %
Eosinophils Absolute: 0.1 10*3/uL (ref 0.0–0.5)
Eosinophils Relative: 2 %
HCT: 42.5 % (ref 39.0–52.0)
Hemoglobin: 14.5 g/dL (ref 13.0–17.0)
Immature Granulocytes: 1 %
Lymphocytes Relative: 30 %
Lymphs Abs: 1.2 10*3/uL (ref 0.7–4.0)
MCH: 36 pg — ABNORMAL HIGH (ref 26.0–34.0)
MCHC: 34.1 g/dL (ref 30.0–36.0)
MCV: 105.5 fL — ABNORMAL HIGH (ref 80.0–100.0)
Monocytes Absolute: 0.9 10*3/uL (ref 0.1–1.0)
Monocytes Relative: 24 %
Neutro Abs: 1.6 10*3/uL — ABNORMAL LOW (ref 1.7–7.7)
Neutrophils Relative %: 41 %
Platelet Count: 120 10*3/uL — ABNORMAL LOW (ref 150–400)
RBC: 4.03 MIL/uL — ABNORMAL LOW (ref 4.22–5.81)
RDW: 14.4 % (ref 11.5–15.5)
WBC Count: 3.8 10*3/uL — ABNORMAL LOW (ref 4.0–10.5)
nRBC: 0 % (ref 0.0–0.2)

## 2018-11-30 LAB — COMPREHENSIVE METABOLIC PANEL
ALT: 16 U/L (ref 0–44)
AST: 12 U/L — ABNORMAL LOW (ref 15–41)
Albumin: 3.8 g/dL (ref 3.5–5.0)
Alkaline Phosphatase: 38 U/L (ref 38–126)
Anion gap: 7 (ref 5–15)
BUN: 20 mg/dL (ref 8–23)
CO2: 26 mmol/L (ref 22–32)
Calcium: 8.4 mg/dL — ABNORMAL LOW (ref 8.9–10.3)
Chloride: 104 mmol/L (ref 98–111)
Creatinine, Ser: 0.89 mg/dL (ref 0.61–1.24)
GFR calc Af Amer: >60 mL/min (ref >60)
GFR calc non Af Amer: >60 mL/min (ref >60)
Glucose, Bld: 138 mg/dL — ABNORMAL HIGH (ref 70–99)
Potassium: 3.9 mmol/L (ref 3.5–5.1)
Sodium: 137 mmol/L (ref 135–145)
Total Bilirubin: 1.3 mg/dL — ABNORMAL HIGH (ref 0.3–1.2)
Total Protein: 5.2 g/dL — ABNORMAL LOW (ref 6.5–8.1)

## 2018-11-30 MED ORDER — PROCHLORPERAZINE MALEATE 10 MG PO TABS
ORAL_TABLET | ORAL | Status: AC
  Filled 2018-11-30: qty 1

## 2018-11-30 MED ORDER — DIPHENHYDRAMINE HCL 25 MG PO CAPS
ORAL_CAPSULE | ORAL | Status: AC
  Filled 2018-11-30: qty 2

## 2018-11-30 MED ORDER — ACETAMINOPHEN 325 MG PO TABS
ORAL_TABLET | ORAL | Status: AC
  Filled 2018-11-30: qty 2

## 2018-11-30 MED ORDER — HEPARIN SOD (PORK) LOCK FLUSH 100 UNIT/ML IV SOLN
500.0000 [IU] | Freq: Once | INTRAVENOUS | Status: AC | PRN
  Administered 2018-11-30: 15:00:00 500 [IU]
  Filled 2018-11-30: qty 5

## 2018-11-30 MED ORDER — SODIUM CHLORIDE 0.9 % IV SOLN
20.0000 mg | Freq: Once | INTRAVENOUS | Status: AC
  Administered 2018-11-30: 13:00:00 20 mg via INTRAVENOUS
  Filled 2018-11-30: qty 2

## 2018-11-30 MED ORDER — SODIUM CHLORIDE 0.9 % IV SOLN
Freq: Once | INTRAVENOUS | Status: AC
  Administered 2018-11-30: 12:00:00 via INTRAVENOUS
  Filled 2018-11-30: qty 250

## 2018-11-30 MED ORDER — PROCHLORPERAZINE MALEATE 10 MG PO TABS
10.0000 mg | ORAL_TABLET | Freq: Once | ORAL | Status: AC
  Administered 2018-11-30: 10 mg via ORAL

## 2018-11-30 MED ORDER — SODIUM CHLORIDE 0.9 % IV SOLN
Freq: Once | INTRAVENOUS | Status: AC
  Administered 2018-11-30: 13:00:00 via INTRAVENOUS
  Filled 2018-11-30: qty 250

## 2018-11-30 MED ORDER — DIPHENHYDRAMINE HCL 25 MG PO CAPS
50.0000 mg | ORAL_CAPSULE | Freq: Once | ORAL | Status: AC
  Administered 2018-11-30: 50 mg via ORAL

## 2018-11-30 MED ORDER — SODIUM CHLORIDE 0.9% FLUSH
10.0000 mL | INTRAVENOUS | Status: DC | PRN
  Administered 2018-11-30: 10 mL
  Filled 2018-11-30: qty 10

## 2018-11-30 MED ORDER — DEXTROSE 5 % IV SOLN
71.0000 mg/m2 | Freq: Once | INTRAVENOUS | Status: AC
  Administered 2018-11-30: 150 mg via INTRAVENOUS
  Filled 2018-11-30: qty 60

## 2018-11-30 MED ORDER — ACETAMINOPHEN 325 MG PO TABS
650.0000 mg | ORAL_TABLET | Freq: Once | ORAL | Status: AC
  Administered 2018-11-30: 13:00:00 650 mg via ORAL

## 2018-11-30 NOTE — Progress Notes (Signed)
CM

## 2018-11-30 NOTE — Patient Instructions (Signed)
Tunneled Central Venous Catheter Flushing Guide  It is important to flush your tunneled central venous catheter each time you use it, both before and after you use it. Flushing your catheter will help prevent it from clogging. What are the risks? Risks may include:  Infection.  Air getting into the catheter and bloodstream. Supplies needed:  A clean pair of gloves.  A disinfecting wipe. Use an alcohol wipe, chlorhexidine wipe, or iodine wipe as told by your health care provider.  A 10 mL syringe that has been prefilled with saline solution.  An empty 10 mL syringe, if a substance called heparin was injected into your catheter. How to flush your catheter When you flush your catheter, make sure you follow any specific instructions from your health care provider or the manufacturer. These are general guidelines. Flushing your catheter before use If there is heparin in your catheter: 1. Wash your hands with soap and water. 2. Put on gloves. 3. Scrub the injection cap for a minimum of 15 seconds with a disinfecting wipe. 4. Unclamp the catheter. 5. Attach the empty syringe to the injection cap. 6. Pull the syringe plunger back and withdraw 10 mL of blood. 7. Place the syringe into an appropriate waste container. 8. Scrub the injection cap for 15 seconds with a disinfecting wipe. 9. Attach the prefilled syringe to the injection cap. 10. Flush the catheter by pushing the plunger forward until all the liquid from the syringe is in the catheter. 11. Remove the syringe from the injection cap. 12. Clamp the catheter. If there is no heparin in your catheter: 1. Wash your hands with soap and water. 2. Put on gloves. 3. Scrub the injection cap for 15 seconds with a disinfecting wipe. 4. Unclamp the catheter. 5. Attach the prefilled syringe to the injection cap. 6. Flush the catheter by pushing the plunger forward until 5 mL of the liquid from the syringe is in the catheter. 7. Pull back on  the syringe until you see blood in the catheter. 8. If you have been asked to collect any blood, follow your health care provider's instructions. Otherwise, flush the catheter with the rest of the solution from the syringe. 9. Remove the syringe from the injection cap. 10. Clamp the catheter.  Flushing your catheter after use 1. Wash your hands with soap and water. 2. Put on gloves. 3. Scrub the injection cap for 15 seconds with a disinfecting wipe. 4. Unclamp the catheter. 5. Attach the prefilled syringe to the injection cap. 6. Flush the catheter by pushing the plunger forward until all of the liquid from the syringe is in the catheter. 7. Remove the syringe from the injection cap. 8. Clamp the catheter. Problems and solutions  If blood cannot be completely cleared from the injection cap, you may need to have the injection cap replaced.  If the catheter is difficult to flush, use the pulsing method. The pulsing method involves pushing only a few milliliters of solution into the catheter at a time and pausing between pushes.  If you do not see blood in the catheter when you pull back on the syringe, change your body position, such as by raising your arms above your head. Take a deep breath and cough. Then, pull back on the syringe. If you still do not see blood, flush the catheter with a small amount of solution. Then, change positions again and take a breath or cough. Pull back on the syringe again. If you still do not see   blood, finish flushing the catheter and contact your health care provider. Do not use your catheter until your health care provider says it is okay. General tips  Have someone help you flush your catheter, if possible.  Do not force fluid through your catheter.  Do not use a syringe that is larger or smaller than 10 mL. Using a smaller syringe can make the catheter burst.  Do not use your catheter without flushing it first if it has heparin in it. Contact a health  care provider if:  You cannot see any blood in the catheter when you flush it before using it.  Your catheter is difficult to flush. Get help right away if:  You cannot flush the catheter.  The catheter leaks when you flush it or when there is fluid in it.  There are cracks or breaks in the catheter. Summary  It is important to flush your tunneled central venous catheter each time you use it, both before and after you use it.  Scrub the injection cap for 15 seconds with a disinfecting wipe before and after you flush it.  When you flush your catheter, make sure you follow any specific instructions from your health care provider or the manufacturer.  Get help right away if you cannot flush the catheter. This information is not intended to replace advice given to you by your health care provider. Make sure you discuss any questions you have with your health care provider. Document Released: 05/02/2011 Document Revised: 07/29/2018 Document Reviewed: 07/29/2018 Elsevier Patient Education  2020 [employer]  

## 2018-12-02 DIAGNOSIS — H6123 Impacted cerumen, bilateral: Secondary | ICD-10-CM | POA: Diagnosis not present

## 2018-12-04 ENCOUNTER — Other Ambulatory Visit: Payer: Self-pay | Admitting: *Deleted

## 2018-12-04 DIAGNOSIS — C9000 Multiple myeloma not having achieved remission: Secondary | ICD-10-CM

## 2018-12-04 DIAGNOSIS — S91332A Puncture wound without foreign body, left foot, initial encounter: Secondary | ICD-10-CM

## 2018-12-04 MED ORDER — POMALIDOMIDE 4 MG PO CAPS: 4.0000 mg | ORAL_CAPSULE | Freq: Every day | ORAL | 0 refills | Status: DC

## 2018-12-07 ENCOUNTER — Inpatient Hospital Stay: Payer: Medicare Other

## 2018-12-07 ENCOUNTER — Other Ambulatory Visit: Payer: Medicare Other

## 2018-12-07 ENCOUNTER — Other Ambulatory Visit: Payer: Self-pay

## 2018-12-07 ENCOUNTER — Telehealth: Payer: Self-pay | Admitting: *Deleted

## 2018-12-07 DIAGNOSIS — Z5112 Encounter for antineoplastic immunotherapy: Secondary | ICD-10-CM | POA: Diagnosis not present

## 2018-12-07 DIAGNOSIS — C9000 Multiple myeloma not having achieved remission: Secondary | ICD-10-CM

## 2018-12-07 DIAGNOSIS — Z20822 Contact with and (suspected) exposure to covid-19: Secondary | ICD-10-CM

## 2018-12-07 DIAGNOSIS — C9002 Multiple myeloma in relapse: Secondary | ICD-10-CM | POA: Diagnosis not present

## 2018-12-07 DIAGNOSIS — R6889 Other general symptoms and signs: Secondary | ICD-10-CM | POA: Diagnosis not present

## 2018-12-07 LAB — CMP (CANCER CENTER ONLY)
ALT: 19 U/L (ref 0–44)
AST: 14 U/L — ABNORMAL LOW (ref 15–41)
Albumin: 3.7 g/dL (ref 3.5–5.0)
Alkaline Phosphatase: 36 U/L — ABNORMAL LOW (ref 38–126)
Anion gap: 8 (ref 5–15)
BUN: 16 mg/dL (ref 8–23)
CO2: 25 mmol/L (ref 22–32)
Calcium: 8.2 mg/dL — ABNORMAL LOW (ref 8.9–10.3)
Chloride: 106 mmol/L (ref 98–111)
Creatinine: 0.93 mg/dL (ref 0.61–1.24)
GFR, Est AFR Am: >60 mL/min (ref >60)
GFR, Estimated: >60 mL/min (ref >60)
Glucose, Bld: 177 mg/dL — ABNORMAL HIGH (ref 70–99)
Potassium: 4.1 mmol/L (ref 3.5–5.1)
Sodium: 139 mmol/L (ref 135–145)
Total Bilirubin: 1.2 mg/dL (ref 0.3–1.2)
Total Protein: 5 g/dL — ABNORMAL LOW (ref 6.5–8.1)

## 2018-12-07 LAB — CBC WITH DIFFERENTIAL (CANCER CENTER ONLY)
Abs Immature Granulocytes: 0.12 10*3/uL — ABNORMAL HIGH (ref 0.00–0.07)
Basophils Absolute: 0 10*3/uL (ref 0.0–0.1)
Basophils Relative: 1 %
Eosinophils Absolute: 0.2 10*3/uL (ref 0.0–0.5)
Eosinophils Relative: 4 %
HCT: 42.2 % (ref 39.0–52.0)
Hemoglobin: 14.3 g/dL (ref 13.0–17.0)
Immature Granulocytes: 3 %
Lymphocytes Relative: 24 %
Lymphs Abs: 1 10*3/uL (ref 0.7–4.0)
MCH: 35.8 pg — ABNORMAL HIGH (ref 26.0–34.0)
MCHC: 33.9 g/dL (ref 30.0–36.0)
MCV: 105.5 fL — ABNORMAL HIGH (ref 80.0–100.0)
Monocytes Absolute: 0.7 10*3/uL (ref 0.1–1.0)
Monocytes Relative: 18 %
Neutro Abs: 2.2 10*3/uL (ref 1.7–7.7)
Neutrophils Relative %: 50 %
Platelet Count: 131 10*3/uL — ABNORMAL LOW (ref 150–400)
RBC: 4 MIL/uL — ABNORMAL LOW (ref 4.22–5.81)
RDW: 14.5 % (ref 11.5–15.5)
WBC Count: 4.2 10*3/uL (ref 4.0–10.5)
nRBC: 0 % (ref 0.0–0.2)

## 2018-12-07 LAB — LACTATE DEHYDROGENASE: LDH: 202 U/L — ABNORMAL HIGH (ref 98–192)

## 2018-12-07 MED ORDER — SODIUM CHLORIDE 0.9 % IV SOLN
Freq: Once | INTRAVENOUS | Status: DC
  Filled 2018-12-07: qty 250

## 2018-12-07 MED ORDER — ACETAMINOPHEN 325 MG PO TABS
650.0000 mg | ORAL_TABLET | Freq: Once | ORAL | Status: AC
  Administered 2018-12-07: 650 mg via ORAL

## 2018-12-07 MED ORDER — HEPARIN SOD (PORK) LOCK FLUSH 100 UNIT/ML IV SOLN
500.0000 [IU] | Freq: Once | INTRAVENOUS | Status: AC | PRN
  Administered 2018-12-07: 500 [IU]
  Filled 2018-12-07: qty 5

## 2018-12-07 MED ORDER — DIPHENHYDRAMINE HCL 25 MG PO CAPS
ORAL_CAPSULE | ORAL | Status: AC
  Filled 2018-12-07: qty 2

## 2018-12-07 MED ORDER — PROCHLORPERAZINE MALEATE 10 MG PO TABS
ORAL_TABLET | ORAL | Status: AC
  Filled 2018-12-07: qty 1

## 2018-12-07 MED ORDER — DEXTROSE 5 % IV SOLN
71.0000 mg/m2 | Freq: Once | INTRAVENOUS | Status: AC
  Administered 2018-12-07: 150 mg via INTRAVENOUS
  Filled 2018-12-07: qty 60

## 2018-12-07 MED ORDER — ACETAMINOPHEN 325 MG PO TABS
ORAL_TABLET | ORAL | Status: AC
  Filled 2018-12-07: qty 2

## 2018-12-07 MED ORDER — SODIUM CHLORIDE 0.9 % IV SOLN
Freq: Once | INTRAVENOUS | Status: AC
  Administered 2018-12-07: 12:00:00 via INTRAVENOUS
  Filled 2018-12-07: qty 250

## 2018-12-07 MED ORDER — DIPHENHYDRAMINE HCL 25 MG PO CAPS
50.0000 mg | ORAL_CAPSULE | Freq: Once | ORAL | Status: AC
  Administered 2018-12-07: 50 mg via ORAL

## 2018-12-07 MED ORDER — SODIUM CHLORIDE 0.9% FLUSH
10.0000 mL | INTRAVENOUS | Status: DC | PRN
  Administered 2018-12-07: 10 mL
  Filled 2018-12-07: qty 10

## 2018-12-07 MED ORDER — PROCHLORPERAZINE MALEATE 10 MG PO TABS
10.0000 mg | ORAL_TABLET | Freq: Once | ORAL | Status: AC
  Administered 2018-12-07: 10 mg via ORAL

## 2018-12-07 MED ORDER — SODIUM CHLORIDE 0.9 % IV SOLN
20.0000 mg | Freq: Once | INTRAVENOUS | Status: AC
  Administered 2018-12-07: 20 mg via INTRAVENOUS
  Filled 2018-12-07: qty 2

## 2018-12-07 NOTE — Telephone Encounter (Signed)
Spoke with patient, scheduled him for COVID 19 test today at [facility] at 3:30 pm.  Testing protocol reviewed. Sent a note to his wife's provider requesting approval for her to get tested as well.

## 2018-12-07 NOTE — Patient Instructions (Signed)

## 2018-12-07 NOTE — Telephone Encounter (Signed)
-----   Message from Johny Drilling, RN sent at 12/07/2018 10:48 AM EDT ----- Hello friends!   [name] presented to [facility] today for chemo as scheduled. After screening and being checked in as usual, pt got a phone call that his dentist tested positive for COVID Saturday. Pt had a filling repaired by this dentist on Thursday. Please contact Rickey [name] for testing today. He will be at the cancer center for a couple hours. His wife Rickey [name] would also like to be tested. Rickey [name] cell # is [phone].   Thank you so much!   DHarris, RN

## 2018-12-08 LAB — PROTEIN ELECTROPHORESIS, SERUM, WITH REFLEX
A/G Ratio: 2.2 — ABNORMAL HIGH (ref 0.7–1.7)
Albumin ELP: 3.3 g/dL (ref 2.9–4.4)
Alpha-1-Globulin: 0.2 g/dL (ref 0.0–0.4)
Alpha-2-Globulin: 0.5 g/dL (ref 0.4–1.0)
Beta Globulin: 0.6 g/dL — ABNORMAL LOW (ref 0.7–1.3)
Gamma Globulin: 0.2 g/dL — ABNORMAL LOW (ref 0.4–1.8)
Globulin, Total: 1.5 g/dL — ABNORMAL LOW (ref 2.2–3.9)
Total Protein ELP: 4.8 g/dL — ABNORMAL LOW (ref 6.0–8.5)

## 2018-12-08 LAB — IGG, IGA, IGM
IgA: 9 mg/dL — ABNORMAL LOW (ref 61–437)
IgG (Immunoglobin G), Serum: 192 mg/dL — ABNORMAL LOW (ref 603–1613)
IgM (Immunoglobulin M), Srm: <5 mg/dL — ABNORMAL LOW (ref 15–143)

## 2018-12-08 LAB — KAPPA/LAMBDA LIGHT CHAINS
Kappa free light chain: 32.3 mg/L — ABNORMAL HIGH (ref 3.3–19.4)
Kappa, lambda light chain ratio: >21.53 — ABNORMAL HIGH (ref 0.26–1.65)
Lambda free light chains: <1.5 mg/L — ABNORMAL LOW (ref 5.7–26.3)

## 2018-12-11 LAB — NOVEL CORONAVIRUS, NAA: SARS-CoV-2, NAA: NOT DETECTED

## 2018-12-21 ENCOUNTER — Encounter: Payer: Self-pay | Admitting: Hematology & Oncology

## 2018-12-21 ENCOUNTER — Inpatient Hospital Stay (HOSPITAL_BASED_OUTPATIENT_CLINIC_OR_DEPARTMENT_OTHER): Payer: Medicare Other | Admitting: Hematology & Oncology

## 2018-12-21 ENCOUNTER — Inpatient Hospital Stay: Payer: Medicare Other

## 2018-12-21 ENCOUNTER — Other Ambulatory Visit: Payer: Medicare Other

## 2018-12-21 ENCOUNTER — Other Ambulatory Visit: Payer: Self-pay | Admitting: *Deleted

## 2018-12-21 ENCOUNTER — Other Ambulatory Visit: Payer: Self-pay

## 2018-12-21 VITALS — BP 118/66 | HR 67 | Temp 97.6°F | Resp 20 | Wt 205.0 lb

## 2018-12-21 DIAGNOSIS — C9000 Multiple myeloma not having achieved remission: Secondary | ICD-10-CM

## 2018-12-21 DIAGNOSIS — Z5112 Encounter for antineoplastic immunotherapy: Secondary | ICD-10-CM | POA: Diagnosis not present

## 2018-12-21 DIAGNOSIS — C9002 Multiple myeloma in relapse: Secondary | ICD-10-CM | POA: Diagnosis not present

## 2018-12-21 DIAGNOSIS — Z7189 Other specified counseling: Secondary | ICD-10-CM

## 2018-12-21 DIAGNOSIS — D801 Nonfamilial hypogammaglobulinemia: Secondary | ICD-10-CM | POA: Insufficient documentation

## 2018-12-21 HISTORY — DX: Other specified counseling: Z71.89

## 2018-12-21 HISTORY — DX: Nonfamilial hypogammaglobulinemia: D80.1

## 2018-12-21 LAB — CBC WITH DIFFERENTIAL (CANCER CENTER ONLY)
Abs Immature Granulocytes: 0.01 10*3/uL (ref 0.00–0.07)
Basophils Absolute: 0.1 10*3/uL (ref 0.0–0.1)
Basophils Relative: 2 %
Eosinophils Absolute: 0.1 10*3/uL (ref 0.0–0.5)
Eosinophils Relative: 3 %
HCT: 42.6 % (ref 39.0–52.0)
Hemoglobin: 14.4 g/dL (ref 13.0–17.0)
Immature Granulocytes: 0 %
Lymphocytes Relative: 28 %
Lymphs Abs: 1 10*3/uL (ref 0.7–4.0)
MCH: 36.3 pg — ABNORMAL HIGH (ref 26.0–34.0)
MCHC: 33.8 g/dL (ref 30.0–36.0)
MCV: 107.3 fL — ABNORMAL HIGH (ref 80.0–100.0)
Monocytes Absolute: 0.7 10*3/uL (ref 0.1–1.0)
Monocytes Relative: 20 %
Neutro Abs: 1.7 10*3/uL (ref 1.7–7.7)
Neutrophils Relative %: 47 %
Platelet Count: 150 10*3/uL (ref 150–400)
RBC: 3.97 MIL/uL — ABNORMAL LOW (ref 4.22–5.81)
RDW: 13.9 % (ref 11.5–15.5)
WBC Count: 3.6 10*3/uL — ABNORMAL LOW (ref 4.0–10.5)
nRBC: 0 % (ref 0.0–0.2)

## 2018-12-21 LAB — CMP (CANCER CENTER ONLY)
ALT: 19 U/L (ref 0–44)
AST: 14 U/L — ABNORMAL LOW (ref 15–41)
Albumin: 3.6 g/dL (ref 3.5–5.0)
Alkaline Phosphatase: 34 U/L — ABNORMAL LOW (ref 38–126)
Anion gap: 6 (ref 5–15)
BUN: 24 mg/dL — ABNORMAL HIGH (ref 8–23)
CO2: 24 mmol/L (ref 22–32)
Calcium: 7.6 mg/dL — ABNORMAL LOW (ref 8.9–10.3)
Chloride: 108 mmol/L (ref 98–111)
Creatinine: 0.98 mg/dL (ref 0.61–1.24)
GFR, Est AFR Am: >60 mL/min (ref >60)
GFR, Estimated: >60 mL/min (ref >60)
Glucose, Bld: 157 mg/dL — ABNORMAL HIGH (ref 70–99)
Potassium: 4 mmol/L (ref 3.5–5.1)
Sodium: 138 mmol/L (ref 135–145)
Total Bilirubin: 1.2 mg/dL (ref 0.3–1.2)
Total Protein: 4.9 g/dL — ABNORMAL LOW (ref 6.5–8.1)

## 2018-12-21 MED ORDER — PROCHLORPERAZINE MALEATE 10 MG PO TABS
10.0000 mg | ORAL_TABLET | Freq: Once | ORAL | Status: AC
  Administered 2018-12-21: 10 mg via ORAL

## 2018-12-21 MED ORDER — ACETAMINOPHEN 325 MG PO TABS
ORAL_TABLET | ORAL | Status: AC
  Filled 2018-12-21: qty 2

## 2018-12-21 MED ORDER — DIPHENHYDRAMINE HCL 25 MG PO CAPS
50.0000 mg | ORAL_CAPSULE | Freq: Once | ORAL | Status: AC
  Administered 2018-12-21: 50 mg via ORAL

## 2018-12-21 MED ORDER — SODIUM CHLORIDE 0.9 % IV SOLN
Freq: Once | INTRAVENOUS | Status: AC
  Administered 2018-12-21: 13:00:00 via INTRAVENOUS
  Filled 2018-12-21: qty 250

## 2018-12-21 MED ORDER — ACETAMINOPHEN 325 MG PO TABS
650.0000 mg | ORAL_TABLET | Freq: Once | ORAL | Status: AC
  Administered 2018-12-21: 650 mg via ORAL

## 2018-12-21 MED ORDER — SODIUM CHLORIDE 0.9% FLUSH
10.0000 mL | INTRAVENOUS | Status: DC | PRN
  Administered 2018-12-21: 10 mL
  Filled 2018-12-21: qty 10

## 2018-12-21 MED ORDER — DEXTROSE 5 % IV SOLN
71.0000 mg/m2 | Freq: Once | INTRAVENOUS | Status: AC
  Administered 2018-12-21: 150 mg via INTRAVENOUS
  Filled 2018-12-21: qty 60

## 2018-12-21 MED ORDER — PROCHLORPERAZINE MALEATE 10 MG PO TABS
ORAL_TABLET | ORAL | Status: AC
  Filled 2018-12-21: qty 1

## 2018-12-21 MED ORDER — DIPHENHYDRAMINE HCL 25 MG PO CAPS
ORAL_CAPSULE | ORAL | Status: AC
  Filled 2018-12-21: qty 2

## 2018-12-21 MED ORDER — HEPARIN SOD (PORK) LOCK FLUSH 100 UNIT/ML IV SOLN
500.0000 [IU] | Freq: Once | INTRAVENOUS | Status: AC | PRN
  Administered 2018-12-21: 500 [IU]
  Filled 2018-12-21: qty 5

## 2018-12-21 MED ORDER — SODIUM CHLORIDE 0.9 % IV SOLN
20.0000 mg | Freq: Once | INTRAVENOUS | Status: AC
  Administered 2018-12-21: 20 mg via INTRAVENOUS
  Filled 2018-12-21: qty 2

## 2018-12-21 NOTE — Progress Notes (Signed)
Hematology and Oncology Follow Up Visit  Rickey [name] [id number] [birth year] 78 y.o. 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 Elotuzumab/Pomalidomide/decadron -every 4 weeks- start 01/12/2018 Kyprolis/Pomalidomide/Decadron -- s/p cycle #4 -- start 04/06/2018    Interim History:  Rickey [name] is here today for follow-up.  He does feel tired.  He thinks that the pomalidomide might be causing this.  He wants to stop the pomalidomide right now.  If I stop it for a month.  I would have no problems with him doing this.  Another issue is that his immunoglobulin levels are incredibly low.  His IgG level is only 192 mg/dL.  I think that he probably is going to need supplemental IVIG.  I taught him about this.  I explained why I thought IVIG would be helpful with respect to his immune system.  He is in agreement.  His last kappa light chain level was down to 3.2 mg/dL.  I think at this level, he will never have a problem.  His blood sugars are doing quite well.  He is on the insulin pump.  He has had no problems with bowels or bladder.  He has had no cough.  There is been no rashes.  Overall, his performance status is ECOG 1.      Medications:  Allergies as of 12/21/2018   No Known Allergies     Medication List       Accurate as of December 21, 2018 12:32 PM. If you have any questions, ask your nurse or doctor.        acyclovir 400 MG tablet Commonly known as: ZOVIRAX TAKE 1 TABLET BY MOUTH TWICE DAILY   aspirin 81 MG tablet Take 81 mg by mouth daily.   atorvastatin 10 MG tablet Commonly known as: LIPITOR Take 10 mg by mouth daily.   calcium-vitamin D 250-125 MG-UNIT tablet Commonly known as: OSCAL Take 1 tablet  by mouth daily.   cycloSPORINE 0.05 % ophthalmic emulsion Commonly known as: RESTASIS Place 1 drop into both eyes 2 (two) times daily.   fish oil-omega-3 fatty acids 1000 MG capsule Take 1 g by mouth 2 (two) times daily.   INSULIN LISP & LISP PROT (HUM) [place]   insulin lispro 100 UNIT/ML injection Commonly known as: HUMALOG Inject into the skin. Insulin pump 12/21/2018 Typically 80U daily.   irbesartan-hydrochlorothiazide 300-12.5 MG tablet Commonly known as: AVALIDE Take 1 tablet by mouth daily.   lidocaine-prilocaine cream Commonly known as: EMLA Apply 1 application topically as needed.   montelukast 10 MG tablet Commonly known as: SINGULAIR Take 10 mg by mouth at bedtime.   Myrbetriq 50 MG Tb24 tablet Generic drug: mirabegron ER Take 50 mg by mouth daily.   ondansetron 4 MG disintegrating tablet Commonly known as: ZOFRAN-ODT Take 4 mg by mouth 4 (four) times daily as needed for nausea or vomiting.   pomalidomide 4 MG capsule Commonly known as: POMALYST Take 1 capsule (4 mg total) by mouth daily. Take with water on days 1-21. Repeat every 28 days. [id number]   Vitamin D 50 MCG (2000 UT) Caps Take by mouth every morning.       Allergies: No Known Allergies  Past Medical History, Surgical history,  Social history, and Family History were reviewed and updated.  Review of Systems: Review of Systems  Constitutional: Negative.   HENT: Negative.   Eyes: Negative.   Respiratory: Negative.   Cardiovascular: Negative.   Gastrointestinal: Negative.   Genitourinary: Negative.   Musculoskeletal: Negative.   Skin: Negative.   Neurological: Negative.   Endo/Heme/Allergies: Negative.   Psychiatric/Behavioral: Negative.      Physical Exam:  weight is 205 lb (93 kg). His oral temperature is 97.6 F (36.4 C). His blood pressure is 118/66 and his pulse is 67. His respiration is 20 and oxygen saturation is 100%.   Wt Readings from Last 3 Encounters:  12/21/18 205 lb  (93 kg)  11/09/18 206 lb 1.3 oz (93.5 kg)  10/26/18 205 lb (93 kg)    Physical Exam Vitals signs reviewed.  HENT:     Head: Normocephalic and atraumatic.  Eyes:     Pupils: Pupils are equal, round, and reactive to light.  Neck:     Musculoskeletal: Normal range of motion.  Cardiovascular:     Rate and Rhythm: Normal rate and regular rhythm.     Heart sounds: Normal heart sounds.  Pulmonary:     Effort: Pulmonary effort is normal.     Breath sounds: Normal breath sounds.  Abdominal:     General: Bowel sounds are normal.     Palpations: Abdomen is soft.  Musculoskeletal: Normal range of motion.        General: No tenderness or deformity.  Lymphadenopathy:     Cervical: No cervical adenopathy.  Skin:    General: Skin is warm and dry.     Findings: No erythema or rash.  Neurological:     Mental Status: He is alert and oriented to person, place, and time.  Psychiatric:        Behavior: Behavior normal.        Thought Content: Thought content normal.        Judgment: Judgment normal.      Lab Results  Component Value Date   WBC 3.6 (L) 12/21/2018   HGB 14.4 12/21/2018   HCT 42.6 12/21/2018   MCV 107.3 (H) 12/21/2018   PLT 150 12/21/2018   No results found for: FERRITIN, IRON, TIBC, UIBC, IRONPCTSAT Lab Results  Component Value Date   RBC 3.97 (L) 12/21/2018   Lab Results  Component Value Date   KPAFRELGTCHN 32.3 (H) 12/07/2018   LAMBDASER <1.5 (L) 12/07/2018   KAPLAMBRATIO >21.53 (H) 12/07/2018   Lab Results  Component Value Date   IGGSERUM 192 (L) 12/07/2018   IGA 9 (L) 12/07/2018   IGMSERUM <5 (L) 12/07/2018   Lab Results  Component Value Date   TOTALPROTELP 4.8 (L) 12/07/2018   ALBUMINELP 3.3 12/07/2018   A1GS 0.2 12/07/2018   A2GS 0.5 12/07/2018   BETS 0.6 (L) 12/07/2018   BETA2SER 0.3 04/19/2015   GAMS 0.2 (L) 12/07/2018   MSPIKE Not Observed 12/07/2018   SPEI Comment 11/23/2018     Chemistry      Component Value Date/Time   NA 138  12/21/2018 1103   NA 142 04/23/2017 1431   NA 139 07/18/2016 1135   K 4.0 12/21/2018 1103   K 4.0 04/23/2017 1431   K 4.4 07/18/2016 1135   CL 108 12/21/2018 1103   CL 105 04/23/2017 1431   CO2 24 12/21/2018 1103   CO2 28 04/23/2017 1431   CO2 27 07/18/2016 1135   BUN 24 (H) 12/21/2018 1103   BUN 20  04/23/2017 1431   BUN 12.2 07/18/2016 1135   CREATININE 0.98 12/21/2018 1103   CREATININE 1.2 04/23/2017 1431   CREATININE 0.9 07/18/2016 1135      Component Value Date/Time   CALCIUM 7.6 (L) 12/21/2018 1103   CALCIUM 9.3 04/23/2017 1431   CALCIUM 8.9 07/18/2016 1135   ALKPHOS 34 (L) 12/21/2018 1103   ALKPHOS 64 04/23/2017 1431   ALKPHOS 58 07/18/2016 1135   AST 14 (L) 12/21/2018 1103   AST 15 07/18/2016 1135   ALT 19 12/21/2018 1103   ALT 22 04/23/2017 1431   ALT 20 07/18/2016 1135   BILITOT 1.2 12/21/2018 1103   BILITOT 1.24 (H) 07/18/2016 1135      Impression and Plan: Mr. 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   I think that he is in a "good place" right now with respect to the light chain myeloma.  I think that the levels are quite low and that he would not be affected by this.  I do think that he would be affected by the IgG level being so low.  He definitely will benefit from the supplemental IgG given via IVIG.  I think this would be a good idea for him.  We will plan to see him back in other 4 weeks.  This is a little bit complicated.  He does have issues that we have to address.  We had to put in the protocol for the IVIG.  The 35 minutes.     [name], MD 7/27/202012:32 PM

## 2018-12-21 NOTE — Patient Instructions (Signed)
Implanted Port Insertion, Care After This sheet gives you information about how to care for yourself after your procedure. Your health care provider may also give you more specific instructions. If you have problems or questions, contact your health care provider. What can I expect after the procedure? After the procedure, it is common to have:  Discomfort at the port insertion site.  Bruising on the skin over the port. This should improve over 3-4 days. Follow these instructions at home: Port care  After your port is placed, you will get a manufacturer's information card. The card has information about your port. Keep this card with you at all times.  Take care of the port as told by your health care provider. Ask your health care provider if you or a family member can get training for taking care of the port at home. A home health care nurse may also take care of the port.  Make sure to remember what type of port you have. Incision care      Follow instructions from your health care provider about how to take care of your port insertion site. Make sure you: ? Wash your hands with soap and water before and after you change your bandage (dressing). If soap and water are not available, use hand sanitizer. ? Change your dressing as told by your health care provider. ? Leave stitches (sutures), skin glue, or adhesive strips in place. These skin closures may need to stay in place for 2 weeks or longer. If adhesive strip edges start to loosen and curl up, you may trim the loose edges. Do not remove adhesive strips completely unless your health care provider tells you to do that.  Check your port insertion site every day for signs of infection. Check for: ? Redness, swelling, or pain. ? Fluid or blood. ? Warmth. ? Pus or a bad smell. Activity  Return to your normal activities as told by your health care provider. Ask your health care provider what activities are safe for you.  Do not  lift anything that is heavier than 10 lb (4.5 kg), or the limit that you are told, until your health care provider says that it is safe. General instructions  Take over-the-counter and prescription medicines only as told by your health care provider.  Do not take baths, swim, or use a hot tub until your health care provider approves. Ask your health care provider if you may take showers. You may only be allowed to take sponge baths.  Do not drive for 24 hours if you were given a sedative during your procedure.  Wear a medical alert bracelet in case of an emergency. This will tell any health care providers that you have a port.  Keep all follow-up visits as told by your health care provider. This is important. Contact a health care provider if:  You cannot flush your port with saline as directed, or you cannot draw blood from the port.  You have a fever or chills.  You have redness, swelling, or pain around your port insertion site.  You have fluid or blood coming from your port insertion site.  Your port insertion site feels warm to the touch.  You have pus or a bad smell coming from the port insertion site. Get help right away if:  You have chest pain or shortness of breath.  You have bleeding from your port that you cannot control. Summary  Take care of the port as told by your health   care provider. Keep the manufacturer's information card with you at all times.  Change your dressing as told by your health care provider.  Contact a health care provider if you have a fever or chills or if you have redness, swelling, or pain around your port insertion site.  Keep all follow-up visits as told by your health care provider. This information is not intended to replace advice given to you by your health care provider. Make sure you discuss any questions you have with your health care provider. Document Released: 03/03/2013 Document Revised: 12/09/2017 Document Reviewed: 12/09/2017  Elsevier Patient Education  2020 [employer]  

## 2018-12-21 NOTE — Patient Instructions (Signed)
Carfilzomib injection What is this medicine? CARFILZOMIB (kar FILZ oh mib) targets a specific protein within cancer cells and stops the cancer cells from growing. It is used to treat multiple myeloma. This medicine may be used for other purposes; ask your health care provider or pharmacist if you have questions. COMMON BRAND NAME(S): KYPROLIS What should I tell my health care provider before I take this medicine? They need to know if you have any of these conditions:  heart disease  history of blood clots  irregular heartbeat  kidney disease  liver disease  lung or breathing disease  an unusual or allergic reaction to carfilzomib, or other medicines, foods, dyes, or preservatives  pregnant or trying to get pregnant  breast-feeding How should I use this medicine? This medicine is for injection or infusion into a vein. It is given by a health care professional in a hospital or clinic setting. Talk to your pediatrician regarding the use of this medicine in children. Special care may be needed. Overdosage: If you think you have taken too much of this medicine contact a poison control center or emergency room at once. NOTE: This medicine is only for you. Do not share this medicine with others. What if I miss a dose? It is important not to miss your dose. Call your doctor or health care professional if you are unable to keep an appointment. What may interact with this medicine? Interactions are not expected. Give your health care provider a list of all the medicines, herbs, non-prescription drugs, or dietary supplements you use. Also tell them if you smoke, drink alcohol, or use illegal drugs. Some items may interact with your medicine. This list may not describe all possible interactions. Give your health care provider a list of all the medicines, herbs, non-prescription drugs, or dietary supplements you use. Also tell them if you smoke, drink alcohol, or use illegal drugs. Some items  may interact with your medicine. What should I watch for while using this medicine? Your condition will be monitored carefully while you are receiving this medicine. Report any side effects. Continue your course of treatment even though you feel ill unless your doctor tells you to stop. You may need blood work done while you are taking this medicine. Do not become pregnant while taking this medicine or for at least 6 months after stopping it. Women should inform their doctor if they wish to become pregnant or think they might be pregnant. There is a potential for serious side effects to an unborn child. Men should not father a child while taking this medicine and for at least 3 months after stopping it. Talk to your health care professional or pharmacist for more information. Do not breast-feed an infant while taking this medicine or for 2 weeks after the last dose. Check with your doctor or health care professional if you get an attack of severe diarrhea, nausea and vomiting, or if you sweat a lot. The loss of too much body fluid can make it dangerous for you to take this medicine. You may get dizzy. Do not drive, use machinery, or do anything that needs mental alertness until you know how this medicine affects you. Do not stand or sit up quickly, especially if you are an older patient. This reduces the risk of dizzy or fainting spells. What side effects may I notice from receiving this medicine? Side effects that you should report to your doctor or health care professional as soon as possible:  allergic reactions like skin  rash, itching or hives, swelling of the face, lips, or tongue  confusion  dizziness  feeling faint or lightheaded  fever or chills  palpitations  seizures  signs and symptoms of bleeding such as bloody or black, tarry stools; red or dark-brown urine; spitting up blood or brown material that looks like coffee grounds; red spots on the skin; unusual bruising or bleeding  including from the eye, gums, or nose  signs and symptoms of a blood clot such as breathing problems; changes in vision; chest pain; severe, sudden headache; pain, swelling, warmth in the leg; trouble speaking; sudden numbness or weakness of the face, arm or leg  signs and symptoms of kidney injury like trouble passing urine or change in the amount of urine  signs and symptoms of liver injury like dark yellow or brown urine; general ill feeling or flu-like symptoms; light-colored stools; loss of appetite; nausea; right upper belly pain; unusually weak or tired; yellowing of the eyes or skin Side effects that usually do not require medical attention (report to your doctor or health care professional if they continue or are bothersome):  back pain  cough  diarrhea  headache  muscle cramps  vomiting This list may not describe all possible side effects. Call your doctor for medical advice about side effects. You may report side effects to FDA at 1-800-FDA-1088. Where should I keep my medicine? This drug is given in a hospital or clinic and will not be stored at home. NOTE: This sheet is a summary. It may not cover all possible information. If you have questions about this medicine, talk to your doctor, pharmacist, or health care provider.  2020 Elsevier/Gold Standard (2017-02-26 14:07:13)  

## 2018-12-22 LAB — IGG, IGA, IGM
IgA: 11 mg/dL — ABNORMAL LOW (ref 61–437)
IgG (Immunoglobin G), Serum: 175 mg/dL — ABNORMAL LOW (ref 603–1613)
IgM (Immunoglobulin M), Srm: 7 mg/dL — ABNORMAL LOW (ref 15–143)

## 2018-12-22 LAB — PROTEIN ELECTROPHORESIS, SERUM, WITH REFLEX
A/G Ratio: 2.3 — ABNORMAL HIGH (ref 0.7–1.7)
Albumin ELP: 3.4 g/dL (ref 2.9–4.4)
Alpha-1-Globulin: 0.2 g/dL (ref 0.0–0.4)
Alpha-2-Globulin: 0.6 g/dL (ref 0.4–1.0)
Beta Globulin: 0.6 g/dL — ABNORMAL LOW (ref 0.7–1.3)
Gamma Globulin: 0.2 g/dL — ABNORMAL LOW (ref 0.4–1.8)
Globulin, Total: 1.5 g/dL — ABNORMAL LOW (ref 2.2–3.9)
Total Protein ELP: 4.9 g/dL — ABNORMAL LOW (ref 6.0–8.5)

## 2018-12-22 LAB — KAPPA/LAMBDA LIGHT CHAINS
Kappa free light chain: 35 mg/L — ABNORMAL HIGH (ref 3.3–19.4)
Kappa, lambda light chain ratio: 21.88 — ABNORMAL HIGH (ref 0.26–1.65)
Lambda free light chains: 1.6 mg/L — ABNORMAL LOW (ref 5.7–26.3)

## 2018-12-25 ENCOUNTER — Telehealth: Payer: Self-pay | Admitting: *Deleted

## 2018-12-25 NOTE — Telephone Encounter (Signed)
As noted below by Dr. Marin Olp, I informed the patient of his kappa light chain result of 35. He verbalized understanding.

## 2018-12-25 NOTE — Telephone Encounter (Signed)
-----   Message from [name], MD sent at 12/23/2018  4:57 PM EDT ----- Call - the kappa light chain is holding steady at 35!!  pete

## 2018-12-28 ENCOUNTER — Other Ambulatory Visit: Payer: Self-pay

## 2018-12-28 ENCOUNTER — Inpatient Hospital Stay: Payer: Medicare Other

## 2018-12-28 ENCOUNTER — Other Ambulatory Visit: Payer: Medicare Other

## 2018-12-28 ENCOUNTER — Inpatient Hospital Stay: Payer: Medicare Other | Attending: Hematology & Oncology

## 2018-12-28 VITALS — BP 126/55 | HR 64 | Temp 98.1°F | Resp 18

## 2018-12-28 DIAGNOSIS — C9000 Multiple myeloma not having achieved remission: Secondary | ICD-10-CM

## 2018-12-28 DIAGNOSIS — C9002 Multiple myeloma in relapse: Secondary | ICD-10-CM | POA: Insufficient documentation

## 2018-12-28 DIAGNOSIS — D801 Nonfamilial hypogammaglobulinemia: Secondary | ICD-10-CM | POA: Insufficient documentation

## 2018-12-28 DIAGNOSIS — Z5112 Encounter for antineoplastic immunotherapy: Secondary | ICD-10-CM | POA: Diagnosis not present

## 2018-12-28 LAB — CMP (CANCER CENTER ONLY)
ALT: 18 U/L (ref 0–44)
AST: 13 U/L — ABNORMAL LOW (ref 15–41)
Albumin: 3.5 g/dL (ref 3.5–5.0)
Alkaline Phosphatase: 38 U/L (ref 38–126)
Anion gap: 6 (ref 5–15)
BUN: 18 mg/dL (ref 8–23)
CO2: 26 mmol/L (ref 22–32)
Calcium: 8.6 mg/dL — ABNORMAL LOW (ref 8.9–10.3)
Chloride: 106 mmol/L (ref 98–111)
Creatinine: 1.1 mg/dL (ref 0.61–1.24)
GFR, Est AFR Am: >60 mL/min (ref >60)
GFR, Estimated: >60 mL/min (ref >60)
Glucose, Bld: 179 mg/dL — ABNORMAL HIGH (ref 70–99)
Potassium: 3.9 mmol/L (ref 3.5–5.1)
Sodium: 138 mmol/L (ref 135–145)
Total Bilirubin: 1.3 mg/dL — ABNORMAL HIGH (ref 0.3–1.2)
Total Protein: 4.9 g/dL — ABNORMAL LOW (ref 6.5–8.1)

## 2018-12-28 LAB — CBC WITH DIFFERENTIAL (CANCER CENTER ONLY)
Abs Immature Granulocytes: 0.02 10*3/uL (ref 0.00–0.07)
Basophils Absolute: 0.1 10*3/uL (ref 0.0–0.1)
Basophils Relative: 1 %
Eosinophils Absolute: 0.1 10*3/uL (ref 0.0–0.5)
Eosinophils Relative: 2 %
HCT: 42.2 % (ref 39.0–52.0)
Hemoglobin: 14.2 g/dL (ref 13.0–17.0)
Immature Granulocytes: 1 %
Lymphocytes Relative: 26 %
Lymphs Abs: 0.9 10*3/uL (ref 0.7–4.0)
MCH: 36.1 pg — ABNORMAL HIGH (ref 26.0–34.0)
MCHC: 33.6 g/dL (ref 30.0–36.0)
MCV: 107.4 fL — ABNORMAL HIGH (ref 80.0–100.0)
Monocytes Absolute: 0.8 10*3/uL (ref 0.1–1.0)
Monocytes Relative: 21 %
Neutro Abs: 1.7 10*3/uL (ref 1.7–7.7)
Neutrophils Relative %: 49 %
Platelet Count: 115 10*3/uL — ABNORMAL LOW (ref 150–400)
RBC: 3.93 MIL/uL — ABNORMAL LOW (ref 4.22–5.81)
RDW: 14.2 % (ref 11.5–15.5)
WBC Count: 3.5 10*3/uL — ABNORMAL LOW (ref 4.0–10.5)
nRBC: 0 % (ref 0.0–0.2)

## 2018-12-28 MED ORDER — SODIUM CHLORIDE 0.9 % IV SOLN
Freq: Once | INTRAVENOUS | Status: AC
  Administered 2018-12-28: 15:00:00 via INTRAVENOUS
  Filled 2018-12-28: qty 250

## 2018-12-28 MED ORDER — PROCHLORPERAZINE MALEATE 10 MG PO TABS
10.0000 mg | ORAL_TABLET | Freq: Once | ORAL | Status: AC
  Administered 2018-12-28: 10 mg via ORAL

## 2018-12-28 MED ORDER — IMMUNE GLOBULIN (HUMAN) 5 GM/50ML IV SOLN
400.0000 mg/kg | Freq: Once | INTRAVENOUS | Status: AC
  Administered 2018-12-28: 35 g via INTRAVENOUS
  Filled 2018-12-28: qty 50

## 2018-12-28 MED ORDER — ACETAMINOPHEN 325 MG PO TABS
650.0000 mg | ORAL_TABLET | Freq: Once | ORAL | Status: AC
  Administered 2018-12-28: 650 mg via ORAL

## 2018-12-28 MED ORDER — HEPARIN SOD (PORK) LOCK FLUSH 100 UNIT/ML IV SOLN
500.0000 [IU] | Freq: Once | INTRAVENOUS | Status: AC | PRN
  Administered 2018-12-28: 500 [IU]
  Filled 2018-12-28: qty 5

## 2018-12-28 MED ORDER — FAMOTIDINE IN NACL 20-0.9 MG/50ML-% IV SOLN
INTRAVENOUS | Status: AC
  Filled 2018-12-28: qty 50

## 2018-12-28 MED ORDER — DIPHENHYDRAMINE HCL 25 MG PO TABS
25.0000 mg | ORAL_TABLET | Freq: Once | ORAL | Status: AC
  Administered 2018-12-28: 25 mg via ORAL
  Filled 2018-12-28: qty 1

## 2018-12-28 MED ORDER — SODIUM CHLORIDE 0.9% FLUSH
10.0000 mL | INTRAVENOUS | Status: DC | PRN
  Administered 2018-12-28: 10 mL
  Filled 2018-12-28: qty 10

## 2018-12-28 MED ORDER — DIPHENHYDRAMINE HCL 25 MG PO CAPS
ORAL_CAPSULE | ORAL | Status: AC
  Filled 2018-12-28: qty 1

## 2018-12-28 MED ORDER — DIPHENHYDRAMINE HCL 25 MG PO CAPS
50.0000 mg | ORAL_CAPSULE | Freq: Once | ORAL | Status: AC
  Administered 2018-12-28: 25 mg via ORAL

## 2018-12-28 MED ORDER — SODIUM CHLORIDE 0.9 % IV SOLN
Freq: Once | INTRAVENOUS | Status: AC
  Administered 2018-12-28: 12:00:00 via INTRAVENOUS
  Filled 2018-12-28: qty 250

## 2018-12-28 MED ORDER — PROCHLORPERAZINE MALEATE 10 MG PO TABS
ORAL_TABLET | ORAL | Status: AC
  Filled 2018-12-28: qty 1

## 2018-12-28 MED ORDER — DEXAMETHASONE SODIUM PHOSPHATE 10 MG/ML IJ SOLN
INTRAMUSCULAR | Status: AC
  Filled 2018-12-28: qty 1

## 2018-12-28 MED ORDER — SODIUM CHLORIDE 0.9 % IV SOLN
Freq: Once | INTRAVENOUS | Status: DC
  Filled 2018-12-28: qty 250

## 2018-12-28 MED ORDER — ACETAMINOPHEN 325 MG PO TABS
ORAL_TABLET | ORAL | Status: AC
  Filled 2018-12-28: qty 2

## 2018-12-28 MED ORDER — FAMOTIDINE IN NACL 20-0.9 MG/50ML-% IV SOLN
20.0000 mg | Freq: Once | INTRAVENOUS | Status: AC
  Administered 2018-12-28: 20 mg via INTRAVENOUS

## 2018-12-28 MED ORDER — DEXTROSE 5 % IV SOLN
Freq: Once | INTRAVENOUS | Status: AC
  Administered 2018-12-28: 13:00:00 via INTRAVENOUS
  Filled 2018-12-28: qty 250

## 2018-12-28 MED ORDER — SODIUM CHLORIDE 0.9 % IV SOLN
20.0000 mg | Freq: Once | INTRAVENOUS | Status: DC
  Filled 2018-12-28: qty 2

## 2018-12-28 MED ORDER — DEXTROSE 5 % IV SOLN
71.0000 mg/m2 | Freq: Once | INTRAVENOUS | Status: AC
  Administered 2018-12-28: 150 mg via INTRAVENOUS
  Filled 2018-12-28: qty 60

## 2018-12-28 MED ORDER — DEXAMETHASONE SODIUM PHOSPHATE 10 MG/ML IJ SOLN
10.0000 mg | Freq: Once | INTRAMUSCULAR | Status: AC
  Administered 2018-12-28: 10 mg via INTRAVENOUS

## 2018-12-28 MED ORDER — ACETAMINOPHEN 325 MG PO TABS: 650.0000 mg | ORAL_TABLET | Freq: Once | ORAL | Status: DC

## 2018-12-28 MED ORDER — SODIUM CHLORIDE 0.9 % IV SOLN
10.0000 mg | Freq: Once | INTRAVENOUS | Status: DC
  Filled 2018-12-28: qty 1

## 2018-12-28 NOTE — Patient Instructions (Signed)
Implanted Port Insertion, Care After This sheet gives you information about how to care for yourself after your procedure. Your health care provider may also give you more specific instructions. If you have problems or questions, contact your health care provider. What can I expect after the procedure? After the procedure, it is common to have:  Discomfort at the port insertion site.  Bruising on the skin over the port. This should improve over 3-4 days. Follow these instructions at home: Port care  After your port is placed, you will get a manufacturer's information card. The card has information about your port. Keep this card with you at all times.  Take care of the port as told by your health care provider. Ask your health care provider if you or a family member can get training for taking care of the port at home. A home health care nurse may also take care of the port.  Make sure to remember what type of port you have. Incision care      Follow instructions from your health care provider about how to take care of your port insertion site. Make sure you: ? Wash your hands with soap and water before and after you change your bandage (dressing). If soap and water are not available, use hand sanitizer. ? Change your dressing as told by your health care provider. ? Leave stitches (sutures), skin glue, or adhesive strips in place. These skin closures may need to stay in place for 2 weeks or longer. If adhesive strip edges start to loosen and curl up, you may trim the loose edges. Do not remove adhesive strips completely unless your health care provider tells you to do that.  Check your port insertion site every day for signs of infection. Check for: ? Redness, swelling, or pain. ? Fluid or blood. ? Warmth. ? Pus or a bad smell. Activity  Return to your normal activities as told by your health care provider. Ask your health care provider what activities are safe for you.  Do not  lift anything that is heavier than 10 lb (4.5 kg), or the limit that you are told, until your health care provider says that it is safe. General instructions  Take over-the-counter and prescription medicines only as told by your health care provider.  Do not take baths, swim, or use a hot tub until your health care provider approves. Ask your health care provider if you may take showers. You may only be allowed to take sponge baths.  Do not drive for 24 hours if you were given a sedative during your procedure.  Wear a medical alert bracelet in case of an emergency. This will tell any health care providers that you have a port.  Keep all follow-up visits as told by your health care provider. This is important. Contact a health care provider if:  You cannot flush your port with saline as directed, or you cannot draw blood from the port.  You have a fever or chills.  You have redness, swelling, or pain around your port insertion site.  You have fluid or blood coming from your port insertion site.  Your port insertion site feels warm to the touch.  You have pus or a bad smell coming from the port insertion site. Get help right away if:  You have chest pain or shortness of breath.  You have bleeding from your port that you cannot control. Summary  Take care of the port as told by your health   care provider. Keep the manufacturer's information card with you at all times.  Change your dressing as told by your health care provider.  Contact a health care provider if you have a fever or chills or if you have redness, swelling, or pain around your port insertion site.  Keep all follow-up visits as told by your health care provider. This information is not intended to replace advice given to you by your health care provider. Make sure you discuss any questions you have with your health care provider. Document Released: 03/03/2013 Document Revised: 12/09/2017 Document Reviewed: 12/09/2017  Elsevier Patient Education  2020 [employer]  

## 2018-12-28 NOTE — Progress Notes (Signed)
Ok to give IVIG today without waiting for labs per Dr. Marin Olp

## 2018-12-28 NOTE — Patient Instructions (Signed)
Carfilzomib injection What is this medicine? CARFILZOMIB (kar FILZ oh mib) targets a specific protein within cancer cells and stops the cancer cells from growing. It is used to treat multiple myeloma. This medicine may be used for other purposes; ask your health care provider or pharmacist if you have questions. COMMON BRAND NAME(S): KYPROLIS What should I tell my health care provider before I take this medicine? They need to know if you have any of these conditions:  heart disease  history of blood clots  irregular heartbeat  kidney disease  liver disease  lung or breathing disease  an unusual or allergic reaction to carfilzomib, or other medicines, foods, dyes, or preservatives  pregnant or trying to get pregnant  breast-feeding How should I use this medicine? This medicine is for injection or infusion into a vein. It is given by a health care professional in a hospital or clinic setting. Talk to your pediatrician regarding the use of this medicine in children. Special care may be needed. Overdosage: If you think you have taken too much of this medicine contact a poison control center or emergency room at once. NOTE: This medicine is only for you. Do not share this medicine with others. What if I miss a dose? It is important not to miss your dose. Call your doctor or health care professional if you are unable to keep an appointment. What may interact with this medicine? Interactions are not expected. Give your health care provider a list of all the medicines, herbs, non-prescription drugs, or dietary supplements you use. Also tell them if you smoke, drink alcohol, or use illegal drugs. Some items may interact with your medicine. This list may not describe all possible interactions. Give your health care provider a list of all the medicines, herbs, non-prescription drugs, or dietary supplements you use. Also tell them if you smoke, drink alcohol, or use illegal drugs. Some items  may interact with your medicine. What should I watch for while using this medicine? Your condition will be monitored carefully while you are receiving this medicine. Report any side effects. Continue your course of treatment even though you feel ill unless your doctor tells you to stop. You may need blood work done while you are taking this medicine. Do not become pregnant while taking this medicine or for at least 6 months after stopping it. Women should inform their doctor if they wish to become pregnant or think they might be pregnant. There is a potential for serious side effects to an unborn child. Men should not father a child while taking this medicine and for at least 3 months after stopping it. Talk to your health care professional or pharmacist for more information. Do not breast-feed an infant while taking this medicine or for 2 weeks after the last dose. Check with your doctor or health care professional if you get an attack of severe diarrhea, nausea and vomiting, or if you sweat a lot. The loss of too much body fluid can make it dangerous for you to take this medicine. You may get dizzy. Do not drive, use machinery, or do anything that needs mental alertness until you know how this medicine affects you. Do not stand or sit up quickly, especially if you are an older patient. This reduces the risk of dizzy or fainting spells. What side effects may I notice from receiving this medicine? Side effects that you should report to your doctor or health care professional as soon as possible:  allergic reactions like skin  rash, itching or hives, swelling of the face, lips, or tongue  confusion  dizziness  feeling faint or lightheaded  fever or chills  palpitations  seizures  signs and symptoms of bleeding such as bloody or black, tarry stools; red or dark-brown urine; spitting up blood or brown material that looks like coffee grounds; red spots on the skin; unusual bruising or bleeding  including from the eye, gums, or nose  signs and symptoms of a blood clot such as breathing problems; changes in vision; chest pain; severe, sudden headache; pain, swelling, warmth in the leg; trouble speaking; sudden numbness or weakness of the face, arm or leg  signs and symptoms of kidney injury like trouble passing urine or change in the amount of urine  signs and symptoms of liver injury like dark yellow or brown urine; general ill feeling or flu-like symptoms; light-colored stools; loss of appetite; nausea; right upper belly pain; unusually weak or tired; yellowing of the eyes or skin Side effects that usually do not require medical attention (report to your doctor or health care professional if they continue or are bothersome):  back pain  cough  diarrhea  headache  muscle cramps  vomiting This list may not describe all possible side effects. Call your doctor for medical advice about side effects. You may report side effects to FDA at 1-800-FDA-1088. Where should I keep my medicine? This drug is given in a hospital or clinic and will not be stored at home. NOTE: This sheet is a summary. It may not cover all possible information. If you have questions about this medicine, talk to your doctor, pharmacist, or health care provider.  2020 Elsevier/Gold Standard (2017-02-26 14:07:13) Immune Globulin Injection What is this medicine? IMMUNE GLOBULIN (im MUNE GLOB yoo lin) helps to prevent or reduce the severity of certain infections in patients who are at risk. This medicine is collected from the pooled blood of many donors. It is used to treat immune system problems, thrombocytopenia, and Kawasaki syndrome. This medicine may be used for other purposes; ask your health care provider or pharmacist if you have questions. COMMON BRAND NAME(S): ASCENIV, Baygam, BIVIGAM, Carimune, Carimune NF, cutaquig, Cuvitru, Flebogamma, Flebogamma DIF, GamaSTAN, GamaSTAN S/D, Gamimune N, Gammagard,  Gammagard S/D, Gammaked, Gammaplex, Gammar-P IV, Gamunex, Gamunex-C, Hizentra, Iveegam, Iveegam EN, Octagam, Panglobulin, Panglobulin NF, panzyga, Polygam S/D, Privigen, Sandoglobulin, Venoglobulin-S, Vigam, Vivaglobulin, Xembify What should I tell my health care provider before I take this medicine? They need to know if you have any of these conditions:   diabetes   extremely low or no immune antibodies in the blood   heart disease   history of blood clots   hyperprolinemia   infection in the blood, sepsis   kidney disease   taking medicine that may change kidney function - ask your health care provider about your medicine   an unusual or allergic reaction to human immune globulin, albumin, maltose, sucrose, polysorbate 80, other medicines, foods, dyes, or preservatives   pregnant or trying to get pregnant   breast-feeding How should I use this medicine? This medicine is for injection into a muscle or infusion into a vein or skin. It is usually given by a health care professional in a hospital or clinic setting. In rare cases, some brands of this medicine might be given at home. You will be taught how to give this medicine. Use exactly as directed. Take your medicine at regular intervals. Do not take your medicine more often than directed. Talk to your  pediatrician regarding the use of this medicine in children. Special care may be needed. Overdosage: If you think you have taken too much of this medicine contact a poison control center or emergency room at once. NOTE: This medicine is only for you. Do not share this medicine with others. What if I miss a dose? It is important not to miss your dose. Call your doctor or health care professional if you are unable to keep an appointment. If you give yourself the medicine and you miss a dose, take it as soon as you can. If it is almost time for your next dose, take only that dose. Do not take double or extra doses. What may  interact with this medicine?  aspirin and aspirin-like medicines  cisplatin  cyclosporine  medicines for infection like acyclovir, adefovir, amphotericin B, bacitracin, cidofovir, foscarnet, ganciclovir, gentamicin, pentamidine, vancomycin  NSAIDS, medicines for pain and inflammation, like ibuprofen or naproxen  pamidronate  vaccines  zoledronic acid This list may not describe all possible interactions. Give your health care provider a list of all the medicines, herbs, non-prescription drugs, or dietary supplements you use. Also tell them if you smoke, drink alcohol, or use illegal drugs. Some items may interact with your medicine. What should I watch for while using this medicine? Your condition will be monitored carefully while you are receiving this medicine. This medicine is made from pooled blood donations of many different people. It may be possible to pass an infection in this medicine. However, the donors are screened for infections and all products are tested for HIV and hepatitis. The medicine is treated to kill most or all bacteria and viruses. Talk to your doctor about the risks and benefits of this medicine. Do not have vaccinations for at least 14 days before, or until at least 3 months after receiving this medicine. What side effects may I notice from receiving this medicine? Side effects that you should report to your doctor or health care professional as soon as possible:  allergic reactions like skin rash, itching or hives, swelling of the face, lips, or tongue  breathing problems  chest pain or tightness  fever, chills  headache with nausea, vomiting  neck pain or difficulty moving neck  pain when moving eyes  pain, swelling, warmth in the leg  problems with balance, talking, walking  sudden weight gain  swelling of the ankles, feet, hands  trouble passing urine or change in the amount of urine Side effects that usually do not require medical  attention (report to your doctor or health care professional if they continue or are bothersome):  dizzy, drowsy  flushing  increased sweating  leg cramps  muscle aches and pains  pain at site where injected This list may not describe all possible side effects. Call your doctor for medical advice about side effects. You may report side effects to FDA at 1-800-FDA-1088. Where should I keep my medicine? Keep out of the reach of children. This drug is usually given in a hospital or clinic and will not be stored at home. In rare cases, some brands of this medicine may be given at home. If you are using this medicine at home, you will be instructed on how to store this medicine. Throw away any unused medicine after the expiration date on the label. NOTE: This sheet is a summary. It may not cover all possible information. If you have questions about this medicine, talk to your doctor, pharmacist, or health care provider.  2020  Elsevier/Gold Standard (2008-08-03 11:44:49)

## 2018-12-29 LAB — IRON AND TIBC
Iron: 98 ug/dL (ref 42–163)
Saturation Ratios: 39 % (ref 20–55)
TIBC: 253 ug/dL (ref 202–409)
UIBC: 154 ug/dL (ref 117–376)

## 2018-12-29 LAB — FERRITIN: Ferritin: 258 ng/mL (ref 24–336)

## 2019-01-01 ENCOUNTER — Other Ambulatory Visit: Payer: Self-pay | Admitting: *Deleted

## 2019-01-01 DIAGNOSIS — C9000 Multiple myeloma not having achieved remission: Secondary | ICD-10-CM

## 2019-01-04 ENCOUNTER — Inpatient Hospital Stay: Payer: Medicare Other

## 2019-01-04 ENCOUNTER — Other Ambulatory Visit: Payer: Self-pay

## 2019-01-04 DIAGNOSIS — D801 Nonfamilial hypogammaglobulinemia: Secondary | ICD-10-CM | POA: Diagnosis not present

## 2019-01-04 DIAGNOSIS — Z5112 Encounter for antineoplastic immunotherapy: Secondary | ICD-10-CM | POA: Diagnosis not present

## 2019-01-04 DIAGNOSIS — C9000 Multiple myeloma not having achieved remission: Secondary | ICD-10-CM

## 2019-01-04 DIAGNOSIS — C9002 Multiple myeloma in relapse: Secondary | ICD-10-CM | POA: Diagnosis not present

## 2019-01-04 LAB — CBC WITH DIFFERENTIAL (CANCER CENTER ONLY)
Abs Immature Granulocytes: 0.01 10*3/uL (ref 0.00–0.07)
Basophils Absolute: 0 10*3/uL (ref 0.0–0.1)
Basophils Relative: 1 %
Eosinophils Absolute: 0.1 10*3/uL (ref 0.0–0.5)
Eosinophils Relative: 2 %
HCT: 41.2 % (ref 39.0–52.0)
Hemoglobin: 14 g/dL (ref 13.0–17.0)
Immature Granulocytes: 0 %
Lymphocytes Relative: 27 %
Lymphs Abs: 0.9 10*3/uL (ref 0.7–4.0)
MCH: 36.6 pg — ABNORMAL HIGH (ref 26.0–34.0)
MCHC: 34 g/dL (ref 30.0–36.0)
MCV: 107.6 fL — ABNORMAL HIGH (ref 80.0–100.0)
Monocytes Absolute: 0.8 10*3/uL (ref 0.1–1.0)
Monocytes Relative: 24 %
Neutro Abs: 1.6 10*3/uL — ABNORMAL LOW (ref 1.7–7.7)
Neutrophils Relative %: 46 %
Platelet Count: 118 10*3/uL — ABNORMAL LOW (ref 150–400)
RBC: 3.83 MIL/uL — ABNORMAL LOW (ref 4.22–5.81)
RDW: 14.2 % (ref 11.5–15.5)
WBC Count: 3.4 10*3/uL — ABNORMAL LOW (ref 4.0–10.5)
nRBC: 0 % (ref 0.0–0.2)

## 2019-01-04 LAB — CMP (CANCER CENTER ONLY)
ALT: 19 U/L (ref 0–44)
AST: 18 U/L (ref 15–41)
Albumin: 3.5 g/dL (ref 3.5–5.0)
Alkaline Phosphatase: 31 U/L — ABNORMAL LOW (ref 38–126)
Anion gap: 7 (ref 5–15)
BUN: 22 mg/dL (ref 8–23)
CO2: 25 mmol/L (ref 22–32)
Calcium: 8.9 mg/dL (ref 8.9–10.3)
Chloride: 106 mmol/L (ref 98–111)
Creatinine: 0.98 mg/dL (ref 0.61–1.24)
GFR, Est AFR Am: >60 mL/min (ref >60)
GFR, Estimated: >60 mL/min (ref >60)
Glucose, Bld: 88 mg/dL (ref 70–99)
Potassium: 3.9 mmol/L (ref 3.5–5.1)
Sodium: 138 mmol/L (ref 135–145)
Total Bilirubin: 1.1 mg/dL (ref 0.3–1.2)
Total Protein: 5.5 g/dL — ABNORMAL LOW (ref 6.5–8.1)

## 2019-01-04 MED ORDER — SODIUM CHLORIDE 0.9 % IV SOLN
Freq: Once | INTRAVENOUS | Status: AC
  Filled 2019-01-04: qty 250

## 2019-01-04 MED ORDER — SODIUM CHLORIDE 0.9 % IV SOLN
Freq: Once | INTRAVENOUS | Status: AC
  Administered 2019-01-04: 12:00:00 via INTRAVENOUS
  Filled 2019-01-04: qty 250

## 2019-01-04 MED ORDER — DIPHENHYDRAMINE HCL 25 MG PO CAPS
ORAL_CAPSULE | ORAL | Status: AC
  Filled 2019-01-04: qty 2

## 2019-01-04 MED ORDER — DIPHENHYDRAMINE HCL 25 MG PO CAPS
50.0000 mg | ORAL_CAPSULE | Freq: Once | ORAL | Status: AC
  Administered 2019-01-04: 50 mg via ORAL

## 2019-01-04 MED ORDER — DEXTROSE 5 % IV SOLN
71.0000 mg/m2 | Freq: Once | INTRAVENOUS | Status: AC
  Administered 2019-01-04: 150 mg via INTRAVENOUS
  Filled 2019-01-04: qty 60

## 2019-01-04 MED ORDER — ACETAMINOPHEN 325 MG PO TABS
650.0000 mg | ORAL_TABLET | Freq: Once | ORAL | Status: AC
  Administered 2019-01-04: 650 mg via ORAL

## 2019-01-04 MED ORDER — ACETAMINOPHEN 325 MG PO TABS
ORAL_TABLET | ORAL | Status: AC
  Filled 2019-01-04: qty 2

## 2019-01-04 MED ORDER — SODIUM CHLORIDE 0.9% FLUSH
10.0000 mL | INTRAVENOUS | Status: DC | PRN
  Administered 2019-01-04: 10 mL
  Filled 2019-01-04: qty 10

## 2019-01-04 MED ORDER — PROCHLORPERAZINE MALEATE 10 MG PO TABS
10.0000 mg | ORAL_TABLET | Freq: Once | ORAL | Status: AC
  Administered 2019-01-04: 10 mg via ORAL

## 2019-01-04 MED ORDER — HEPARIN SOD (PORK) LOCK FLUSH 100 UNIT/ML IV SOLN
500.0000 [IU] | Freq: Once | INTRAVENOUS | Status: AC | PRN
  Administered 2019-01-04: 500 [IU]
  Filled 2019-01-04: qty 5

## 2019-01-04 MED ORDER — PROCHLORPERAZINE MALEATE 10 MG PO TABS
ORAL_TABLET | ORAL | Status: AC
  Filled 2019-01-04: qty 1

## 2019-01-04 MED ORDER — SODIUM CHLORIDE 0.9 % IV SOLN
20.0000 mg | Freq: Once | INTRAVENOUS | Status: AC
  Administered 2019-01-04: 20 mg via INTRAVENOUS
  Filled 2019-01-04: qty 2

## 2019-01-04 NOTE — Patient Instructions (Signed)
Implanted Port Insertion, Care After This sheet gives you information about how to care for yourself after your procedure. Your health care provider may also give you more specific instructions. If you have problems or questions, contact your health care provider. What can I expect after the procedure? After the procedure, it is common to have:  Discomfort at the port insertion site.  Bruising on the skin over the port. This should improve over 3-4 days. Follow these instructions at home: Port care  After your port is placed, you will get a manufacturer's information card. The card has information about your port. Keep this card with you at all times.  Take care of the port as told by your health care provider. Ask your health care provider if you or a family member can get training for taking care of the port at home. A home health care nurse may also take care of the port.  Make sure to remember what type of port you have. Incision care      Follow instructions from your health care provider about how to take care of your port insertion site. Make sure you: ? Wash your hands with soap and water before and after you change your bandage (dressing). If soap and water are not available, use hand sanitizer. ? Change your dressing as told by your health care provider. ? Leave stitches (sutures), skin glue, or adhesive strips in place. These skin closures may need to stay in place for 2 weeks or longer. If adhesive strip edges start to loosen and curl up, you may trim the loose edges. Do not remove adhesive strips completely unless your health care provider tells you to do that.  Check your port insertion site every day for signs of infection. Check for: ? Redness, swelling, or pain. ? Fluid or blood. ? Warmth. ? Pus or a bad smell. Activity  Return to your normal activities as told by your health care provider. Ask your health care provider what activities are safe for you.  Do not  lift anything that is heavier than 10 lb (4.5 kg), or the limit that you are told, until your health care provider says that it is safe. General instructions  Take over-the-counter and prescription medicines only as told by your health care provider.  Do not take baths, swim, or use a hot tub until your health care provider approves. Ask your health care provider if you may take showers. You may only be allowed to take sponge baths.  Do not drive for 24 hours if you were given a sedative during your procedure.  Wear a medical alert bracelet in case of an emergency. This will tell any health care providers that you have a port.  Keep all follow-up visits as told by your health care provider. This is important. Contact a health care provider if:  You cannot flush your port with saline as directed, or you cannot draw blood from the port.  You have a fever or chills.  You have redness, swelling, or pain around your port insertion site.  You have fluid or blood coming from your port insertion site.  Your port insertion site feels warm to the touch.  You have pus or a bad smell coming from the port insertion site. Get help right away if:  You have chest pain or shortness of breath.  You have bleeding from your port that you cannot control. Summary  Take care of the port as told by your health   care provider. Keep the manufacturer's information card with you at all times.  Change your dressing as told by your health care provider.  Contact a health care provider if you have a fever or chills or if you have redness, swelling, or pain around your port insertion site.  Keep all follow-up visits as told by your health care provider. This information is not intended to replace advice given to you by your health care provider. Make sure you discuss any questions you have with your health care provider. Document Released: 03/03/2013 Document Revised: 12/09/2017 Document Reviewed: 12/09/2017  Elsevier Patient Education  2020 [employer]  

## 2019-01-05 ENCOUNTER — Other Ambulatory Visit: Payer: Self-pay | Admitting: *Deleted

## 2019-01-05 DIAGNOSIS — C9000 Multiple myeloma not having achieved remission: Secondary | ICD-10-CM

## 2019-01-05 DIAGNOSIS — S91332A Puncture wound without foreign body, left foot, initial encounter: Secondary | ICD-10-CM

## 2019-01-05 MED ORDER — POMALIDOMIDE 4 MG PO CAPS: 4.0000 mg | ORAL_CAPSULE | Freq: Every day | ORAL | 0 refills | Status: DC

## 2019-01-15 ENCOUNTER — Other Ambulatory Visit: Payer: Self-pay | Admitting: *Deleted

## 2019-01-15 DIAGNOSIS — C9000 Multiple myeloma not having achieved remission: Secondary | ICD-10-CM

## 2019-01-18 ENCOUNTER — Telehealth: Payer: Self-pay | Admitting: Hematology & Oncology

## 2019-01-18 ENCOUNTER — Inpatient Hospital Stay: Payer: Medicare Other

## 2019-01-18 ENCOUNTER — Inpatient Hospital Stay (HOSPITAL_BASED_OUTPATIENT_CLINIC_OR_DEPARTMENT_OTHER): Payer: Medicare Other | Admitting: Hematology & Oncology

## 2019-01-18 ENCOUNTER — Other Ambulatory Visit: Payer: Self-pay

## 2019-01-18 ENCOUNTER — Encounter: Payer: Self-pay | Admitting: Hematology & Oncology

## 2019-01-18 VITALS — BP 130/78 | HR 68 | Temp 97.7°F | Resp 17 | Wt 202.2 lb

## 2019-01-18 DIAGNOSIS — C9000 Multiple myeloma not having achieved remission: Secondary | ICD-10-CM | POA: Diagnosis not present

## 2019-01-18 DIAGNOSIS — C9002 Multiple myeloma in relapse: Secondary | ICD-10-CM | POA: Diagnosis not present

## 2019-01-18 DIAGNOSIS — D801 Nonfamilial hypogammaglobulinemia: Secondary | ICD-10-CM | POA: Diagnosis not present

## 2019-01-18 DIAGNOSIS — Z5112 Encounter for antineoplastic immunotherapy: Secondary | ICD-10-CM | POA: Diagnosis not present

## 2019-01-18 LAB — CMP (CANCER CENTER ONLY)
ALT: 37 U/L (ref 0–44)
AST: 29 U/L (ref 15–41)
Albumin: 3.7 g/dL (ref 3.5–5.0)
Alkaline Phosphatase: 35 U/L — ABNORMAL LOW (ref 38–126)
Anion gap: 8 (ref 5–15)
BUN: 22 mg/dL (ref 8–23)
CO2: 26 mmol/L (ref 22–32)
Calcium: 8.9 mg/dL (ref 8.9–10.3)
Chloride: 106 mmol/L (ref 98–111)
Creatinine: 1 mg/dL (ref 0.61–1.24)
GFR, Est AFR Am: >60 mL/min (ref >60)
GFR, Estimated: >60 mL/min (ref >60)
Glucose, Bld: 121 mg/dL — ABNORMAL HIGH (ref 70–99)
Potassium: 4 mmol/L (ref 3.5–5.1)
Sodium: 140 mmol/L (ref 135–145)
Total Bilirubin: 0.9 mg/dL (ref 0.3–1.2)
Total Protein: 6 g/dL — ABNORMAL LOW (ref 6.5–8.1)

## 2019-01-18 LAB — CBC WITH DIFFERENTIAL (CANCER CENTER ONLY)
Abs Immature Granulocytes: 0.01 10*3/uL (ref 0.00–0.07)
Basophils Absolute: 0.1 10*3/uL (ref 0.0–0.1)
Basophils Relative: 1 %
Eosinophils Absolute: 0.1 10*3/uL (ref 0.0–0.5)
Eosinophils Relative: 2 %
HCT: 44.1 % (ref 39.0–52.0)
Hemoglobin: 15 g/dL (ref 13.0–17.0)
Immature Granulocytes: 0 %
Lymphocytes Relative: 22 %
Lymphs Abs: 1 10*3/uL (ref 0.7–4.0)
MCH: 36.9 pg — ABNORMAL HIGH (ref 26.0–34.0)
MCHC: 34 g/dL (ref 30.0–36.0)
MCV: 108.4 fL — ABNORMAL HIGH (ref 80.0–100.0)
Monocytes Absolute: 0.8 10*3/uL (ref 0.1–1.0)
Monocytes Relative: 17 %
Neutro Abs: 2.7 10*3/uL (ref 1.7–7.7)
Neutrophils Relative %: 58 %
Platelet Count: 149 10*3/uL — ABNORMAL LOW (ref 150–400)
RBC: 4.07 MIL/uL — ABNORMAL LOW (ref 4.22–5.81)
RDW: 13.4 % (ref 11.5–15.5)
WBC Count: 4.7 10*3/uL (ref 4.0–10.5)
nRBC: 0 % (ref 0.0–0.2)

## 2019-01-18 MED ORDER — DEXTROSE 5 % IV SOLN
71.0000 mg/m2 | Freq: Once | INTRAVENOUS | Status: AC
  Administered 2019-01-18: 150 mg via INTRAVENOUS
  Filled 2019-01-18: qty 60

## 2019-01-18 MED ORDER — SODIUM CHLORIDE 0.9 % IV SOLN
20.0000 mg | Freq: Once | INTRAVENOUS | Status: DC
  Filled 2019-01-18: qty 2

## 2019-01-18 MED ORDER — PROCHLORPERAZINE MALEATE 10 MG PO TABS
10.0000 mg | ORAL_TABLET | Freq: Once | ORAL | Status: AC
  Administered 2019-01-18: 10 mg via ORAL

## 2019-01-18 MED ORDER — SODIUM CHLORIDE 0.9 % IV SOLN
20.0000 mg | Freq: Once | INTRAVENOUS | Status: AC
  Administered 2019-01-18: 20 mg via INTRAVENOUS
  Filled 2019-01-18: qty 20

## 2019-01-18 MED ORDER — PROCHLORPERAZINE MALEATE 10 MG PO TABS
ORAL_TABLET | ORAL | Status: AC
  Filled 2019-01-18: qty 1

## 2019-01-18 MED ORDER — SODIUM CHLORIDE 0.9% FLUSH
10.0000 mL | INTRAVENOUS | Status: DC | PRN
  Administered 2019-01-18: 10 mL
  Filled 2019-01-18: qty 10

## 2019-01-18 MED ORDER — SODIUM CHLORIDE 0.9% FLUSH
10.0000 mL | Freq: Once | INTRAVENOUS | Status: AC | PRN
  Administered 2019-01-18: 10 mL
  Filled 2019-01-18: qty 10

## 2019-01-18 MED ORDER — HEPARIN SOD (PORK) LOCK FLUSH 100 UNIT/ML IV SOLN
500.0000 [IU] | Freq: Once | INTRAVENOUS | Status: AC | PRN
  Administered 2019-01-18: 500 [IU]
  Filled 2019-01-18: qty 5

## 2019-01-18 MED ORDER — DIPHENHYDRAMINE HCL 25 MG PO CAPS
50.0000 mg | ORAL_CAPSULE | Freq: Once | ORAL | Status: AC
  Administered 2019-01-18: 50 mg via ORAL

## 2019-01-18 MED ORDER — SODIUM CHLORIDE 0.9 % IV SOLN
Freq: Once | INTRAVENOUS | Status: DC
  Filled 2019-01-18: qty 250

## 2019-01-18 MED ORDER — ACETAMINOPHEN 325 MG PO TABS
650.0000 mg | ORAL_TABLET | Freq: Once | ORAL | Status: AC
  Administered 2019-01-18: 650 mg via ORAL

## 2019-01-18 MED ORDER — ACETAMINOPHEN 325 MG PO TABS
ORAL_TABLET | ORAL | Status: AC
  Filled 2019-01-18: qty 2

## 2019-01-18 MED ORDER — DIPHENHYDRAMINE HCL 25 MG PO CAPS
ORAL_CAPSULE | ORAL | Status: AC
  Filled 2019-01-18: qty 2

## 2019-01-18 MED ORDER — SODIUM CHLORIDE 0.9 % IV SOLN
Freq: Once | INTRAVENOUS | Status: AC
  Administered 2019-01-18: 13:00:00 via INTRAVENOUS
  Filled 2019-01-18: qty 250

## 2019-01-18 NOTE — Progress Notes (Signed)
Hematology and Oncology Follow Up Visit  Rickey [name] [id number] February 14, 1941 78 y.o. 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 Elotuzumab/Pomalidomide/decadron -every 4 weeks- start 01/12/2018 Kyprolis/Pomalidomide/Decadron -- s/p cycle #4 -- start 04/06/2018 IVIG 40 mg IV q month   Interim History:  Rickey [name] is here today for follow-up.  So far, everything is going well with him.  [place]  He and his family just got back from the beach.  That a good time down at the beach.  He has been off the pomalidomide.  He says he does feel a little bit better being off pomalidomide.  His last kappa light chain was holding relatively steady at 3.5 mg/dL.  We are giving him IVIG.  I think this will help with his immunity.  His last IgG level was only 175 mg/dL.  His blood sugars are doing pretty well.  They have not been all that high.  He really does a good job in maintaining his blood sugars.  There is been no fever.  He has had no cough.  He has had no change in bowel or bladder habits.  Overall, I would have to say that his performance status is ECOG 1.     Medications:  Allergies as of 01/18/2019   No Known Allergies     Medication List       Accurate as of January 18, 2019 12:09 PM. If you have any questions, ask your nurse or doctor.        acyclovir 400 MG tablet Commonly known as: ZOVIRAX TAKE 1 TABLET BY MOUTH TWICE DAILY   aspirin 81 MG tablet Take 81 mg by mouth daily.   atorvastatin 10 MG tablet Commonly known as: LIPITOR Take 10 mg by mouth daily.   calcium-vitamin D 250-125 MG-UNIT tablet Commonly known as: OSCAL Take 1 tablet by mouth daily.   cycloSPORINE  0.05 % ophthalmic emulsion Commonly known as: RESTASIS Place 1 drop into both eyes 2 (two) times daily.   fish oil-omega-3 fatty acids 1000 MG capsule Take 1 g by mouth 2 (two) times daily.   INSULIN LISP & LISP PROT (HUM) [name]   insulin lispro 100 UNIT/ML injection Commonly known as: HUMALOG Inject into the skin. Insulin pump 12/21/2018 Typically 80U daily.   irbesartan-hydrochlorothiazide 300-12.5 MG tablet Commonly known as: AVALIDE Take 1 tablet by mouth daily.   lidocaine-prilocaine cream Commonly known as: EMLA Apply 1 application topically as needed.   montelukast 10 MG tablet Commonly known as: SINGULAIR Take 10 mg by mouth at bedtime.   Myrbetriq 50 MG Tb24 tablet Generic drug: mirabegron ER Take 50 mg by mouth daily.   ondansetron 4 MG disintegrating tablet Commonly known as: ZOFRAN-ODT Take 4 mg by mouth 4 (four) times daily as needed for nausea or vomiting.   pomalidomide 4 MG capsule Commonly known as: POMALYST Take 1 capsule (4 mg total) by mouth daily. Take with water on days 1-21. Repeat every 28 days. [id number]   Vitamin D 50 MCG (2000 UT) Caps Take by mouth every morning.       Allergies: No Known Allergies  Past Medical History, Surgical history, Social history, and  Family History were reviewed and updated.  Review of Systems: Review of Systems  Constitutional: Negative.   HENT: Negative.   Eyes: Negative.   Respiratory: Negative.   Cardiovascular: Negative.   Gastrointestinal: Negative.   Genitourinary: Negative.   Musculoskeletal: Negative.   Skin: Negative.   Neurological: Negative.   Endo/Heme/Allergies: Negative.   Psychiatric/Behavioral: Negative.      Physical Exam:  oxygen saturation is 100%.   Wt Readings from Last 3 Encounters:  01/18/19 202 lb 4 oz (91.7 kg)  12/21/18 205 lb (93 kg)  11/09/18 206 lb 1.3 oz (93.5 kg)    Physical Exam Vitals signs reviewed.  HENT:     Head: Normocephalic and atraumatic.  Eyes:      Pupils: Pupils are equal, round, and reactive to light.  Neck:     Musculoskeletal: Normal range of motion.  Cardiovascular:     Rate and Rhythm: Normal rate and regular rhythm.     Heart sounds: Normal heart sounds.  Pulmonary:     Effort: Pulmonary effort is normal.     Breath sounds: Normal breath sounds.  Abdominal:     General: Bowel sounds are normal.     Palpations: Abdomen is soft.  Musculoskeletal: Normal range of motion.        General: No tenderness or deformity.  Lymphadenopathy:     Cervical: No cervical adenopathy.  Skin:    General: Skin is warm and dry.     Findings: No erythema or rash.  Neurological:     Mental Status: He is alert and oriented to person, place, and time.  Psychiatric:        Behavior: Behavior normal.        Thought Content: Thought content normal.        Judgment: Judgment normal.      Lab Results  Component Value Date   WBC 4.7 01/18/2019   HGB 15.0 01/18/2019   HCT 44.1 01/18/2019   MCV 108.4 (H) 01/18/2019   PLT 149 (L) 01/18/2019   Lab Results  Component Value Date   FERRITIN 258 12/28/2018   IRON 98 12/28/2018   TIBC 253 12/28/2018   UIBC 154 12/28/2018   IRONPCTSAT 39 12/28/2018   Lab Results  Component Value Date   RBC 4.07 (L) 01/18/2019   Lab Results  Component Value Date   KPAFRELGTCHN 35.0 (H) 12/21/2018   LAMBDASER 1.6 (L) 12/21/2018   KAPLAMBRATIO 21.88 (H) 12/21/2018   Lab Results  Component Value Date   IGGSERUM 175 (L) 12/21/2018   IGA 11 (L) 12/21/2018   IGMSERUM 7 (L) 12/21/2018   Lab Results  Component Value Date   TOTALPROTELP 4.9 (L) 12/21/2018   ALBUMINELP 3.4 12/21/2018   A1GS 0.2 12/21/2018   A2GS 0.6 12/21/2018   BETS 0.6 (L) 12/21/2018   BETA2SER 0.3 04/19/2015   GAMS 0.2 (L) 12/21/2018   MSPIKE Not Observed 12/21/2018   SPEI Comment 11/23/2018     Chemistry      Component Value Date/Time   NA 140 01/18/2019 1100   NA 142 04/23/2017 1431   NA 139 07/18/2016 1135   K 4.0  01/18/2019 1100   K 4.0 04/23/2017 1431   K 4.4 07/18/2016 1135   CL 106 01/18/2019 1100   CL 105 04/23/2017 1431   CO2 26 01/18/2019 1100   CO2 28 04/23/2017 1431   CO2 27 07/18/2016 1135   BUN 22 01/18/2019 1100   BUN 20 04/23/2017 1431   BUN 12.2 07/18/2016  1135   CREATININE 1.00 01/18/2019 1100   CREATININE 1.2 04/23/2017 1431   CREATININE 0.9 07/18/2016 1135      Component Value Date/Time   CALCIUM 8.9 01/18/2019 1100   CALCIUM 9.3 04/23/2017 1431   CALCIUM 8.9 07/18/2016 1135   ALKPHOS 35 (L) 01/18/2019 1100   ALKPHOS 64 04/23/2017 1431   ALKPHOS 58 07/18/2016 1135   AST 29 01/18/2019 1100   AST 15 07/18/2016 1135   ALT 37 01/18/2019 1100   ALT 22 04/23/2017 1431   ALT 20 07/18/2016 1135   BILITOT 0.9 01/18/2019 1100   BILITOT 1.24 (H) 07/18/2016 1135      Impression and Plan: Rickey [name] is a very pleasant 78 yo caucasian gentleman with 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   Hopefully, the light chain results will hold steady.  I think if we do find that the light chains are going back up, we may have to consider him for either CAR-T therapy or see if he would qualify for the protocol utilizing the new agent-balantamab.  Overall, his quality of life is really what is important.  He is really done a good job with this.  His blood sugars have not been a problem.  I think the IVIG will continue to help.  We will plan to get him back to see [place] in another month.       [name], MD 8/24/202012:09 PM

## 2019-01-18 NOTE — Patient Instructions (Addendum)
Carfilzomib injection What is this medicine? CARFILZOMIB (kar FILZ oh mib) targets a specific protein within cancer cells and stops the cancer cells from growing. It is used to treat multiple myeloma. This medicine may be used for other purposes; ask your health care provider or pharmacist if you have questions. COMMON BRAND NAME(S): KYPROLIS What should I tell my health care provider before I take this medicine? They need to know if you have any of these conditions:  heart disease  history of blood clots  irregular heartbeat  kidney disease  liver disease  lung or breathing disease  an unusual or allergic reaction to carfilzomib, or other medicines, foods, dyes, or preservatives  pregnant or trying to get pregnant  breast-feeding How should I use this medicine? This medicine is for injection or infusion into a vein. It is given by a health care professional in a hospital or clinic setting. Talk to your pediatrician regarding the use of this medicine in children. Special care may be needed. Overdosage: If you think you have taken too much of this medicine contact a poison control center or emergency room at once. NOTE: This medicine is only for you. Do not share this medicine with others. What if I miss a dose? It is important not to miss your dose. Call your doctor or health care professional if you are unable to keep an appointment. What may interact with this medicine? Interactions are not expected. Give your health care provider a list of all the medicines, herbs, non-prescription drugs, or dietary supplements you use. Also tell them if you smoke, drink alcohol, or use illegal drugs. Some items may interact with your medicine. This list may not describe all possible interactions. Give your health care provider a list of all the medicines, herbs, non-prescription drugs, or dietary supplements you use. Also tell them if you smoke, drink alcohol, or use illegal drugs. Some items  may interact with your medicine. What should I watch for while using this medicine? Your condition will be monitored carefully while you are receiving this medicine. Report any side effects. Continue your course of treatment even though you feel ill unless your doctor tells you to stop. You may need blood work done while you are taking this medicine. Do not become pregnant while taking this medicine or for at least 6 months after stopping it. Women should inform their doctor if they wish to become pregnant or think they might be pregnant. There is a potential for serious side effects to an unborn child. Men should not father a child while taking this medicine and for at least 3 months after stopping it. Talk to your health care professional or pharmacist for more information. Do not breast-feed an infant while taking this medicine or for 2 weeks after the last dose. Check with your doctor or health care professional if you get an attack of severe diarrhea, nausea and vomiting, or if you sweat a lot. The loss of too much body fluid can make it dangerous for you to take this medicine. You may get dizzy. Do not drive, use machinery, or do anything that needs mental alertness until you know how this medicine affects you. Do not stand or sit up quickly, especially if you are an older patient. This reduces the risk of dizzy or fainting spells. What side effects may I notice from receiving this medicine? Side effects that you should report to your doctor or health care professional as soon as possible:  allergic reactions like skin  rash, itching or hives, swelling of the face, lips, or tongue  confusion  dizziness  feeling faint or lightheaded  fever or chills  palpitations  seizures  signs and symptoms of bleeding such as bloody or black, tarry stools; red or dark-brown urine; spitting up blood or brown material that looks like coffee grounds; red spots on the skin; unusual bruising or bleeding  including from the eye, gums, or nose  signs and symptoms of a blood clot such as breathing problems; changes in vision; chest pain; severe, sudden headache; pain, swelling, warmth in the leg; trouble speaking; sudden numbness or weakness of the face, arm or leg  signs and symptoms of kidney injury like trouble passing urine or change in the amount of urine  signs and symptoms of liver injury like dark yellow or brown urine; general ill feeling or flu-like symptoms; light-colored stools; loss of appetite; nausea; right upper belly pain; unusually weak or tired; yellowing of the eyes or skin Side effects that usually do not require medical attention (report to your doctor or health care professional if they continue or are bothersome):  back pain  cough  diarrhea  headache  muscle cramps  vomiting This list may not describe all possible side effects. Call your doctor for medical advice about side effects. You may report side effects to FDA at 1-800-FDA-1088. Where should I keep my medicine? This drug is given in a hospital or clinic and will not be stored at home. NOTE: This sheet is a summary. It may not cover all possible information. If you have questions about this medicine, talk to your doctor, pharmacist, or health care provider.  2020 Elsevier/Gold Standard (2017-02-26 14:07:13) Immune Globulin Injection What is this medicine? IMMUNE GLOBULIN (im MUNE GLOB yoo lin) helps to prevent or reduce the severity of certain infections in patients who are at risk. This medicine is collected from the pooled blood of many donors. It is used to treat immune system problems, thrombocytopenia, and Kawasaki syndrome. This medicine may be used for other purposes; ask your health care provider or pharmacist if you have questions. COMMON BRAND NAME(S): ASCENIV, Baygam, BIVIGAM, Carimune, Carimune NF, cutaquig, Cuvitru, Flebogamma, Flebogamma DIF, GamaSTAN, GamaSTAN S/D, Gamimune N, Gammagard,  Gammagard S/D, Gammaked, Gammaplex, Gammar-P IV, Gamunex, Gamunex-C, Hizentra, Iveegam, Iveegam EN, Octagam, Panglobulin, Panglobulin NF, panzyga, Polygam S/D, Privigen, Sandoglobulin, Venoglobulin-S, Vigam, Vivaglobulin, Xembify What should I tell my health care provider before I take this medicine? They need to know if you have any of these conditions:   diabetes   extremely low or no immune antibodies in the blood   heart disease   history of blood clots   hyperprolinemia   infection in the blood, sepsis   kidney disease   taking medicine that may change kidney function - ask your health care provider about your medicine   an unusual or allergic reaction to human immune globulin, albumin, maltose, sucrose, polysorbate 80, other medicines, foods, dyes, or preservatives   pregnant or trying to get pregnant   breast-feeding How should I use this medicine? This medicine is for injection into a muscle or infusion into a vein or skin. It is usually given by a health care professional in a hospital or clinic setting. In rare cases, some brands of this medicine might be given at home. You will be taught how to give this medicine. Use exactly as directed. Take your medicine at regular intervals. Do not take your medicine more often than directed. Talk to your   pediatrician regarding the use of this medicine in children. Special care may be needed. Overdosage: If you think you have taken too much of this medicine contact a poison control center or emergency room at once. NOTE: This medicine is only for you. Do not share this medicine with others. What if I miss a dose? It is important not to miss your dose. Call your doctor or health care professional if you are unable to keep an appointment. If you give yourself the medicine and you miss a dose, take it as soon as you can. If it is almost time for your next dose, take only that dose. Do not take double or extra doses. What may  interact with this medicine?  aspirin and aspirin-like medicines  cisplatin  cyclosporine  medicines for infection like acyclovir, adefovir, amphotericin B, bacitracin, cidofovir, foscarnet, ganciclovir, gentamicin, pentamidine, vancomycin  NSAIDS, medicines for pain and inflammation, like ibuprofen or naproxen  pamidronate  vaccines  zoledronic acid This list may not describe all possible interactions. Give your health care provider a list of all the medicines, herbs, non-prescription drugs, or dietary supplements you use. Also tell them if you smoke, drink alcohol, or use illegal drugs. Some items may interact with your medicine. What should I watch for while using this medicine? Your condition will be monitored carefully while you are receiving this medicine. This medicine is made from pooled blood donations of many different people. It may be possible to pass an infection in this medicine. However, the donors are screened for infections and all products are tested for HIV and hepatitis. The medicine is treated to kill most or all bacteria and viruses. Talk to your doctor about the risks and benefits of this medicine. Do not have vaccinations for at least 14 days before, or until at least 3 months after receiving this medicine. What side effects may I notice from receiving this medicine? Side effects that you should report to your doctor or health care professional as soon as possible:  allergic reactions like skin rash, itching or hives, swelling of the face, lips, or tongue  breathing problems  chest pain or tightness  fever, chills  headache with nausea, vomiting  neck pain or difficulty moving neck  pain when moving eyes  pain, swelling, warmth in the leg  problems with balance, talking, walking  sudden weight gain  swelling of the ankles, feet, hands  trouble passing urine or change in the amount of urine Side effects that usually do not require medical  attention (report to your doctor or health care professional if they continue or are bothersome):  dizzy, drowsy  flushing  increased sweating  leg cramps  muscle aches and pains  pain at site where injected This list may not describe all possible side effects. Call your doctor for medical advice about side effects. You may report side effects to FDA at 1-800-FDA-1088. Where should I keep my medicine? Keep out of the reach of children. This drug is usually given in a hospital or clinic and will not be stored at home. In rare cases, some brands of this medicine may be given at home. If you are using this medicine at home, you will be instructed on how to store this medicine. Throw away any unused medicine after the expiration date on the label. NOTE: This sheet is a summary. It may not cover all possible information. If you have questions about this medicine, talk to your doctor, pharmacist, or health care provider.  2020   Elsevier/Gold Standard (2008-08-03 11:44:49)  

## 2019-01-18 NOTE — Patient Instructions (Signed)

## 2019-01-18 NOTE — Telephone Encounter (Signed)
Appointments scheduled  Per 8/24 los

## 2019-01-19 LAB — IGG, IGA, IGM
IgA: 9 mg/dL — ABNORMAL LOW (ref 61–437)
IgG (Immunoglobin G), Serum: 525 mg/dL — ABNORMAL LOW (ref 603–1613)
IgM (Immunoglobulin M), Srm: <5 mg/dL — ABNORMAL LOW (ref 15–143)

## 2019-01-19 LAB — PROTEIN ELECTROPHORESIS, SERUM, WITH REFLEX
A/G Ratio: 1.8 — ABNORMAL HIGH (ref 0.7–1.7)
Albumin ELP: 3.6 g/dL (ref 2.9–4.4)
Alpha-1-Globulin: 0.2 g/dL (ref 0.0–0.4)
Alpha-2-Globulin: 0.7 g/dL (ref 0.4–1.0)
Beta Globulin: 0.7 g/dL (ref 0.7–1.3)
Gamma Globulin: 0.4 g/dL (ref 0.4–1.8)
Globulin, Total: 2 g/dL — ABNORMAL LOW (ref 2.2–3.9)
Total Protein ELP: 5.6 g/dL — ABNORMAL LOW (ref 6.0–8.5)

## 2019-01-19 LAB — KAPPA/LAMBDA LIGHT CHAINS
Kappa free light chain: 31.2 mg/L — ABNORMAL HIGH (ref 3.3–19.4)
Kappa, lambda light chain ratio: >20.8 — ABNORMAL HIGH (ref 0.26–1.65)
Lambda free light chains: <1.5 mg/L — ABNORMAL LOW (ref 5.7–26.3)

## 2019-01-20 ENCOUNTER — Encounter: Payer: Self-pay | Admitting: *Deleted

## 2019-01-22 ENCOUNTER — Other Ambulatory Visit: Payer: Self-pay | Admitting: *Deleted

## 2019-01-22 DIAGNOSIS — C9000 Multiple myeloma not having achieved remission: Secondary | ICD-10-CM

## 2019-01-25 ENCOUNTER — Inpatient Hospital Stay: Payer: Medicare Other

## 2019-01-25 ENCOUNTER — Other Ambulatory Visit: Payer: Self-pay

## 2019-01-25 VITALS — BP 132/64 | HR 68 | Temp 97.1°F | Resp 16

## 2019-01-25 DIAGNOSIS — C9000 Multiple myeloma not having achieved remission: Secondary | ICD-10-CM

## 2019-01-25 DIAGNOSIS — D801 Nonfamilial hypogammaglobulinemia: Secondary | ICD-10-CM | POA: Diagnosis not present

## 2019-01-25 DIAGNOSIS — C9002 Multiple myeloma in relapse: Secondary | ICD-10-CM | POA: Diagnosis not present

## 2019-01-25 DIAGNOSIS — Z5112 Encounter for antineoplastic immunotherapy: Secondary | ICD-10-CM | POA: Diagnosis not present

## 2019-01-25 LAB — CBC WITH DIFFERENTIAL (CANCER CENTER ONLY)
Abs Immature Granulocytes: 0.03 10*3/uL (ref 0.00–0.07)
Basophils Absolute: 0 10*3/uL (ref 0.0–0.1)
Basophils Relative: 1 %
Eosinophils Absolute: 0.1 10*3/uL (ref 0.0–0.5)
Eosinophils Relative: 1 %
HCT: 43.9 % (ref 39.0–52.0)
Hemoglobin: 14.8 g/dL (ref 13.0–17.0)
Immature Granulocytes: 1 %
Lymphocytes Relative: 23 %
Lymphs Abs: 1.1 10*3/uL (ref 0.7–4.0)
MCH: 36.1 pg — ABNORMAL HIGH (ref 26.0–34.0)
MCHC: 33.7 g/dL (ref 30.0–36.0)
MCV: 107.1 fL — ABNORMAL HIGH (ref 80.0–100.0)
Monocytes Absolute: 0.8 10*3/uL (ref 0.1–1.0)
Monocytes Relative: 17 %
Neutro Abs: 2.7 10*3/uL (ref 1.7–7.7)
Neutrophils Relative %: 57 %
Platelet Count: 95 10*3/uL — ABNORMAL LOW (ref 150–400)
RBC: 4.1 MIL/uL — ABNORMAL LOW (ref 4.22–5.81)
RDW: 13 % (ref 11.5–15.5)
WBC Count: 4.7 10*3/uL (ref 4.0–10.5)
nRBC: 0 % (ref 0.0–0.2)

## 2019-01-25 LAB — CMP (CANCER CENTER ONLY)
ALT: 27 U/L (ref 0–44)
AST: 23 U/L (ref 15–41)
Albumin: 3.7 g/dL (ref 3.5–5.0)
Alkaline Phosphatase: 38 U/L (ref 38–126)
Anion gap: 7 (ref 5–15)
BUN: 24 mg/dL — ABNORMAL HIGH (ref 8–23)
CO2: 25 mmol/L (ref 22–32)
Calcium: 9.3 mg/dL (ref 8.9–10.3)
Chloride: 107 mmol/L (ref 98–111)
Creatinine: 1 mg/dL (ref 0.61–1.24)
GFR, Est AFR Am: >60 mL/min (ref >60)
GFR, Estimated: >60 mL/min (ref >60)
Glucose, Bld: 188 mg/dL — ABNORMAL HIGH (ref 70–99)
Potassium: 4.1 mmol/L (ref 3.5–5.1)
Sodium: 139 mmol/L (ref 135–145)
Total Bilirubin: 1.1 mg/dL (ref 0.3–1.2)
Total Protein: 5.7 g/dL — ABNORMAL LOW (ref 6.5–8.1)

## 2019-01-25 MED ORDER — SODIUM CHLORIDE 0.9 % IV SOLN
Freq: Once | INTRAVENOUS | Status: DC
  Filled 2019-01-25: qty 250

## 2019-01-25 MED ORDER — ACETAMINOPHEN 325 MG PO TABS
ORAL_TABLET | ORAL | Status: AC
  Filled 2019-01-25: qty 2

## 2019-01-25 MED ORDER — SODIUM CHLORIDE 0.9% FLUSH
10.0000 mL | INTRAVENOUS | Status: DC | PRN
  Administered 2019-01-25: 10 mL
  Filled 2019-01-25: qty 10

## 2019-01-25 MED ORDER — HEPARIN SOD (PORK) LOCK FLUSH 100 UNIT/ML IV SOLN
500.0000 [IU] | Freq: Once | INTRAVENOUS | Status: AC | PRN
  Administered 2019-01-25: 500 [IU]
  Filled 2019-01-25: qty 5

## 2019-01-25 MED ORDER — ACETAMINOPHEN 325 MG PO TABS
650.0000 mg | ORAL_TABLET | Freq: Once | ORAL | Status: AC
  Administered 2019-01-25: 650 mg via ORAL

## 2019-01-25 MED ORDER — PROCHLORPERAZINE MALEATE 10 MG PO TABS
10.0000 mg | ORAL_TABLET | Freq: Once | ORAL | Status: AC
  Administered 2019-01-25: 10 mg via ORAL

## 2019-01-25 MED ORDER — DIPHENHYDRAMINE HCL 25 MG PO CAPS
50.0000 mg | ORAL_CAPSULE | Freq: Once | ORAL | Status: AC
  Administered 2019-01-25: 50 mg via ORAL

## 2019-01-25 MED ORDER — SODIUM CHLORIDE 0.9 % IV SOLN
Freq: Once | INTRAVENOUS | Status: AC
  Administered 2019-01-25: 11:00:00 via INTRAVENOUS
  Filled 2019-01-25: qty 250

## 2019-01-25 MED ORDER — DIPHENHYDRAMINE HCL 25 MG PO CAPS
ORAL_CAPSULE | ORAL | Status: AC
  Filled 2019-01-25: qty 2

## 2019-01-25 MED ORDER — DEXTROSE 5 % IV SOLN
71.0000 mg/m2 | Freq: Once | INTRAVENOUS | Status: AC
  Administered 2019-01-25: 150 mg via INTRAVENOUS
  Filled 2019-01-25: qty 60

## 2019-01-25 MED ORDER — SODIUM CHLORIDE 0.9 % IV SOLN
20.0000 mg | Freq: Once | INTRAVENOUS | Status: AC
  Administered 2019-01-25: 20 mg via INTRAVENOUS
  Filled 2019-01-25: qty 20

## 2019-01-25 MED ORDER — PROCHLORPERAZINE MALEATE 10 MG PO TABS
ORAL_TABLET | ORAL | Status: AC
  Filled 2019-01-25: qty 1

## 2019-01-25 NOTE — Patient Instructions (Signed)
Carfilzomib injection What is this medicine? CARFILZOMIB (kar FILZ oh mib) targets a specific protein within cancer cells and stops the cancer cells from growing. It is used to treat multiple myeloma. This medicine may be used for other purposes; ask your health care provider or pharmacist if you have questions. COMMON BRAND NAME(S): KYPROLIS What should I tell my health care provider before I take this medicine? They need to know if you have any of these conditions:  heart disease  history of blood clots  irregular heartbeat  kidney disease  liver disease  lung or breathing disease  an unusual or allergic reaction to carfilzomib, or other medicines, foods, dyes, or preservatives  pregnant or trying to get pregnant  breast-feeding How should I use this medicine? This medicine is for injection or infusion into a vein. It is given by a health care professional in a hospital or clinic setting. Talk to your pediatrician regarding the use of this medicine in children. Special care may be needed. Overdosage: If you think you have taken too much of this medicine contact a poison control center or emergency room at once. NOTE: This medicine is only for you. Do not share this medicine with others. What if I miss a dose? It is important not to miss your dose. Call your doctor or health care professional if you are unable to keep an appointment. What may interact with this medicine? Interactions are not expected. Give your health care provider a list of all the medicines, herbs, non-prescription drugs, or dietary supplements you use. Also tell them if you smoke, drink alcohol, or use illegal drugs. Some items may interact with your medicine. This list may not describe all possible interactions. Give your health care provider a list of all the medicines, herbs, non-prescription drugs, or dietary supplements you use. Also tell them if you smoke, drink alcohol, or use illegal drugs. Some items  may interact with your medicine. What should I watch for while using this medicine? Your condition will be monitored carefully while you are receiving this medicine. Report any side effects. Continue your course of treatment even though you feel ill unless your doctor tells you to stop. You may need blood work done while you are taking this medicine. Do not become pregnant while taking this medicine or for at least 6 months after stopping it. Women should inform their doctor if they wish to become pregnant or think they might be pregnant. There is a potential for serious side effects to an unborn child. Men should not father a child while taking this medicine and for at least 3 months after stopping it. Talk to your health care professional or pharmacist for more information. Do not breast-feed an infant while taking this medicine or for 2 weeks after the last dose. Check with your doctor or health care professional if you get an attack of severe diarrhea, nausea and vomiting, or if you sweat a lot. The loss of too much body fluid can make it dangerous for you to take this medicine. You may get dizzy. Do not drive, use machinery, or do anything that needs mental alertness until you know how this medicine affects you. Do not stand or sit up quickly, especially if you are an older patient. This reduces the risk of dizzy or fainting spells. What side effects may I notice from receiving this medicine? Side effects that you should report to your doctor or health care professional as soon as possible:  allergic reactions like skin  rash, itching or hives, swelling of the face, lips, or tongue  confusion  dizziness  feeling faint or lightheaded  fever or chills  palpitations  seizures  signs and symptoms of bleeding such as bloody or black, tarry stools; red or dark-brown urine; spitting up blood or brown material that looks like coffee grounds; red spots on the skin; unusual bruising or bleeding  including from the eye, gums, or nose  signs and symptoms of a blood clot such as breathing problems; changes in vision; chest pain; severe, sudden headache; pain, swelling, warmth in the leg; trouble speaking; sudden numbness or weakness of the face, arm or leg  signs and symptoms of kidney injury like trouble passing urine or change in the amount of urine  signs and symptoms of liver injury like dark yellow or brown urine; general ill feeling or flu-like symptoms; light-colored stools; loss of appetite; nausea; right upper belly pain; unusually weak or tired; yellowing of the eyes or skin Side effects that usually do not require medical attention (report to your doctor or health care professional if they continue or are bothersome):  back pain  cough  diarrhea  headache  muscle cramps  vomiting This list may not describe all possible side effects. Call your doctor for medical advice about side effects. You may report side effects to FDA at 1-800-FDA-1088. Where should I keep my medicine? This drug is given in a hospital or clinic and will not be stored at home. NOTE: This sheet is a summary. It may not cover all possible information. If you have questions about this medicine, talk to your doctor, pharmacist, or health care provider.  2020 Elsevier/Gold Standard (2017-02-26 14:07:13)  

## 2019-01-25 NOTE — Progress Notes (Signed)
Reviewed all labwork with Dr. Marin Olp including platelet of 95.  Ok to treat today.

## 2019-01-25 NOTE — Patient Instructions (Signed)

## 2019-01-25 NOTE — Progress Notes (Signed)
OK to treat with platelets of 95 per order of Dr. Marin Olp.

## 2019-01-29 ENCOUNTER — Other Ambulatory Visit: Payer: Self-pay | Admitting: *Deleted

## 2019-01-29 DIAGNOSIS — C9000 Multiple myeloma not having achieved remission: Secondary | ICD-10-CM

## 2019-02-02 ENCOUNTER — Inpatient Hospital Stay: Payer: Medicare Other

## 2019-02-02 ENCOUNTER — Other Ambulatory Visit: Payer: Self-pay

## 2019-02-02 ENCOUNTER — Inpatient Hospital Stay: Payer: Medicare Other | Attending: Hematology & Oncology

## 2019-02-02 VITALS — BP 128/59 | HR 71 | Temp 97.1°F

## 2019-02-02 DIAGNOSIS — Z79899 Other long term (current) drug therapy: Secondary | ICD-10-CM | POA: Diagnosis not present

## 2019-02-02 DIAGNOSIS — Z9484 Stem cells transplant status: Secondary | ICD-10-CM | POA: Insufficient documentation

## 2019-02-02 DIAGNOSIS — Z794 Long term (current) use of insulin: Secondary | ICD-10-CM | POA: Insufficient documentation

## 2019-02-02 DIAGNOSIS — D801 Nonfamilial hypogammaglobulinemia: Secondary | ICD-10-CM | POA: Diagnosis not present

## 2019-02-02 DIAGNOSIS — Z5112 Encounter for antineoplastic immunotherapy: Secondary | ICD-10-CM | POA: Insufficient documentation

## 2019-02-02 DIAGNOSIS — C9002 Multiple myeloma in relapse: Secondary | ICD-10-CM | POA: Diagnosis not present

## 2019-02-02 DIAGNOSIS — C9000 Multiple myeloma not having achieved remission: Secondary | ICD-10-CM

## 2019-02-02 LAB — CBC WITH DIFFERENTIAL (CANCER CENTER ONLY)
Abs Immature Granulocytes: 0.01 10*3/uL (ref 0.00–0.07)
Basophils Absolute: 0 10*3/uL (ref 0.0–0.1)
Basophils Relative: 1 %
Eosinophils Absolute: 0.1 10*3/uL (ref 0.0–0.5)
Eosinophils Relative: 2 %
HCT: 42.3 % (ref 39.0–52.0)
Hemoglobin: 14.1 g/dL (ref 13.0–17.0)
Immature Granulocytes: 0 %
Lymphocytes Relative: 22 %
Lymphs Abs: 0.8 10*3/uL (ref 0.7–4.0)
MCH: 36 pg — ABNORMAL HIGH (ref 26.0–34.0)
MCHC: 33.3 g/dL (ref 30.0–36.0)
MCV: 107.9 fL — ABNORMAL HIGH (ref 80.0–100.0)
Monocytes Absolute: 0.8 10*3/uL (ref 0.1–1.0)
Monocytes Relative: 21 %
Neutro Abs: 2 10*3/uL (ref 1.7–7.7)
Neutrophils Relative %: 54 %
Platelet Count: 139 10*3/uL — ABNORMAL LOW (ref 150–400)
RBC: 3.92 MIL/uL — ABNORMAL LOW (ref 4.22–5.81)
RDW: 13.3 % (ref 11.5–15.5)
WBC Count: 3.8 10*3/uL — ABNORMAL LOW (ref 4.0–10.5)
nRBC: 0 % (ref 0.0–0.2)

## 2019-02-02 LAB — CMP (CANCER CENTER ONLY)
ALT: 27 U/L (ref 0–44)
AST: 24 U/L (ref 15–41)
Albumin: 3.7 g/dL (ref 3.5–5.0)
Alkaline Phosphatase: 39 U/L (ref 38–126)
Anion gap: 6 (ref 5–15)
BUN: 24 mg/dL — ABNORMAL HIGH (ref 8–23)
CO2: 26 mmol/L (ref 22–32)
Calcium: 9.1 mg/dL (ref 8.9–10.3)
Chloride: 106 mmol/L (ref 98–111)
Creatinine: 1.08 mg/dL (ref 0.61–1.24)
GFR, Est AFR Am: >60 mL/min (ref >60)
GFR, Estimated: >60 mL/min (ref >60)
Glucose, Bld: 151 mg/dL — ABNORMAL HIGH (ref 70–99)
Potassium: 3.9 mmol/L (ref 3.5–5.1)
Sodium: 138 mmol/L (ref 135–145)
Total Bilirubin: 1 mg/dL (ref 0.3–1.2)
Total Protein: 5.4 g/dL — ABNORMAL LOW (ref 6.5–8.1)

## 2019-02-02 MED ORDER — PROCHLORPERAZINE MALEATE 10 MG PO TABS
ORAL_TABLET | ORAL | Status: AC
  Filled 2019-02-02: qty 1

## 2019-02-02 MED ORDER — DIPHENHYDRAMINE HCL 25 MG PO CAPS
ORAL_CAPSULE | ORAL | Status: AC
  Filled 2019-02-02: qty 2

## 2019-02-02 MED ORDER — SODIUM CHLORIDE 0.9 % IV SOLN
Freq: Once | INTRAVENOUS | Status: AC
  Administered 2019-02-02: 11:00:00 via INTRAVENOUS
  Filled 2019-02-02: qty 250

## 2019-02-02 MED ORDER — SODIUM CHLORIDE 0.9% FLUSH
10.0000 mL | INTRAVENOUS | Status: DC | PRN
  Administered 2019-02-02: 10 mL
  Filled 2019-02-02: qty 10

## 2019-02-02 MED ORDER — PROCHLORPERAZINE MALEATE 10 MG PO TABS
10.0000 mg | ORAL_TABLET | Freq: Once | ORAL | Status: AC
  Administered 2019-02-02: 10 mg via ORAL

## 2019-02-02 MED ORDER — DIPHENHYDRAMINE HCL 25 MG PO CAPS
50.0000 mg | ORAL_CAPSULE | Freq: Once | ORAL | Status: AC
  Administered 2019-02-02: 50 mg via ORAL

## 2019-02-02 MED ORDER — ACETAMINOPHEN 325 MG PO TABS
ORAL_TABLET | ORAL | Status: AC
  Filled 2019-02-02: qty 2

## 2019-02-02 MED ORDER — HEPARIN SOD (PORK) LOCK FLUSH 100 UNIT/ML IV SOLN
500.0000 [IU] | Freq: Once | INTRAVENOUS | Status: AC | PRN
  Administered 2019-02-02: 500 [IU]
  Filled 2019-02-02: qty 5

## 2019-02-02 MED ORDER — ACETAMINOPHEN 325 MG PO TABS
650.0000 mg | ORAL_TABLET | Freq: Once | ORAL | Status: AC
  Administered 2019-02-02: 650 mg via ORAL

## 2019-02-02 MED ORDER — SODIUM CHLORIDE 0.9 % IV SOLN
20.0000 mg | Freq: Once | INTRAVENOUS | Status: AC
  Administered 2019-02-02: 20 mg via INTRAVENOUS
  Filled 2019-02-02: qty 2

## 2019-02-02 MED ORDER — DEXTROSE 5 % IV SOLN
71.0000 mg/m2 | Freq: Once | INTRAVENOUS | Status: AC
  Administered 2019-02-02: 150 mg via INTRAVENOUS
  Filled 2019-02-02: qty 60

## 2019-02-02 NOTE — Patient Instructions (Signed)
Carfilzomib injection What is this medicine? CARFILZOMIB (kar FILZ oh mib) targets a specific protein within cancer cells and stops the cancer cells from growing. It is used to treat multiple myeloma. This medicine may be used for other purposes; ask your health care provider or pharmacist if you have questions. COMMON BRAND NAME(S): KYPROLIS What should I tell my health care provider before I take this medicine? They need to know if you have any of these conditions:  heart disease  history of blood clots  irregular heartbeat  kidney disease  liver disease  lung or breathing disease  an unusual or allergic reaction to carfilzomib, or other medicines, foods, dyes, or preservatives  pregnant or trying to get pregnant  breast-feeding How should I use this medicine? This medicine is for injection or infusion into a vein. It is given by a health care professional in a hospital or clinic setting. Talk to your pediatrician regarding the use of this medicine in children. Special care may be needed. Overdosage: If you think you have taken too much of this medicine contact a poison control center or emergency room at once. NOTE: This medicine is only for you. Do not share this medicine with others. What if I miss a dose? It is important not to miss your dose. Call your doctor or health care professional if you are unable to keep an appointment. What may interact with this medicine? Interactions are not expected. Give your health care provider a list of all the medicines, herbs, non-prescription drugs, or dietary supplements you use. Also tell them if you smoke, drink alcohol, or use illegal drugs. Some items may interact with your medicine. This list may not describe all possible interactions. Give your health care provider a list of all the medicines, herbs, non-prescription drugs, or dietary supplements you use. Also tell them if you smoke, drink alcohol, or use illegal drugs. Some items  may interact with your medicine. What should I watch for while using this medicine? Your condition will be monitored carefully while you are receiving this medicine. Report any side effects. Continue your course of treatment even though you feel ill unless your doctor tells you to stop. You may need blood work done while you are taking this medicine. Do not become pregnant while taking this medicine or for at least 6 months after stopping it. Women should inform their doctor if they wish to become pregnant or think they might be pregnant. There is a potential for serious side effects to an unborn child. Men should not father a child while taking this medicine and for at least 3 months after stopping it. Talk to your health care professional or pharmacist for more information. Do not breast-feed an infant while taking this medicine or for 2 weeks after the last dose. Check with your doctor or health care professional if you get an attack of severe diarrhea, nausea and vomiting, or if you sweat a lot. The loss of too much body fluid can make it dangerous for you to take this medicine. You may get dizzy. Do not drive, use machinery, or do anything that needs mental alertness until you know how this medicine affects you. Do not stand or sit up quickly, especially if you are an older patient. This reduces the risk of dizzy or fainting spells. What side effects may I notice from receiving this medicine? Side effects that you should report to your doctor or health care professional as soon as possible:  allergic reactions like skin  rash, itching or hives, swelling of the face, lips, or tongue  confusion  dizziness  feeling faint or lightheaded  fever or chills  palpitations  seizures  signs and symptoms of bleeding such as bloody or black, tarry stools; red or dark-brown urine; spitting up blood or brown material that looks like coffee grounds; red spots on the skin; unusual bruising or bleeding  including from the eye, gums, or nose  signs and symptoms of a blood clot such as breathing problems; changes in vision; chest pain; severe, sudden headache; pain, swelling, warmth in the leg; trouble speaking; sudden numbness or weakness of the face, arm or leg  signs and symptoms of kidney injury like trouble passing urine or change in the amount of urine  signs and symptoms of liver injury like dark yellow or brown urine; general ill feeling or flu-like symptoms; light-colored stools; loss of appetite; nausea; right upper belly pain; unusually weak or tired; yellowing of the eyes or skin Side effects that usually do not require medical attention (report to your doctor or health care professional if they continue or are bothersome):  back pain  cough  diarrhea  headache  muscle cramps  vomiting This list may not describe all possible side effects. Call your doctor for medical advice about side effects. You may report side effects to FDA at 1-800-FDA-1088. Where should I keep my medicine? This drug is given in a hospital or clinic and will not be stored at home. NOTE: This sheet is a summary. It may not cover all possible information. If you have questions about this medicine, talk to your doctor, pharmacist, or health care provider.  2020 Elsevier/Gold Standard (2017-02-26 14:07:13)  

## 2019-02-02 NOTE — Patient Instructions (Signed)
Implanted Port Insertion, Care After This sheet gives you information about how to care for yourself after your procedure. Your health care provider may also give you more specific instructions. If you have problems or questions, contact your health care provider. What can I expect after the procedure? After the procedure, it is common to have:  Discomfort at the port insertion site.  Bruising on the skin over the port. This should improve over 3-4 days. Follow these instructions at home: Port care  After your port is placed, you will get a manufacturer's information card. The card has information about your port. Keep this card with you at all times.  Take care of the port as told by your health care provider. Ask your health care provider if you or a family member can get training for taking care of the port at home. A home health care nurse may also take care of the port.  Make sure to remember what type of port you have. Incision care      Follow instructions from your health care provider about how to take care of your port insertion site. Make sure you: ? Wash your hands with soap and water before and after you change your bandage (dressing). If soap and water are not available, use hand sanitizer. ? Change your dressing as told by your health care provider. ? Leave stitches (sutures), skin glue, or adhesive strips in place. These skin closures may need to stay in place for 2 weeks or longer. If adhesive strip edges start to loosen and curl up, you may trim the loose edges. Do not remove adhesive strips completely unless your health care provider tells you to do that.  Check your port insertion site every day for signs of infection. Check for: ? Redness, swelling, or pain. ? Fluid or blood. ? Warmth. ? Pus or a bad smell. Activity  Return to your normal activities as told by your health care provider. Ask your health care provider what activities are safe for you.  Do not  lift anything that is heavier than 10 lb (4.5 kg), or the limit that you are told, until your health care provider says that it is safe. General instructions  Take over-the-counter and prescription medicines only as told by your health care provider.  Do not take baths, swim, or use a hot tub until your health care provider approves. Ask your health care provider if you may take showers. You may only be allowed to take sponge baths.  Do not drive for 24 hours if you were given a sedative during your procedure.  Wear a medical alert bracelet in case of an emergency. This will tell any health care providers that you have a port.  Keep all follow-up visits as told by your health care provider. This is important. Contact a health care provider if:  You cannot flush your port with saline as directed, or you cannot draw blood from the port.  You have a fever or chills.  You have redness, swelling, or pain around your port insertion site.  You have fluid or blood coming from your port insertion site.  Your port insertion site feels warm to the touch.  You have pus or a bad smell coming from the port insertion site. Get help right away if:  You have chest pain or shortness of breath.  You have bleeding from your port that you cannot control. Summary  Take care of the port as told by your health   care provider. Keep the manufacturer's information card with you at all times.  Change your dressing as told by your health care provider.  Contact a health care provider if you have a fever or chills or if you have redness, swelling, or pain around your port insertion site.  Keep all follow-up visits as told by your health care provider. This information is not intended to replace advice given to you by your health care provider. Make sure you discuss any questions you have with your health care provider. Document Released: 03/03/2013 Document Revised: 12/09/2017 Document Reviewed: 12/09/2017  Elsevier Patient Education  2020 [employer]  

## 2019-02-03 DIAGNOSIS — C9000 Multiple myeloma not having achieved remission: Secondary | ICD-10-CM | POA: Diagnosis not present

## 2019-02-03 DIAGNOSIS — I10 Essential (primary) hypertension: Secondary | ICD-10-CM | POA: Diagnosis not present

## 2019-02-03 DIAGNOSIS — E109 Type 1 diabetes mellitus without complications: Secondary | ICD-10-CM | POA: Diagnosis not present

## 2019-02-08 DIAGNOSIS — Z23 Encounter for immunization: Secondary | ICD-10-CM | POA: Diagnosis not present

## 2019-02-15 ENCOUNTER — Encounter: Payer: Self-pay | Admitting: Hematology & Oncology

## 2019-02-15 ENCOUNTER — Ambulatory Visit: Payer: Medicare Other | Admitting: Hematology & Oncology

## 2019-02-15 ENCOUNTER — Inpatient Hospital Stay: Payer: Medicare Other

## 2019-02-15 ENCOUNTER — Other Ambulatory Visit: Payer: Medicare Other

## 2019-02-15 ENCOUNTER — Other Ambulatory Visit: Payer: Self-pay

## 2019-02-15 ENCOUNTER — Ambulatory Visit: Payer: Medicare Other

## 2019-02-15 ENCOUNTER — Other Ambulatory Visit: Payer: Self-pay | Admitting: *Deleted

## 2019-02-15 ENCOUNTER — Inpatient Hospital Stay (HOSPITAL_BASED_OUTPATIENT_CLINIC_OR_DEPARTMENT_OTHER): Payer: Medicare Other | Admitting: Hematology & Oncology

## 2019-02-15 VITALS — BP 151/75 | HR 69 | Temp 97.8°F | Resp 18

## 2019-02-15 VITALS — BP 129/66 | HR 74 | Temp 97.1°F | Resp 18 | Wt 202.0 lb

## 2019-02-15 DIAGNOSIS — C9000 Multiple myeloma not having achieved remission: Secondary | ICD-10-CM | POA: Diagnosis not present

## 2019-02-15 DIAGNOSIS — D801 Nonfamilial hypogammaglobulinemia: Secondary | ICD-10-CM | POA: Diagnosis not present

## 2019-02-15 DIAGNOSIS — N182 Chronic kidney disease, stage 2 (mild): Secondary | ICD-10-CM

## 2019-02-15 DIAGNOSIS — E1022 Type 1 diabetes mellitus with diabetic chronic kidney disease: Secondary | ICD-10-CM

## 2019-02-15 DIAGNOSIS — Z79899 Other long term (current) drug therapy: Secondary | ICD-10-CM | POA: Diagnosis not present

## 2019-02-15 DIAGNOSIS — Z5112 Encounter for antineoplastic immunotherapy: Secondary | ICD-10-CM | POA: Diagnosis not present

## 2019-02-15 DIAGNOSIS — C9002 Multiple myeloma in relapse: Secondary | ICD-10-CM | POA: Diagnosis not present

## 2019-02-15 DIAGNOSIS — Z9484 Stem cells transplant status: Secondary | ICD-10-CM | POA: Diagnosis not present

## 2019-02-15 DIAGNOSIS — Z794 Long term (current) use of insulin: Secondary | ICD-10-CM | POA: Diagnosis not present

## 2019-02-15 LAB — CMP (CANCER CENTER ONLY)
ALT: 25 U/L (ref 0–44)
AST: 20 U/L (ref 15–41)
Albumin: 3.9 g/dL (ref 3.5–5.0)
Alkaline Phosphatase: 43 U/L (ref 38–126)
Anion gap: 7 (ref 5–15)
BUN: 18 mg/dL (ref 8–23)
CO2: 25 mmol/L (ref 22–32)
Calcium: 8.8 mg/dL — ABNORMAL LOW (ref 8.9–10.3)
Chloride: 106 mmol/L (ref 98–111)
Creatinine: 1.02 mg/dL (ref 0.61–1.24)
GFR, Est AFR Am: >60 mL/min (ref >60)
GFR, Estimated: >60 mL/min (ref >60)
Glucose, Bld: 209 mg/dL — ABNORMAL HIGH (ref 70–99)
Potassium: 4 mmol/L (ref 3.5–5.1)
Sodium: 138 mmol/L (ref 135–145)
Total Bilirubin: 1 mg/dL (ref 0.3–1.2)
Total Protein: 5.7 g/dL — ABNORMAL LOW (ref 6.5–8.1)

## 2019-02-15 LAB — CBC WITH DIFFERENTIAL (CANCER CENTER ONLY)
Abs Immature Granulocytes: 0.01 10*3/uL (ref 0.00–0.07)
Basophils Absolute: 0.1 10*3/uL (ref 0.0–0.1)
Basophils Relative: 2 %
Eosinophils Absolute: 0.1 10*3/uL (ref 0.0–0.5)
Eosinophils Relative: 2 %
HCT: 45.3 % (ref 39.0–52.0)
Hemoglobin: 15.4 g/dL (ref 13.0–17.0)
Immature Granulocytes: 0 %
Lymphocytes Relative: 26 %
Lymphs Abs: 1.1 10*3/uL (ref 0.7–4.0)
MCH: 36.3 pg — ABNORMAL HIGH (ref 26.0–34.0)
MCHC: 34 g/dL (ref 30.0–36.0)
MCV: 106.8 fL — ABNORMAL HIGH (ref 80.0–100.0)
Monocytes Absolute: 0.7 10*3/uL (ref 0.1–1.0)
Monocytes Relative: 18 %
Neutro Abs: 2.2 10*3/uL (ref 1.7–7.7)
Neutrophils Relative %: 52 %
Platelet Count: 198 10*3/uL (ref 150–400)
RBC: 4.24 MIL/uL (ref 4.22–5.81)
RDW: 12.7 % (ref 11.5–15.5)
WBC Count: 4.1 10*3/uL (ref 4.0–10.5)
nRBC: 0 % (ref 0.0–0.2)

## 2019-02-15 LAB — HEMOGLOBIN A1C
Hgb A1c MFr Bld: 4.8 % (ref 4.8–5.6)
Mean Plasma Glucose: 91.06 mg/dL

## 2019-02-15 MED ORDER — SODIUM CHLORIDE 0.9 % IV SOLN
Freq: Once | INTRAVENOUS | Status: DC
  Filled 2019-02-15: qty 250

## 2019-02-15 MED ORDER — HEPARIN SOD (PORK) LOCK FLUSH 100 UNIT/ML IV SOLN
500.0000 [IU] | Freq: Once | INTRAVENOUS | Status: AC | PRN
  Administered 2019-02-15: 500 [IU]
  Filled 2019-02-15: qty 5

## 2019-02-15 MED ORDER — DIPHENHYDRAMINE HCL 25 MG PO CAPS
50.0000 mg | ORAL_CAPSULE | Freq: Once | ORAL | Status: AC
  Administered 2019-02-15: 50 mg via ORAL

## 2019-02-15 MED ORDER — ACETAMINOPHEN 325 MG PO TABS
650.0000 mg | ORAL_TABLET | Freq: Once | ORAL | Status: AC
  Administered 2019-02-15: 650 mg via ORAL

## 2019-02-15 MED ORDER — PROCHLORPERAZINE MALEATE 10 MG PO TABS
ORAL_TABLET | ORAL | Status: AC
  Filled 2019-02-15: qty 1

## 2019-02-15 MED ORDER — DIPHENHYDRAMINE HCL 25 MG PO CAPS
ORAL_CAPSULE | ORAL | Status: AC
  Filled 2019-02-15: qty 2

## 2019-02-15 MED ORDER — PROCHLORPERAZINE MALEATE 10 MG PO TABS
10.0000 mg | ORAL_TABLET | Freq: Once | ORAL | Status: AC
  Administered 2019-02-15: 10 mg via ORAL

## 2019-02-15 MED ORDER — ACETAMINOPHEN 325 MG PO TABS
ORAL_TABLET | ORAL | Status: AC
  Filled 2019-02-15: qty 2

## 2019-02-15 MED ORDER — DEXTROSE 5 % IV SOLN
INTRAVENOUS | Status: DC
  Administered 2019-02-15: 12:00:00 via INTRAVENOUS
  Filled 2019-02-15 (×2): qty 250

## 2019-02-15 MED ORDER — DEXTROSE 5 % IV SOLN
71.0000 mg/m2 | Freq: Once | INTRAVENOUS | Status: AC
  Administered 2019-02-15: 150 mg via INTRAVENOUS
  Filled 2019-02-15: qty 75

## 2019-02-15 MED ORDER — SODIUM CHLORIDE 0.9% FLUSH
10.0000 mL | INTRAVENOUS | Status: DC | PRN
  Administered 2019-02-15: 10 mL
  Filled 2019-02-15: qty 10

## 2019-02-15 MED ORDER — DEXAMETHASONE SODIUM PHOSPHATE 10 MG/ML IJ SOLN
INTRAMUSCULAR | Status: AC
  Filled 2019-02-15: qty 1

## 2019-02-15 MED ORDER — SODIUM CHLORIDE 0.9 % IV SOLN
20.0000 mg | Freq: Once | INTRAVENOUS | Status: AC
  Administered 2019-02-15: 20 mg via INTRAVENOUS
  Filled 2019-02-15: qty 20

## 2019-02-15 MED ORDER — IMMUNE GLOBULIN (HUMAN) 20 GM/200ML IV SOLN
40.0000 g | Freq: Once | INTRAVENOUS | Status: AC
  Administered 2019-02-15: 40 g via INTRAVENOUS
  Filled 2019-02-15: qty 400

## 2019-02-15 NOTE — Progress Notes (Signed)
Hematology and Oncology Follow Up Visit  NANG HAVEL [id number] 11-04-1940 78 y.o. 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 Elotuzumab/Pomalidomide/decadron -every 4 weeks- start 01/12/2018 Kyprolis/Pomalidomide/Decadron -- s/p cycle #4 -- start 04/06/2018 IVIG 40 mg IV q month   Interim History:  Mr. Loretto is here today for follow-up.  So far, everything is going well with him.  [place]  [place] has been [place] in the mountains.  It was down to 34 degrees tonight.  Hopefully, it will not get colder later on that might affect his flowers.  He has a really, really nice garden.    He has been off the pomalidomide.  He says he does feel a little bit better being off pomalidomide.  His last kappa light chain was holding relatively steady at 3.1 mg/dL.  We are giving him IVIG.  I think this will help with his immunity.  His last IgG level was only 525 mg/dL.  His blood sugars are doing pretty well.  They have not been all that high.  He really does a good job in maintaining his blood sugars.  There is been no fever.  He has had no cough.  He has had no change in bowel or bladder habits.  Overall, I would have to say that his performance status is ECOG 1.     Medications:  Allergies as of 02/15/2019   No Known Allergies     Medication List       Accurate as of February 15, 2019 11:42 AM. If you have any questions, ask your nurse or doctor.        acyclovir 400 MG tablet Commonly known as: ZOVIRAX TAKE 1 TABLET BY MOUTH TWICE DAILY   aspirin 81 MG tablet Take 81 mg by mouth daily.   atorvastatin 10 MG tablet Commonly known as: LIPITOR Take 10 mg by mouth daily.     calcium-vitamin D 250-125 MG-UNIT tablet Commonly known as: OSCAL Take 1 tablet by mouth daily.   cycloSPORINE 0.05 % ophthalmic emulsion Commonly known as: RESTASIS Place 1 drop into both eyes 2 (two) times daily.   fish oil-omega-3 fatty acids 1000 MG capsule Take 1 g by mouth 2 (two) times daily.   INSULIN LISP & LISP PROT (HUM) [place]   insulin lispro 100 UNIT/ML injection Commonly known as: HUMALOG Inject into the skin. Insulin pump 12/21/2018 Typically 80U daily.   irbesartan-hydrochlorothiazide 300-12.5 MG tablet Commonly known as: AVALIDE Take 1 tablet by mouth daily.   lidocaine-prilocaine cream Commonly known as: EMLA Apply 1 application topically as needed.   montelukast 10 MG tablet Commonly known as: SINGULAIR Take 10 mg by mouth at bedtime.   Myrbetriq 50 MG Tb24 tablet Generic drug: mirabegron ER Take 50 mg by mouth daily.   ondansetron 4 MG disintegrating tablet Commonly known as: ZOFRAN-ODT Take 4 mg by mouth 4 (four) times daily as needed for nausea or vomiting.   pomalidomide 4 MG capsule Commonly known as: POMALYST Take 1 capsule (4 mg total) by mouth daily. Take with water on days 1-21. Repeat every 28 days. [id number]   Vitamin D 50 MCG (2000 UT) Caps Take by  mouth every morning.       Allergies: No Known Allergies  Past Medical History, Surgical history, Social history, and Family History were reviewed and updated.  Review of Systems: Review of Systems  Constitutional: Negative.   HENT: Negative.   Eyes: Negative.   Respiratory: Negative.   Cardiovascular: Negative.   Gastrointestinal: Negative.   Genitourinary: Negative.   Musculoskeletal: Negative.   Skin: Negative.   Neurological: Negative.   Endo/Heme/Allergies: Negative.   Psychiatric/Behavioral: Negative.      Physical Exam:  weight is 202 lb (91.6 kg). His temporal temperature is 97.1 F (36.2 C) (abnormal). His blood pressure is 129/66 and his pulse is 74. His  respiration is 18 and oxygen saturation is 100%.   Wt Readings from Last 3 Encounters:  02/15/19 202 lb (91.6 kg)  01/18/19 202 lb 4 oz (91.7 kg)  12/21/18 205 lb (93 kg)    Physical Exam Vitals signs reviewed.  HENT:     Head: Normocephalic and atraumatic.  Eyes:     Pupils: Pupils are equal, round, and reactive to light.  Neck:     Musculoskeletal: Normal range of motion.  Cardiovascular:     Rate and Rhythm: Normal rate and regular rhythm.     Heart sounds: Normal heart sounds.  Pulmonary:     Effort: Pulmonary effort is normal.     Breath sounds: Normal breath sounds.  Abdominal:     General: Bowel sounds are normal.     Palpations: Abdomen is soft.  Musculoskeletal: Normal range of motion.        General: No tenderness or deformity.  Lymphadenopathy:     Cervical: No cervical adenopathy.  Skin:    General: Skin is warm and dry.     Findings: No erythema or rash.  Neurological:     Mental Status: He is alert and oriented to person, place, and time.  Psychiatric:        Behavior: Behavior normal.        Thought Content: Thought content normal.        Judgment: Judgment normal.      Lab Results  Component Value Date   WBC 4.1 02/15/2019   HGB 15.4 02/15/2019   HCT 45.3 02/15/2019   MCV 106.8 (H) 02/15/2019   PLT 198 02/15/2019   Lab Results  Component Value Date   FERRITIN 258 12/28/2018   IRON 98 12/28/2018   TIBC 253 12/28/2018   UIBC 154 12/28/2018   IRONPCTSAT 39 12/28/2018   Lab Results  Component Value Date   RBC 4.24 02/15/2019   Lab Results  Component Value Date   KPAFRELGTCHN 31.2 (H) 01/18/2019   LAMBDASER <1.5 (L) 01/18/2019   KAPLAMBRATIO >20.80 (H) 01/18/2019   Lab Results  Component Value Date   IGGSERUM 525 (L) 01/18/2019   IGA 9 (L) 01/18/2019   IGMSERUM <5 (L) 01/18/2019   Lab Results  Component Value Date   TOTALPROTELP 5.6 (L) 01/18/2019   ALBUMINELP 3.6 01/18/2019   A1GS 0.2 01/18/2019   A2GS 0.7 01/18/2019   BETS  0.7 01/18/2019   BETA2SER 0.3 04/19/2015   GAMS 0.4 01/18/2019   MSPIKE Not Observed 01/18/2019   SPEI Comment 11/23/2018     Chemistry      Component Value Date/Time   NA 138 02/15/2019 1020   NA 142 04/23/2017 1431   NA 139 07/18/2016 1135   K 4.0 02/15/2019 1020   K 4.0 04/23/2017 1431   K 4.4 07/18/2016 1135  CL 106 02/15/2019 1020   CL 105 04/23/2017 1431   CO2 25 02/15/2019 1020   CO2 28 04/23/2017 1431   CO2 27 07/18/2016 1135   BUN 18 02/15/2019 1020   BUN 20 04/23/2017 1431   BUN 12.2 07/18/2016 1135   CREATININE 1.02 02/15/2019 1020   CREATININE 1.2 04/23/2017 1431   CREATININE 0.9 07/18/2016 1135      Component Value Date/Time   CALCIUM 8.8 (L) 02/15/2019 1020   CALCIUM 9.3 04/23/2017 1431   CALCIUM 8.9 07/18/2016 1135   ALKPHOS 43 02/15/2019 1020   ALKPHOS 64 04/23/2017 1431   ALKPHOS 58 07/18/2016 1135   AST 20 02/15/2019 1020   AST 15 07/18/2016 1135   ALT 25 02/15/2019 1020   ALT 22 04/23/2017 1431   ALT 20 07/18/2016 1135   BILITOT 1.0 02/15/2019 1020   BILITOT 1.24 (H) 07/18/2016 1135      Impression and Plan: Mr. 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   Hopefully, the light chain results will hold steady.  If they do hold steady, then we might be able to move his appointments out to every 2 weeks.  I think if we do find that the light chains are going back up, we may have to consider him for either CAR-T therapy or see if he would qualify for the protocol utilizing the new agent-belantamab.  Overall, his quality of life is really what is important.  He is really done a good job with this.  His blood sugars have not been a problem.  I think the IVIG will continue to help.  We will plan to get him back to see [place] in another month.       [name], MD 9/21/202011:42 AM

## 2019-02-15 NOTE — Patient Instructions (Signed)

## 2019-02-16 LAB — KAPPA/LAMBDA LIGHT CHAINS
Kappa free light chain: 34 mg/L — ABNORMAL HIGH (ref 3.3–19.4)
Kappa, lambda light chain ratio: 17 — ABNORMAL HIGH (ref 0.26–1.65)
Lambda free light chains: 2 mg/L — ABNORMAL LOW (ref 5.7–26.3)

## 2019-02-16 LAB — IGG, IGA, IGM
IgA: 6 mg/dL — ABNORMAL LOW (ref 61–437)
IgG (Immunoglobin G), Serum: 379 mg/dL — ABNORMAL LOW (ref 603–1613)
IgM (Immunoglobulin M), Srm: 8 mg/dL — ABNORMAL LOW (ref 15–143)

## 2019-02-17 LAB — PROTEIN ELECTROPHORESIS, SERUM, WITH REFLEX
A/G Ratio: 2.2 — ABNORMAL HIGH (ref 0.7–1.7)
Albumin ELP: 3.5 g/dL (ref 2.9–4.4)
Alpha-1-Globulin: 0.2 g/dL (ref 0.0–0.4)
Alpha-2-Globulin: 0.6 g/dL (ref 0.4–1.0)
Beta Globulin: 0.6 g/dL — ABNORMAL LOW (ref 0.7–1.3)
Gamma Globulin: 0.2 g/dL — ABNORMAL LOW (ref 0.4–1.8)
Globulin, Total: 1.6 g/dL — ABNORMAL LOW (ref 2.2–3.9)
Total Protein ELP: 5.1 g/dL — ABNORMAL LOW (ref 6.0–8.5)

## 2019-02-19 ENCOUNTER — Other Ambulatory Visit: Payer: Self-pay | Admitting: *Deleted

## 2019-02-19 DIAGNOSIS — C9000 Multiple myeloma not having achieved remission: Secondary | ICD-10-CM

## 2019-02-22 ENCOUNTER — Inpatient Hospital Stay: Payer: Medicare Other

## 2019-02-22 ENCOUNTER — Other Ambulatory Visit: Payer: Self-pay

## 2019-02-22 DIAGNOSIS — Z5112 Encounter for antineoplastic immunotherapy: Secondary | ICD-10-CM | POA: Diagnosis not present

## 2019-02-22 DIAGNOSIS — C9002 Multiple myeloma in relapse: Secondary | ICD-10-CM | POA: Diagnosis not present

## 2019-02-22 DIAGNOSIS — Z9484 Stem cells transplant status: Secondary | ICD-10-CM | POA: Diagnosis not present

## 2019-02-22 DIAGNOSIS — C9000 Multiple myeloma not having achieved remission: Secondary | ICD-10-CM

## 2019-02-22 DIAGNOSIS — Z794 Long term (current) use of insulin: Secondary | ICD-10-CM | POA: Diagnosis not present

## 2019-02-22 DIAGNOSIS — D801 Nonfamilial hypogammaglobulinemia: Secondary | ICD-10-CM | POA: Diagnosis not present

## 2019-02-22 DIAGNOSIS — Z79899 Other long term (current) drug therapy: Secondary | ICD-10-CM | POA: Diagnosis not present

## 2019-02-22 LAB — CBC WITH DIFFERENTIAL (CANCER CENTER ONLY)
Abs Immature Granulocytes: 0.01 10*3/uL (ref 0.00–0.07)
Basophils Absolute: 0 10*3/uL (ref 0.0–0.1)
Basophils Relative: 1 %
Eosinophils Absolute: 0.1 10*3/uL (ref 0.0–0.5)
Eosinophils Relative: 1 %
HCT: 43 % (ref 39.0–52.0)
Hemoglobin: 14.6 g/dL (ref 13.0–17.0)
Immature Granulocytes: 0 %
Lymphocytes Relative: 30 %
Lymphs Abs: 1.1 10*3/uL (ref 0.7–4.0)
MCH: 35.4 pg — ABNORMAL HIGH (ref 26.0–34.0)
MCHC: 34 g/dL (ref 30.0–36.0)
MCV: 104.4 fL — ABNORMAL HIGH (ref 80.0–100.0)
Monocytes Absolute: 0.7 10*3/uL (ref 0.1–1.0)
Monocytes Relative: 20 %
Neutro Abs: 1.8 10*3/uL (ref 1.7–7.7)
Neutrophils Relative %: 48 %
Platelet Count: 70 10*3/uL — ABNORMAL LOW (ref 150–400)
RBC: 4.12 MIL/uL — ABNORMAL LOW (ref 4.22–5.81)
RDW: 12.4 % (ref 11.5–15.5)
WBC Count: 3.6 10*3/uL — ABNORMAL LOW (ref 4.0–10.5)
nRBC: 0 % (ref 0.0–0.2)

## 2019-02-22 LAB — COMPREHENSIVE METABOLIC PANEL
ALT: 20 U/L (ref 0–44)
AST: 19 U/L (ref 15–41)
Albumin: 3.7 g/dL (ref 3.5–5.0)
Alkaline Phosphatase: 39 U/L (ref 38–126)
Anion gap: 6 (ref 5–15)
BUN: 23 mg/dL (ref 8–23)
CO2: 25 mmol/L (ref 22–32)
Calcium: 9.1 mg/dL (ref 8.9–10.3)
Chloride: 106 mmol/L (ref 98–111)
Creatinine, Ser: 1.09 mg/dL (ref 0.61–1.24)
GFR calc Af Amer: >60 mL/min (ref >60)
GFR calc non Af Amer: >60 mL/min (ref >60)
Glucose, Bld: 238 mg/dL — ABNORMAL HIGH (ref 70–99)
Potassium: 4.6 mmol/L (ref 3.5–5.1)
Sodium: 137 mmol/L (ref 135–145)
Total Bilirubin: 1 mg/dL (ref 0.3–1.2)
Total Protein: 6.1 g/dL — ABNORMAL LOW (ref 6.5–8.1)

## 2019-02-22 NOTE — Progress Notes (Signed)
Per dr Marin Olp no treatment today d/t low platelet count of 70. Return 1 week for lab and chemo. Patient verbalized understanding.

## 2019-02-22 NOTE — Patient Instructions (Signed)
Implanted Port Insertion, Care After This sheet gives you information about how to care for yourself after your procedure. Your health care provider may also give you more specific instructions. If you have problems or questions, contact your health care provider. What can I expect after the procedure? After the procedure, it is common to have:  Discomfort at the port insertion site.  Bruising on the skin over the port. This should improve over 3-4 days. Follow these instructions at home: Port care  After your port is placed, you will get a manufacturer's information card. The card has information about your port. Keep this card with you at all times.  Take care of the port as told by your health care provider. Ask your health care provider if you or a family member can get training for taking care of the port at home. A home health care nurse may also take care of the port.  Make sure to remember what type of port you have. Incision care      Follow instructions from your health care provider about how to take care of your port insertion site. Make sure you: ? Wash your hands with soap and water before and after you change your bandage (dressing). If soap and water are not available, use hand sanitizer. ? Change your dressing as told by your health care provider. ? Leave stitches (sutures), skin glue, or adhesive strips in place. These skin closures may need to stay in place for 2 weeks or longer. If adhesive strip edges start to loosen and curl up, you may trim the loose edges. Do not remove adhesive strips completely unless your health care provider tells you to do that.  Check your port insertion site every day for signs of infection. Check for: ? Redness, swelling, or pain. ? Fluid or blood. ? Warmth. ? Pus or a bad smell. Activity  Return to your normal activities as told by your health care provider. Ask your health care provider what activities are safe for you.  Do not  lift anything that is heavier than 10 lb (4.5 kg), or the limit that you are told, until your health care provider says that it is safe. General instructions  Take over-the-counter and prescription medicines only as told by your health care provider.  Do not take baths, swim, or use a hot tub until your health care provider approves. Ask your health care provider if you may take showers. You may only be allowed to take sponge baths.  Do not drive for 24 hours if you were given a sedative during your procedure.  Wear a medical alert bracelet in case of an emergency. This will tell any health care providers that you have a port.  Keep all follow-up visits as told by your health care provider. This is important. Contact a health care provider if:  You cannot flush your port with saline as directed, or you cannot draw blood from the port.  You have a fever or chills.  You have redness, swelling, or pain around your port insertion site.  You have fluid or blood coming from your port insertion site.  Your port insertion site feels warm to the touch.  You have pus or a bad smell coming from the port insertion site. Get help right away if:  You have chest pain or shortness of breath.  You have bleeding from your port that you cannot control. Summary  Take care of the port as told by your health   care provider. Keep the manufacturer's information card with you at all times.  Change your dressing as told by your health care provider.  Contact a health care provider if you have a fever or chills or if you have redness, swelling, or pain around your port insertion site.  Keep all follow-up visits as told by your health care provider. This information is not intended to replace advice given to you by your health care provider. Make sure you discuss any questions you have with your health care provider. Document Released: 03/03/2013 Document Revised: 12/09/2017 Document Reviewed: 12/09/2017  Elsevier Patient Education  2020 [employer]  

## 2019-02-26 ENCOUNTER — Other Ambulatory Visit: Payer: Self-pay | Admitting: *Deleted

## 2019-02-26 DIAGNOSIS — E1022 Type 1 diabetes mellitus with diabetic chronic kidney disease: Secondary | ICD-10-CM

## 2019-02-26 DIAGNOSIS — C9000 Multiple myeloma not having achieved remission: Secondary | ICD-10-CM

## 2019-03-01 ENCOUNTER — Inpatient Hospital Stay: Payer: Medicare Other | Attending: Hematology & Oncology

## 2019-03-01 ENCOUNTER — Inpatient Hospital Stay: Payer: Medicare Other

## 2019-03-01 ENCOUNTER — Other Ambulatory Visit: Payer: Self-pay

## 2019-03-01 DIAGNOSIS — N182 Chronic kidney disease, stage 2 (mild): Secondary | ICD-10-CM

## 2019-03-01 DIAGNOSIS — Z5112 Encounter for antineoplastic immunotherapy: Secondary | ICD-10-CM | POA: Diagnosis not present

## 2019-03-01 DIAGNOSIS — C9002 Multiple myeloma in relapse: Secondary | ICD-10-CM | POA: Diagnosis not present

## 2019-03-01 DIAGNOSIS — C9000 Multiple myeloma not having achieved remission: Secondary | ICD-10-CM

## 2019-03-01 DIAGNOSIS — E1022 Type 1 diabetes mellitus with diabetic chronic kidney disease: Secondary | ICD-10-CM

## 2019-03-01 LAB — CMP (CANCER CENTER ONLY)
ALT: 28 U/L (ref 0–44)
AST: 24 U/L (ref 15–41)
Albumin: 4 g/dL (ref 3.5–5.0)
Alkaline Phosphatase: 45 U/L (ref 38–126)
Anion gap: 7 (ref 5–15)
BUN: 21 mg/dL (ref 8–23)
CO2: 26 mmol/L (ref 22–32)
Calcium: 9.1 mg/dL (ref 8.9–10.3)
Chloride: 105 mmol/L (ref 98–111)
Creatinine: 1.01 mg/dL (ref 0.61–1.24)
GFR, Est AFR Am: >60 mL/min (ref >60)
GFR, Estimated: >60 mL/min (ref >60)
Glucose, Bld: 269 mg/dL — ABNORMAL HIGH (ref 70–99)
Potassium: 4.2 mmol/L (ref 3.5–5.1)
Sodium: 138 mmol/L (ref 135–145)
Total Bilirubin: 0.9 mg/dL (ref 0.3–1.2)
Total Protein: 6.2 g/dL — ABNORMAL LOW (ref 6.5–8.1)

## 2019-03-01 LAB — CBC WITH DIFFERENTIAL (CANCER CENTER ONLY)
Abs Immature Granulocytes: 0.01 10*3/uL (ref 0.00–0.07)
Basophils Absolute: 0.1 10*3/uL (ref 0.0–0.1)
Basophils Relative: 2 %
Eosinophils Absolute: 0 10*3/uL (ref 0.0–0.5)
Eosinophils Relative: 1 %
HCT: 45.7 % (ref 39.0–52.0)
Hemoglobin: 15.3 g/dL (ref 13.0–17.0)
Immature Granulocytes: 0 %
Lymphocytes Relative: 37 %
Lymphs Abs: 1.4 10*3/uL (ref 0.7–4.0)
MCH: 35.2 pg — ABNORMAL HIGH (ref 26.0–34.0)
MCHC: 33.5 g/dL (ref 30.0–36.0)
MCV: 105.1 fL — ABNORMAL HIGH (ref 80.0–100.0)
Monocytes Absolute: 0.6 10*3/uL (ref 0.1–1.0)
Monocytes Relative: 17 %
Neutro Abs: 1.7 10*3/uL (ref 1.7–7.7)
Neutrophils Relative %: 43 %
Platelet Count: 140 10*3/uL — ABNORMAL LOW (ref 150–400)
RBC: 4.35 MIL/uL (ref 4.22–5.81)
RDW: 12.6 % (ref 11.5–15.5)
WBC Count: 3.8 10*3/uL — ABNORMAL LOW (ref 4.0–10.5)
nRBC: 0 % (ref 0.0–0.2)

## 2019-03-01 MED ORDER — SODIUM CHLORIDE 0.9 % IV SOLN
Freq: Once | INTRAVENOUS | Status: DC
  Filled 2019-03-01: qty 250

## 2019-03-01 MED ORDER — ACETAMINOPHEN 325 MG PO TABS
650.0000 mg | ORAL_TABLET | Freq: Once | ORAL | Status: AC
  Administered 2019-03-01: 650 mg via ORAL

## 2019-03-01 MED ORDER — ACETAMINOPHEN 325 MG PO TABS
ORAL_TABLET | ORAL | Status: AC
  Filled 2019-03-01: qty 2

## 2019-03-01 MED ORDER — PROCHLORPERAZINE MALEATE 10 MG PO TABS
10.0000 mg | ORAL_TABLET | Freq: Once | ORAL | Status: AC
  Administered 2019-03-01: 10 mg via ORAL

## 2019-03-01 MED ORDER — SODIUM CHLORIDE 0.9% FLUSH
10.0000 mL | INTRAVENOUS | Status: DC | PRN
  Administered 2019-03-01: 10 mL
  Filled 2019-03-01: qty 10

## 2019-03-01 MED ORDER — DEXTROSE 5 % IV SOLN
71.0000 mg/m2 | Freq: Once | INTRAVENOUS | Status: AC
  Administered 2019-03-01: 150 mg via INTRAVENOUS
  Filled 2019-03-01: qty 15

## 2019-03-01 MED ORDER — SODIUM CHLORIDE 0.9 % IV SOLN
20.0000 mg | Freq: Once | INTRAVENOUS | Status: AC
  Administered 2019-03-01: 20 mg via INTRAVENOUS
  Filled 2019-03-01: qty 20

## 2019-03-01 MED ORDER — SODIUM CHLORIDE 0.9 % IV SOLN
Freq: Once | INTRAVENOUS | Status: AC
  Administered 2019-03-01: 12:00:00 via INTRAVENOUS
  Filled 2019-03-01: qty 250

## 2019-03-01 MED ORDER — HEPARIN SOD (PORK) LOCK FLUSH 100 UNIT/ML IV SOLN
500.0000 [IU] | Freq: Once | INTRAVENOUS | Status: AC | PRN
  Administered 2019-03-01: 500 [IU]
  Filled 2019-03-01: qty 5

## 2019-03-01 MED ORDER — DIPHENHYDRAMINE HCL 25 MG PO CAPS
50.0000 mg | ORAL_CAPSULE | Freq: Once | ORAL | Status: AC
  Administered 2019-03-01: 50 mg via ORAL

## 2019-03-01 MED ORDER — DIPHENHYDRAMINE HCL 25 MG PO CAPS
ORAL_CAPSULE | ORAL | Status: AC
  Filled 2019-03-01: qty 2

## 2019-03-12 ENCOUNTER — Other Ambulatory Visit: Payer: Self-pay | Admitting: Hematology & Oncology

## 2019-03-15 ENCOUNTER — Inpatient Hospital Stay: Payer: Medicare Other

## 2019-03-15 ENCOUNTER — Other Ambulatory Visit: Payer: Self-pay

## 2019-03-15 ENCOUNTER — Encounter: Payer: Self-pay | Admitting: Family

## 2019-03-15 ENCOUNTER — Inpatient Hospital Stay (HOSPITAL_BASED_OUTPATIENT_CLINIC_OR_DEPARTMENT_OTHER): Payer: Medicare Other | Admitting: Family

## 2019-03-15 VITALS — BP 117/63 | HR 75 | Temp 97.1°F | Resp 18

## 2019-03-15 DIAGNOSIS — Z5112 Encounter for antineoplastic immunotherapy: Secondary | ICD-10-CM | POA: Diagnosis not present

## 2019-03-15 DIAGNOSIS — C9000 Multiple myeloma not having achieved remission: Secondary | ICD-10-CM

## 2019-03-15 DIAGNOSIS — D801 Nonfamilial hypogammaglobulinemia: Secondary | ICD-10-CM

## 2019-03-15 DIAGNOSIS — C9002 Multiple myeloma in relapse: Secondary | ICD-10-CM | POA: Diagnosis not present

## 2019-03-15 LAB — CBC WITH DIFFERENTIAL (CANCER CENTER ONLY)
Abs Immature Granulocytes: 0.01 10*3/uL (ref 0.00–0.07)
Basophils Absolute: 0 10*3/uL (ref 0.0–0.1)
Basophils Relative: 1 %
Eosinophils Absolute: 0.1 10*3/uL (ref 0.0–0.5)
Eosinophils Relative: 1 %
HCT: 46.1 % (ref 39.0–52.0)
Hemoglobin: 15.8 g/dL (ref 13.0–17.0)
Immature Granulocytes: 0 %
Lymphocytes Relative: 32 %
Lymphs Abs: 1.3 10*3/uL (ref 0.7–4.0)
MCH: 35.7 pg — ABNORMAL HIGH (ref 26.0–34.0)
MCHC: 34.3 g/dL (ref 30.0–36.0)
MCV: 104.3 fL — ABNORMAL HIGH (ref 80.0–100.0)
Monocytes Absolute: 0.6 10*3/uL (ref 0.1–1.0)
Monocytes Relative: 15 %
Neutro Abs: 2.1 10*3/uL (ref 1.7–7.7)
Neutrophils Relative %: 51 %
Platelet Count: 153 10*3/uL (ref 150–400)
RBC: 4.42 MIL/uL (ref 4.22–5.81)
RDW: 12.7 % (ref 11.5–15.5)
WBC Count: 4.2 10*3/uL (ref 4.0–10.5)
nRBC: 0 % (ref 0.0–0.2)

## 2019-03-15 LAB — CMP (CANCER CENTER ONLY)
ALT: 27 U/L (ref 0–44)
AST: 23 U/L (ref 15–41)
Albumin: 4.1 g/dL (ref 3.5–5.0)
Alkaline Phosphatase: 44 U/L (ref 38–126)
Anion gap: 6 (ref 5–15)
BUN: 24 mg/dL — ABNORMAL HIGH (ref 8–23)
CO2: 26 mmol/L (ref 22–32)
Calcium: 9.2 mg/dL (ref 8.9–10.3)
Chloride: 104 mmol/L (ref 98–111)
Creatinine: 0.95 mg/dL (ref 0.61–1.24)
GFR, Est AFR Am: >60 mL/min (ref >60)
GFR, Estimated: >60 mL/min (ref >60)
Glucose, Bld: 210 mg/dL — ABNORMAL HIGH (ref 70–99)
Potassium: 4.1 mmol/L (ref 3.5–5.1)
Sodium: 136 mmol/L (ref 135–145)
Total Bilirubin: 1 mg/dL (ref 0.3–1.2)
Total Protein: 5.8 g/dL — ABNORMAL LOW (ref 6.5–8.1)

## 2019-03-15 LAB — LACTATE DEHYDROGENASE: LDH: 225 U/L — ABNORMAL HIGH (ref 98–192)

## 2019-03-15 MED ORDER — HEPARIN SOD (PORK) LOCK FLUSH 100 UNIT/ML IV SOLN
500.0000 [IU] | Freq: Once | INTRAVENOUS | Status: AC | PRN
  Administered 2019-03-15: 500 [IU]
  Filled 2019-03-15: qty 5

## 2019-03-15 MED ORDER — SODIUM CHLORIDE 0.9% FLUSH
10.0000 mL | INTRAVENOUS | Status: DC | PRN
  Administered 2019-03-15: 10 mL
  Filled 2019-03-15: qty 10

## 2019-03-15 MED ORDER — ACETAMINOPHEN 325 MG PO TABS
650.0000 mg | ORAL_TABLET | Freq: Once | ORAL | Status: AC
  Administered 2019-03-15: 650 mg via ORAL

## 2019-03-15 MED ORDER — PROCHLORPERAZINE MALEATE 10 MG PO TABS
ORAL_TABLET | ORAL | Status: AC
  Filled 2019-03-15: qty 1

## 2019-03-15 MED ORDER — SODIUM CHLORIDE 0.9 % IV SOLN
20.0000 mg | Freq: Once | INTRAVENOUS | Status: AC
  Administered 2019-03-15: 11:00:00 20 mg via INTRAVENOUS
  Filled 2019-03-15: qty 20

## 2019-03-15 MED ORDER — SODIUM CHLORIDE 0.9 % IV SOLN
Freq: Once | INTRAVENOUS | Status: AC
  Administered 2019-03-15: 11:00:00 via INTRAVENOUS
  Filled 2019-03-15: qty 250

## 2019-03-15 MED ORDER — DIPHENHYDRAMINE HCL 25 MG PO CAPS
ORAL_CAPSULE | ORAL | Status: AC
  Filled 2019-03-15: qty 2

## 2019-03-15 MED ORDER — DIPHENHYDRAMINE HCL 25 MG PO CAPS
50.0000 mg | ORAL_CAPSULE | Freq: Once | ORAL | Status: AC
  Administered 2019-03-15: 11:00:00 50 mg via ORAL

## 2019-03-15 MED ORDER — ACETAMINOPHEN 325 MG PO TABS
ORAL_TABLET | ORAL | Status: AC
  Filled 2019-03-15: qty 2

## 2019-03-15 MED ORDER — PROCHLORPERAZINE MALEATE 10 MG PO TABS
10.0000 mg | ORAL_TABLET | Freq: Once | ORAL | Status: AC
  Administered 2019-03-15: 10 mg via ORAL

## 2019-03-15 MED ORDER — SODIUM CHLORIDE 0.9% FLUSH
10.0000 mL | Freq: Once | INTRAVENOUS | Status: AC
  Administered 2019-03-15: 10 mL
  Filled 2019-03-15: qty 10

## 2019-03-15 MED ORDER — DEXTROSE 5 % IV SOLN
71.0000 mg/m2 | Freq: Once | INTRAVENOUS | Status: AC
  Administered 2019-03-15: 150 mg via INTRAVENOUS
  Filled 2019-03-15: qty 60

## 2019-03-15 NOTE — Progress Notes (Signed)
Hematology and Oncology Follow Up Visit  Rickey [name] [id number] 04-Dec-1940 78 y.o. 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 He is doing well aside form a little fatigue.  M-spike- not detected in September. Kappa light chains were up slightly at 3.40 mg/dL.  He states that his blood glucose  Has been stable and most recent Hgb A1c was 4.8.  No fever, chills, n/v, cough, rash, dizziness, SOB, chest pain, palpitations, abdominal pain or changes in bowel or bladder habits.  No swelling, tenderness, numbness or tingling in his extremities.  No episodes of bleeding to report. He does bruise easily on the thin skin of his arms.  No falls or syncopal episodes to report.  He is eating well and staying hydrated. His weight is stable.   ECOG Performance Status: 1 - Symptomatic but completely ambulatory  Medications:  Allergies as of 03/15/2019   No Known Allergies     Medication List       Accurate as of March 15, 2019 10:32 AM. If you have any questions, ask your nurse or doctor.        acyclovir 400 MG tablet Commonly known as: ZOVIRAX TAKE 1 TABLET BY MOUTH TWICE DAILY   aspirin 81 MG tablet Take 81 mg by mouth daily.   atorvastatin 10 MG tablet Commonly known as: LIPITOR Take 10 mg by mouth daily.   calcium-vitamin D 250-125 MG-UNIT tablet Commonly known as: OSCAL Take 1 tablet by mouth daily.   cycloSPORINE 0.05 % ophthalmic emulsion Commonly known  as: RESTASIS Place 1 drop into both eyes 2 (two) times daily.   fish oil-omega-3 fatty acids 1000 MG capsule Take 1 g by mouth 2 (two) times daily.   INSULIN LISP & LISP PROT (HUM) Plover   insulin lispro 100 UNIT/ML injection Commonly known as: HUMALOG Inject into the skin. Insulin pump 12/21/2018 Typically 80U daily.   irbesartan-hydrochlorothiazide 300-12.5 MG tablet Commonly known as: AVALIDE Take 1 tablet by mouth daily.   lidocaine-prilocaine cream Commonly known as: EMLA Apply 1 application topically as needed.   montelukast 10 MG tablet Commonly known as: SINGULAIR Take 10 mg by mouth at bedtime.   Myrbetriq 50 MG Tb24 tablet Generic drug: mirabegron ER Take 50 mg by mouth daily.   ondansetron 4 MG disintegrating tablet Commonly known as: ZOFRAN-ODT Take 4 mg by mouth 4 (four) times daily as needed for nausea or vomiting.   pomalidomide 4 MG capsule Commonly known as: POMALYST Take 1 capsule (4 mg total) by mouth daily. Take with water on days 1-21. Repeat every 28 days. [id number]   Vitamin D 50 MCG (2000 UT) Caps Take by mouth every morning.       Allergies: No Known Allergies  Past Medical History, Surgical history, Social history, and Family History were reviewed and updated.  Review of Systems: All other 10 point review of systems is negative.   Physical Exam:  vitals were not taken for this visit.   Wt Readings from Last 3 Encounters:  02/15/19 202 lb (91.6 kg)  01/18/19 202 lb 4 oz (91.7 kg)  12/21/18 205 lb (93 kg)    Ocular: Sclerae unicteric, pupils equal, round and reactive to light Ear-nose-throat: Oropharynx clear, dentition fair Lymphatic: No cervical or supraclavicular adenopathy Lungs no rales or rhonchi, good excursion bilaterally Heart regular rate and rhythm, no murmur appreciated Abd soft, nontender, positive bowel sounds, no liver or spleen tip palpated on exam, no fluid wave  MSK no focal spinal tenderness, no joint edema  Neuro: non-focal, well-oriented, appropriate affect Breasts: Deferred   Lab Results  Component Value Date   WBC 4.2 03/15/2019   HGB 15.8 03/15/2019   HCT 46.1 03/15/2019   MCV 104.3 (H) 03/15/2019   PLT 153 03/15/2019   Lab Results  Component Value Date   FERRITIN 258 12/28/2018   IRON 98 12/28/2018   TIBC 253 12/28/2018   UIBC 154 12/28/2018   IRONPCTSAT 39 12/28/2018   Lab Results  Component Value Date   RBC 4.42 03/15/2019   Lab Results  Component Value Date   KPAFRELGTCHN 34.0 (H) 02/15/2019   LAMBDASER 2.0 (L) 02/15/2019   KAPLAMBRATIO 17.00 (H) 02/15/2019   Lab Results  Component Value Date   IGGSERUM 379 (L) 02/15/2019   IGA 6 (L) 02/15/2019   IGMSERUM 8 (L) 02/15/2019   Lab Results  Component Value Date   TOTALPROTELP 5.1 (L) 02/15/2019   ALBUMINELP 3.5 02/15/2019   A1GS 0.2 02/15/2019   A2GS 0.6 02/15/2019   BETS 0.6 (L) 02/15/2019   BETA2SER 0.3 04/19/2015   GAMS 0.2 (L) 02/15/2019   MSPIKE Not Observed 02/15/2019   SPEI Comment 11/23/2018     Chemistry      Component Value Date/Time   NA 138 03/01/2019 1100   NA 142 04/23/2017 1431   NA 139 07/18/2016 1135   K 4.2 03/01/2019 1100   K 4.0 04/23/2017 1431   K 4.4 07/18/2016 1135   CL 105 03/01/2019 1100   CL 105 04/23/2017 1431   CO2 26 03/01/2019 1100   CO2 28 04/23/2017 1431   CO2 27 07/18/2016 1135   BUN 21 03/01/2019 1100   BUN 20 04/23/2017 1431   BUN 12.2 07/18/2016 1135   CREATININE 1.01 03/01/2019 1100   CREATININE 1.2 04/23/2017 1431   CREATININE 0.9 07/18/2016 1135      Component Value Date/Time   CALCIUM 9.1 03/01/2019 1100   CALCIUM 9.3 04/23/2017 1431   CALCIUM 8.9 07/18/2016 1135   ALKPHOS 45 03/01/2019 1100   ALKPHOS 64 04/23/2017 1431   ALKPHOS 58 07/18/2016 1135   AST 24 03/01/2019 1100   AST 15 07/18/2016 1135   ALT 28 03/01/2019 1100   ALT 22 04/23/2017 1431   ALT 20 07/18/2016 1135   BILITOT 0.9 03/01/2019 1100   BILITOT 1.24 (H) 07/18/2016 1135        Impression and Plan: Rickey [name] is a very pleasant 78 yo caucasian gentleman with 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  We will proceed with Kyprolis treatment today as planned. For now, he will continue his weekly treatment schedule.  He opted to get IVIG next week on 10/26.  We will plan to see him back for follow-up in 1 month.  He will contact our office with any questions or concerns. We can certainly see him sooner if needed.   Laverna Peace, NP 10/19/202010:32 AM

## 2019-03-15 NOTE — Patient Instructions (Signed)

## 2019-03-16 LAB — PROTEIN ELECTROPHORESIS, SERUM, WITH REFLEX
A/G Ratio: 1.6 (ref 0.7–1.7)
Albumin ELP: 3.6 g/dL (ref 2.9–4.4)
Alpha-1-Globulin: 0.2 g/dL (ref 0.0–0.4)
Alpha-2-Globulin: 0.7 g/dL (ref 0.4–1.0)
Beta Globulin: 0.8 g/dL (ref 0.7–1.3)
Gamma Globulin: 0.6 g/dL (ref 0.4–1.8)
Globulin, Total: 2.2 g/dL (ref 2.2–3.9)
Total Protein ELP: 5.8 g/dL — ABNORMAL LOW (ref 6.0–8.5)

## 2019-03-16 LAB — KAPPA/LAMBDA LIGHT CHAINS
Kappa free light chain: 26.3 mg/L — ABNORMAL HIGH (ref 3.3–19.4)
Kappa, lambda light chain ratio: >17.53 — ABNORMAL HIGH (ref 0.26–1.65)
Lambda free light chains: <1.5 mg/L — ABNORMAL LOW (ref 5.7–26.3)

## 2019-03-17 ENCOUNTER — Encounter: Payer: Self-pay | Admitting: *Deleted

## 2019-03-17 LAB — IGG, IGA, IGM
IgA: 7 mg/dL — ABNORMAL LOW (ref 61–437)
IgG (Immunoglobin G), Serum: 705 mg/dL (ref 603–1613)
IgM (Immunoglobulin M), Srm: <5 mg/dL — ABNORMAL LOW (ref 15–143)

## 2019-03-22 ENCOUNTER — Other Ambulatory Visit: Payer: Self-pay | Admitting: Family

## 2019-03-22 ENCOUNTER — Inpatient Hospital Stay: Payer: Medicare Other

## 2019-03-22 ENCOUNTER — Other Ambulatory Visit: Payer: Self-pay

## 2019-03-22 VITALS — BP 125/72 | HR 66 | Resp 17

## 2019-03-22 DIAGNOSIS — D801 Nonfamilial hypogammaglobulinemia: Secondary | ICD-10-CM

## 2019-03-22 DIAGNOSIS — C9002 Multiple myeloma in relapse: Secondary | ICD-10-CM | POA: Diagnosis not present

## 2019-03-22 DIAGNOSIS — Z5112 Encounter for antineoplastic immunotherapy: Secondary | ICD-10-CM | POA: Diagnosis not present

## 2019-03-22 DIAGNOSIS — C9000 Multiple myeloma not having achieved remission: Secondary | ICD-10-CM

## 2019-03-22 LAB — CBC WITH DIFFERENTIAL (CANCER CENTER ONLY)
Abs Immature Granulocytes: 0.01 10*3/uL (ref 0.00–0.07)
Basophils Absolute: 0 10*3/uL (ref 0.0–0.1)
Basophils Relative: 0 %
Eosinophils Absolute: 0 10*3/uL (ref 0.0–0.5)
Eosinophils Relative: 1 %
HCT: 46.5 % (ref 39.0–52.0)
Hemoglobin: 15.9 g/dL (ref 13.0–17.0)
Immature Granulocytes: 0 %
Lymphocytes Relative: 26 %
Lymphs Abs: 1.3 10*3/uL (ref 0.7–4.0)
MCH: 35.5 pg — ABNORMAL HIGH (ref 26.0–34.0)
MCHC: 34.2 g/dL (ref 30.0–36.0)
MCV: 103.8 fL — ABNORMAL HIGH (ref 80.0–100.0)
Monocytes Absolute: 0.8 10*3/uL (ref 0.1–1.0)
Monocytes Relative: 16 %
Neutro Abs: 2.7 10*3/uL (ref 1.7–7.7)
Neutrophils Relative %: 57 %
Platelet Count: 85 10*3/uL — ABNORMAL LOW (ref 150–400)
RBC: 4.48 MIL/uL (ref 4.22–5.81)
RDW: 12.7 % (ref 11.5–15.5)
WBC Count: 4.8 10*3/uL (ref 4.0–10.5)
nRBC: 0 % (ref 0.0–0.2)

## 2019-03-22 LAB — CMP (CANCER CENTER ONLY)
ALT: 22 U/L (ref 0–44)
AST: 18 U/L (ref 15–41)
Albumin: 3.9 g/dL (ref 3.5–5.0)
Alkaline Phosphatase: 38 U/L (ref 38–126)
Anion gap: 7 (ref 5–15)
BUN: 21 mg/dL (ref 8–23)
CO2: 25 mmol/L (ref 22–32)
Calcium: 9 mg/dL (ref 8.9–10.3)
Chloride: 106 mmol/L (ref 98–111)
Creatinine: 1.04 mg/dL (ref 0.61–1.24)
GFR, Est AFR Am: >60 mL/min (ref >60)
GFR, Estimated: >60 mL/min (ref >60)
Glucose, Bld: 246 mg/dL — ABNORMAL HIGH (ref 70–99)
Potassium: 4.3 mmol/L (ref 3.5–5.1)
Sodium: 138 mmol/L (ref 135–145)
Total Bilirubin: 1 mg/dL (ref 0.3–1.2)
Total Protein: 5.7 g/dL — ABNORMAL LOW (ref 6.5–8.1)

## 2019-03-22 MED ORDER — SODIUM CHLORIDE 0.9% FLUSH
10.0000 mL | INTRAVENOUS | Status: DC | PRN
  Administered 2019-03-22: 10 mL
  Filled 2019-03-22: qty 10

## 2019-03-22 MED ORDER — SODIUM CHLORIDE 0.9 % IV SOLN
Freq: Once | INTRAVENOUS | Status: DC
  Filled 2019-03-22: qty 250

## 2019-03-22 MED ORDER — DIPHENHYDRAMINE HCL 25 MG PO CAPS
ORAL_CAPSULE | ORAL | Status: AC
  Filled 2019-03-22: qty 2

## 2019-03-22 MED ORDER — HEPARIN SOD (PORK) LOCK FLUSH 100 UNIT/ML IV SOLN
500.0000 [IU] | Freq: Once | INTRAVENOUS | Status: AC | PRN
  Administered 2019-03-22: 500 [IU]
  Filled 2019-03-22: qty 5

## 2019-03-22 MED ORDER — IMMUNE GLOBULIN (HUMAN) 20 GM/200ML IV SOLN
40.0000 g | Freq: Once | INTRAVENOUS | Status: AC
  Administered 2019-03-22: 40 g via INTRAVENOUS
  Filled 2019-03-22: qty 400

## 2019-03-22 MED ORDER — DEXTROSE 5 % IV SOLN
71.0000 mg/m2 | Freq: Once | INTRAVENOUS | Status: AC
  Administered 2019-03-22: 150 mg via INTRAVENOUS
  Filled 2019-03-22: qty 60

## 2019-03-22 MED ORDER — PROCHLORPERAZINE MALEATE 10 MG PO TABS
ORAL_TABLET | ORAL | Status: AC
  Filled 2019-03-22: qty 1

## 2019-03-22 MED ORDER — DIPHENHYDRAMINE HCL 25 MG PO CAPS
50.0000 mg | ORAL_CAPSULE | Freq: Once | ORAL | Status: AC
  Administered 2019-03-22: 50 mg via ORAL

## 2019-03-22 MED ORDER — DEXTROSE 5 % IV SOLN
INTRAVENOUS | Status: DC
  Administered 2019-03-22: 11:00:00 via INTRAVENOUS
  Filled 2019-03-22 (×2): qty 250

## 2019-03-22 MED ORDER — ACETAMINOPHEN 325 MG PO TABS
650.0000 mg | ORAL_TABLET | Freq: Once | ORAL | Status: AC
  Administered 2019-03-22: 650 mg via ORAL

## 2019-03-22 MED ORDER — PROCHLORPERAZINE MALEATE 10 MG PO TABS
10.0000 mg | ORAL_TABLET | Freq: Once | ORAL | Status: AC
  Administered 2019-03-22: 10 mg via ORAL

## 2019-03-22 MED ORDER — ACETAMINOPHEN 325 MG PO TABS
ORAL_TABLET | ORAL | Status: AC
  Filled 2019-03-22: qty 2

## 2019-03-22 MED ORDER — SODIUM CHLORIDE 0.9 % IV SOLN
20.0000 mg | Freq: Once | INTRAVENOUS | Status: AC
  Administered 2019-03-22: 20 mg via INTRAVENOUS
  Filled 2019-03-22: qty 20

## 2019-03-22 NOTE — Progress Notes (Signed)
OK to treat platelets of 85 per Dr Marin Olp. dph

## 2019-03-22 NOTE — Patient Instructions (Signed)

## 2019-03-22 NOTE — Patient Instructions (Signed)
Carfilzomib injection What is this medicine? CARFILZOMIB (kar FILZ oh mib) targets a specific protein within cancer cells and stops the cancer cells from growing. It is used to treat multiple myeloma. This medicine may be used for other purposes; ask your health care provider or pharmacist if you have questions. COMMON BRAND NAME(S): KYPROLIS What should I tell my health care provider before I take this medicine? They need to know if you have any of these conditions:  heart disease  history of blood clots  irregular heartbeat  kidney disease  liver disease  lung or breathing disease  an unusual or allergic reaction to carfilzomib, or other medicines, foods, dyes, or preservatives  pregnant or trying to get pregnant  breast-feeding How should I use this medicine? This medicine is for injection or infusion into a vein. It is given by a health care professional in a hospital or clinic setting. Talk to your pediatrician regarding the use of this medicine in children. Special care may be needed. Overdosage: If you think you have taken too much of this medicine contact a poison control center or emergency room at once. NOTE: This medicine is only for you. Do not share this medicine with others. What if I miss a dose? It is important not to miss your dose. Call your doctor or health care professional if you are unable to keep an appointment. What may interact with this medicine? Interactions are not expected. Give your health care provider a list of all the medicines, herbs, non-prescription drugs, or dietary supplements you use. Also tell them if you smoke, drink alcohol, or use illegal drugs. Some items may interact with your medicine. This list may not describe all possible interactions. Give your health care provider a list of all the medicines, herbs, non-prescription drugs, or dietary supplements you use. Also tell them if you smoke, drink alcohol, or use illegal drugs. Some items  may interact with your medicine. What should I watch for while using this medicine? Your condition will be monitored carefully while you are receiving this medicine. Report any side effects. Continue your course of treatment even though you feel ill unless your doctor tells you to stop. You may need blood work done while you are taking this medicine. Do not become pregnant while taking this medicine or for at least 6 months after stopping it. Women should inform their doctor if they wish to become pregnant or think they might be pregnant. There is a potential for serious side effects to an unborn child. Men should not father a child while taking this medicine and for at least 3 months after stopping it. Talk to your health care professional or pharmacist for more information. Do not breast-feed an infant while taking this medicine or for 2 weeks after the last dose. Check with your doctor or health care professional if you get an attack of severe diarrhea, nausea and vomiting, or if you sweat a lot. The loss of too much body fluid can make it dangerous for you to take this medicine. You may get dizzy. Do not drive, use machinery, or do anything that needs mental alertness until you know how this medicine affects you. Do not stand or sit up quickly, especially if you are an older patient. This reduces the risk of dizzy or fainting spells. What side effects may I notice from receiving this medicine? Side effects that you should report to your doctor or health care professional as soon as possible:  allergic reactions like skin  rash, itching or hives, swelling of the face, lips, or tongue  confusion  dizziness  feeling faint or lightheaded  fever or chills  palpitations  seizures  signs and symptoms of bleeding such as bloody or black, tarry stools; red or dark-brown urine; spitting up blood or brown material that looks like coffee grounds; red spots on the skin; unusual bruising or bleeding  including from the eye, gums, or nose  signs and symptoms of a blood clot such as breathing problems; changes in vision; chest pain; severe, sudden headache; pain, swelling, warmth in the leg; trouble speaking; sudden numbness or weakness of the face, arm or leg  signs and symptoms of kidney injury like trouble passing urine or change in the amount of urine  signs and symptoms of liver injury like dark yellow or brown urine; general ill feeling or flu-like symptoms; light-colored stools; loss of appetite; nausea; right upper belly pain; unusually weak or tired; yellowing of the eyes or skin Side effects that usually do not require medical attention (report to your doctor or health care professional if they continue or are bothersome):  back pain  cough  diarrhea  headache  muscle cramps  vomiting This list may not describe all possible side effects. Call your doctor for medical advice about side effects. You may report side effects to FDA at 1-800-FDA-1088. Where should I keep my medicine? This drug is given in a hospital or clinic and will not be stored at home. NOTE: This sheet is a summary. It may not cover all possible information. If you have questions about this medicine, talk to your doctor, pharmacist, or health care provider.  2020 Elsevier/Gold Standard (2017-02-26 14:07:13) Immune Globulin Injection What is this medicine? IMMUNE GLOBULIN (im MUNE GLOB yoo lin) helps to prevent or reduce the severity of certain infections in patients who are at risk. This medicine is collected from the pooled blood of many donors. It is used to treat immune system problems, thrombocytopenia, and Kawasaki syndrome. This medicine may be used for other purposes; ask your health care provider or pharmacist if you have questions. COMMON BRAND NAME(S): ASCENIV, Baygam, BIVIGAM, Carimune, Carimune NF, cutaquig, Cuvitru, Flebogamma, Flebogamma DIF, GamaSTAN, GamaSTAN S/D, Gamimune N, Gammagard,  Gammagard S/D, Gammaked, Gammaplex, Gammar-P IV, Gamunex, Gamunex-C, Hizentra, Iveegam, Iveegam EN, Octagam, Panglobulin, Panglobulin NF, panzyga, Polygam S/D, Privigen, Sandoglobulin, Venoglobulin-S, Vigam, Vivaglobulin, Xembify What should I tell my health care provider before I take this medicine? They need to know if you have any of these conditions:   diabetes   extremely low or no immune antibodies in the blood   heart disease   history of blood clots   hyperprolinemia   infection in the blood, sepsis   kidney disease   taking medicine that may change kidney function - ask your health care provider about your medicine   an unusual or allergic reaction to human immune globulin, albumin, maltose, sucrose, polysorbate 80, other medicines, foods, dyes, or preservatives   pregnant or trying to get pregnant   breast-feeding How should I use this medicine? This medicine is for injection into a muscle or infusion into a vein or skin. It is usually given by a health care professional in a hospital or clinic setting. In rare cases, some brands of this medicine might be given at home. You will be taught how to give this medicine. Use exactly as directed. Take your medicine at regular intervals. Do not take your medicine more often than directed. Talk to your   pediatrician regarding the use of this medicine in children. Special care may be needed. Overdosage: If you think you have taken too much of this medicine contact a poison control center or emergency room at once. NOTE: This medicine is only for you. Do not share this medicine with others. What if I miss a dose? It is important not to miss your dose. Call your doctor or health care professional if you are unable to keep an appointment. If you give yourself the medicine and you miss a dose, take it as soon as you can. If it is almost time for your next dose, take only that dose. Do not take double or extra doses. What may  interact with this medicine?  aspirin and aspirin-like medicines  cisplatin  cyclosporine  medicines for infection like acyclovir, adefovir, amphotericin B, bacitracin, cidofovir, foscarnet, ganciclovir, gentamicin, pentamidine, vancomycin  NSAIDS, medicines for pain and inflammation, like ibuprofen or naproxen  pamidronate  vaccines  zoledronic acid This list may not describe all possible interactions. Give your health care provider a list of all the medicines, herbs, non-prescription drugs, or dietary supplements you use. Also tell them if you smoke, drink alcohol, or use illegal drugs. Some items may interact with your medicine. What should I watch for while using this medicine? Your condition will be monitored carefully while you are receiving this medicine. This medicine is made from pooled blood donations of many different people. It may be possible to pass an infection in this medicine. However, the donors are screened for infections and all products are tested for HIV and hepatitis. The medicine is treated to kill most or all bacteria and viruses. Talk to your doctor about the risks and benefits of this medicine. Do not have vaccinations for at least 14 days before, or until at least 3 months after receiving this medicine. What side effects may I notice from receiving this medicine? Side effects that you should report to your doctor or health care professional as soon as possible:  allergic reactions like skin rash, itching or hives, swelling of the face, lips, or tongue  breathing problems  chest pain or tightness  fever, chills  headache with nausea, vomiting  neck pain or difficulty moving neck  pain when moving eyes  pain, swelling, warmth in the leg  problems with balance, talking, walking  sudden weight gain  swelling of the ankles, feet, hands  trouble passing urine or change in the amount of urine Side effects that usually do not require medical  attention (report to your doctor or health care professional if they continue or are bothersome):  dizzy, drowsy  flushing  increased sweating  leg cramps  muscle aches and pains  pain at site where injected This list may not describe all possible side effects. Call your doctor for medical advice about side effects. You may report side effects to FDA at 1-800-FDA-1088. Where should I keep my medicine? Keep out of the reach of children. This drug is usually given in a hospital or clinic and will not be stored at home. In rare cases, some brands of this medicine may be given at home. If you are using this medicine at home, you will be instructed on how to store this medicine. Throw away any unused medicine after the expiration date on the label. NOTE: This sheet is a summary. It may not cover all possible information. If you have questions about this medicine, talk to your doctor, pharmacist, or health care provider.  2020   Elsevier/Gold Standard (2008-08-03 11:44:49)  

## 2019-03-22 NOTE — Progress Notes (Signed)
OK to treat with platelets of 85 per order of Dr. Marin Olp.

## 2019-03-29 ENCOUNTER — Other Ambulatory Visit: Payer: Self-pay

## 2019-03-29 ENCOUNTER — Telehealth: Payer: Self-pay | Admitting: *Deleted

## 2019-03-29 ENCOUNTER — Inpatient Hospital Stay: Payer: Medicare Other

## 2019-03-29 ENCOUNTER — Inpatient Hospital Stay: Payer: Medicare Other | Attending: Hematology & Oncology

## 2019-03-29 DIAGNOSIS — C9000 Multiple myeloma not having achieved remission: Secondary | ICD-10-CM

## 2019-03-29 DIAGNOSIS — E119 Type 2 diabetes mellitus without complications: Secondary | ICD-10-CM | POA: Diagnosis not present

## 2019-03-29 DIAGNOSIS — C9002 Multiple myeloma in relapse: Secondary | ICD-10-CM | POA: Insufficient documentation

## 2019-03-29 DIAGNOSIS — Z5112 Encounter for antineoplastic immunotherapy: Secondary | ICD-10-CM | POA: Insufficient documentation

## 2019-03-29 DIAGNOSIS — D801 Nonfamilial hypogammaglobulinemia: Secondary | ICD-10-CM

## 2019-03-29 LAB — CBC WITH DIFFERENTIAL (CANCER CENTER ONLY)
Abs Immature Granulocytes: 0.01 10*3/uL (ref 0.00–0.07)
Basophils Absolute: 0 10*3/uL (ref 0.0–0.1)
Basophils Relative: 1 %
Eosinophils Absolute: 0.1 10*3/uL (ref 0.0–0.5)
Eosinophils Relative: 2 %
HCT: 44.5 % (ref 39.0–52.0)
Hemoglobin: 15.1 g/dL (ref 13.0–17.0)
Immature Granulocytes: 0 %
Lymphocytes Relative: 29 %
Lymphs Abs: 1 10*3/uL (ref 0.7–4.0)
MCH: 35.3 pg — ABNORMAL HIGH (ref 26.0–34.0)
MCHC: 33.9 g/dL (ref 30.0–36.0)
MCV: 104 fL — ABNORMAL HIGH (ref 80.0–100.0)
Monocytes Absolute: 0.7 10*3/uL (ref 0.1–1.0)
Monocytes Relative: 19 %
Neutro Abs: 1.7 10*3/uL (ref 1.7–7.7)
Neutrophils Relative %: 49 %
Platelet Count: 91 10*3/uL — ABNORMAL LOW (ref 150–400)
RBC: 4.28 MIL/uL (ref 4.22–5.81)
RDW: 12.7 % (ref 11.5–15.5)
WBC Count: 3.4 10*3/uL — ABNORMAL LOW (ref 4.0–10.5)
nRBC: 0 % (ref 0.0–0.2)

## 2019-03-29 LAB — CMP (CANCER CENTER ONLY)
ALT: 25 U/L (ref 0–44)
AST: 22 U/L (ref 15–41)
Albumin: 3.8 g/dL (ref 3.5–5.0)
Alkaline Phosphatase: 40 U/L (ref 38–126)
Anion gap: 6 (ref 5–15)
BUN: 21 mg/dL (ref 8–23)
CO2: 26 mmol/L (ref 22–32)
Calcium: 9.2 mg/dL (ref 8.9–10.3)
Chloride: 108 mmol/L (ref 98–111)
Creatinine: 0.94 mg/dL (ref 0.61–1.24)
GFR, Est AFR Am: >60 mL/min (ref >60)
GFR, Estimated: >60 mL/min (ref >60)
Glucose, Bld: 93 mg/dL (ref 70–99)
Potassium: 4.1 mmol/L (ref 3.5–5.1)
Sodium: 140 mmol/L (ref 135–145)
Total Bilirubin: 1 mg/dL (ref 0.3–1.2)
Total Protein: 6.1 g/dL — ABNORMAL LOW (ref 6.5–8.1)

## 2019-03-29 MED ORDER — DIPHENHYDRAMINE HCL 25 MG PO CAPS
50.0000 mg | ORAL_CAPSULE | Freq: Once | ORAL | Status: AC
  Administered 2019-03-29: 50 mg via ORAL

## 2019-03-29 MED ORDER — PROCHLORPERAZINE MALEATE 10 MG PO TABS
ORAL_TABLET | ORAL | Status: AC
  Filled 2019-03-29: qty 1

## 2019-03-29 MED ORDER — SODIUM CHLORIDE 0.9 % IV SOLN
20.0000 mg | Freq: Once | INTRAVENOUS | Status: AC
  Administered 2019-03-29: 20 mg via INTRAVENOUS
  Filled 2019-03-29: qty 2

## 2019-03-29 MED ORDER — DEXTROSE 5 % IV SOLN
71.0000 mg/m2 | Freq: Once | INTRAVENOUS | Status: AC
  Administered 2019-03-29: 150 mg via INTRAVENOUS
  Filled 2019-03-29: qty 60

## 2019-03-29 MED ORDER — DIPHENHYDRAMINE HCL 25 MG PO CAPS
ORAL_CAPSULE | ORAL | Status: AC
  Filled 2019-03-29: qty 2

## 2019-03-29 MED ORDER — PROCHLORPERAZINE MALEATE 10 MG PO TABS
10.0000 mg | ORAL_TABLET | Freq: Once | ORAL | Status: AC
  Administered 2019-03-29: 10 mg via ORAL

## 2019-03-29 MED ORDER — HEPARIN SOD (PORK) LOCK FLUSH 100 UNIT/ML IV SOLN
500.0000 [IU] | Freq: Once | INTRAVENOUS | Status: AC | PRN
  Administered 2019-03-29: 500 [IU]
  Filled 2019-03-29: qty 5

## 2019-03-29 MED ORDER — SODIUM CHLORIDE 0.9 % IV SOLN
Freq: Once | INTRAVENOUS | Status: AC
  Administered 2019-03-29: 12:00:00 via INTRAVENOUS
  Filled 2019-03-29: qty 250

## 2019-03-29 MED ORDER — SODIUM CHLORIDE 0.9 % IV SOLN
Freq: Once | INTRAVENOUS | Status: AC
  Administered 2019-03-29: 11:00:00 via INTRAVENOUS
  Filled 2019-03-29: qty 250

## 2019-03-29 MED ORDER — ACETAMINOPHEN 325 MG PO TABS
ORAL_TABLET | ORAL | Status: AC
  Filled 2019-03-29: qty 2

## 2019-03-29 MED ORDER — SODIUM CHLORIDE 0.9% FLUSH
10.0000 mL | INTRAVENOUS | Status: DC | PRN
  Administered 2019-03-29: 10 mL
  Filled 2019-03-29: qty 10

## 2019-03-29 MED ORDER — ACETAMINOPHEN 325 MG PO TABS
650.0000 mg | ORAL_TABLET | Freq: Once | ORAL | Status: AC
  Administered 2019-03-29: 650 mg via ORAL

## 2019-03-29 NOTE — Patient Instructions (Signed)
[facility] Discharge Instructions for Patients Receiving Chemotherapy  Today you received the following chemotherapy agents Kyprolis  To help prevent nausea and vomiting after your treatment, we encourage you to take your nausea medication as prescribed by MD.   If you develop nausea and vomiting that is not controlled by your nausea medication, call the clinic.   BELOW ARE SYMPTOMS THAT SHOULD BE REPORTED IMMEDIATELY:  *FEVER GREATER THAN 100.5 F  *CHILLS WITH OR WITHOUT FEVER  NAUSEA AND VOMITING THAT IS NOT CONTROLLED WITH YOUR NAUSEA MEDICATION  *UNUSUAL SHORTNESS OF BREATH  *UNUSUAL BRUISING OR BLEEDING  TENDERNESS IN MOUTH AND THROAT WITH OR WITHOUT PRESENCE OF ULCERS  *URINARY PROBLEMS  *BOWEL PROBLEMS  UNUSUAL RASH Items with * indicate a potential emergency and should be followed up as soon as possible.  Feel free to call the clinic should you have any questions or concerns. The clinic phone number is [phone].  Please show the CHEMO ALERT CARD at check-in to the Emergency Department and triage nurse.   

## 2019-03-29 NOTE — Patient Instructions (Signed)

## 2019-03-29 NOTE — Progress Notes (Signed)
OK to treat with platelet count of 91 per order of Dr. Marin Olp.

## 2019-03-31 DIAGNOSIS — Z85828 Personal history of other malignant neoplasm of skin: Secondary | ICD-10-CM | POA: Diagnosis not present

## 2019-03-31 DIAGNOSIS — D492 Neoplasm of unspecified behavior of bone, soft tissue, and skin: Secondary | ICD-10-CM | POA: Diagnosis not present

## 2019-03-31 DIAGNOSIS — C44319 Basal cell carcinoma of skin of other parts of face: Secondary | ICD-10-CM | POA: Diagnosis not present

## 2019-03-31 DIAGNOSIS — C44311 Basal cell carcinoma of skin of nose: Secondary | ICD-10-CM | POA: Diagnosis not present

## 2019-03-31 DIAGNOSIS — L821 Other seborrheic keratosis: Secondary | ICD-10-CM | POA: Diagnosis not present

## 2019-03-31 DIAGNOSIS — D1801 Hemangioma of skin and subcutaneous tissue: Secondary | ICD-10-CM | POA: Diagnosis not present

## 2019-04-05 ENCOUNTER — Inpatient Hospital Stay: Payer: Medicare Other

## 2019-04-12 ENCOUNTER — Inpatient Hospital Stay: Payer: Medicare Other

## 2019-04-12 ENCOUNTER — Other Ambulatory Visit: Payer: Self-pay

## 2019-04-12 ENCOUNTER — Inpatient Hospital Stay (HOSPITAL_BASED_OUTPATIENT_CLINIC_OR_DEPARTMENT_OTHER): Payer: Medicare Other | Admitting: Hematology & Oncology

## 2019-04-12 ENCOUNTER — Encounter: Payer: Self-pay | Admitting: Hematology & Oncology

## 2019-04-12 VITALS — BP 128/60 | HR 75 | Temp 97.1°F | Resp 18 | Wt 204.0 lb

## 2019-04-12 DIAGNOSIS — C9000 Multiple myeloma not having achieved remission: Secondary | ICD-10-CM

## 2019-04-12 DIAGNOSIS — D801 Nonfamilial hypogammaglobulinemia: Secondary | ICD-10-CM

## 2019-04-12 DIAGNOSIS — Z5112 Encounter for antineoplastic immunotherapy: Secondary | ICD-10-CM | POA: Diagnosis not present

## 2019-04-12 DIAGNOSIS — E119 Type 2 diabetes mellitus without complications: Secondary | ICD-10-CM | POA: Diagnosis not present

## 2019-04-12 DIAGNOSIS — C9002 Multiple myeloma in relapse: Secondary | ICD-10-CM | POA: Diagnosis not present

## 2019-04-12 LAB — CMP (CANCER CENTER ONLY)
ALT: 33 U/L (ref 0–44)
AST: 27 U/L (ref 15–41)
Albumin: 3.9 g/dL (ref 3.5–5.0)
Alkaline Phosphatase: 42 U/L (ref 38–126)
Anion gap: 8 (ref 5–15)
BUN: 21 mg/dL (ref 8–23)
CO2: 24 mmol/L (ref 22–32)
Calcium: 9.1 mg/dL (ref 8.9–10.3)
Chloride: 108 mmol/L (ref 98–111)
Creatinine: 1.03 mg/dL (ref 0.61–1.24)
GFR, Est AFR Am: >60 mL/min (ref >60)
GFR, Estimated: >60 mL/min (ref >60)
Glucose, Bld: 184 mg/dL — ABNORMAL HIGH (ref 70–99)
Potassium: 4.1 mmol/L (ref 3.5–5.1)
Sodium: 140 mmol/L (ref 135–145)
Total Bilirubin: 0.9 mg/dL (ref 0.3–1.2)
Total Protein: 5.7 g/dL — ABNORMAL LOW (ref 6.5–8.1)

## 2019-04-12 LAB — CBC WITH DIFFERENTIAL (CANCER CENTER ONLY)
Abs Immature Granulocytes: 0.01 10*3/uL (ref 0.00–0.07)
Basophils Absolute: 0.1 10*3/uL (ref 0.0–0.1)
Basophils Relative: 1 %
Eosinophils Absolute: 0.1 10*3/uL (ref 0.0–0.5)
Eosinophils Relative: 1 %
HCT: 45.3 % (ref 39.0–52.0)
Hemoglobin: 15.3 g/dL (ref 13.0–17.0)
Immature Granulocytes: 0 %
Lymphocytes Relative: 25 %
Lymphs Abs: 1.1 10*3/uL (ref 0.7–4.0)
MCH: 35.4 pg — ABNORMAL HIGH (ref 26.0–34.0)
MCHC: 33.8 g/dL (ref 30.0–36.0)
MCV: 104.9 fL — ABNORMAL HIGH (ref 80.0–100.0)
Monocytes Absolute: 0.6 10*3/uL (ref 0.1–1.0)
Monocytes Relative: 14 %
Neutro Abs: 2.6 10*3/uL (ref 1.7–7.7)
Neutrophils Relative %: 59 %
Platelet Count: 131 10*3/uL — ABNORMAL LOW (ref 150–400)
RBC: 4.32 MIL/uL (ref 4.22–5.81)
RDW: 13 % (ref 11.5–15.5)
WBC Count: 4.4 10*3/uL (ref 4.0–10.5)
nRBC: 0 % (ref 0.0–0.2)

## 2019-04-12 LAB — LACTATE DEHYDROGENASE: LDH: 230 U/L — ABNORMAL HIGH (ref 98–192)

## 2019-04-12 MED ORDER — DEXTROSE 5 % IV SOLN
71.0000 mg/m2 | Freq: Once | INTRAVENOUS | Status: AC
  Administered 2019-04-12: 150 mg via INTRAVENOUS
  Filled 2019-04-12: qty 60

## 2019-04-12 MED ORDER — ACETAMINOPHEN 325 MG PO TABS
650.0000 mg | ORAL_TABLET | Freq: Once | ORAL | Status: AC
  Administered 2019-04-12: 650 mg via ORAL

## 2019-04-12 MED ORDER — SODIUM CHLORIDE 0.9 % IV SOLN
20.0000 mg | Freq: Once | INTRAVENOUS | Status: AC
  Administered 2019-04-12: 20 mg via INTRAVENOUS
  Filled 2019-04-12: qty 2

## 2019-04-12 MED ORDER — DIPHENHYDRAMINE HCL 25 MG PO CAPS
50.0000 mg | ORAL_CAPSULE | Freq: Once | ORAL | Status: AC
  Administered 2019-04-12: 50 mg via ORAL

## 2019-04-12 MED ORDER — PROCHLORPERAZINE MALEATE 10 MG PO TABS
10.0000 mg | ORAL_TABLET | Freq: Once | ORAL | Status: AC
  Administered 2019-04-12: 10 mg via ORAL

## 2019-04-12 MED ORDER — PROCHLORPERAZINE MALEATE 10 MG PO TABS
ORAL_TABLET | ORAL | Status: AC
  Filled 2019-04-12: qty 1

## 2019-04-12 MED ORDER — SODIUM CHLORIDE 0.9% FLUSH
10.0000 mL | Freq: Once | INTRAVENOUS | Status: AC
  Administered 2019-04-12: 10 mL
  Filled 2019-04-12: qty 10

## 2019-04-12 MED ORDER — SODIUM CHLORIDE 0.9% FLUSH
10.0000 mL | INTRAVENOUS | Status: DC | PRN
  Filled 2019-04-12: qty 10

## 2019-04-12 MED ORDER — SODIUM CHLORIDE 0.9 % IV SOLN
Freq: Once | INTRAVENOUS | Status: DC
  Filled 2019-04-12: qty 250

## 2019-04-12 MED ORDER — ACETAMINOPHEN 325 MG PO TABS
ORAL_TABLET | ORAL | Status: AC
  Filled 2019-04-12: qty 2

## 2019-04-12 MED ORDER — SODIUM CHLORIDE 0.9 % IV SOLN
Freq: Once | INTRAVENOUS | Status: AC
  Administered 2019-04-12: 12:00:00 via INTRAVENOUS
  Filled 2019-04-12: qty 250

## 2019-04-12 MED ORDER — HEPARIN SOD (PORK) LOCK FLUSH 100 UNIT/ML IV SOLN
500.0000 [IU] | Freq: Once | INTRAVENOUS | Status: DC | PRN
  Filled 2019-04-12: qty 5

## 2019-04-12 MED ORDER — DIPHENHYDRAMINE HCL 25 MG PO CAPS
ORAL_CAPSULE | ORAL | Status: AC
  Filled 2019-04-12: qty 2

## 2019-04-12 NOTE — Patient Instructions (Signed)
[facility] Discharge Instructions for Patients Receiving Chemotherapy  Today you received the following chemotherapy agents Kyprolis  To help prevent nausea and vomiting after your treatment, we encourage you to take your nausea medication as prescribed by MD.   If you develop nausea and vomiting that is not controlled by your nausea medication, call the clinic.   BELOW ARE SYMPTOMS THAT SHOULD BE REPORTED IMMEDIATELY:  *FEVER GREATER THAN 100.5 F  *CHILLS WITH OR WITHOUT FEVER  NAUSEA AND VOMITING THAT IS NOT CONTROLLED WITH YOUR NAUSEA MEDICATION  *UNUSUAL SHORTNESS OF BREATH  *UNUSUAL BRUISING OR BLEEDING  TENDERNESS IN MOUTH AND THROAT WITH OR WITHOUT PRESENCE OF ULCERS  *URINARY PROBLEMS  *BOWEL PROBLEMS  UNUSUAL RASH Items with * indicate a potential emergency and should be followed up as soon as possible.  Feel free to call the clinic should you have any questions or concerns. The clinic phone number is [phone].  Please show the CHEMO ALERT CARD at check-in to the Emergency Department and triage nurse.   

## 2019-04-12 NOTE — Patient Instructions (Signed)

## 2019-04-12 NOTE — Progress Notes (Signed)
Hematology and Oncology Follow Up Visit  Rickey [name] [id number] [birth year] 78 y.o. 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 Elotuzumab/Pomalidomide/decadron -every 4 weeks- start 01/12/2018 Kyprolis/Pomalidomide/Decadron -- s/p cycle #7 -- d/c pomaldomide in 01/2019 IVIG 40 mg IV q month   Interim History:  Rickey [name] is here today for follow-up.  So far, everything is going quite well.  [place]  It will get even colder this week for he and his wife.  As always, they will be moving to their beach home in December.  They plan on leaving December 2.  They will be back in May.  Again treated at [facility].  They have a very good oncologist there who really does a fantastic job.  He is done incredibly well with the Kyprolis by itself.  His last kappa light chain in October was 26.3.  This is down from 34 mg/L.  I think that if he does begin to show progression, I would probably add Selinexor to the Kyprolis.  His blood sugars have been doing pretty well.  He is very diligent in monitoring these.  There has been no issues with nausea or vomiting.  He has had no change in bowel or bladder habits.  He has had no cough.  He is getting IVIG monthly.  Back in October, his IgG level was 705 mg/dL.  Overall, I would say his performance status is ECOG 1.       Medications:  Allergies as of 04/12/2019   No Known Allergies     Medication List       Accurate as of April 12, 2019 12:11 PM. If you have any questions, ask your nurse or doctor.        STOP taking these medications   pomalidomide 4 MG capsule Commonly known as: POMALYST Stopped by: [name], MD     TAKE these medications   acyclovir 400 MG tablet Commonly known as: ZOVIRAX TAKE 1 TABLET BY MOUTH TWICE DAILY   aspirin 81 MG tablet Take 81 mg by mouth daily.   atorvastatin 10 MG tablet Commonly known as: LIPITOR Take 10 mg by mouth daily.   calcium-vitamin D 250-125 MG-UNIT tablet Commonly known as: OSCAL Take 1 tablet by mouth daily.   cycloSPORINE 0.05 % ophthalmic emulsion Commonly known as: RESTASIS Place 1 drop into both eyes 2 (two) times daily.   fish oil-omega-3 fatty acids 1000 MG capsule Take 1 g by mouth 2 (two) times daily.   INSULIN LISP & LISP PROT (HUM) [place]   insulin lispro 100 UNIT/ML injection Commonly known as: HUMALOG Inject into the skin. Insulin pump 12/21/2018 Typically 80U daily.   irbesartan-hydrochlorothiazide 300-12.5 MG tablet Commonly known as: AVALIDE Take 1 tablet by mouth daily.   lidocaine-prilocaine cream Commonly known as: EMLA Apply 1 application topically as needed.   montelukast 10 MG tablet Commonly known as: SINGULAIR Take 10 mg by mouth at bedtime.   Myrbetriq 50 MG Tb24 tablet Generic drug: mirabegron ER Take 50 mg by mouth daily.   ondansetron 4 MG disintegrating tablet Commonly known as: ZOFRAN-ODT Take 4 mg by mouth 4 (four) times daily as  needed for nausea or vomiting.   Vitamin D 50 MCG (2000 UT) Caps Take by mouth every morning.       Allergies: No Known Allergies  Past Medical History, Surgical history, Social history, and Family History were reviewed and updated.  Review of Systems: Review of Systems  Constitutional: Negative.   HENT: Negative.   Eyes: Negative.   Respiratory: Negative.   Cardiovascular: Negative.   Gastrointestinal: Negative.   Genitourinary: Negative.   Musculoskeletal: Negative.   Skin: Negative.   Neurological: Negative.   Endo/Heme/Allergies: Negative.   Psychiatric/Behavioral: Negative.      Physical Exam:  weight is 204 lb (92.5 kg). His temporal temperature is  97.1 F (36.2 C) (abnormal). His blood pressure is 128/60 and his pulse is 75. His respiration is 18 and oxygen saturation is 100%.   Wt Readings from Last 3 Encounters:  04/12/19 204 lb (92.5 kg)  02/15/19 202 lb (91.6 kg)  01/18/19 202 lb 4 oz (91.7 kg)    Physical Exam Vitals signs reviewed.  HENT:     Head: Normocephalic and atraumatic.  Eyes:     Pupils: Pupils are equal, round, and reactive to light.  Neck:     Musculoskeletal: Normal range of motion.  Cardiovascular:     Rate and Rhythm: Normal rate and regular rhythm.     Heart sounds: Normal heart sounds.  Pulmonary:     Effort: Pulmonary effort is normal.     Breath sounds: Normal breath sounds.  Abdominal:     General: Bowel sounds are normal.     Palpations: Abdomen is soft.  Musculoskeletal: Normal range of motion.        General: No tenderness or deformity.  Lymphadenopathy:     Cervical: No cervical adenopathy.  Skin:    General: Skin is warm and dry.     Findings: No erythema or rash.  Neurological:     Mental Status: He is alert and oriented to person, place, and time.  Psychiatric:        Behavior: Behavior normal.        Thought Content: Thought content normal.        Judgment: Judgment normal.      Lab Results  Component Value Date   WBC 4.4 04/12/2019   HGB 15.3 04/12/2019   HCT 45.3 04/12/2019   MCV 104.9 (H) 04/12/2019   PLT 131 (L) 04/12/2019   Lab Results  Component Value Date   FERRITIN 258 12/28/2018   IRON 98 12/28/2018   TIBC 253 12/28/2018   UIBC 154 12/28/2018   IRONPCTSAT 39 12/28/2018   Lab Results  Component Value Date   RBC 4.32 04/12/2019   Lab Results  Component Value Date   KPAFRELGTCHN 26.3 (H) 03/15/2019   LAMBDASER <1.5 (L) 03/15/2019   KAPLAMBRATIO >17.53 (H) 03/15/2019   Lab Results  Component Value Date   IGGSERUM 705 03/15/2019   IGA 7 (L) 03/15/2019   IGMSERUM <5 (L) 03/15/2019   Lab Results  Component Value Date   TOTALPROTELP 5.8 (L)  03/15/2019   ALBUMINELP 3.6 03/15/2019   A1GS 0.2 03/15/2019   A2GS 0.7 03/15/2019   BETS 0.8 03/15/2019   BETA2SER 0.3 04/19/2015   GAMS 0.6 03/15/2019   MSPIKE Not Observed 03/15/2019   SPEI Comment 11/23/2018     Chemistry      Component Value Date/Time   NA 140 04/12/2019 1034   NA 142 04/23/2017 1431   NA 139 07/18/2016 1135   K  4.1 04/12/2019 1034   K 4.0 04/23/2017 1431   K 4.4 07/18/2016 1135   CL 108 04/12/2019 1034   CL 105 04/23/2017 1431   CO2 24 04/12/2019 1034   CO2 28 04/23/2017 1431   CO2 27 07/18/2016 1135   BUN 21 04/12/2019 1034   BUN 20 04/23/2017 1431   BUN 12.2 07/18/2016 1135   CREATININE 1.03 04/12/2019 1034   CREATININE 1.2 04/23/2017 1431   CREATININE 0.9 07/18/2016 1135      Component Value Date/Time   CALCIUM 9.1 04/12/2019 1034   CALCIUM 9.3 04/23/2017 1431   CALCIUM 8.9 07/18/2016 1135   ALKPHOS 42 04/12/2019 1034   ALKPHOS 64 04/23/2017 1431   ALKPHOS 58 07/18/2016 1135   AST 27 04/12/2019 1034   AST 15 07/18/2016 1135   ALT 33 04/12/2019 1034   ALT 22 04/23/2017 1431   ALT 20 07/18/2016 1135   BILITOT 0.9 04/12/2019 1034   BILITOT 1.24 (H) 07/18/2016 1135      Impression and Plan: Mr. 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   Hopefully, the light chain results will hold steady.  We now have him on every 2-week protocol.  This I think should be able to help.  His quality of life continues to do quite well.  I will see him back here in 2 weeks.  I want to see him back before he heads [place] to the beach for the winter and spring.         [name], MD 11/16/202012:11 PM

## 2019-04-13 LAB — KAPPA/LAMBDA LIGHT CHAINS
Kappa free light chain: 25.1 mg/L — ABNORMAL HIGH (ref 3.3–19.4)
Kappa, lambda light chain ratio: 13.94 — ABNORMAL HIGH (ref 0.26–1.65)
Lambda free light chains: 1.8 mg/L — ABNORMAL LOW (ref 5.7–26.3)

## 2019-04-13 LAB — IGG, IGA, IGM
IgA: 6 mg/dL — ABNORMAL LOW (ref 61–437)
IgG (Immunoglobin G), Serum: 826 mg/dL (ref 603–1613)
IgM (Immunoglobulin M), Srm: <5 mg/dL — ABNORMAL LOW (ref 15–143)

## 2019-04-13 LAB — PROTEIN ELECTROPHORESIS, SERUM
A/G Ratio: 1.4 (ref 0.7–1.7)
Albumin ELP: 3.4 g/dL (ref 2.9–4.4)
Alpha-1-Globulin: 0.2 g/dL (ref 0.0–0.4)
Alpha-2-Globulin: 0.6 g/dL (ref 0.4–1.0)
Beta Globulin: 0.9 g/dL (ref 0.7–1.3)
Gamma Globulin: 0.7 g/dL (ref 0.4–1.8)
Globulin, Total: 2.4 g/dL (ref 2.2–3.9)
Total Protein ELP: 5.8 g/dL — ABNORMAL LOW (ref 6.0–8.5)

## 2019-04-15 ENCOUNTER — Telehealth: Payer: Self-pay | Admitting: *Deleted

## 2019-04-15 ENCOUNTER — Telehealth: Payer: Self-pay | Admitting: Hematology & Oncology

## 2019-04-15 NOTE — Telephone Encounter (Signed)
Patient notified per order of Dr. Marin Olp that the Kappa free light chain has decreased to 25.1.  Patient appreciative of call and has no questions or concerns at this time.

## 2019-04-15 NOTE — Telephone Encounter (Signed)
Faxed medical records to: [facility] P: [id number] F: [phone]  09/25/2018 - 04/13/2019 REQ     COPY SCANNED

## 2019-04-26 ENCOUNTER — Inpatient Hospital Stay (HOSPITAL_BASED_OUTPATIENT_CLINIC_OR_DEPARTMENT_OTHER): Payer: Medicare Other | Admitting: Hematology & Oncology

## 2019-04-26 ENCOUNTER — Encounter: Payer: Self-pay | Admitting: Hematology & Oncology

## 2019-04-26 ENCOUNTER — Other Ambulatory Visit: Payer: Self-pay

## 2019-04-26 ENCOUNTER — Inpatient Hospital Stay: Payer: Medicare Other

## 2019-04-26 VITALS — Wt 205.0 lb

## 2019-04-26 VITALS — BP 139/74 | HR 66 | Temp 97.1°F | Resp 18

## 2019-04-26 DIAGNOSIS — C9000 Multiple myeloma not having achieved remission: Secondary | ICD-10-CM

## 2019-04-26 DIAGNOSIS — C9002 Multiple myeloma in relapse: Secondary | ICD-10-CM | POA: Diagnosis not present

## 2019-04-26 DIAGNOSIS — Z5112 Encounter for antineoplastic immunotherapy: Secondary | ICD-10-CM | POA: Diagnosis not present

## 2019-04-26 DIAGNOSIS — E119 Type 2 diabetes mellitus without complications: Secondary | ICD-10-CM | POA: Diagnosis not present

## 2019-04-26 DIAGNOSIS — D801 Nonfamilial hypogammaglobulinemia: Secondary | ICD-10-CM

## 2019-04-26 LAB — CMP (CANCER CENTER ONLY)
ALT: 26 U/L (ref 0–44)
AST: 25 U/L (ref 15–41)
Albumin: 4 g/dL (ref 3.5–5.0)
Alkaline Phosphatase: 47 U/L (ref 38–126)
Anion gap: 6 (ref 5–15)
BUN: 26 mg/dL — ABNORMAL HIGH (ref 8–23)
CO2: 25 mmol/L (ref 22–32)
Calcium: 8.9 mg/dL (ref 8.9–10.3)
Chloride: 106 mmol/L (ref 98–111)
Creatinine: 0.98 mg/dL (ref 0.61–1.24)
GFR, Est AFR Am: >60 mL/min (ref >60)
GFR, Estimated: >60 mL/min (ref >60)
Glucose, Bld: 108 mg/dL — ABNORMAL HIGH (ref 70–99)
Potassium: 3.9 mmol/L (ref 3.5–5.1)
Sodium: 137 mmol/L (ref 135–145)
Total Bilirubin: 1 mg/dL (ref 0.3–1.2)
Total Protein: 6.1 g/dL — ABNORMAL LOW (ref 6.5–8.1)

## 2019-04-26 LAB — CBC WITH DIFFERENTIAL (CANCER CENTER ONLY)
Abs Immature Granulocytes: 0.01 10*3/uL (ref 0.00–0.07)
Basophils Absolute: 0.1 10*3/uL (ref 0.0–0.1)
Basophils Relative: 1 %
Eosinophils Absolute: 0.1 10*3/uL (ref 0.0–0.5)
Eosinophils Relative: 1 %
HCT: 44.5 % (ref 39.0–52.0)
Hemoglobin: 15.4 g/dL (ref 13.0–17.0)
Immature Granulocytes: 0 %
Lymphocytes Relative: 31 %
Lymphs Abs: 1.1 10*3/uL (ref 0.7–4.0)
MCH: 35.6 pg — ABNORMAL HIGH (ref 26.0–34.0)
MCHC: 34.6 g/dL (ref 30.0–36.0)
MCV: 103 fL — ABNORMAL HIGH (ref 80.0–100.0)
Monocytes Absolute: 0.6 10*3/uL (ref 0.1–1.0)
Monocytes Relative: 16 %
Neutro Abs: 1.8 10*3/uL (ref 1.7–7.7)
Neutrophils Relative %: 51 %
Platelet Count: 147 10*3/uL — ABNORMAL LOW (ref 150–400)
RBC: 4.32 MIL/uL (ref 4.22–5.81)
RDW: 12.9 % (ref 11.5–15.5)
WBC Count: 3.6 10*3/uL — ABNORMAL LOW (ref 4.0–10.5)
nRBC: 0 % (ref 0.0–0.2)

## 2019-04-26 MED ORDER — DIPHENHYDRAMINE HCL 25 MG PO CAPS
ORAL_CAPSULE | ORAL | Status: AC
  Filled 2019-04-26: qty 2

## 2019-04-26 MED ORDER — SODIUM CHLORIDE 0.9% FLUSH
10.0000 mL | Freq: Once | INTRAVENOUS | Status: AC
  Administered 2019-04-26: 10 mL
  Filled 2019-04-26: qty 10

## 2019-04-26 MED ORDER — DIPHENHYDRAMINE HCL 25 MG PO CAPS
50.0000 mg | ORAL_CAPSULE | Freq: Once | ORAL | Status: AC
  Administered 2019-04-26: 50 mg via ORAL

## 2019-04-26 MED ORDER — SODIUM CHLORIDE 0.9 % IV SOLN
Freq: Once | INTRAVENOUS | Status: AC
  Administered 2019-04-26: 13:00:00 via INTRAVENOUS
  Filled 2019-04-26: qty 250

## 2019-04-26 MED ORDER — ACETAMINOPHEN 325 MG PO TABS
650.0000 mg | ORAL_TABLET | Freq: Once | ORAL | Status: AC
  Administered 2019-04-26: 650 mg via ORAL

## 2019-04-26 MED ORDER — DEXTROSE 5 % IV SOLN
71.0000 mg/m2 | Freq: Once | INTRAVENOUS | Status: AC
  Administered 2019-04-26: 150 mg via INTRAVENOUS
  Filled 2019-04-26: qty 60

## 2019-04-26 MED ORDER — SODIUM CHLORIDE 0.9 % IV SOLN
20.0000 mg | Freq: Once | INTRAVENOUS | Status: AC
  Administered 2019-04-26: 20 mg via INTRAVENOUS
  Filled 2019-04-26: qty 2

## 2019-04-26 MED ORDER — PROCHLORPERAZINE MALEATE 10 MG PO TABS
ORAL_TABLET | ORAL | Status: AC
  Filled 2019-04-26: qty 1

## 2019-04-26 MED ORDER — DEXTROSE 5 % IV SOLN
71.0000 mg/m2 | Freq: Once | INTRAVENOUS | Status: DC
  Filled 2019-04-26: qty 75

## 2019-04-26 MED ORDER — IMMUNE GLOBULIN (HUMAN) 20 GM/200ML IV SOLN
40.0000 g | Freq: Once | INTRAVENOUS | Status: AC
  Administered 2019-04-26: 40 g via INTRAVENOUS
  Filled 2019-04-26: qty 400

## 2019-04-26 MED ORDER — ACETAMINOPHEN 325 MG PO TABS
ORAL_TABLET | ORAL | Status: AC
  Filled 2019-04-26: qty 2

## 2019-04-26 MED ORDER — HEPARIN SOD (PORK) LOCK FLUSH 100 UNIT/ML IV SOLN
500.0000 [IU] | Freq: Once | INTRAVENOUS | Status: AC | PRN
  Administered 2019-04-26: 500 [IU]
  Filled 2019-04-26: qty 5

## 2019-04-26 MED ORDER — SODIUM CHLORIDE 0.9% FLUSH
10.0000 mL | INTRAVENOUS | Status: DC | PRN
  Administered 2019-04-26: 10 mL
  Filled 2019-04-26: qty 10

## 2019-04-26 MED ORDER — DEXTROSE 5 % IV SOLN
INTRAVENOUS | Status: DC
  Administered 2019-04-26: 11:00:00 via INTRAVENOUS
  Filled 2019-04-26: qty 250

## 2019-04-26 MED ORDER — PROCHLORPERAZINE MALEATE 10 MG PO TABS
10.0000 mg | ORAL_TABLET | Freq: Once | ORAL | Status: AC
  Administered 2019-04-26: 10 mg via ORAL

## 2019-04-26 NOTE — Patient Instructions (Signed)

## 2019-04-26 NOTE — Progress Notes (Signed)
Hematology and Oncology Follow Up Visit  Rickey [name] [id number] 1941-03-18 78 y.o. 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 Elotuzumab/Pomalidomide/decadron -every 4 weeks- start 01/12/2018 Kyprolis/Pomalidomide/Decadron -- s/p cycle #8 -- d/c pomaldomide in 01/2019 IVIG 40 mg IV q month   Interim History:  Rickey [name] is here today for follow-up.  He and his wife are headed to their beach house tomorrow.  They live in early given the [place] that is going [place] today.  He and his wife had a very nice Thanksgiving.  They basically were at home.  Thankfully, the myeloma has been doing quite well.  We checked his kappa light chain level the last time, it was still going down.  It was 2.5 mg/dL.  He is doing well with his diabetes.  He is watching his blood sugars very closely.  He has a very nifty device that really helps out his blood sugar control.  He has had no problems with infections.  We do give him IVIG.  Her last checked his IgG level in November, it was 826 mg/dL.  I think this really has helped with any issues with pneumonia or other infections.  He has had no change in bowel or bladder habits.  There has been no issues with rashes.  He has had no leg swelling.  He has had no neuropathy in his feet.  Multiple joint medial ng these.  Overall, I would say his performance status is ECOG 1.       Medications:  Allergies as of 04/26/2019   No Known Allergies     Medication List       Accurate as of April 26, 2019 11:36 AM. If you have any questions, ask your nurse or doctor.        acyclovir 400 MG tablet Commonly known as: ZOVIRAX TAKE 1 TABLET BY MOUTH TWICE DAILY   aspirin 81  MG tablet Take 81 mg by mouth daily.   atorvastatin 10 MG tablet Commonly known as: LIPITOR Take 10 mg by mouth daily.   calcium-vitamin D 250-125 MG-UNIT tablet Commonly known as: OSCAL Take 1 tablet by mouth daily.   cycloSPORINE 0.05 % ophthalmic emulsion Commonly known as: RESTASIS Place 1 drop into both eyes 2 (two) times daily.   fish oil-omega-3 fatty acids 1000 MG capsule Take 1 g by mouth 2 (two) times daily.   INSULIN LISP & LISP PROT (HUM) [place]   insulin lispro 100 UNIT/ML injection Commonly known as: HUMALOG Inject into the skin. Insulin pump 12/21/2018 Typically 80U daily.   irbesartan-hydrochlorothiazide 300-12.5 MG tablet Commonly known as: AVALIDE Take 1 tablet by mouth daily.   lidocaine-prilocaine cream Commonly known as: EMLA Apply 1 application topically as needed.   montelukast 10 MG tablet Commonly known as: SINGULAIR Take 10 mg by mouth at bedtime.   Myrbetriq 50 MG Tb24 tablet Generic drug: mirabegron ER Take 50 mg by mouth daily.   ondansetron 4 MG disintegrating tablet Commonly known as: ZOFRAN-ODT Take 4 mg by mouth 4 (four) times daily as needed for nausea or vomiting.   Vitamin D 50 MCG (2000 UT) Caps Take by mouth every morning.  Allergies: No Known Allergies  Past Medical History, Surgical history, Social history, and Family History were reviewed and updated.  Review of Systems: Review of Systems  Constitutional: Negative.   HENT: Negative.   Eyes: Negative.   Respiratory: Negative.   Cardiovascular: Negative.   Gastrointestinal: Negative.   Genitourinary: Negative.   Musculoskeletal: Negative.   Skin: Negative.   Neurological: Negative.   Endo/Heme/Allergies: Negative.   Psychiatric/Behavioral: Negative.      Physical Exam:  weight is 205 lb (93 kg).   Wt Readings from Last 3 Encounters:  04/26/19 205 lb (93 kg)  04/12/19 204 lb (92.5 kg)  02/15/19 202 lb (91.6 kg)    Physical Exam Vitals signs  reviewed.  HENT:     Head: Normocephalic and atraumatic.  Eyes:     Pupils: Pupils are equal, round, and reactive to light.  Neck:     Musculoskeletal: Normal range of motion.  Cardiovascular:     Rate and Rhythm: Normal rate and regular rhythm.     Heart sounds: Normal heart sounds.  Pulmonary:     Effort: Pulmonary effort is normal.     Breath sounds: Normal breath sounds.  Abdominal:     General: Bowel sounds are normal.     Palpations: Abdomen is soft.  Musculoskeletal: Normal range of motion.        General: No tenderness or deformity.  Lymphadenopathy:     Cervical: No cervical adenopathy.  Skin:    General: Skin is warm and dry.     Findings: No erythema or rash.  Neurological:     Mental Status: He is alert and oriented to person, place, and time.  Psychiatric:        Behavior: Behavior normal.        Thought Content: Thought content normal.        Judgment: Judgment normal.      Lab Results  Component Value Date   WBC 3.6 (L) 04/26/2019   HGB 15.4 04/26/2019   HCT 44.5 04/26/2019   MCV 103.0 (H) 04/26/2019   PLT 147 (L) 04/26/2019   Lab Results  Component Value Date   FERRITIN 258 12/28/2018   IRON 98 12/28/2018   TIBC 253 12/28/2018   UIBC 154 12/28/2018   IRONPCTSAT 39 12/28/2018   Lab Results  Component Value Date   RBC 4.32 04/26/2019   Lab Results  Component Value Date   KPAFRELGTCHN 25.1 (H) 04/12/2019   LAMBDASER 1.8 (L) 04/12/2019   KAPLAMBRATIO 13.94 (H) 04/12/2019   Lab Results  Component Value Date   IGGSERUM 826 04/12/2019   IGA 6 (L) 04/12/2019   IGMSERUM <5 (L) 04/12/2019   Lab Results  Component Value Date   TOTALPROTELP 5.8 (L) 04/12/2019   ALBUMINELP 3.4 04/12/2019   A1GS 0.2 04/12/2019   A2GS 0.6 04/12/2019   BETS 0.9 04/12/2019   BETA2SER 0.3 04/19/2015   GAMS 0.7 04/12/2019   MSPIKE Not Observed 04/12/2019   SPEI Comment 04/12/2019     Chemistry      Component Value Date/Time   NA 140 04/12/2019 1034   NA  142 04/23/2017 1431   NA 139 07/18/2016 1135   K 4.1 04/12/2019 1034   K 4.0 04/23/2017 1431   K 4.4 07/18/2016 1135   CL 108 04/12/2019 1034   CL 105 04/23/2017 1431   CO2 24 04/12/2019 1034   CO2 28 04/23/2017 1431   CO2 27 07/18/2016 1135   BUN 21 04/12/2019 1034   BUN  20 04/23/2017 1431   BUN 12.2 07/18/2016 1135   CREATININE 1.03 04/12/2019 1034   CREATININE 1.2 04/23/2017 1431   CREATININE 0.9 07/18/2016 1135      Component Value Date/Time   CALCIUM 9.1 04/12/2019 1034   CALCIUM 9.3 04/23/2017 1431   CALCIUM 8.9 07/18/2016 1135   ALKPHOS 42 04/12/2019 1034   ALKPHOS 64 04/23/2017 1431   ALKPHOS 58 07/18/2016 1135   AST 27 04/12/2019 1034   AST 15 07/18/2016 1135   ALT 33 04/12/2019 1034   ALT 22 04/23/2017 1431   ALT 20 07/18/2016 1135   BILITOT 0.9 04/12/2019 1034   BILITOT 1.24 (H) 07/18/2016 1135      Impression and Plan: Rickey [name] is a very pleasant 77 yo caucasian gentleman with 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   He is now headed down to [place].  He is going to be treated at the cancer center in [facility].  I know that he always gets incredible care from the staff down there.  [place]  Hopefully, the coronavirus vaccine will be available soon.  He will certainly let us know when it is time for him to come back.  Hopefully his myeloma will be stable over the winter and early spring.  [name], MD 11/30/202011:36 AM

## 2019-04-27 ENCOUNTER — Telehealth: Payer: Self-pay | Admitting: Hematology & Oncology

## 2019-04-27 LAB — PROTEIN ELECTROPHORESIS, SERUM, WITH REFLEX
A/G Ratio: 1.5 (ref 0.7–1.7)
Albumin ELP: 3.4 g/dL (ref 2.9–4.4)
Alpha-1-Globulin: 0.2 g/dL (ref 0.0–0.4)
Alpha-2-Globulin: 0.6 g/dL (ref 0.4–1.0)
Beta Globulin: 0.8 g/dL (ref 0.7–1.3)
Gamma Globulin: 0.5 g/dL (ref 0.4–1.8)
Globulin, Total: 2.2 g/dL (ref 2.2–3.9)
Total Protein ELP: 5.6 g/dL — ABNORMAL LOW (ref 6.0–8.5)

## 2019-04-27 LAB — KAPPA/LAMBDA LIGHT CHAINS
Kappa free light chain: 27.3 mg/L — ABNORMAL HIGH (ref 3.3–19.4)
Kappa, lambda light chain ratio: >18.2 — ABNORMAL HIGH (ref 0.26–1.65)
Lambda free light chains: <1.5 mg/L — ABNORMAL LOW (ref 5.7–26.3)

## 2019-04-27 LAB — IGG, IGA, IGM
IgA: 6 mg/dL — ABNORMAL LOW (ref 61–437)
IgG (Immunoglobin G), Serum: 664 mg/dL (ref 603–1613)
IgM (Immunoglobulin M), Srm: <5 mg/dL — ABNORMAL LOW (ref 15–143)

## 2019-04-27 NOTE — Telephone Encounter (Signed)
No los 11/30 °

## 2019-05-05 ENCOUNTER — Telehealth: Payer: Self-pay | Admitting: *Deleted

## 2019-05-05 DIAGNOSIS — C9000 Multiple myeloma not having achieved remission: Secondary | ICD-10-CM | POA: Diagnosis not present

## 2019-05-05 NOTE — Telephone Encounter (Signed)
Tracey from [facility] called and needed chemotherapy treatment orders for patient. He is scheduled for chemo on Monday, December, 14th. Faxed orders to [phone].

## 2019-06-07 DIAGNOSIS — C9000 Multiple myeloma not having achieved remission: Secondary | ICD-10-CM | POA: Diagnosis not present

## 2019-06-21 DIAGNOSIS — C9000 Multiple myeloma not having achieved remission: Secondary | ICD-10-CM | POA: Diagnosis not present

## 2019-07-05 DIAGNOSIS — C9000 Multiple myeloma not having achieved remission: Secondary | ICD-10-CM | POA: Diagnosis not present

## 2019-07-09 DIAGNOSIS — I10 Essential (primary) hypertension: Secondary | ICD-10-CM | POA: Diagnosis not present

## 2019-07-09 DIAGNOSIS — Z0000 Encounter for general adult medical examination without abnormal findings: Secondary | ICD-10-CM | POA: Diagnosis not present

## 2019-07-09 DIAGNOSIS — E109 Type 1 diabetes mellitus without complications: Secondary | ICD-10-CM | POA: Diagnosis not present

## 2019-07-09 DIAGNOSIS — Z1331 Encounter for screening for depression: Secondary | ICD-10-CM | POA: Diagnosis not present

## 2019-07-09 DIAGNOSIS — I4510 Unspecified right bundle-branch block: Secondary | ICD-10-CM | POA: Diagnosis not present

## 2019-07-09 DIAGNOSIS — E785 Hyperlipidemia, unspecified: Secondary | ICD-10-CM | POA: Diagnosis not present

## 2019-07-09 DIAGNOSIS — N400 Enlarged prostate without lower urinary tract symptoms: Secondary | ICD-10-CM | POA: Diagnosis not present

## 2019-07-09 DIAGNOSIS — E291 Testicular hypofunction: Secondary | ICD-10-CM | POA: Diagnosis not present

## 2019-07-09 DIAGNOSIS — J302 Other seasonal allergic rhinitis: Secondary | ICD-10-CM | POA: Diagnosis not present

## 2019-07-09 DIAGNOSIS — C9000 Multiple myeloma not having achieved remission: Secondary | ICD-10-CM | POA: Diagnosis not present

## 2019-07-09 DIAGNOSIS — C4491 Basal cell carcinoma of skin, unspecified: Secondary | ICD-10-CM | POA: Diagnosis not present

## 2019-07-26 DIAGNOSIS — C9000 Multiple myeloma not having achieved remission: Secondary | ICD-10-CM | POA: Diagnosis not present

## 2019-08-16 DIAGNOSIS — C9000 Multiple myeloma not having achieved remission: Secondary | ICD-10-CM | POA: Diagnosis not present

## 2019-08-19 DIAGNOSIS — C44319 Basal cell carcinoma of skin of other parts of face: Secondary | ICD-10-CM | POA: Diagnosis not present

## 2019-08-19 DIAGNOSIS — C44311 Basal cell carcinoma of skin of nose: Secondary | ICD-10-CM | POA: Diagnosis not present

## 2019-08-19 DIAGNOSIS — D485 Neoplasm of uncertain behavior of skin: Secondary | ICD-10-CM | POA: Diagnosis not present

## 2019-08-20 DIAGNOSIS — H02403 Unspecified ptosis of bilateral eyelids: Secondary | ICD-10-CM | POA: Diagnosis not present

## 2019-08-20 DIAGNOSIS — H02831 Dermatochalasis of right upper eyelid: Secondary | ICD-10-CM | POA: Diagnosis not present

## 2019-08-20 DIAGNOSIS — H02834 Dermatochalasis of left upper eyelid: Secondary | ICD-10-CM | POA: Diagnosis not present

## 2019-08-20 DIAGNOSIS — Z9842 Cataract extraction status, left eye: Secondary | ICD-10-CM | POA: Diagnosis not present

## 2019-08-20 DIAGNOSIS — Z7982 Long term (current) use of aspirin: Secondary | ICD-10-CM | POA: Diagnosis not present

## 2019-08-20 DIAGNOSIS — E109 Type 1 diabetes mellitus without complications: Secondary | ICD-10-CM | POA: Diagnosis not present

## 2019-08-20 DIAGNOSIS — Z9841 Cataract extraction status, right eye: Secondary | ICD-10-CM | POA: Diagnosis not present

## 2019-08-20 DIAGNOSIS — Z961 Presence of intraocular lens: Secondary | ICD-10-CM | POA: Diagnosis not present

## 2019-09-06 DIAGNOSIS — C9000 Multiple myeloma not having achieved remission: Secondary | ICD-10-CM | POA: Diagnosis not present

## 2019-09-27 DIAGNOSIS — C9000 Multiple myeloma not having achieved remission: Secondary | ICD-10-CM | POA: Diagnosis not present

## 2019-10-11 NOTE — Progress Notes (Signed)
Pharmacist Chemotherapy Monitoring - Follow Up Assessment    I verify that I have reviewed each item in the below checklist:  . Regimen for the patient is scheduled for the appropriate day and plan matches scheduled date. Marland Kitchen Appropriate non-routine labs are ordered dependent on drug ordered. . If applicable, additional medications reviewed and ordered per protocol based on lifetime cumulative doses and/or treatment regimen.   Plan for follow-up and/or issues identified: No . I-vent associated with next due treatment: No . MD and/or nursing notified: No   Pt getting Privigen + Kyprolis on 5/24 per Dr. Marin Olp.  Kennith [facility] P 10/11/2019 2:56 PM

## 2019-10-12 DIAGNOSIS — H02834 Dermatochalasis of left upper eyelid: Secondary | ICD-10-CM | POA: Diagnosis not present

## 2019-10-12 DIAGNOSIS — E103211 Type 1 diabetes mellitus with mild nonproliferative diabetic retinopathy with macular edema, right eye: Secondary | ICD-10-CM | POA: Diagnosis not present

## 2019-10-12 DIAGNOSIS — H02831 Dermatochalasis of right upper eyelid: Secondary | ICD-10-CM | POA: Diagnosis not present

## 2019-10-12 DIAGNOSIS — H35371 Puckering of macula, right eye: Secondary | ICD-10-CM | POA: Diagnosis not present

## 2019-10-12 DIAGNOSIS — Z961 Presence of intraocular lens: Secondary | ICD-10-CM | POA: Diagnosis not present

## 2019-10-14 DIAGNOSIS — L570 Actinic keratosis: Secondary | ICD-10-CM | POA: Diagnosis not present

## 2019-10-14 DIAGNOSIS — D1801 Hemangioma of skin and subcutaneous tissue: Secondary | ICD-10-CM | POA: Diagnosis not present

## 2019-10-14 DIAGNOSIS — Z85828 Personal history of other malignant neoplasm of skin: Secondary | ICD-10-CM | POA: Diagnosis not present

## 2019-10-14 DIAGNOSIS — C44519 Basal cell carcinoma of skin of other part of trunk: Secondary | ICD-10-CM | POA: Diagnosis not present

## 2019-10-14 DIAGNOSIS — D492 Neoplasm of unspecified behavior of bone, soft tissue, and skin: Secondary | ICD-10-CM | POA: Diagnosis not present

## 2019-10-14 DIAGNOSIS — L821 Other seborrheic keratosis: Secondary | ICD-10-CM | POA: Diagnosis not present

## 2019-10-14 DIAGNOSIS — C44719 Basal cell carcinoma of skin of left lower limb, including hip: Secondary | ICD-10-CM | POA: Diagnosis not present

## 2019-10-15 DIAGNOSIS — E109 Type 1 diabetes mellitus without complications: Secondary | ICD-10-CM | POA: Diagnosis not present

## 2019-10-15 DIAGNOSIS — I10 Essential (primary) hypertension: Secondary | ICD-10-CM | POA: Diagnosis not present

## 2019-10-15 DIAGNOSIS — C9000 Multiple myeloma not having achieved remission: Secondary | ICD-10-CM | POA: Diagnosis not present

## 2019-10-18 ENCOUNTER — Other Ambulatory Visit: Payer: Self-pay

## 2019-10-18 ENCOUNTER — Other Ambulatory Visit: Payer: Self-pay | Admitting: Family

## 2019-10-18 ENCOUNTER — Inpatient Hospital Stay: Payer: Medicare Other

## 2019-10-18 ENCOUNTER — Other Ambulatory Visit: Payer: Self-pay | Admitting: *Deleted

## 2019-10-18 ENCOUNTER — Inpatient Hospital Stay: Payer: Medicare Other | Attending: Hematology & Oncology | Admitting: Hematology & Oncology

## 2019-10-18 ENCOUNTER — Encounter: Payer: Self-pay | Admitting: Hematology & Oncology

## 2019-10-18 VITALS — BP 116/57 | HR 73 | Temp 97.3°F | Resp 18 | Wt 209.0 lb

## 2019-10-18 DIAGNOSIS — C9002 Multiple myeloma in relapse: Secondary | ICD-10-CM | POA: Insufficient documentation

## 2019-10-18 DIAGNOSIS — Z5112 Encounter for antineoplastic immunotherapy: Secondary | ICD-10-CM | POA: Insufficient documentation

## 2019-10-18 DIAGNOSIS — C9000 Multiple myeloma not having achieved remission: Secondary | ICD-10-CM | POA: Diagnosis not present

## 2019-10-18 DIAGNOSIS — D801 Nonfamilial hypogammaglobulinemia: Secondary | ICD-10-CM

## 2019-10-18 LAB — CBC WITH DIFFERENTIAL (CANCER CENTER ONLY)
Abs Immature Granulocytes: 0 10*3/uL (ref 0.00–0.07)
Basophils Absolute: 0.1 10*3/uL (ref 0.0–0.1)
Basophils Relative: 1 %
Eosinophils Absolute: 0.1 10*3/uL (ref 0.0–0.5)
Eosinophils Relative: 2 %
HCT: 45.9 % (ref 39.0–52.0)
Hemoglobin: 15.7 g/dL (ref 13.0–17.0)
Immature Granulocytes: 0 %
Lymphocytes Relative: 30 %
Lymphs Abs: 1.1 10*3/uL (ref 0.7–4.0)
MCH: 34.8 pg — ABNORMAL HIGH (ref 26.0–34.0)
MCHC: 34.2 g/dL (ref 30.0–36.0)
MCV: 101.8 fL — ABNORMAL HIGH (ref 80.0–100.0)
Monocytes Absolute: 0.5 10*3/uL (ref 0.1–1.0)
Monocytes Relative: 15 %
Neutro Abs: 1.8 10*3/uL (ref 1.7–7.7)
Neutrophils Relative %: 52 %
Platelet Count: 132 10*3/uL — ABNORMAL LOW (ref 150–400)
RBC: 4.51 MIL/uL (ref 4.22–5.81)
RDW: 12.6 % (ref 11.5–15.5)
WBC Count: 3.5 10*3/uL — ABNORMAL LOW (ref 4.0–10.5)
nRBC: 0 % (ref 0.0–0.2)

## 2019-10-18 LAB — CMP (CANCER CENTER ONLY)
ALT: 31 U/L (ref 0–44)
AST: 32 U/L (ref 15–41)
Albumin: 3.7 g/dL (ref 3.5–5.0)
Alkaline Phosphatase: 46 U/L (ref 38–126)
Anion gap: 7 (ref 5–15)
BUN: 23 mg/dL (ref 8–23)
CO2: 24 mmol/L (ref 22–32)
Calcium: 8.7 mg/dL — ABNORMAL LOW (ref 8.9–10.3)
Chloride: 104 mmol/L (ref 98–111)
Creatinine: 1.01 mg/dL (ref 0.61–1.24)
GFR, Est AFR Am: >60 mL/min (ref >60)
GFR, Estimated: >60 mL/min (ref >60)
Glucose, Bld: 321 mg/dL — ABNORMAL HIGH (ref 70–99)
Potassium: 4.2 mmol/L (ref 3.5–5.1)
Sodium: 135 mmol/L (ref 135–145)
Total Bilirubin: 1.2 mg/dL (ref 0.3–1.2)
Total Protein: 5.7 g/dL — ABNORMAL LOW (ref 6.5–8.1)

## 2019-10-18 MED ORDER — DIPHENHYDRAMINE HCL 25 MG PO CAPS
50.0000 mg | ORAL_CAPSULE | Freq: Once | ORAL | Status: AC
  Administered 2019-10-18: 50 mg via ORAL

## 2019-10-18 MED ORDER — ONDANSETRON 4 MG PO TBDP: 4.0000 mg | ORAL_TABLET | Freq: Four times a day (QID) | ORAL | 3 refills | Status: DC | PRN

## 2019-10-18 MED ORDER — ACETAMINOPHEN 325 MG PO TABS
650.0000 mg | ORAL_TABLET | Freq: Once | ORAL | Status: AC
  Administered 2019-10-18: 650 mg via ORAL

## 2019-10-18 MED ORDER — DEXTROSE 5 % IV SOLN
71.0000 mg/m2 | Freq: Once | INTRAVENOUS | Status: AC
  Administered 2019-10-18: 150 mg via INTRAVENOUS
  Filled 2019-10-18: qty 60

## 2019-10-18 MED ORDER — PROCHLORPERAZINE MALEATE 10 MG PO TABS
ORAL_TABLET | ORAL | Status: AC
  Filled 2019-10-18: qty 1

## 2019-10-18 MED ORDER — SODIUM CHLORIDE 0.9 % IV SOLN
20.0000 mg | Freq: Once | INTRAVENOUS | Status: AC
  Administered 2019-10-18: 20 mg via INTRAVENOUS
  Filled 2019-10-18: qty 2

## 2019-10-18 MED ORDER — HEPARIN SOD (PORK) LOCK FLUSH 100 UNIT/ML IV SOLN
500.0000 [IU] | Freq: Once | INTRAVENOUS | Status: AC | PRN
  Administered 2019-10-18: 500 [IU]
  Filled 2019-10-18: qty 5

## 2019-10-18 MED ORDER — LIDOCAINE-PRILOCAINE 2.5-2.5 % EX CREA: 1.0000 "application " | TOPICAL_CREAM | CUTANEOUS | 3 refills | Status: AC | PRN

## 2019-10-18 MED ORDER — SODIUM CHLORIDE 0.9% FLUSH
10.0000 mL | INTRAVENOUS | Status: DC | PRN
  Administered 2019-10-18: 10 mL
  Filled 2019-10-18: qty 10

## 2019-10-18 MED ORDER — ACETAMINOPHEN 325 MG PO TABS
ORAL_TABLET | ORAL | Status: AC
  Filled 2019-10-18: qty 2

## 2019-10-18 MED ORDER — SODIUM CHLORIDE 0.9 % IV SOLN
Freq: Once | INTRAVENOUS | Status: DC
  Filled 2019-10-18: qty 250

## 2019-10-18 MED ORDER — DIPHENHYDRAMINE HCL 25 MG PO CAPS
ORAL_CAPSULE | ORAL | Status: AC
  Filled 2019-10-18: qty 2

## 2019-10-18 MED ORDER — IMMUNE GLOBULIN (HUMAN) 20 GM/200ML IV SOLN
40.0000 g | Freq: Once | INTRAVENOUS | Status: AC
  Administered 2019-10-18: 40 g via INTRAVENOUS
  Filled 2019-10-18: qty 400

## 2019-10-18 MED ORDER — PROCHLORPERAZINE MALEATE 10 MG PO TABS
10.0000 mg | ORAL_TABLET | Freq: Once | ORAL | Status: AC
  Administered 2019-10-18: 10 mg via ORAL

## 2019-10-18 MED ORDER — SODIUM CHLORIDE 0.9 % IV SOLN
Freq: Once | INTRAVENOUS | Status: AC
  Filled 2019-10-18: qty 250

## 2019-10-18 NOTE — Progress Notes (Signed)
Hematology and Oncology Follow Up Visit  AAHAN [name] [id number] 09/23/1940 79 y.o. 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 Elotuzumab/Pomalidomide/decadron -every 4 weeks- start 01/12/2018 Kyprolis/Pomalidomide/Decadron -- s/p cycle #8 -- d/c pomaldomide in 01/2019 IVIG 40 mg IV q 6 weeks   Interim History:  [name] [place]  He and his wife go down to their beach house for the wintertime.  He now is back at the [facility].  As always, he got fantastic care at the cancer center in [facility].  He feels well.  He really has no specific complaints.  He is on the insulin pump.  He is being very diligent with controlling his blood sugars.  He has had no fever.  He has had his coronavirus vaccines.  [place]  He has had no problems with cough or shortness of breath.  He has had no change in bowel or bladder habits.  There is been no leg swelling.  He has had no issues with neuropathy.  We last saw him back in November 2020, his kappa light chain was 2.7 mg/dL.  Hopefully, he and his wife will be able to do some traveling this summer.  He has had no issues with headache.  He has had no mouth sores.  He has had no obvious cardiac issues.   Overall, I would say his performance status is ECOG 1.       Medications:  Allergies as of 10/18/2019   No Known Allergies     Medication List       Accurate as of Oct 18, 2019  9:25 AM. If you have any questions, ask your nurse or doctor.        acyclovir 400 MG tablet Commonly known as: ZOVIRAX TAKE 1 TABLET BY MOUTH TWICE DAILY   aspirin 81 MG  tablet Take 81 mg by mouth daily.   atorvastatin 10 MG tablet Commonly known as: LIPITOR Take 10 mg by mouth daily.   calcium-vitamin D 250-125 MG-UNIT tablet Commonly known as: OSCAL Take 1 tablet by mouth daily.   cycloSPORINE 0.05 % ophthalmic emulsion Commonly known as: RESTASIS Place 1 drop into both eyes 2 (two) times daily.   fish oil-omega-3 fatty acids 1000 MG capsule Take 1 g by mouth 2 (two) times daily.   INSULIN LISP & LISP PROT (HUM) [place]   insulin lispro 100 UNIT/ML injection Commonly known as: HUMALOG Inject into the skin. Insulin pump 12/21/2018 Typically 80U daily.   irbesartan-hydrochlorothiazide 300-12.5 MG tablet Commonly known as: AVALIDE Take 1 tablet by mouth daily.   lidocaine-prilocaine cream Commonly known as: EMLA Apply 1 application topically as needed.   montelukast 10 MG tablet Commonly known as: SINGULAIR Take 10 mg by mouth at bedtime.   Myrbetriq 50 MG Tb24 tablet Generic drug: mirabegron ER Take 50 mg by mouth daily.   ondansetron 4 MG disintegrating tablet Commonly known as: ZOFRAN-ODT Take 4 mg by mouth 4 (four) times daily as needed for nausea or vomiting.   Vitamin D 50 MCG (2000 UT) Caps Take by mouth every morning.  Allergies: No Known Allergies  Past Medical History, Surgical history, Social history, and Family History were reviewed and updated.  Review of Systems: Review of Systems  Constitutional: Negative.   HENT: Negative.   Eyes: Negative.   Respiratory: Negative.   Cardiovascular: Negative.   Gastrointestinal: Negative.   Genitourinary: Negative.   Musculoskeletal: Negative.   Skin: Negative.   Neurological: Negative.   Endo/Heme/Allergies: Negative.   Psychiatric/Behavioral: Negative.      Physical Exam:  vitals were not taken for this visit.   Wt Readings from Last 3 Encounters:  04/26/19 205 lb (93 kg)  04/12/19 204 lb (92.5 kg)  02/15/19 202 lb (91.6 kg)    Physical Exam Vitals  reviewed.  HENT:     Head: Normocephalic and atraumatic.  Eyes:     Pupils: Pupils are equal, round, and reactive to light.  Cardiovascular:     Rate and Rhythm: Normal rate and regular rhythm.     Heart sounds: Normal heart sounds.  Pulmonary:     Effort: Pulmonary effort is normal.     Breath sounds: Normal breath sounds.  Abdominal:     General: Bowel sounds are normal.     Palpations: Abdomen is soft.  Musculoskeletal:        General: No tenderness or deformity. Normal range of motion.     Cervical back: Normal range of motion.  Lymphadenopathy:     Cervical: No cervical adenopathy.  Skin:    General: Skin is warm and dry.     Findings: No erythema or rash.  Neurological:     Mental Status: He is alert and oriented to person, place, and time.  Psychiatric:        Behavior: Behavior normal.        Thought Content: Thought content normal.        Judgment: Judgment normal.      Lab Results  Component Value Date   WBC 3.5 (L) 10/18/2019   HGB 15.7 10/18/2019   HCT 45.9 10/18/2019   MCV 101.8 (H) 10/18/2019   PLT 132 (L) 10/18/2019   Lab Results  Component Value Date   FERRITIN 258 12/28/2018   IRON 98 12/28/2018   TIBC 253 12/28/2018   UIBC 154 12/28/2018   IRONPCTSAT 39 12/28/2018   Lab Results  Component Value Date   RBC 4.51 10/18/2019   Lab Results  Component Value Date   KPAFRELGTCHN 27.3 (H) 04/26/2019   LAMBDASER <1.5 (L) 04/26/2019   KAPLAMBRATIO >18.20 (H) 04/26/2019   Lab Results  Component Value Date   IGGSERUM 664 04/26/2019   IGA 6 (L) 04/26/2019   IGMSERUM <5 (L) 04/26/2019   Lab Results  Component Value Date   TOTALPROTELP 5.6 (L) 04/26/2019   ALBUMINELP 3.4 04/26/2019   A1GS 0.2 04/26/2019   A2GS 0.6 04/26/2019   BETS 0.8 04/26/2019   BETA2SER 0.3 04/19/2015   GAMS 0.5 04/26/2019   MSPIKE Not Observed 04/26/2019   SPEI Comment 04/12/2019     Chemistry      Component Value Date/Time   NA 135 10/18/2019 0852   NA 142  04/23/2017 1431   NA 139 07/18/2016 1135   K 4.2 10/18/2019 0852   K 4.0 04/23/2017 1431   K 4.4 07/18/2016 1135   CL 104 10/18/2019 0852   CL 105 04/23/2017 1431   CO2 24 10/18/2019 0852   CO2 28 04/23/2017 1431   CO2 27 07/18/2016 1135   BUN 23 10/18/2019 0852   BUN 20  04/23/2017 1431   BUN 12.2 07/18/2016 1135   CREATININE 1.01 10/18/2019 0852   CREATININE 1.2 04/23/2017 1431   CREATININE 0.9 07/18/2016 1135      Component Value Date/Time   CALCIUM 8.7 (L) 10/18/2019 0852   CALCIUM 9.3 04/23/2017 1431   CALCIUM 8.9 07/18/2016 1135   ALKPHOS 46 10/18/2019 0852   ALKPHOS 64 04/23/2017 1431   ALKPHOS 58 07/18/2016 1135   AST 32 10/18/2019 0852   AST 15 07/18/2016 1135   ALT 31 10/18/2019 0852   ALT 22 04/23/2017 1431   ALT 20 07/18/2016 1135   BILITOT 1.2 10/18/2019 0852   BILITOT 1.24 (H) 07/18/2016 1135      Impression and Plan: Mr. Rickey [name] is a very pleasant 79 yo caucasian gentleman with 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   I am just so happy that he is done nicely.  We will have to see what his myeloma levels are.  At some point, we probably should check a echocardiogram on him.  I think this would be reasonable given that he has been on Kyprolis for quite a while.  We will go ahead with the Kyprolis in IVIG today.  He does get IVIG so that we minimize the risk of infection.  He gets the IVIG every 6 weeks.   [name], MD 5/24/20219:25 AM

## 2019-10-18 NOTE — Patient Instructions (Signed)

## 2019-10-19 ENCOUNTER — Other Ambulatory Visit: Payer: Self-pay | Admitting: *Deleted

## 2019-10-19 LAB — PROTEIN ELECTROPHORESIS, SERUM
A/G Ratio: 1.4 (ref 0.7–1.7)
Albumin ELP: 3 g/dL (ref 2.9–4.4)
Alpha-1-Globulin: 0.2 g/dL (ref 0.0–0.4)
Alpha-2-Globulin: 0.6 g/dL (ref 0.4–1.0)
Beta Globulin: 0.7 g/dL (ref 0.7–1.3)
Gamma Globulin: 0.6 g/dL (ref 0.4–1.8)
Globulin, Total: 2.2 g/dL (ref 2.2–3.9)
Total Protein ELP: 5.2 g/dL — ABNORMAL LOW (ref 6.0–8.5)

## 2019-10-19 LAB — IGG, IGA, IGM
IgA: 10 mg/dL — ABNORMAL LOW (ref 61–437)
IgG (Immunoglobin G), Serum: 580 mg/dL — ABNORMAL LOW (ref 603–1613)
IgM (Immunoglobulin M), Srm: <5 mg/dL — ABNORMAL LOW (ref 15–143)

## 2019-10-19 LAB — KAPPA/LAMBDA LIGHT CHAINS
Kappa free light chain: 29.4 mg/L — ABNORMAL HIGH (ref 3.3–19.4)
Kappa, lambda light chain ratio: >19.6 — ABNORMAL HIGH (ref 0.26–1.65)
Lambda free light chains: <1.5 mg/L — ABNORMAL LOW (ref 5.7–26.3)

## 2019-10-19 MED ORDER — ONDANSETRON HCL 8 MG PO TABS
8.0000 mg | ORAL_TABLET | Freq: Three times a day (TID) | ORAL | 3 refills | Status: DC | PRN
Start: 2019-10-19 — End: 2019-10-26

## 2019-10-21 ENCOUNTER — Other Ambulatory Visit: Payer: Self-pay | Admitting: Hematology & Oncology

## 2019-10-21 DIAGNOSIS — C9000 Multiple myeloma not having achieved remission: Secondary | ICD-10-CM

## 2019-10-26 ENCOUNTER — Other Ambulatory Visit: Payer: Self-pay

## 2019-10-26 DIAGNOSIS — C9000 Multiple myeloma not having achieved remission: Secondary | ICD-10-CM

## 2019-10-26 MED ORDER — ONDANSETRON HCL 8 MG PO TABS: 8.0000 mg | ORAL_TABLET | Freq: Three times a day (TID) | ORAL | 3 refills | Status: DC | PRN

## 2019-11-01 ENCOUNTER — Ambulatory Visit: Payer: Medicare Other | Admitting: Hematology & Oncology

## 2019-11-01 ENCOUNTER — Ambulatory Visit: Payer: Medicare Other

## 2019-11-01 ENCOUNTER — Other Ambulatory Visit: Payer: Medicare Other

## 2019-11-01 NOTE — Progress Notes (Signed)
Pharmacist Chemotherapy Monitoring - Follow Up Assessment    I verify that I have reviewed each item in the below checklist:  . Regimen for the patient is scheduled for the appropriate day and plan matches scheduled date. Marland Kitchen Appropriate non-routine labs are ordered dependent on drug ordered. . If applicable, additional medications reviewed and ordered per protocol based on lifetime cumulative doses and/or treatment regimen.   Plan for follow-up and/or issues identified: No . I-vent associated with next due treatment: No . MD and/or nursing notified: No  Rickey [name], Jacqlyn Larsen 11/01/2019 8:19 AM

## 2019-11-08 ENCOUNTER — Inpatient Hospital Stay: Payer: Medicare Other

## 2019-11-08 ENCOUNTER — Inpatient Hospital Stay: Payer: Medicare Other | Attending: Hematology & Oncology | Admitting: Hematology & Oncology

## 2019-11-08 ENCOUNTER — Encounter: Payer: Self-pay | Admitting: Hematology & Oncology

## 2019-11-08 ENCOUNTER — Other Ambulatory Visit: Payer: Self-pay

## 2019-11-08 DIAGNOSIS — C9000 Multiple myeloma not having achieved remission: Secondary | ICD-10-CM

## 2019-11-08 DIAGNOSIS — Z5112 Encounter for antineoplastic immunotherapy: Secondary | ICD-10-CM | POA: Insufficient documentation

## 2019-11-08 DIAGNOSIS — C9002 Multiple myeloma in relapse: Secondary | ICD-10-CM | POA: Diagnosis present

## 2019-11-08 LAB — CMP (CANCER CENTER ONLY)
ALT: 30 U/L (ref 0–44)
AST: 33 U/L (ref 15–41)
Albumin: 3.8 g/dL (ref 3.5–5.0)
Alkaline Phosphatase: 46 U/L (ref 38–126)
Anion gap: 7 (ref 5–15)
BUN: 21 mg/dL (ref 8–23)
CO2: 25 mmol/L (ref 22–32)
Calcium: 8.9 mg/dL (ref 8.9–10.3)
Chloride: 106 mmol/L (ref 98–111)
Creatinine: 1.14 mg/dL (ref 0.61–1.24)
GFR, Est AFR Am: >60 mL/min (ref >60)
GFR, Estimated: >60 mL/min (ref >60)
Glucose, Bld: 208 mg/dL — ABNORMAL HIGH (ref 70–99)
Potassium: 4 mmol/L (ref 3.5–5.1)
Sodium: 138 mmol/L (ref 135–145)
Total Bilirubin: 1 mg/dL (ref 0.3–1.2)
Total Protein: 5.8 g/dL — ABNORMAL LOW (ref 6.5–8.1)

## 2019-11-08 LAB — CBC WITH DIFFERENTIAL (CANCER CENTER ONLY)
Abs Immature Granulocytes: 0 10*3/uL (ref 0.00–0.07)
Basophils Absolute: 0 10*3/uL (ref 0.0–0.1)
Basophils Relative: 0 %
Eosinophils Absolute: 0.1 10*3/uL (ref 0.0–0.5)
Eosinophils Relative: 1 %
HCT: 47.2 % (ref 39.0–52.0)
Hemoglobin: 16 g/dL (ref 13.0–17.0)
Immature Granulocytes: 0 %
Lymphocytes Relative: 32 %
Lymphs Abs: 1.5 10*3/uL (ref 0.7–4.0)
MCH: 34.6 pg — ABNORMAL HIGH (ref 26.0–34.0)
MCHC: 33.9 g/dL (ref 30.0–36.0)
MCV: 102.2 fL — ABNORMAL HIGH (ref 80.0–100.0)
Monocytes Absolute: 0.6 10*3/uL (ref 0.1–1.0)
Monocytes Relative: 12 %
Neutro Abs: 2.6 10*3/uL (ref 1.7–7.7)
Neutrophils Relative %: 55 %
Platelet Count: 103 10*3/uL — ABNORMAL LOW (ref 150–400)
RBC: 4.62 MIL/uL (ref 4.22–5.81)
RDW: 12.7 % (ref 11.5–15.5)
WBC Count: 4.8 10*3/uL (ref 4.0–10.5)
nRBC: 0 % (ref 0.0–0.2)

## 2019-11-08 LAB — LACTATE DEHYDROGENASE: LDH: 202 U/L — ABNORMAL HIGH (ref 98–192)

## 2019-11-08 MED ORDER — PROCHLORPERAZINE MALEATE 10 MG PO TABS
ORAL_TABLET | ORAL | Status: AC
  Filled 2019-11-08: qty 1

## 2019-11-08 MED ORDER — DIPHENHYDRAMINE HCL 25 MG PO CAPS
ORAL_CAPSULE | ORAL | Status: AC
  Filled 2019-11-08: qty 2

## 2019-11-08 MED ORDER — SODIUM CHLORIDE 0.9% FLUSH
10.0000 mL | INTRAVENOUS | Status: DC | PRN
  Administered 2019-11-08: 10 mL
  Filled 2019-11-08: qty 10

## 2019-11-08 MED ORDER — ACETAMINOPHEN 325 MG PO TABS
650.0000 mg | ORAL_TABLET | Freq: Once | ORAL | Status: AC
  Administered 2019-11-08: 650 mg via ORAL

## 2019-11-08 MED ORDER — ACETAMINOPHEN 325 MG PO TABS
ORAL_TABLET | ORAL | Status: AC
  Filled 2019-11-08: qty 2

## 2019-11-08 MED ORDER — SODIUM CHLORIDE 0.9 % IV SOLN
Freq: Once | INTRAVENOUS | Status: AC
  Filled 2019-11-08: qty 250

## 2019-11-08 MED ORDER — SODIUM CHLORIDE 0.9 % IV SOLN
Freq: Once | INTRAVENOUS | Status: DC
  Filled 2019-11-08: qty 250

## 2019-11-08 MED ORDER — PROCHLORPERAZINE MALEATE 10 MG PO TABS
10.0000 mg | ORAL_TABLET | Freq: Once | ORAL | Status: AC
  Administered 2019-11-08: 10 mg via ORAL

## 2019-11-08 MED ORDER — SODIUM CHLORIDE 0.9 % IV SOLN
20.0000 mg | Freq: Once | INTRAVENOUS | Status: AC
  Administered 2019-11-08: 20 mg via INTRAVENOUS
  Filled 2019-11-08: qty 2

## 2019-11-08 MED ORDER — HEPARIN SOD (PORK) LOCK FLUSH 100 UNIT/ML IV SOLN
500.0000 [IU] | Freq: Once | INTRAVENOUS | Status: AC | PRN
  Administered 2019-11-08: 500 [IU]
  Filled 2019-11-08: qty 5

## 2019-11-08 MED ORDER — DIPHENHYDRAMINE HCL 25 MG PO CAPS
50.0000 mg | ORAL_CAPSULE | Freq: Once | ORAL | Status: AC
  Administered 2019-11-08: 50 mg via ORAL

## 2019-11-08 MED ORDER — DEXTROSE 5 % IV SOLN
70.0000 mg/m2 | Freq: Once | INTRAVENOUS | Status: AC
  Administered 2019-11-08: 150 mg via INTRAVENOUS
  Filled 2019-11-08: qty 60

## 2019-11-08 NOTE — Patient Instructions (Signed)
Carfilzomib injection What is this medicine? CARFILZOMIB (kar FILZ oh mib) targets a specific protein within cancer cells and stops the cancer cells from growing. It is used to treat multiple myeloma. This medicine may be used for other purposes; ask your health care provider or pharmacist if you have questions. COMMON BRAND NAME(S): KYPROLIS What should I tell my health care provider before I take this medicine? They need to know if you have any of these conditions:  heart disease  history of blood clots  irregular heartbeat  kidney disease  liver disease  lung or breathing disease  an unusual or allergic reaction to carfilzomib, or other medicines, foods, dyes, or preservatives  pregnant or trying to get pregnant  breast-feeding How should I use this medicine? This medicine is for injection or infusion into a vein. It is given by a health care professional in a hospital or clinic setting. Talk to your pediatrician regarding the use of this medicine in children. Special care may be needed. Overdosage: If you think you have taken too much of this medicine contact a poison control center or emergency room at once. NOTE: This medicine is only for you. Do not share this medicine with others. What if I miss a dose? It is important not to miss your dose. Call your doctor or health care professional if you are unable to keep an appointment. What may interact with this medicine? Interactions are not expected. Give your health care provider a list of all the medicines, herbs, non-prescription drugs, or dietary supplements you use. Also tell them if you smoke, drink alcohol, or use illegal drugs. Some items may interact with your medicine. This list may not describe all possible interactions. Give your health care provider a list of all the medicines, herbs, non-prescription drugs, or dietary supplements you use. Also tell them if you smoke, drink alcohol, or use illegal drugs. Some items  may interact with your medicine. What should I watch for while using this medicine? Your condition will be monitored carefully while you are receiving this medicine. Report any side effects. Continue your course of treatment even though you feel ill unless your doctor tells you to stop. You may need blood work done while you are taking this medicine. Do not become pregnant while taking this medicine or for at least 6 months after stopping it. Women should inform their doctor if they wish to become pregnant or think they might be pregnant. There is a potential for serious side effects to an unborn child. Men should not father a child while taking this medicine and for at least 3 months after stopping it. Talk to your health care professional or pharmacist for more information. Do not breast-feed an infant while taking this medicine or for 2 weeks after the last dose. Check with your doctor or health care professional if you get an attack of severe diarrhea, nausea and vomiting, or if you sweat a lot. The loss of too much body fluid can make it dangerous for you to take this medicine. You may get dizzy. Do not drive, use machinery, or do anything that needs mental alertness until you know how this medicine affects you. Do not stand or sit up quickly, especially if you are an older patient. This reduces the risk of dizzy or fainting spells. What side effects may I notice from receiving this medicine? Side effects that you should report to your doctor or health care professional as soon as possible:  allergic reactions like skin  rash, itching or hives, swelling of the face, lips, or tongue  confusion  dizziness  feeling faint or lightheaded  fever or chills  palpitations  seizures  signs and symptoms of bleeding such as bloody or black, tarry stools; red or dark-brown urine; spitting up blood or brown material that looks like coffee grounds; red spots on the skin; unusual bruising or bleeding  including from the eye, gums, or nose  signs and symptoms of a blood clot such as breathing problems; changes in vision; chest pain; severe, sudden headache; pain, swelling, warmth in the leg; trouble speaking; sudden numbness or weakness of the face, arm or leg  signs and symptoms of kidney injury like trouble passing urine or change in the amount of urine  signs and symptoms of liver injury like dark yellow or brown urine; general ill feeling or flu-like symptoms; light-colored stools; loss of appetite; nausea; right upper belly pain; unusually weak or tired; yellowing of the eyes or skin Side effects that usually do not require medical attention (report to your doctor or health care professional if they continue or are bothersome):  back pain  cough  diarrhea  headache  muscle cramps  trouble sleeping  vomiting This list may not describe all possible side effects. Call your doctor for medical advice about side effects. You may report side effects to FDA at 1-800-FDA-1088. Where should I keep my medicine? This drug is given in a hospital or clinic and will not be stored at home. NOTE: This sheet is a summary. It may not cover all possible information. If you have questions about this medicine, talk to your doctor, pharmacist, or health care provider.  2020 Elsevier/Gold Standard (2019-01-18 19:44:21)  

## 2019-11-08 NOTE — Progress Notes (Signed)
Hematology and Oncology Follow Up Visit  Rickey [name] [id number] [birth year] 79 y.o. 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 Elotuzumab/Pomalidomide/decadron -every 4 weeks- start 01/12/2018 Kyprolis/Pomalidomide/Decadron -- s/p cycle #9 -- d/c pomaldomide in 01/2019 IVIG 40 mg IV q 6 weeks   Interim History:  Rickey [name] [name] back for follow-up.  He [name] doing quite well.  [place]  A lot of tourists are up there.  He has a nice garden.  He says he actually says he has 50 blueberry bushes.  I told him to send me a picture of these when I see him back.  He has had no problems with his diabetes.  He [name] on a pump.  There [name] been no problems with nausea or vomiting.  He has had no cough or shortness of breath.  He has had no change in bowel or bladder habits.  His last kappa light chain was 2.9 mg/dL when we saw him back in May.    Overall, I would say his performance status [name] ECOG 1.       Medications:  Allergies as of 11/08/2019   No Known Allergies     Medication List       Accurate as of November 08, 2019 11:14 AM. If you have any questions, ask your nurse or doctor.        acyclovir 400 MG tablet Commonly known as: ZOVIRAX TAKE 1 TABLET BY MOUTH TWICE DAILY   amLODipine 2.5 MG tablet Commonly known as: NORVASC   aspirin 81 MG tablet Take 81 mg by mouth daily.   atorvastatin 10 MG tablet Commonly known as: LIPITOR Take 10 mg by mouth daily.   calcium-vitamin D 250-125 MG-UNIT tablet Commonly known as: OSCAL Take 1 tablet by mouth daily.   Cartridge Pump Misc Inject into the skin.   cycloSPORINE 0.05 % ophthalmic emulsion Commonly known as: RESTASIS Place 1 drop  into both eyes 2 (two) times daily.   fish oil-omega-3 fatty acids 1000 MG capsule Take 1 g by mouth 2 (two) times daily.   imiquimod 5 % cream Commonly known as: ALDARA   INSULIN LISP & LISP PROT (HUM) [place]   insulin lispro 100 UNIT/ML injection Commonly known as: HUMALOG Inject into the skin. Insulin pump 12/21/2018 Typically 80U daily.   irbesartan-hydrochlorothiazide 300-12.5 MG tablet Commonly known as: AVALIDE Take 1 tablet by mouth daily.   lidocaine-prilocaine cream Commonly known as: EMLA Apply 1 application topically as needed.   montelukast 10 MG tablet Commonly known as: SINGULAIR Take 10 mg by mouth at bedtime.   ondansetron 4 MG disintegrating tablet Commonly known as: ZOFRAN-ODT Take 1 tablet (4 mg total) by mouth 4 (four) times daily as needed for nausea or vomiting.   ondansetron 8 MG tablet Commonly known as: ZOFRAN Take 1 tablet (8 mg total) by mouth every 8 (eight) hours as needed for nausea or vomiting.   tamsulosin 0.4 MG Caps capsule Commonly known as: FLOMAX   telmisartan-hydrochlorothiazide 80-12.5 MG tablet Commonly known as: MICARDIS HCT   Vitamin D 50 MCG (2000 UT) Caps Take by mouth every morning.   zolpidem 10 MG tablet Commonly known as: AMBIEN  Allergies: No Known Allergies  Past Medical History, Surgical history, Social history, and Family History were reviewed and updated.  Review of Systems: Review of Systems  Constitutional: Negative.   HENT: Negative.   Eyes: Negative.   Respiratory: Negative.   Cardiovascular: Negative.   Gastrointestinal: Negative.   Genitourinary: Negative.   Musculoskeletal: Negative.   Skin: Negative.   Neurological: Negative.   Endo/Heme/Allergies: Negative.   Psychiatric/Behavioral: Negative.      Physical Exam:  vitals were not taken for this visit.   Wt Readings from Last 3 Encounters:  11/08/19 209 lb 1.9 oz (94.9 kg)  10/18/19 209 lb (94.8 kg)  04/26/19 205 lb (93 kg)     Physical Exam Vitals reviewed.  HENT:     Head: Normocephalic and atraumatic.  Eyes:     Pupils: Pupils are equal, round, and reactive to light.  Cardiovascular:     Rate and Rhythm: Normal rate and regular rhythm.     Heart sounds: Normal heart sounds.  Pulmonary:     Effort: Pulmonary effort [name] normal.     Breath sounds: Normal breath sounds.  Abdominal:     General: Bowel sounds are normal.     Palpations: Abdomen [name] soft.  Musculoskeletal:        General: No tenderness or deformity. Normal range of motion.     Cervical back: Normal range of motion.  Lymphadenopathy:     Cervical: No cervical adenopathy.  Skin:    General: Skin [name] warm and dry.     Findings: No erythema or rash.  Neurological:     Mental Status: He [name] alert and oriented to person, place, and time.  Psychiatric:        Behavior: Behavior normal.        Thought Content: Thought content normal.        Judgment: Judgment normal.      Lab Results  Component Value Date   WBC 4.8 11/08/2019   HGB 16.0 11/08/2019   HCT 47.2 11/08/2019   MCV 102.2 (H) 11/08/2019   PLT 103 (L) 11/08/2019   Lab Results  Component Value Date   FERRITIN 258 12/28/2018   IRON 98 12/28/2018   TIBC 253 12/28/2018   UIBC 154 12/28/2018   IRONPCTSAT 39 12/28/2018   Lab Results  Component Value Date   RBC 4.62 11/08/2019   Lab Results  Component Value Date   KPAFRELGTCHN 29.4 (H) 10/18/2019   LAMBDASER <1.5 (L) 10/18/2019   KAPLAMBRATIO >19.60 (H) 10/18/2019   Lab Results  Component Value Date   IGGSERUM 580 (L) 10/18/2019   IGA 10 (L) 10/18/2019   IGMSERUM <5 (L) 10/18/2019   Lab Results  Component Value Date   TOTALPROTELP 5.2 (L) 10/18/2019   ALBUMINELP 3.0 10/18/2019   A1GS 0.2 10/18/2019   A2GS 0.6 10/18/2019   BETS 0.7 10/18/2019   BETA2SER 0.3 04/19/2015   GAMS 0.6 10/18/2019   MSPIKE Not Observed 10/18/2019   SPEI Comment 10/18/2019     Chemistry      Component Value Date/Time   NA 138  11/08/2019 1007   NA 142 04/23/2017 1431   NA 139 07/18/2016 1135   K 4.0 11/08/2019 1007   K 4.0 04/23/2017 1431   K 4.4 07/18/2016 1135   CL 106 11/08/2019 1007   CL 105 04/23/2017 1431   CO2 25 11/08/2019 1007   CO2 28 04/23/2017 1431   CO2 27 07/18/2016 1135   BUN 21 11/08/2019 1007  BUN 20 04/23/2017 1431   BUN 12.2 07/18/2016 1135   CREATININE 1.14 11/08/2019 1007   CREATININE 1.2 04/23/2017 1431   CREATININE 0.9 07/18/2016 1135      Component Value Date/Time   CALCIUM 8.9 11/08/2019 1007   CALCIUM 9.3 04/23/2017 1431   CALCIUM 8.9 07/18/2016 1135   ALKPHOS 46 11/08/2019 1007   ALKPHOS 64 04/23/2017 1431   ALKPHOS 58 07/18/2016 1135   AST 33 11/08/2019 1007   AST 15 07/18/2016 1135   ALT 30 11/08/2019 1007   ALT 22 04/23/2017 1431   ALT 20 07/18/2016 1135   BILITOT 1.0 11/08/2019 1007   BILITOT 1.24 (H) 07/18/2016 1135      Impression and Plan: Rickey [name] a very pleasant 79 yo caucasian gentleman with 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   I am just so happy that he [name] done nicely.  We will have to see what his myeloma levels are this visit.  At some point, we probably should check a echocardiogram on him.  I think this would be reasonable given that he has been on Kyprolis for quite a while.  He will just get Kyprolis today.  The next time that we we see him, we will do IVIG along with Kyprolis.Marland Kitchen  [name], MD 6/14/202111:14 AM

## 2019-11-09 LAB — KAPPA/LAMBDA LIGHT CHAINS
Kappa free light chain: 32.8 mg/L — ABNORMAL HIGH (ref 3.3–19.4)
Kappa, lambda light chain ratio: 15.62 — ABNORMAL HIGH (ref 0.26–1.65)
Lambda free light chains: 2.1 mg/L — ABNORMAL LOW (ref 5.7–26.3)

## 2019-11-09 LAB — IGG, IGA, IGM
IgA: 12 mg/dL — ABNORMAL LOW (ref 61–437)
IgG (Immunoglobin G), Serum: 801 mg/dL (ref 603–1613)
IgM (Immunoglobulin M), Srm: <5 mg/dL — ABNORMAL LOW (ref 15–143)

## 2019-11-10 LAB — PROTEIN ELECTROPHORESIS, SERUM, WITH REFLEX
A/G Ratio: 1.4 (ref 0.7–1.7)
Albumin ELP: 3.4 g/dL (ref 2.9–4.4)
Alpha-1-Globulin: 0.2 g/dL (ref 0.0–0.4)
Alpha-2-Globulin: 0.7 g/dL (ref 0.4–1.0)
Beta Globulin: 0.8 g/dL (ref 0.7–1.3)
Gamma Globulin: 0.8 g/dL (ref 0.4–1.8)
Globulin, Total: 2.5 g/dL (ref 2.2–3.9)
Total Protein ELP: 5.9 g/dL — ABNORMAL LOW (ref 6.0–8.5)

## 2019-11-15 ENCOUNTER — Ambulatory Visit: Payer: Medicare Other

## 2019-11-15 ENCOUNTER — Other Ambulatory Visit: Payer: Medicare Other

## 2019-11-15 ENCOUNTER — Ambulatory Visit: Payer: Medicare Other | Admitting: Hematology & Oncology

## 2019-11-17 DIAGNOSIS — H02831 Dermatochalasis of right upper eyelid: Secondary | ICD-10-CM | POA: Diagnosis not present

## 2019-11-17 DIAGNOSIS — H02834 Dermatochalasis of left upper eyelid: Secondary | ICD-10-CM | POA: Diagnosis not present

## 2019-11-17 DIAGNOSIS — E109 Type 1 diabetes mellitus without complications: Secondary | ICD-10-CM | POA: Diagnosis not present

## 2019-11-30 ENCOUNTER — Other Ambulatory Visit: Payer: Self-pay

## 2019-11-30 ENCOUNTER — Encounter: Payer: Self-pay | Admitting: Hematology & Oncology

## 2019-11-30 ENCOUNTER — Inpatient Hospital Stay: Payer: Medicare Other

## 2019-11-30 ENCOUNTER — Other Ambulatory Visit: Payer: Self-pay | Admitting: *Deleted

## 2019-11-30 ENCOUNTER — Inpatient Hospital Stay: Payer: Medicare Other | Attending: Hematology & Oncology | Admitting: Hematology & Oncology

## 2019-11-30 VITALS — BP 133/75 | HR 68 | Resp 18

## 2019-11-30 VITALS — BP 133/75 | HR 68 | Temp 97.7°F | Resp 18 | Wt 207.0 lb

## 2019-11-30 DIAGNOSIS — C9000 Multiple myeloma not having achieved remission: Secondary | ICD-10-CM

## 2019-11-30 DIAGNOSIS — Z5112 Encounter for antineoplastic immunotherapy: Secondary | ICD-10-CM | POA: Insufficient documentation

## 2019-11-30 DIAGNOSIS — D801 Nonfamilial hypogammaglobulinemia: Secondary | ICD-10-CM

## 2019-11-30 DIAGNOSIS — E119 Type 2 diabetes mellitus without complications: Secondary | ICD-10-CM | POA: Insufficient documentation

## 2019-11-30 DIAGNOSIS — C9002 Multiple myeloma in relapse: Secondary | ICD-10-CM | POA: Insufficient documentation

## 2019-11-30 DIAGNOSIS — Z95828 Presence of other vascular implants and grafts: Secondary | ICD-10-CM

## 2019-11-30 LAB — CBC WITH DIFFERENTIAL (CANCER CENTER ONLY)
Abs Immature Granulocytes: 0.01 10*3/uL (ref 0.00–0.07)
Basophils Absolute: 0 10*3/uL (ref 0.0–0.1)
Basophils Relative: 1 %
Eosinophils Absolute: 0.1 10*3/uL (ref 0.0–0.5)
Eosinophils Relative: 2 %
HCT: 47.5 % (ref 39.0–52.0)
Hemoglobin: 16 g/dL (ref 13.0–17.0)
Immature Granulocytes: 0 %
Lymphocytes Relative: 32 %
Lymphs Abs: 1.4 10*3/uL (ref 0.7–4.0)
MCH: 34.6 pg — ABNORMAL HIGH (ref 26.0–34.0)
MCHC: 33.7 g/dL (ref 30.0–36.0)
MCV: 102.8 fL — ABNORMAL HIGH (ref 80.0–100.0)
Monocytes Absolute: 0.6 10*3/uL (ref 0.1–1.0)
Monocytes Relative: 15 %
Neutro Abs: 2.2 10*3/uL (ref 1.7–7.7)
Neutrophils Relative %: 50 %
Platelet Count: 103 10*3/uL — ABNORMAL LOW (ref 150–400)
RBC: 4.62 MIL/uL (ref 4.22–5.81)
RDW: 12.7 % (ref 11.5–15.5)
WBC Count: 4.3 10*3/uL (ref 4.0–10.5)
nRBC: 0 % (ref 0.0–0.2)

## 2019-11-30 LAB — CMP (CANCER CENTER ONLY)
ALT: 31 U/L (ref 0–44)
AST: 30 U/L (ref 15–41)
Albumin: 3.9 g/dL (ref 3.5–5.0)
Alkaline Phosphatase: 53 U/L (ref 38–126)
Anion gap: 7 (ref 5–15)
BUN: 19 mg/dL (ref 8–23)
CO2: 26 mmol/L (ref 22–32)
Calcium: 8.9 mg/dL (ref 8.9–10.3)
Chloride: 106 mmol/L (ref 98–111)
Creatinine: 0.99 mg/dL (ref 0.61–1.24)
GFR, Est AFR Am: >60 mL/min (ref >60)
GFR, Estimated: >60 mL/min (ref >60)
Glucose, Bld: 170 mg/dL — ABNORMAL HIGH (ref 70–99)
Potassium: 4 mmol/L (ref 3.5–5.1)
Sodium: 139 mmol/L (ref 135–145)
Total Bilirubin: 1.1 mg/dL (ref 0.3–1.2)
Total Protein: 5.8 g/dL — ABNORMAL LOW (ref 6.5–8.1)

## 2019-11-30 LAB — CEA (IN HOUSE-CHCC): CEA (CHCC-In House): 2.2 ng/mL (ref 0.00–5.00)

## 2019-11-30 MED ORDER — PROCHLORPERAZINE MALEATE 10 MG PO TABS
10.0000 mg | ORAL_TABLET | Freq: Once | ORAL | Status: AC
  Administered 2019-11-30: 10 mg via ORAL

## 2019-11-30 MED ORDER — ONDANSETRON HCL 8 MG PO TABS
ORAL_TABLET | ORAL | Status: AC
  Filled 2019-11-30: qty 1

## 2019-11-30 MED ORDER — ONDANSETRON HCL 8 MG PO TABS: 8.0000 mg | ORAL_TABLET | Freq: Three times a day (TID) | ORAL | 3 refills | Status: DC | PRN

## 2019-11-30 MED ORDER — SODIUM CHLORIDE 0.9 % IV SOLN
Freq: Once | INTRAVENOUS | Status: AC
  Filled 2019-11-30: qty 250

## 2019-11-30 MED ORDER — SODIUM CHLORIDE 0.9 % IV SOLN
20.0000 mg | Freq: Once | INTRAVENOUS | Status: AC
  Administered 2019-11-30: 20 mg via INTRAVENOUS
  Filled 2019-11-30: qty 2

## 2019-11-30 MED ORDER — HEPARIN SOD (PORK) LOCK FLUSH 100 UNIT/ML IV SOLN
500.0000 [IU] | Freq: Once | INTRAVENOUS | Status: DC
  Filled 2019-11-30: qty 5

## 2019-11-30 MED ORDER — HEPARIN SOD (PORK) LOCK FLUSH 100 UNIT/ML IV SOLN
500.0000 [IU] | Freq: Once | INTRAVENOUS | Status: AC | PRN
  Administered 2019-11-30: 500 [IU]
  Filled 2019-11-30: qty 5

## 2019-11-30 MED ORDER — ONDANSETRON HCL 8 MG PO TABS
8.0000 mg | ORAL_TABLET | Freq: Once | ORAL | Status: AC
  Administered 2019-11-30: 8 mg via ORAL

## 2019-11-30 MED ORDER — DEXTROSE 5 % IV SOLN
70.0000 mg/m2 | Freq: Once | INTRAVENOUS | Status: AC
  Administered 2019-11-30: 150 mg via INTRAVENOUS
  Filled 2019-11-30: qty 60

## 2019-11-30 MED ORDER — SODIUM CHLORIDE 0.9% FLUSH
10.0000 mL | Freq: Once | INTRAVENOUS | Status: AC
  Administered 2019-11-30: 10 mL via INTRAVENOUS
  Filled 2019-11-30: qty 10

## 2019-11-30 MED ORDER — IMMUNE GLOBULIN (HUMAN) 20 GM/200ML IV SOLN
40.0000 g | Freq: Once | INTRAVENOUS | Status: AC
  Administered 2019-11-30: 40 g via INTRAVENOUS
  Filled 2019-11-30: qty 400

## 2019-11-30 MED ORDER — ACETAMINOPHEN 325 MG PO TABS
650.0000 mg | ORAL_TABLET | Freq: Once | ORAL | Status: AC
  Administered 2019-11-30: 650 mg via ORAL

## 2019-11-30 MED ORDER — PROCHLORPERAZINE MALEATE 10 MG PO TABS
ORAL_TABLET | ORAL | Status: AC
  Filled 2019-11-30: qty 1

## 2019-11-30 MED ORDER — DEXTROSE 5 % IV SOLN
INTRAVENOUS | Status: DC
  Filled 2019-11-30 (×2): qty 250

## 2019-11-30 MED ORDER — DIPHENHYDRAMINE HCL 25 MG PO CAPS
ORAL_CAPSULE | ORAL | Status: AC
  Filled 2019-11-30: qty 2

## 2019-11-30 MED ORDER — ACETAMINOPHEN 325 MG PO TABS
ORAL_TABLET | ORAL | Status: AC
  Filled 2019-11-30: qty 2

## 2019-11-30 MED ORDER — DIPHENHYDRAMINE HCL 25 MG PO CAPS
50.0000 mg | ORAL_CAPSULE | Freq: Once | ORAL | Status: AC
  Administered 2019-11-30: 50 mg via ORAL

## 2019-11-30 MED ORDER — SODIUM CHLORIDE 0.9% FLUSH
10.0000 mL | INTRAVENOUS | Status: DC | PRN
  Administered 2019-11-30: 10 mL
  Filled 2019-11-30: qty 10

## 2019-11-30 NOTE — Progress Notes (Signed)
Hematology and Oncology Follow Up Visit  Rickey [name] [id number] 07-Apr-1941 79 y.o. 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 Elotuzumab/Pomalidomide/decadron -every 4 weeks- start 01/12/2018 Kyprolis/Pomalidomide/Decadron -- s/p cycle #9 -- d/c pomaldomide in 01/2019 IVIG 40 mg IV q 6 weeks   Interim History:  Rickey [name] is back for follow-up.  He is doing quite well. He had a very nice July 4 weekend. He and his wife are down at the daughter's house.  [place] He showed me pictures of his garden. It is very impressive.  His blood sugars have been doing quite well with his insulin pump.  I noted that his light chains have been going up slowly. More last saw him back in June, the kappa light chain was 3.3 mg/dL. I think we see that they keep trending upward, we may have to make a change with his treatment. I probably would consider Selinexor for him.  He has had no problems with bowels or bladder. He has had no cough. He has had no fever. There has been no rashes. He has had no leg swelling. There is been no nausea or vomiting. He is eating well.  Overall, I would say his performance status is ECOG 1.       Medications:  Allergies as of 11/30/2019   No Known Allergies     Medication List       Accurate as of November 30, 2019 10:08 AM. If you have any questions, ask your nurse or doctor.        acyclovir 400 MG tablet Commonly known as: ZOVIRAX TAKE 1 TABLET BY MOUTH TWICE DAILY   amLODipine 2.5 MG tablet Commonly known as: NORVASC   aspirin 81 MG tablet Take 81 mg by mouth daily.   atorvastatin 10 MG tablet Commonly known as: LIPITOR Take 10 mg by mouth daily.   CALCIUM CARBONATE  PO Take 220 mg by mouth 2 (two) times daily.   calcium-vitamin D 250-125 MG-UNIT tablet Commonly known as: OSCAL Take 1 tablet by mouth daily.   carfilzomib 30 MG Solr Infuse into the vein once every 3 weeks.   Cartridge Pump Misc Inject into the skin.   cycloSPORINE 0.05 % ophthalmic emulsion Commonly known as: RESTASIS Place 1 drop into both eyes 2 (two) times daily.   fish oil-omega-3 fatty acids 1000 MG capsule Take 1 g by mouth 2 (two) times daily.   imiquimod 5 % cream Commonly known as: ALDARA   INSULIN LISP & LISP PROT (HUM) [place]   insulin lispro 100 UNIT/ML injection Commonly known as: HUMALOG Inject into the skin. Insulin pump 12/21/2018 Typically 80U daily.   irbesartan-hydrochlorothiazide 300-12.5 MG tablet Commonly known as: AVALIDE Take 1 tablet by mouth daily.   lidocaine-prilocaine cream Commonly known as: EMLA Apply 1 application topically as needed.   montelukast 10 MG tablet Commonly known as: SINGULAIR Take 10 mg by mouth at bedtime.   ondansetron 4 MG disintegrating tablet Commonly known as: ZOFRAN-ODT Take 1 tablet (4 mg total) by mouth 4 (four) times daily as needed for nausea or vomiting.   ondansetron 8 MG tablet Commonly known as: ZOFRAN Take 1 tablet (8 mg total) by mouth every 8 (eight) hours  as needed for nausea or vomiting.   tamsulosin 0.4 MG Caps capsule Commonly known as: FLOMAX   telmisartan-hydrochlorothiazide 80-12.5 MG tablet Commonly known as: MICARDIS HCT   Vitamin D 50 MCG (2000 UT) Caps Take by mouth every morning.   zolpidem 10 MG tablet Commonly known as: AMBIEN       Allergies: No Known Allergies  Past Medical History, Surgical history, Social history, and Family History were reviewed and updated.  Review of Systems: Review of Systems  Constitutional: Negative.   HENT: Negative.   Eyes: Negative.   Respiratory: Negative.   Cardiovascular: Negative.   Gastrointestinal: Negative.   Genitourinary:  Negative.   Musculoskeletal: Negative.   Skin: Negative.   Neurological: Negative.   Endo/Heme/Allergies: Negative.   Psychiatric/Behavioral: Negative.      Physical Exam:  weight is 207 lb (93.9 kg). His oral temperature is 97.7 F (36.5 C). His blood pressure is 133/75 and his pulse is 68. His respiration is 18.   Wt Readings from Last 3 Encounters:  11/30/19 207 lb (93.9 kg)  11/08/19 209 lb 1.9 oz (94.9 kg)  10/18/19 209 lb (94.8 kg)    Physical Exam Vitals reviewed.  HENT:     Head: Normocephalic and atraumatic.  Eyes:     Pupils: Pupils are equal, round, and reactive to light.  Cardiovascular:     Rate and Rhythm: Normal rate and regular rhythm.     Heart sounds: Normal heart sounds.  Pulmonary:     Effort: Pulmonary effort is normal.     Breath sounds: Normal breath sounds.  Abdominal:     General: Bowel sounds are normal.     Palpations: Abdomen is soft.  Musculoskeletal:        General: No tenderness or deformity. Normal range of motion.     Cervical back: Normal range of motion.  Lymphadenopathy:     Cervical: No cervical adenopathy.  Skin:    General: Skin is warm and dry.     Findings: No erythema or rash.  Neurological:     Mental Status: He is alert and oriented to person, place, and time.  Psychiatric:        Behavior: Behavior normal.        Thought Content: Thought content normal.        Judgment: Judgment normal.      Lab Results  Component Value Date   WBC 4.3 11/30/2019   HGB 16.0 11/30/2019   HCT 47.5 11/30/2019   MCV 102.8 (H) 11/30/2019   PLT 103 (L) 11/30/2019   Lab Results  Component Value Date   FERRITIN 258 12/28/2018   IRON 98 12/28/2018   TIBC 253 12/28/2018   UIBC 154 12/28/2018   IRONPCTSAT 39 12/28/2018   Lab Results  Component Value Date   RBC 4.62 11/30/2019   Lab Results  Component Value Date   KPAFRELGTCHN 32.8 (H) 11/08/2019   LAMBDASER 2.1 (L) 11/08/2019   KAPLAMBRATIO 15.62 (H) 11/08/2019   Lab  Results  Component Value Date   IGGSERUM 801 11/08/2019   IGA 12 (L) 11/08/2019   IGMSERUM <5 (L) 11/08/2019   Lab Results  Component Value Date   TOTALPROTELP 5.9 (L) 11/08/2019   ALBUMINELP 3.4 11/08/2019   A1GS 0.2 11/08/2019   A2GS 0.7 11/08/2019   BETS 0.8 11/08/2019   BETA2SER 0.3 04/19/2015   GAMS 0.8 11/08/2019   MSPIKE Not Observed 11/08/2019   SPEI Comment 10/18/2019     Chemistry  Component Value Date/Time   NA 138 11/08/2019 1007   NA 142 04/23/2017 1431   NA 139 07/18/2016 1135   K 4.0 11/08/2019 1007   K 4.0 04/23/2017 1431   K 4.4 07/18/2016 1135   CL 106 11/08/2019 1007   CL 105 04/23/2017 1431   CO2 25 11/08/2019 1007   CO2 28 04/23/2017 1431   CO2 27 07/18/2016 1135   BUN 21 11/08/2019 1007   BUN 20 04/23/2017 1431   BUN 12.2 07/18/2016 1135   CREATININE 1.14 11/08/2019 1007   CREATININE 1.2 04/23/2017 1431   CREATININE 0.9 07/18/2016 1135      Component Value Date/Time   CALCIUM 8.9 11/08/2019 1007   CALCIUM 9.3 04/23/2017 1431   CALCIUM 8.9 07/18/2016 1135   ALKPHOS 46 11/08/2019 1007   ALKPHOS 64 04/23/2017 1431   ALKPHOS 58 07/18/2016 1135   AST 33 11/08/2019 1007   AST 15 07/18/2016 1135   ALT 30 11/08/2019 1007   ALT 22 04/23/2017 1431   ALT 20 07/18/2016 1135   BILITOT 1.0 11/08/2019 1007   BILITOT 1.24 (H) 07/18/2016 1135      Impression and Plan: Mr. 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   I am just a little worried regarding the rise in his kappa light chain.  He will get his IVIG today.   We will plan for another follow-up in 3 weeks.  [name], MD 7/6/202110:08 AM

## 2019-11-30 NOTE — Patient Instructions (Signed)
Carfilzomib injection What is this medicine? CARFILZOMIB (kar FILZ oh mib) targets a specific protein within cancer cells and stops the cancer cells from growing. It is used to treat multiple myeloma. This medicine may be used for other purposes; ask your health care provider or pharmacist if you have questions. COMMON BRAND NAME(S): KYPROLIS What should I tell my health care provider before I take this medicine? They need to know if you have any of these conditions:  heart disease  history of blood clots  irregular heartbeat  kidney disease  liver disease  lung or breathing disease  an unusual or allergic reaction to carfilzomib, or other medicines, foods, dyes, or preservatives  pregnant or trying to get pregnant  breast-feeding How should I use this medicine? This medicine is for injection or infusion into a vein. It is given by a health care professional in a hospital or clinic setting. Talk to your pediatrician regarding the use of this medicine in children. Special care may be needed. Overdosage: If you think you have taken too much of this medicine contact a poison control center or emergency room at once. NOTE: This medicine is only for you. Do not share this medicine with others. What if I miss a dose? It is important not to miss your dose. Call your doctor or health care professional if you are unable to keep an appointment. What may interact with this medicine? Interactions are not expected. Give your health care provider a list of all the medicines, herbs, non-prescription drugs, or dietary supplements you use. Also tell them if you smoke, drink alcohol, or use illegal drugs. Some items may interact with your medicine. This list may not describe all possible interactions. Give your health care provider a list of all the medicines, herbs, non-prescription drugs, or dietary supplements you use. Also tell them if you smoke, drink alcohol, or use illegal drugs. Some items  may interact with your medicine. What should I watch for while using this medicine? Your condition will be monitored carefully while you are receiving this medicine. Report any side effects. Continue your course of treatment even though you feel ill unless your doctor tells you to stop. You may need blood work done while you are taking this medicine. Do not become pregnant while taking this medicine or for at least 6 months after stopping it. Women should inform their doctor if they wish to become pregnant or think they might be pregnant. There is a potential for serious side effects to an unborn child. Men should not father a child while taking this medicine and for at least 3 months after stopping it. Talk to your health care professional or pharmacist for more information. Do not breast-feed an infant while taking this medicine or for 2 weeks after the last dose. Check with your doctor or health care professional if you get an attack of severe diarrhea, nausea and vomiting, or if you sweat a lot. The loss of too much body fluid can make it dangerous for you to take this medicine. You may get dizzy. Do not drive, use machinery, or do anything that needs mental alertness until you know how this medicine affects you. Do not stand or sit up quickly, especially if you are an older patient. This reduces the risk of dizzy or fainting spells. What side effects may I notice from receiving this medicine? Side effects that you should report to your doctor or health care professional as soon as possible:  allergic reactions like skin  rash, itching or hives, swelling of the face, lips, or tongue  confusion  dizziness  feeling faint or lightheaded  fever or chills  palpitations  seizures  signs and symptoms of bleeding such as bloody or black, tarry stools; red or dark-brown urine; spitting up blood or brown material that looks like coffee grounds; red spots on the skin; unusual bruising or bleeding  including from the eye, gums, or nose  signs and symptoms of a blood clot such as breathing problems; changes in vision; chest pain; severe, sudden headache; pain, swelling, warmth in the leg; trouble speaking; sudden numbness or weakness of the face, arm or leg  signs and symptoms of kidney injury like trouble passing urine or change in the amount of urine  signs and symptoms of liver injury like dark yellow or brown urine; general ill feeling or flu-like symptoms; light-colored stools; loss of appetite; nausea; right upper belly pain; unusually weak or tired; yellowing of the eyes or skin Side effects that usually do not require medical attention (report to your doctor or health care professional if they continue or are bothersome):  back pain  cough  diarrhea  headache  muscle cramps  trouble sleeping  vomiting This list may not describe all possible side effects. Call your doctor for medical advice about side effects. You may report side effects to FDA at 1-800-FDA-1088. Where should I keep my medicine? This drug is given in a hospital or clinic and will not be stored at home. NOTE: This sheet is a summary. It may not cover all possible information. If you have questions about this medicine, talk to your doctor, pharmacist, or health care provider.  2020 Elsevier/Gold Standard (2019-01-18 19:44:21)

## 2019-12-01 LAB — PROTEIN ELECTROPHORESIS, SERUM, WITH REFLEX
A/G Ratio: 1.6 (ref 0.7–1.7)
Albumin ELP: 3.5 g/dL (ref 2.9–4.4)
Alpha-1-Globulin: 0.2 g/dL (ref 0.0–0.4)
Alpha-2-Globulin: 0.6 g/dL (ref 0.4–1.0)
Beta Globulin: 0.9 g/dL (ref 0.7–1.3)
Gamma Globulin: 0.5 g/dL (ref 0.4–1.8)
Globulin, Total: 2.2 g/dL (ref 2.2–3.9)
Total Protein ELP: 5.7 g/dL — ABNORMAL LOW (ref 6.0–8.5)

## 2019-12-01 LAB — KAPPA/LAMBDA LIGHT CHAINS
Kappa free light chain: 38.2 mg/L — ABNORMAL HIGH (ref 3.3–19.4)
Kappa, lambda light chain ratio: 23.88 — ABNORMAL HIGH (ref 0.26–1.65)
Lambda free light chains: 1.6 mg/L — ABNORMAL LOW (ref 5.7–26.3)

## 2019-12-01 LAB — IGG, IGA, IGM
IgA: 11 mg/dL — ABNORMAL LOW (ref 61–437)
IgG (Immunoglobin G), Serum: 625 mg/dL (ref 603–1613)
IgM (Immunoglobulin M), Srm: 7 mg/dL — ABNORMAL LOW (ref 15–143)

## 2019-12-07 DIAGNOSIS — C44519 Basal cell carcinoma of skin of other part of trunk: Secondary | ICD-10-CM | POA: Diagnosis not present

## 2019-12-07 DIAGNOSIS — C44719 Basal cell carcinoma of skin of left lower limb, including hip: Secondary | ICD-10-CM | POA: Diagnosis not present

## 2019-12-21 ENCOUNTER — Other Ambulatory Visit: Payer: Self-pay

## 2019-12-21 ENCOUNTER — Inpatient Hospital Stay (HOSPITAL_BASED_OUTPATIENT_CLINIC_OR_DEPARTMENT_OTHER): Payer: Medicare Other | Admitting: Hematology & Oncology

## 2019-12-21 ENCOUNTER — Inpatient Hospital Stay: Payer: Medicare Other

## 2019-12-21 VITALS — BP 130/72 | HR 74 | Temp 98.6°F | Resp 18 | Wt 208.0 lb

## 2019-12-21 DIAGNOSIS — Z5112 Encounter for antineoplastic immunotherapy: Secondary | ICD-10-CM | POA: Diagnosis not present

## 2019-12-21 DIAGNOSIS — C9000 Multiple myeloma not having achieved remission: Secondary | ICD-10-CM

## 2019-12-21 DIAGNOSIS — C9002 Multiple myeloma in relapse: Secondary | ICD-10-CM | POA: Diagnosis not present

## 2019-12-21 DIAGNOSIS — E119 Type 2 diabetes mellitus without complications: Secondary | ICD-10-CM | POA: Diagnosis not present

## 2019-12-21 LAB — CBC WITH DIFFERENTIAL (CANCER CENTER ONLY)
Abs Immature Granulocytes: 0 10*3/uL (ref 0.00–0.07)
Basophils Absolute: 0.1 10*3/uL (ref 0.0–0.1)
Basophils Relative: 2 %
Eosinophils Absolute: 0.1 10*3/uL (ref 0.0–0.5)
Eosinophils Relative: 2 %
HCT: 47.3 % (ref 39.0–52.0)
Hemoglobin: 16.2 g/dL (ref 13.0–17.0)
Immature Granulocytes: 0 %
Lymphocytes Relative: 40 %
Lymphs Abs: 1.3 10*3/uL (ref 0.7–4.0)
MCH: 35.1 pg — ABNORMAL HIGH (ref 26.0–34.0)
MCHC: 34.2 g/dL (ref 30.0–36.0)
MCV: 102.6 fL — ABNORMAL HIGH (ref 80.0–100.0)
Monocytes Absolute: 0.6 10*3/uL (ref 0.1–1.0)
Monocytes Relative: 20 %
Neutro Abs: 1.1 10*3/uL — ABNORMAL LOW (ref 1.7–7.7)
Neutrophils Relative %: 36 %
Platelet Count: 107 10*3/uL — ABNORMAL LOW (ref 150–400)
RBC: 4.61 MIL/uL (ref 4.22–5.81)
RDW: 13.3 % (ref 11.5–15.5)
WBC Count: 3.1 10*3/uL — ABNORMAL LOW (ref 4.0–10.5)
nRBC: 0 % (ref 0.0–0.2)

## 2019-12-21 LAB — CMP (CANCER CENTER ONLY)
ALT: 33 U/L (ref 0–44)
AST: 39 U/L (ref 15–41)
Albumin: 4 g/dL (ref 3.5–5.0)
Alkaline Phosphatase: 53 U/L (ref 38–126)
Anion gap: 5 (ref 5–15)
BUN: 19 mg/dL (ref 8–23)
CO2: 26 mmol/L (ref 22–32)
Calcium: 9.2 mg/dL (ref 8.9–10.3)
Chloride: 106 mmol/L (ref 98–111)
Creatinine: 1.04 mg/dL (ref 0.61–1.24)
GFR, Est AFR Am: >60 mL/min (ref >60)
GFR, Estimated: >60 mL/min (ref >60)
Glucose, Bld: 201 mg/dL — ABNORMAL HIGH (ref 70–99)
Potassium: 4.1 mmol/L (ref 3.5–5.1)
Sodium: 137 mmol/L (ref 135–145)
Total Bilirubin: 1 mg/dL (ref 0.3–1.2)
Total Protein: 6.2 g/dL — ABNORMAL LOW (ref 6.5–8.1)

## 2019-12-21 MED ORDER — ACETAMINOPHEN 325 MG PO TABS
650.0000 mg | ORAL_TABLET | Freq: Once | ORAL | Status: AC
  Administered 2019-12-21: 650 mg via ORAL

## 2019-12-21 MED ORDER — SODIUM CHLORIDE 0.9 % IV SOLN
20.0000 mg | Freq: Once | INTRAVENOUS | Status: AC
  Administered 2019-12-21: 20 mg via INTRAVENOUS
  Filled 2019-12-21: qty 20

## 2019-12-21 MED ORDER — PROCHLORPERAZINE MALEATE 10 MG PO TABS
ORAL_TABLET | ORAL | Status: AC
  Filled 2019-12-21: qty 1

## 2019-12-21 MED ORDER — HEPARIN SOD (PORK) LOCK FLUSH 100 UNIT/ML IV SOLN
500.0000 [IU] | Freq: Once | INTRAVENOUS | Status: AC | PRN
  Administered 2019-12-21: 500 [IU]
  Filled 2019-12-21: qty 5

## 2019-12-21 MED ORDER — DEXTROSE 5 % IV SOLN
70.0000 mg/m2 | Freq: Once | INTRAVENOUS | Status: AC
  Administered 2019-12-21: 150 mg via INTRAVENOUS
  Filled 2019-12-21: qty 60

## 2019-12-21 MED ORDER — SODIUM CHLORIDE 0.9 % IV SOLN
Freq: Once | INTRAVENOUS | Status: AC
  Filled 2019-12-21: qty 250

## 2019-12-21 MED ORDER — SODIUM CHLORIDE 0.9 % IV SOLN
Freq: Once | INTRAVENOUS | Status: DC
  Filled 2019-12-21: qty 250

## 2019-12-21 MED ORDER — SODIUM CHLORIDE 0.9% FLUSH
10.0000 mL | INTRAVENOUS | Status: DC | PRN
  Administered 2019-12-21: 10 mL
  Filled 2019-12-21: qty 10

## 2019-12-21 MED ORDER — DIPHENHYDRAMINE HCL 25 MG PO CAPS
50.0000 mg | ORAL_CAPSULE | Freq: Once | ORAL | Status: AC
  Administered 2019-12-21: 50 mg via ORAL

## 2019-12-21 MED ORDER — ACETAMINOPHEN 325 MG PO TABS
ORAL_TABLET | ORAL | Status: AC
  Filled 2019-12-21: qty 2

## 2019-12-21 MED ORDER — PROCHLORPERAZINE MALEATE 10 MG PO TABS
10.0000 mg | ORAL_TABLET | Freq: Once | ORAL | Status: AC
  Administered 2019-12-21: 10 mg via ORAL

## 2019-12-21 MED ORDER — XPOVIO (80 MG ONCE WEEKLY) 40 MG PO TBPK: 80.0000 mg | ORAL_TABLET | ORAL | 4 refills | Status: DC

## 2019-12-21 MED ORDER — DIPHENHYDRAMINE HCL 25 MG PO CAPS
ORAL_CAPSULE | ORAL | Status: AC
  Filled 2019-12-21: qty 2

## 2019-12-21 NOTE — Patient Instructions (Signed)
Carfilzomib injection What is this medicine? CARFILZOMIB (kar FILZ oh mib) targets a specific protein within cancer cells and stops the cancer cells from growing. It is used to treat multiple myeloma. This medicine may be used for other purposes; ask your health care provider or pharmacist if you have questions. COMMON BRAND NAME(S): KYPROLIS What should I tell my health care provider before I take this medicine? They need to know if you have any of these conditions:  heart disease  history of blood clots  irregular heartbeat  kidney disease  liver disease  lung or breathing disease  an unusual or allergic reaction to carfilzomib, or other medicines, foods, dyes, or preservatives  pregnant or trying to get pregnant  breast-feeding How should I use this medicine? This medicine is for injection or infusion into a vein. It is given by a health care professional in a hospital or clinic setting. Talk to your pediatrician regarding the use of this medicine in children. Special care may be needed. Overdosage: If you think you have taken too much of this medicine contact a poison control center or emergency room at once. NOTE: This medicine is only for you. Do not share this medicine with others. What if I miss a dose? It is important not to miss your dose. Call your doctor or health care professional if you are unable to keep an appointment. What may interact with this medicine? Interactions are not expected. Give your health care provider a list of all the medicines, herbs, non-prescription drugs, or dietary supplements you use. Also tell them if you smoke, drink alcohol, or use illegal drugs. Some items may interact with your medicine. This list may not describe all possible interactions. Give your health care provider a list of all the medicines, herbs, non-prescription drugs, or dietary supplements you use. Also tell them if you smoke, drink alcohol, or use illegal drugs. Some items  may interact with your medicine. What should I watch for while using this medicine? Your condition will be monitored carefully while you are receiving this medicine. Report any side effects. Continue your course of treatment even though you feel ill unless your doctor tells you to stop. You may need blood work done while you are taking this medicine. Do not become pregnant while taking this medicine or for at least 6 months after stopping it. Women should inform their doctor if they wish to become pregnant or think they might be pregnant. There is a potential for serious side effects to an unborn child. Men should not father a child while taking this medicine and for at least 3 months after stopping it. Talk to your health care professional or pharmacist for more information. Do not breast-feed an infant while taking this medicine or for 2 weeks after the last dose. Check with your doctor or health care professional if you get an attack of severe diarrhea, nausea and vomiting, or if you sweat a lot. The loss of too much body fluid can make it dangerous for you to take this medicine. You may get dizzy. Do not drive, use machinery, or do anything that needs mental alertness until you know how this medicine affects you. Do not stand or sit up quickly, especially if you are an older patient. This reduces the risk of dizzy or fainting spells. What side effects may I notice from receiving this medicine? Side effects that you should report to your doctor or health care professional as soon as possible:  allergic reactions like skin  rash, itching or hives, swelling of the face, lips, or tongue  confusion  dizziness  feeling faint or lightheaded  fever or chills  palpitations  seizures  signs and symptoms of bleeding such as bloody or black, tarry stools; red or dark-brown urine; spitting up blood or brown material that looks like coffee grounds; red spots on the skin; unusual bruising or bleeding  including from the eye, gums, or nose  signs and symptoms of a blood clot such as breathing problems; changes in vision; chest pain; severe, sudden headache; pain, swelling, warmth in the leg; trouble speaking; sudden numbness or weakness of the face, arm or leg  signs and symptoms of kidney injury like trouble passing urine or change in the amount of urine  signs and symptoms of liver injury like dark yellow or brown urine; general ill feeling or flu-like symptoms; light-colored stools; loss of appetite; nausea; right upper belly pain; unusually weak or tired; yellowing of the eyes or skin Side effects that usually do not require medical attention (report to your doctor or health care professional if they continue or are bothersome):  back pain  cough  diarrhea  headache  muscle cramps  trouble sleeping  vomiting This list may not describe all possible side effects. Call your doctor for medical advice about side effects. You may report side effects to FDA at 1-800-FDA-1088. Where should I keep my medicine? This drug is given in a hospital or clinic and will not be stored at home. NOTE: This sheet is a summary. It may not cover all possible information. If you have questions about this medicine, talk to your doctor, pharmacist, or health care provider.  2020 Elsevier/Gold Standard (2019-01-18 19:44:21)  

## 2019-12-21 NOTE — Progress Notes (Signed)
CBC and CMET reviewed with MD, ok to treat despite counts. 

## 2019-12-21 NOTE — Progress Notes (Signed)
Hematology and Oncology Follow Up Visit  Rickey [name] [id number] [birth year] 79 y.o. 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 Elotuzumab/Pomalidomide/decadron -every 4 weeks- start 01/12/2018 Kyprolis/Pomalidomide/Decadron -- s/p cycle #9 -- d/c pomaldomide in 01/2019 Xpovio 80 mg po q week -- start on 12/26/2019 IVIG 40 mg IV q 6 weeks   Interim History:  Rickey [name] is back for follow-up.  Unfortunately, they were probably going to have to make in addition to the Kyprolis.  His kappa light chain is going up slowly but surely.  His kappa light chain is still on the low side but over the past for 5 months, it has been trending upward.  I suspect that he probably is developing a clone that is becoming resistant to the Kyprolis.  I think that adding Selinexor to the Kyprolis would not be a bad idea.  I would not give him full dose Selinexor.  I would probably give him 80 mg weekly dose.  Hopefully, we will find that he is going to respond and that we do not need to move through to any more significant and toxic therapy.  Overall, he is doing quite nicely.  [place]  He and his wife really enjoyed up there.  They had family come up all the time.  He has had no fever.  His diabetes is doing pretty well.  He has had no problems with nausea or vomiting.  He has had no change in bowel or bladder habits.  He has had no leg swelling.  He has had no cough.  Overall, his performance status is ECOG one.     Medications:  Allergies as of 12/21/2019   No Known Allergies     Medication List       Accurate as of December 21, 2019 10:46 AM. If you have any questions, ask your nurse or doctor.         acyclovir 400 MG tablet Commonly known as: ZOVIRAX TAKE 1 TABLET BY MOUTH TWICE DAILY   amLODipine 2.5 MG tablet Commonly known as: NORVASC   aspirin 81 MG tablet Take 81 mg by mouth daily.   atorvastatin 10 MG tablet Commonly known as: LIPITOR Take 10 mg by mouth daily.   CALCIUM CARBONATE PO Take 220 mg by mouth 2 (two) times daily.   calcium-vitamin D 250-125 MG-UNIT tablet Commonly known as: OSCAL Take 1 tablet by mouth daily.   carfilzomib 30 MG Solr Infuse into the vein once every 3 weeks.   Cartridge Pump Misc Inject into the skin.   cycloSPORINE 0.05 % ophthalmic emulsion Commonly known as: RESTASIS Place 1 drop into both eyes 2 (two) times daily.   fish oil-omega-3 fatty acids 1000 MG capsule Take 1 g by mouth 2 (two) times daily.   imiquimod 5 % cream Commonly known as: ALDARA   INSULIN LISP & LISP PROT (HUM) Soper   insulin lispro 100 UNIT/ML injection Commonly known as: HUMALOG Inject into the skin. Insulin pump 12/21/2018 Typically 80U daily.   irbesartan-hydrochlorothiazide 300-12.5 MG tablet Commonly known as: AVALIDE Take 1 tablet by mouth daily.   lidocaine-prilocaine cream Commonly known as: EMLA Apply 1 application topically as needed.   montelukast 10 MG  tablet Commonly known as: SINGULAIR Take 10 mg by mouth at bedtime.   ondansetron 4 MG disintegrating tablet Commonly known as: ZOFRAN-ODT Take 1 tablet (4 mg total) by mouth 4 (four) times daily as needed for nausea or vomiting.   ondansetron 8 MG tablet Commonly known as: ZOFRAN Take 1 tablet (8 mg total) by mouth every 8 (eight) hours as needed for nausea or vomiting.   tamsulosin 0.4 MG Caps capsule Commonly known as: FLOMAX   telmisartan-hydrochlorothiazide 80-12.5 MG tablet Commonly known as: MICARDIS HCT   Vitamin D 50 MCG (2000 UT) Caps Take by mouth every morning.   zolpidem 10 MG tablet Commonly known as: AMBIEN       Allergies: No Known Allergies  Past  Medical History, Surgical history, Social history, and Family History were reviewed and updated.  Review of Systems: Review of Systems  Constitutional: Negative.   HENT: Negative.   Eyes: Negative.   Respiratory: Negative.   Cardiovascular: Negative.   Gastrointestinal: Negative.   Genitourinary: Negative.   Musculoskeletal: Negative.   Skin: Negative.   Neurological: Negative.   Endo/Heme/Allergies: Negative.   Psychiatric/Behavioral: Negative.      Physical Exam:  weight is 208 lb (94.3 kg) (abnormal). His oral temperature is 98.6 F (37 C). His blood pressure is 130/72 (abnormal) and his pulse is 74. His respiration is 18 and oxygen saturation is 100%.   Wt Readings from Last 3 Encounters:  12/21/19 (!) 208 lb (94.3 kg)  11/30/19 207 lb (93.9 kg)  11/08/19 209 lb 1.9 oz (94.9 kg)    Physical Exam Vitals reviewed.  HENT:     Head: Normocephalic and atraumatic.  Eyes:     Pupils: Pupils are equal, round, and reactive to light.  Cardiovascular:     Rate and Rhythm: Normal rate and regular rhythm.     Heart sounds: Normal heart sounds.  Pulmonary:     Effort: Pulmonary effort is normal.     Breath sounds: Normal breath sounds.  Abdominal:     General: Bowel sounds are normal.     Palpations: Abdomen is soft.  Musculoskeletal:        General: No tenderness or deformity. Normal range of motion.     Cervical back: Normal range of motion.  Lymphadenopathy:     Cervical: No cervical adenopathy.  Skin:    General: Skin is warm and dry.     Findings: No erythema or rash.  Neurological:     Mental Status: He is alert and oriented to person, place, and time.  Psychiatric:        Behavior: Behavior normal.        Thought Content: Thought content normal.        Judgment: Judgment normal.      Lab Results  Component Value Date   WBC 3.1 (L) 12/21/2019   HGB 16.2 12/21/2019   HCT 47.3 12/21/2019   MCV 102.6 (H) 12/21/2019   PLT 107 (L) 12/21/2019   Lab Results   Component Value Date   FERRITIN 258 12/28/2018   IRON 98 12/28/2018   TIBC 253 12/28/2018   UIBC 154 12/28/2018   IRONPCTSAT 39 12/28/2018   Lab Results  Component Value Date   RBC 4.61 12/21/2019   Lab Results  Component Value Date   KPAFRELGTCHN 38.2 (H) 11/30/2019   LAMBDASER 1.6 (L) 11/30/2019   KAPLAMBRATIO 23.88 (H) 11/30/2019   Lab Results  Component Value Date   IGGSERUM 625 11/30/2019   IGA 11 (  L) 11/30/2019   IGMSERUM 7 (L) 11/30/2019   Lab Results  Component Value Date   TOTALPROTELP 5.7 (L) 11/30/2019   ALBUMINELP 3.5 11/30/2019   A1GS 0.2 11/30/2019   A2GS 0.6 11/30/2019   BETS 0.9 11/30/2019   BETA2SER 0.3 04/19/2015   GAMS 0.5 11/30/2019   MSPIKE Not Observed 11/30/2019   SPEI Comment 10/18/2019     Chemistry      Component Value Date/Time   NA 137 12/21/2019 0932   NA 142 04/23/2017 1431   NA 139 07/18/2016 1135   K 4.1 12/21/2019 0932   K 4.0 04/23/2017 1431   K 4.4 07/18/2016 1135   CL 106 12/21/2019 0932   CL 105 04/23/2017 1431   CO2 26 12/21/2019 0932   CO2 28 04/23/2017 1431   CO2 27 07/18/2016 1135   BUN 19 12/21/2019 0932   BUN 20 04/23/2017 1431   BUN 12.2 07/18/2016 1135   CREATININE 1.04 12/21/2019 0932   CREATININE 1.2 04/23/2017 1431   CREATININE 0.9 07/18/2016 1135      Component Value Date/Time   CALCIUM 9.2 12/21/2019 0932   CALCIUM 9.3 04/23/2017 1431   CALCIUM 8.9 07/18/2016 1135   ALKPHOS 53 12/21/2019 0932   ALKPHOS 64 04/23/2017 1431   ALKPHOS 58 07/18/2016 1135   AST 39 12/21/2019 0932   AST 15 07/18/2016 1135   ALT 33 12/21/2019 0932   ALT 22 04/23/2017 1431   ALT 20 07/18/2016 1135   BILITOT 1.0 12/21/2019 0932   BILITOT 1.24 (H) 07/18/2016 1135      Impression and Plan: Mr. 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   Again, I believe that the trend has been established for his light chains.  We will try to catch  this early and make a change of therapy.  We will add the Selinexor at 80 mg p.o. weekly.  I think he will tolerate this dose.  I think that full dose probably would affect his blood counts in a negative way.  We will go ahead with the Kyprolis today.  He will not be due for IVIG until late August.  He really has done a fantastic job.  He has been very motivated.  He has a great family and great support system.  I forgot to mention I did have some basal cell carcinomas removed.  One was from the left ankle and was from the mid back.   [name], MD 7/27/202110:46 AM

## 2019-12-21 NOTE — Patient Instructions (Signed)
Implanted Port Insertion, Care After °This sheet gives you information about how to care for yourself after your procedure. Your health care provider may also give you more specific instructions. If you have problems or questions, contact your health care provider. °What can I expect after the procedure? °After the procedure, it is common to have: °· Discomfort at the port insertion site. °· Bruising on the skin over the port. This should improve over 3-4 days. °Follow these instructions at home: °Port care °· After your port is placed, you will get a manufacturer's information card. The card has information about your port. Keep this card with you at all times. °· Take care of the port as told by your health care provider. Ask your health care provider if you or a family member can get training for taking care of the port at home. A home health care nurse may also take care of the port. °· Make sure to remember what type of port you have. °Incision care ° °  ° °· Follow instructions from your health care provider about how to take care of your port insertion site. Make sure you: °? Wash your hands with soap and water before and after you change your bandage (dressing). If soap and water are not available, use hand sanitizer. °? Change your dressing as told by your health care provider. °? Leave stitches (sutures), skin glue, or adhesive strips in place. These skin closures may need to stay in place for 2 weeks or longer. If adhesive strip edges start to loosen and curl up, you may trim the loose edges. Do not remove adhesive strips completely unless your health care provider tells you to do that. °· Check your port insertion site every day for signs of infection. Check for: °? Redness, swelling, or pain. °? Fluid or blood. °? Warmth. °? Pus or a bad smell. °Activity °· Return to your normal activities as told by your health care provider. Ask your health care provider what activities are safe for you. °· Do not  lift anything that is heavier than 10 lb (4.5 kg), or the limit that you are told, until your health care provider says that it is safe. °General instructions °· Take over-the-counter and prescription medicines only as told by your health care provider. °· Do not take baths, swim, or use a hot tub until your health care provider approves. Ask your health care provider if you may take showers. You may only be allowed to take sponge baths. °· Do not drive for 24 hours if you were given a sedative during your procedure. °· Wear a medical alert bracelet in case of an emergency. This will tell any health care providers that you have a port. °· Keep all follow-up visits as told by your health care provider. This is important. °Contact a health care provider if: °· You cannot flush your port with saline as directed, or you cannot draw blood from the port. °· You have a fever or chills. °· You have redness, swelling, or pain around your port insertion site. °· You have fluid or blood coming from your port insertion site. °· Your port insertion site feels warm to the touch. °· You have pus or a bad smell coming from the port insertion site. °Get help right away if: °· You have chest pain or shortness of breath. °· You have bleeding from your port that you cannot control. °Summary °· Take care of the port as told by your health   care provider. Keep the manufacturer's information card with you at all times. °· Change your dressing as told by your health care provider. °· Contact a health care provider if you have a fever or chills or if you have redness, swelling, or pain around your port insertion site. °· Keep all follow-up visits as told by your health care provider. °This information is not intended to replace advice given to you by your health care provider. Make sure you discuss any questions you have with your health care provider. °Document Revised: 12/09/2017 Document Reviewed: 12/09/2017 °Elsevier Patient Education ©  2020 Elsevier Inc. ° °

## 2019-12-22 LAB — PROTEIN ELECTROPHORESIS, SERUM, WITH REFLEX
A/G Ratio: 1.2 (ref 0.7–1.7)
Albumin ELP: 3.2 g/dL (ref 2.9–4.4)
Alpha-1-Globulin: 0.2 g/dL (ref 0.0–0.4)
Alpha-2-Globulin: 0.6 g/dL (ref 0.4–1.0)
Beta Globulin: 0.9 g/dL (ref 0.7–1.3)
Gamma Globulin: 0.9 g/dL (ref 0.4–1.8)
Globulin, Total: 2.6 g/dL (ref 2.2–3.9)
Total Protein ELP: 5.8 g/dL — ABNORMAL LOW (ref 6.0–8.5)

## 2019-12-22 LAB — IGG, IGA, IGM
IgA: 12 mg/dL — ABNORMAL LOW (ref 61–437)
IgG (Immunoglobin G), Serum: 851 mg/dL (ref 603–1613)
IgM (Immunoglobulin M), Srm: 5 mg/dL — ABNORMAL LOW (ref 15–143)

## 2019-12-22 LAB — KAPPA/LAMBDA LIGHT CHAINS
Kappa free light chain: 44.9 mg/L — ABNORMAL HIGH (ref 3.3–19.4)
Kappa, lambda light chain ratio: 20.41 — ABNORMAL HIGH (ref 0.26–1.65)
Lambda free light chains: 2.2 mg/L — ABNORMAL LOW (ref 5.7–26.3)

## 2019-12-23 ENCOUNTER — Telehealth: Payer: Self-pay | Admitting: *Deleted

## 2019-12-23 NOTE — Telephone Encounter (Signed)
-----   Message from [name], MD sent at 12/22/2019  2:11 PM EDT ----- Call - the light is definitely going up -- now is 30.  Hopefully, the Selinexor will bring it back down!!  Laurey Arrow

## 2019-12-23 NOTE — Telephone Encounter (Signed)
As noted below by Dr. Marin Olp, I informed the patient that the light chain is now 108. Hopefully, the Selinexor will bring it back down. He verbalized understanding.

## 2019-12-24 ENCOUNTER — Other Ambulatory Visit: Payer: Self-pay | Admitting: *Deleted

## 2019-12-24 DIAGNOSIS — C9000 Multiple myeloma not having achieved remission: Secondary | ICD-10-CM

## 2019-12-24 MED ORDER — PROCHLORPERAZINE MALEATE 10 MG PO TABS: ORAL_TABLET | ORAL | 1 refills | Status: DC

## 2019-12-30 ENCOUNTER — Telehealth: Payer: Self-pay | Admitting: Pharmacist

## 2019-12-30 ENCOUNTER — Telehealth: Payer: Self-pay | Admitting: Pharmacy Technician

## 2019-12-30 DIAGNOSIS — C9000 Multiple myeloma not having achieved remission: Secondary | ICD-10-CM

## 2019-12-30 MED ORDER — XPOVIO (80 MG ONCE WEEKLY) 40 MG PO TBPK: 80.0000 mg | ORAL_TABLET | ORAL | 4 refills | Status: DC

## 2019-12-30 NOTE — Telephone Encounter (Signed)
Oral Oncology Patient Advocate Encounter   Received notification from [facility] that prior authorization for Xpovio is required.   PA submitted on CoverMyMeds Key [id number]  Status is pending   Oral Oncology Clinic will continue to follow.  [place] Patient [place] Phone [phone] Fax [phone] 12/31/2019 11:21 AM

## 2019-12-30 NOTE — Telephone Encounter (Signed)
Oral Oncology Pharmacist Encounter  Received new prescription for Xpovio (selinexor) for the treatment of multiple myeloma in conjunction with Kyprolis, planned duration until disease progression or unacceptable drug toxicity.  CBC/CMP from 12/15/19 assessed, Neutrophil count low but not at the level of dose adjustment, continue to monitor. Prescription dose and frequency assessed.   Current medication list in Epic reviewed, no DDIs with selinexor identified.  Evaluated chart and no patient barriers to medication adherence identified.   Prescription has been e-scribed to the [facility] for benefits analysis and approval.  Oral Oncology Clinic will continue to follow for insurance authorization, copayment issues, initial counseling and start date.  Darl Pikes, PharmD, BCPS, BCOP, CPP Hematology/Oncology Clinical Pharmacist Practitioner [facility] [phone]  12/30/2019 2:38 PM

## 2019-12-30 NOTE — Telephone Encounter (Signed)
Oral Chemotherapy Pharmacist Encounter  Due to medication restricted distrubution Xpovio cannot be filled at [facility]. Prescription has been e-scribed to [facility].  Supportive information was faxed to [place]. We will continue to follow medication access.   Called and notified patient, provided with new pharmacy number.  Darl Pikes, PharmD, BCPS, [facility] Hematology/Oncology Clinical Pharmacist [facility] [place] [phone]  12/30/2019 3:12 PM

## 2019-12-31 NOTE — Telephone Encounter (Signed)
Oral Oncology Patient Advocate Encounter  An urgent appeal has been sent for the prior authorization denial of Xpovio.   Appeal packet has been faxed to [phone].    The insurance company states that a 47 hour turn around time is to be expected for urgent redeterminations of denial.   This encounter will continue to be updated until final appeal determination.    Rickey [name] Patient [place] Phone [phone] Fax [phone] 12/31/2019 4:23 PM

## 2019-12-31 NOTE — Telephone Encounter (Signed)
Oral Oncology Patient Advocate Encounter  Received notification from [facility] that the request for prior authorization for Xpovio has been denied due to not meeting drug criteria.     An appeal packet is being put together and will be faxed when complete.  This encounter will continue to be updated until final determination.    [place] Patient [place] Phone [phone] Fax [phone] 12/31/2019 11:24 AM

## 2020-01-03 ENCOUNTER — Telehealth: Payer: Self-pay | Admitting: *Deleted

## 2020-01-03 DIAGNOSIS — H6123 Impacted cerumen, bilateral: Secondary | ICD-10-CM | POA: Diagnosis not present

## 2020-01-03 IMAGING — DX DG CHEST 2V
2 series · 2 of 2 positions shown · non-contrast
Comparison: 05/03/2014

CLINICAL DATA: 76-year-old male with a history of cough

EXAM:
CHEST - 2 VIEW

[chest pa]
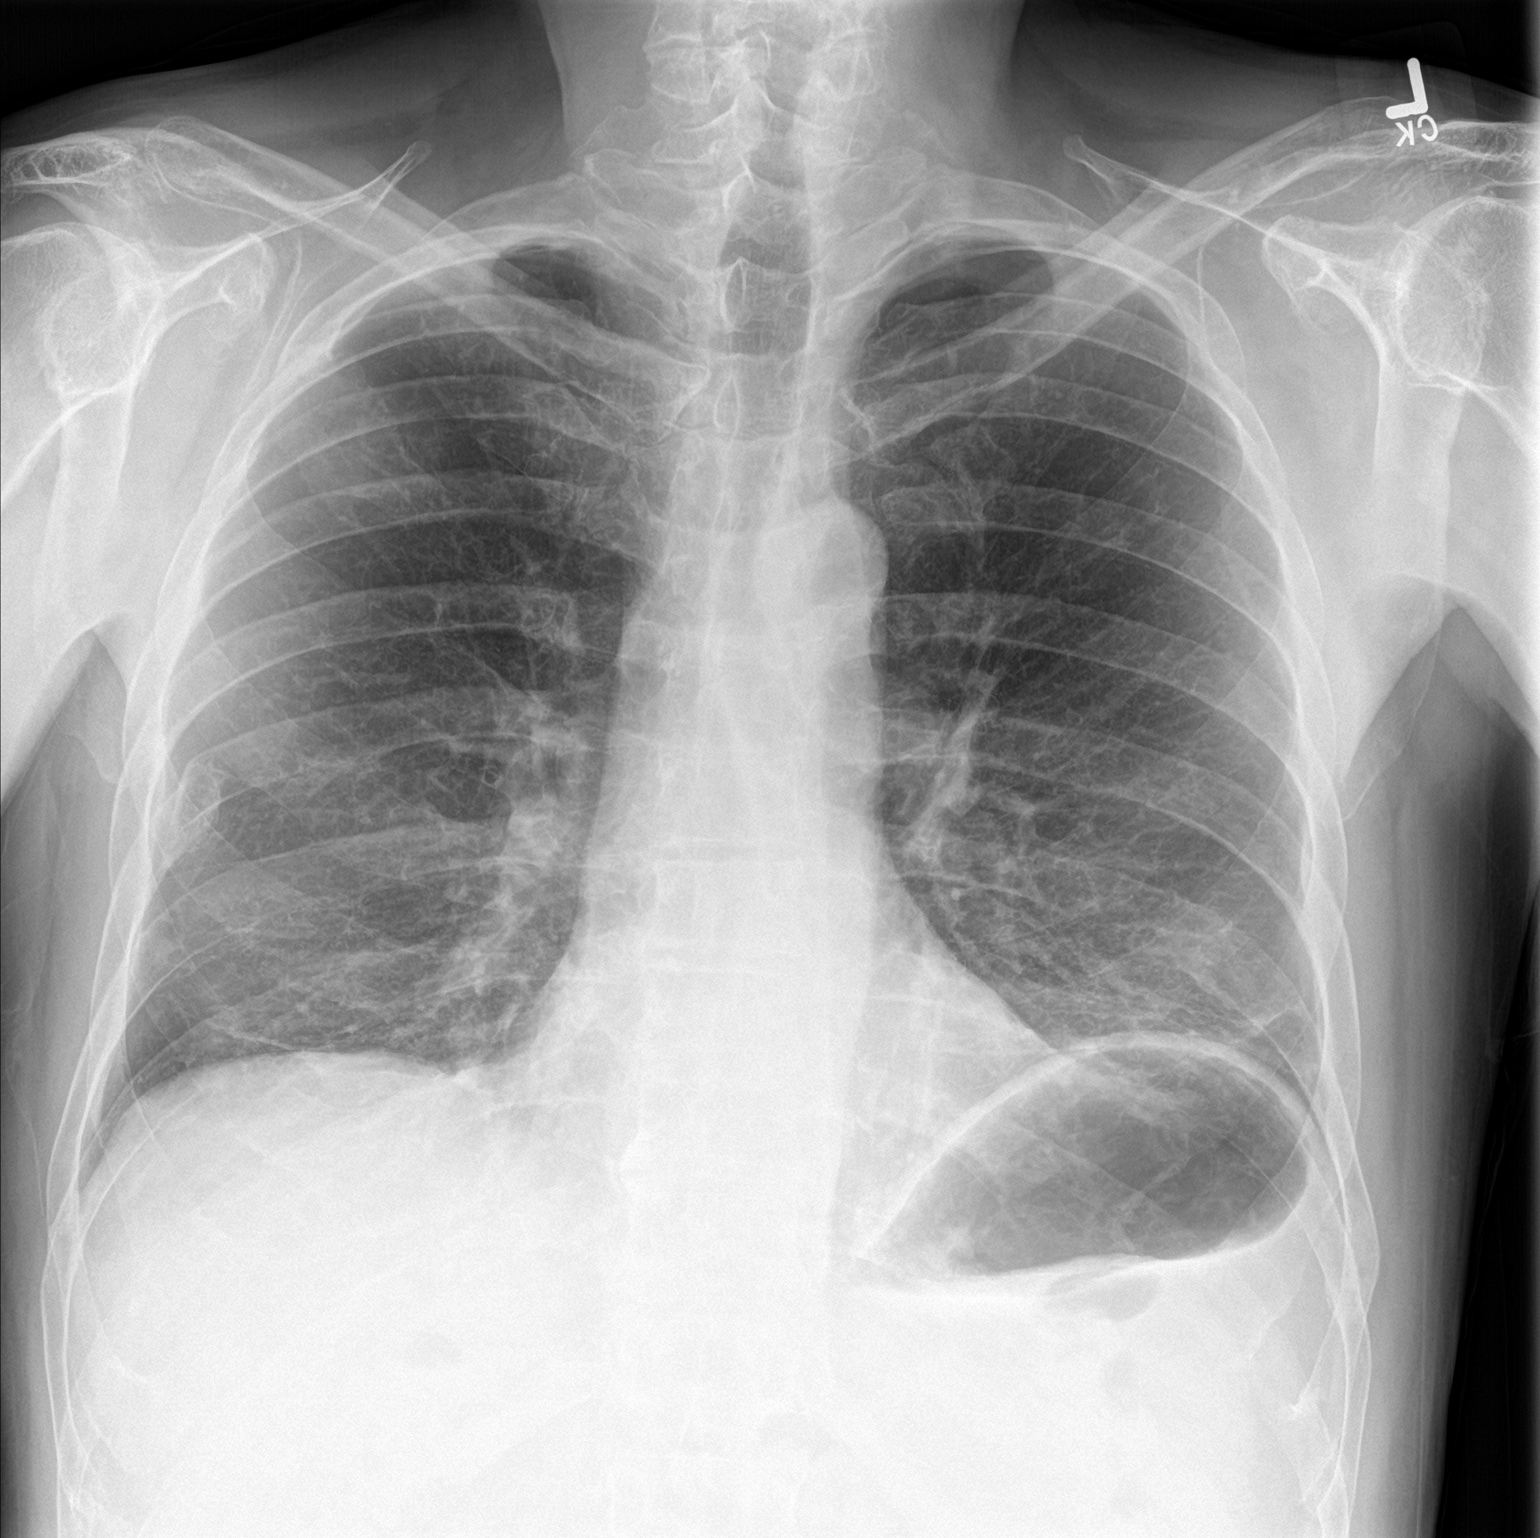

[chest lat]
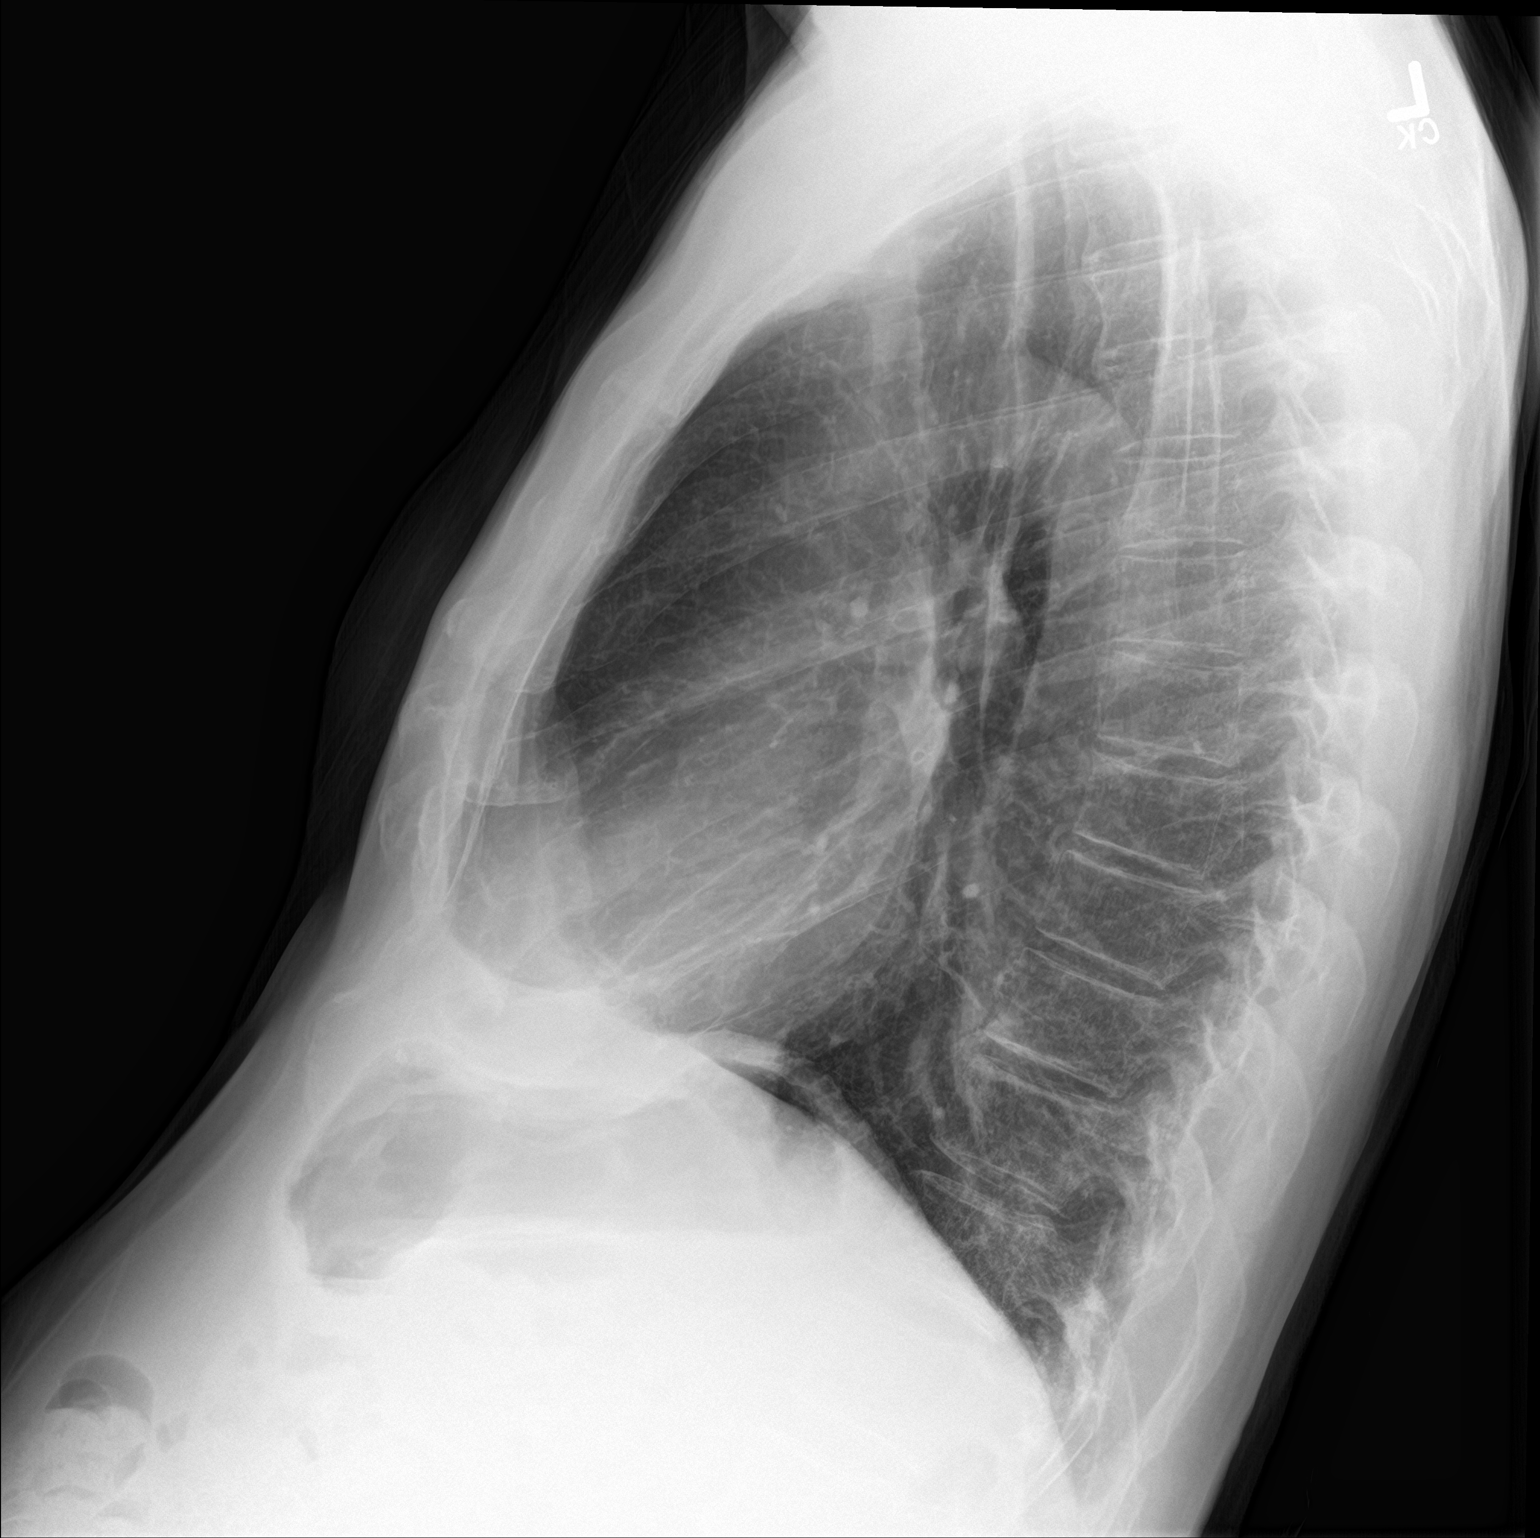

[2 of 2 positions shown; findings below may reference images not displayed]

FINDINGS: Cardiomediastinal silhouette within normal limits in size and
contour. No evidence of central vascular congestion or interlobular
septal thickening.

No confluent airspace disease. No pneumothorax or pleural effusion.
Coarsened interstitial markings. Chronic right chest wall deformity
IMPRESSION: Chronic lung changes without evidence of acute cardiopulmonary
disease

## 2020-01-03 NOTE — Telephone Encounter (Signed)
Message received from [employer] requesting pt.'s last office note with Dr. Marin Olp for PA for [facility].  Dr. Antonieta Pert office note from 12/21/19 faxed to [phone] per [employer] reqeust.

## 2020-01-03 NOTE — Telephone Encounter (Signed)
Oral Oncology Patient Advocate Encounter  Received notification from [facility] that the appeal for the prior authorization denial for Rickey [name] has been overturned.  Patient has a $3,182.02 copay for Xpovio.  [employer] has been notified of the approval.  [place] Patient [place] Phone [phone] Fax [phone] 01/03/2020 2:31 PM

## 2020-01-08 DIAGNOSIS — Z23 Encounter for immunization: Secondary | ICD-10-CM | POA: Diagnosis not present

## 2020-01-10 ENCOUNTER — Other Ambulatory Visit: Payer: Self-pay

## 2020-01-10 ENCOUNTER — Inpatient Hospital Stay: Payer: Medicare Other

## 2020-01-10 ENCOUNTER — Inpatient Hospital Stay: Payer: Medicare Other | Attending: Hematology & Oncology

## 2020-01-10 ENCOUNTER — Inpatient Hospital Stay (HOSPITAL_BASED_OUTPATIENT_CLINIC_OR_DEPARTMENT_OTHER): Payer: Medicare Other | Admitting: Hematology & Oncology

## 2020-01-10 ENCOUNTER — Encounter: Payer: Self-pay | Admitting: Hematology & Oncology

## 2020-01-10 VITALS — BP 144/81 | HR 74 | Temp 97.7°F | Resp 18 | Wt 208.0 lb

## 2020-01-10 DIAGNOSIS — Z5112 Encounter for antineoplastic immunotherapy: Secondary | ICD-10-CM | POA: Insufficient documentation

## 2020-01-10 DIAGNOSIS — C9000 Multiple myeloma not having achieved remission: Secondary | ICD-10-CM

## 2020-01-10 DIAGNOSIS — C9002 Multiple myeloma in relapse: Secondary | ICD-10-CM | POA: Diagnosis not present

## 2020-01-10 DIAGNOSIS — D801 Nonfamilial hypogammaglobulinemia: Secondary | ICD-10-CM

## 2020-01-10 LAB — CMP (CANCER CENTER ONLY)
ALT: 32 U/L (ref 0–44)
AST: 27 U/L (ref 15–41)
Albumin: 4.1 g/dL (ref 3.5–5.0)
Alkaline Phosphatase: 54 U/L (ref 38–126)
Anion gap: 8 (ref 5–15)
BUN: 30 mg/dL — ABNORMAL HIGH (ref 8–23)
CO2: 24 mmol/L (ref 22–32)
Calcium: 9 mg/dL (ref 8.9–10.3)
Chloride: 102 mmol/L (ref 98–111)
Creatinine: 1.4 mg/dL — ABNORMAL HIGH (ref 0.61–1.24)
GFR, Est AFR Am: 56 mL/min — ABNORMAL LOW (ref >60)
GFR, Estimated: 48 mL/min — ABNORMAL LOW (ref >60)
Glucose, Bld: 293 mg/dL — ABNORMAL HIGH (ref 70–99)
Potassium: 4.6 mmol/L (ref 3.5–5.1)
Sodium: 134 mmol/L — ABNORMAL LOW (ref 135–145)
Total Bilirubin: 1.3 mg/dL — ABNORMAL HIGH (ref 0.3–1.2)
Total Protein: 6 g/dL — ABNORMAL LOW (ref 6.5–8.1)

## 2020-01-10 LAB — CBC WITH DIFFERENTIAL (CANCER CENTER ONLY)
Abs Immature Granulocytes: 0.01 10*3/uL (ref 0.00–0.07)
Basophils Absolute: 0 10*3/uL (ref 0.0–0.1)
Basophils Relative: 1 %
Eosinophils Absolute: 0 10*3/uL (ref 0.0–0.5)
Eosinophils Relative: 0 %
HCT: 48.5 % (ref 39.0–52.0)
Hemoglobin: 16.9 g/dL (ref 13.0–17.0)
Immature Granulocytes: 0 %
Lymphocytes Relative: 24 %
Lymphs Abs: 1.1 10*3/uL (ref 0.7–4.0)
MCH: 35.1 pg — ABNORMAL HIGH (ref 26.0–34.0)
MCHC: 34.8 g/dL (ref 30.0–36.0)
MCV: 100.8 fL — ABNORMAL HIGH (ref 80.0–100.0)
Monocytes Absolute: 0.5 10*3/uL (ref 0.1–1.0)
Monocytes Relative: 12 %
Neutro Abs: 2.7 10*3/uL (ref 1.7–7.7)
Neutrophils Relative %: 63 %
Platelet Count: 98 10*3/uL — ABNORMAL LOW (ref 150–400)
RBC: 4.81 MIL/uL (ref 4.22–5.81)
RDW: 12.5 % (ref 11.5–15.5)
WBC Count: 4.3 10*3/uL (ref 4.0–10.5)
nRBC: 0 % (ref 0.0–0.2)

## 2020-01-10 MED ORDER — PROCHLORPERAZINE MALEATE 10 MG PO TABS
ORAL_TABLET | ORAL | Status: AC
  Filled 2020-01-10: qty 1

## 2020-01-10 MED ORDER — SODIUM CHLORIDE 0.9 % IV SOLN
Freq: Once | INTRAVENOUS | Status: DC
  Filled 2020-01-10: qty 250

## 2020-01-10 MED ORDER — SODIUM CHLORIDE 0.9% FLUSH
10.0000 mL | INTRAVENOUS | Status: DC | PRN
  Administered 2020-01-10: 10 mL
  Filled 2020-01-10: qty 10

## 2020-01-10 MED ORDER — SODIUM CHLORIDE 0.9 % IV SOLN
Freq: Once | INTRAVENOUS | Status: AC
  Filled 2020-01-10: qty 250

## 2020-01-10 MED ORDER — PROCHLORPERAZINE MALEATE 10 MG PO TABS
10.0000 mg | ORAL_TABLET | Freq: Once | ORAL | Status: AC
  Administered 2020-01-10: 10 mg via ORAL

## 2020-01-10 MED ORDER — HEPARIN SOD (PORK) LOCK FLUSH 100 UNIT/ML IV SOLN
500.0000 [IU] | Freq: Once | INTRAVENOUS | Status: AC | PRN
  Administered 2020-01-10: 500 [IU]
  Filled 2020-01-10: qty 5

## 2020-01-10 MED ORDER — ACETAMINOPHEN 325 MG PO TABS
650.0000 mg | ORAL_TABLET | Freq: Once | ORAL | Status: AC
  Administered 2020-01-10: 650 mg via ORAL

## 2020-01-10 MED ORDER — IMMUNE GLOBULIN (HUMAN) 20 GM/200ML IV SOLN
40.0000 g | Freq: Once | INTRAVENOUS | Status: AC
  Administered 2020-01-10: 40 g via INTRAVENOUS
  Filled 2020-01-10: qty 400

## 2020-01-10 MED ORDER — SODIUM CHLORIDE 0.9 % IV SOLN
20.0000 mg | Freq: Once | INTRAVENOUS | Status: AC
  Administered 2020-01-10: 20 mg via INTRAVENOUS
  Filled 2020-01-10: qty 2

## 2020-01-10 MED ORDER — DEXTROSE 5 % IV SOLN
70.0000 mg/m2 | Freq: Once | INTRAVENOUS | Status: AC
  Administered 2020-01-10: 150 mg via INTRAVENOUS
  Filled 2020-01-10: qty 15

## 2020-01-10 MED ORDER — DIPHENHYDRAMINE HCL 25 MG PO CAPS
50.0000 mg | ORAL_CAPSULE | Freq: Once | ORAL | Status: AC
  Administered 2020-01-10: 50 mg via ORAL

## 2020-01-10 MED ORDER — DIPHENHYDRAMINE HCL 25 MG PO CAPS
ORAL_CAPSULE | ORAL | Status: AC
  Filled 2020-01-10: qty 2

## 2020-01-10 MED ORDER — DEXTROSE 5 % IV SOLN
INTRAVENOUS | Status: DC
  Filled 2020-01-10: qty 250

## 2020-01-10 MED ORDER — ACETAMINOPHEN 325 MG PO TABS
ORAL_TABLET | ORAL | Status: AC
  Filled 2020-01-10: qty 1

## 2020-01-10 MED ORDER — ACETAMINOPHEN 325 MG PO TABS
ORAL_TABLET | ORAL | Status: AC
  Filled 2020-01-10: qty 2

## 2020-01-10 NOTE — Progress Notes (Signed)
Hematology and Oncology Follow Up Visit  MURVIN GIFT [id number] 12-31-1940 79 y.o. 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 Elotuzumab/Pomalidomide/decadron -every 4 weeks- start 01/12/2018 Kyprolis/Pomalidomide/Decadron -- s/p cycle #9 -- d/c pomaldomide in 01/2019 Xpovio 80 mg po q week -- start on 12/26/2019 IVIG 40 mg IV q 6 weeks -- next dose in 02/21/2020   Interim History:  Mr. Smethurst is back for follow-up.  He has started the Selinexor.  He just got his first dose last week.  He had a little bit of diarrhea with it.  We started the Selinexor along with the Kyprolis due to the fact that his light chains were on the way up.  His last kappa light chains back in July were 4.5 mg/dL.  He has had no problems with cough or shortness of breath.  He has had no issues with headache.  He has had no leg swelling.  His blood sugars are on the higher side.  I am sure that the Selinexor probably has done this.  Overall, his performance status is ECOG one.     Medications:  Allergies as of 01/10/2020   No Known Allergies     Medication List       Accurate as of January 10, 2020 11:41 AM. If you have any questions, ask your nurse or doctor.        acyclovir 400 MG tablet Commonly known as: ZOVIRAX TAKE 1 TABLET BY MOUTH TWICE DAILY   amLODipine 2.5 MG tablet Commonly known as: NORVASC   aspirin 81 MG tablet Take 81 mg by mouth daily.   atorvastatin 10 MG tablet Commonly known as: LIPITOR Take 10 mg by mouth daily.   CALCIUM CARBONATE PO Take 220 mg by mouth 2 (two) times daily.   calcium-vitamin D 250-125 MG-UNIT tablet Commonly known as: OSCAL Take 1 tablet by mouth daily.   carfilzomib 30 MG Solr Infuse  into the vein once every 3 weeks.   Cartridge Pump Misc Inject into the skin.   cycloSPORINE 0.05 % ophthalmic emulsion Commonly known as: RESTASIS Place 1 drop into both eyes 2 (two) times daily.   fish oil-omega-3 fatty acids 1000 MG capsule Take 1 g by mouth 2 (two) times daily.   imiquimod 5 % cream Commonly known as: ALDARA   INSULIN LISP & LISP PROT (HUM) [place]   insulin lispro 100 UNIT/ML injection Commonly known as: HUMALOG Inject into the skin. Insulin pump 12/21/2018 Typically 80U daily.   irbesartan-hydrochlorothiazide 300-12.5 MG tablet Commonly known as: AVALIDE Take 1 tablet by mouth daily.   lidocaine-prilocaine cream Commonly known as: EMLA Apply 1 application topically as needed.   montelukast 10 MG tablet Commonly known as: SINGULAIR Take 10 mg by mouth at bedtime.   ondansetron 4 MG disintegrating tablet Commonly known as: ZOFRAN-ODT Take 1 tablet (4 mg total) by mouth 4 (four) times daily as needed for nausea or vomiting.   ondansetron 8 MG tablet Commonly known as: ZOFRAN Take 1 tablet (8 mg total) by mouth every 8 (eight) hours as needed for nausea or vomiting.   prochlorperazine 10 MG tablet Commonly known as: COMPAZINE TAKE 1 TABLET(10 MG) BY MOUTH EVERY 6 HOURS AS NEEDED FOR NAUSEA OR VOMITING   tamsulosin 0.4  MG Caps capsule Commonly known as: FLOMAX   telmisartan-hydrochlorothiazide 80-12.5 MG tablet Commonly known as: MICARDIS HCT   Vitamin D 50 MCG (2000 UT) Caps Take by mouth every morning.   XPOVIO (80 MG ONCE WEEKLY) PO Take 80 mg by mouth once a week.   Xpovio (80 MG Once Weekly) 40 MG Tbpk Generic drug: Selinexor (80 MG Once Weekly) Take 80 mg by mouth once a week.   zolpidem 10 MG tablet Commonly known as: AMBIEN       Allergies: No Known Allergies  Past Medical History, Surgical history, Social history, and Family History were reviewed and updated.  Review of Systems: Review of Systems  Constitutional: Negative.    HENT: Negative.   Eyes: Negative.   Respiratory: Negative.   Cardiovascular: Negative.   Gastrointestinal: Negative.   Genitourinary: Negative.   Musculoskeletal: Negative.   Skin: Negative.   Neurological: Negative.   Endo/Heme/Allergies: Negative.   Psychiatric/Behavioral: Negative.      Physical Exam:  weight is 208 lb (94.3 kg). His oral temperature is 97.7 F (36.5 C). His blood pressure is 144/81 (abnormal) and his pulse is 74. His respiration is 18 and oxygen saturation is 100%.   Wt Readings from Last 3 Encounters:  01/10/20 208 lb (94.3 kg)  12/21/19 (!) 208 lb (94.3 kg)  11/30/19 207 lb (93.9 kg)    Physical Exam Vitals reviewed.  HENT:     Head: Normocephalic and atraumatic.  Eyes:     Pupils: Pupils are equal, round, and reactive to light.  Cardiovascular:     Rate and Rhythm: Normal rate and regular rhythm.     Heart sounds: Normal heart sounds.  Pulmonary:     Effort: Pulmonary effort is normal.     Breath sounds: Normal breath sounds.  Abdominal:     General: Bowel sounds are normal.     Palpations: Abdomen is soft.  Musculoskeletal:        General: No tenderness or deformity. Normal range of motion.     Cervical back: Normal range of motion.  Lymphadenopathy:     Cervical: No cervical adenopathy.  Skin:    General: Skin is warm and dry.     Findings: No erythema or rash.  Neurological:     Mental Status: He is alert and oriented to person, place, and time.  Psychiatric:        Behavior: Behavior normal.        Thought Content: Thought content normal.        Judgment: Judgment normal.      Lab Results  Component Value Date   WBC 4.3 01/10/2020   HGB 16.9 01/10/2020   HCT 48.5 01/10/2020   MCV 100.8 (H) 01/10/2020   PLT 98 (L) 01/10/2020   Lab Results  Component Value Date   FERRITIN 258 12/28/2018   IRON 98 12/28/2018   TIBC 253 12/28/2018   UIBC 154 12/28/2018   IRONPCTSAT 39 12/28/2018   Lab Results  Component Value Date    RBC 4.81 01/10/2020   Lab Results  Component Value Date   KPAFRELGTCHN 44.9 (H) 12/21/2019   LAMBDASER 2.2 (L) 12/21/2019   KAPLAMBRATIO 20.41 (H) 12/21/2019   Lab Results  Component Value Date   IGGSERUM 851 12/21/2019   IGA 12 (L) 12/21/2019   IGMSERUM 5 (L) 12/21/2019   Lab Results  Component Value Date   TOTALPROTELP 5.8 (L) 12/21/2019   ALBUMINELP 3.2 12/21/2019   A1GS 0.2 12/21/2019   A2GS 0.6  12/21/2019   BETS 0.9 12/21/2019   BETA2SER 0.3 04/19/2015   GAMS 0.9 12/21/2019   MSPIKE Not Observed 12/21/2019   SPEI Comment 10/18/2019     Chemistry      Component Value Date/Time   NA 134 (L) 01/10/2020 1017   NA 142 04/23/2017 1431   NA 139 07/18/2016 1135   K 4.6 01/10/2020 1017   K 4.0 04/23/2017 1431   K 4.4 07/18/2016 1135   CL 102 01/10/2020 1017   CL 105 04/23/2017 1431   CO2 24 01/10/2020 1017   CO2 28 04/23/2017 1431   CO2 27 07/18/2016 1135   BUN 30 (H) 01/10/2020 1017   BUN 20 04/23/2017 1431   BUN 12.2 07/18/2016 1135   CREATININE 1.40 (H) 01/10/2020 1017   CREATININE 1.2 04/23/2017 1431   CREATININE 0.9 07/18/2016 1135      Component Value Date/Time   CALCIUM 9.0 01/10/2020 1017   CALCIUM 9.3 04/23/2017 1431   CALCIUM 8.9 07/18/2016 1135   ALKPHOS 54 01/10/2020 1017   ALKPHOS 64 04/23/2017 1431   ALKPHOS 58 07/18/2016 1135   AST 27 01/10/2020 1017   AST 15 07/18/2016 1135   ALT 32 01/10/2020 1017   ALT 22 04/23/2017 1431   ALT 20 07/18/2016 1135   BILITOT 1.3 (H) 01/10/2020 1017   BILITOT 1.24 (H) 07/18/2016 1135      Impression and Plan: Mr. 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   Hopefully, adding the Selinexor to the Kyprolis is going to get him back into a much better remission.  He is still in good shape.  I really think that if things do not work well for [place], we may consider the CAR-T therapy with Abecma, which is the new BCMA targeted  agent.   We will check his light chains when we see him back in September.  [name], MD 8/16/202111:41 AM

## 2020-01-10 NOTE — Telephone Encounter (Signed)
Medication was delivered to patient on 01/06/20.

## 2020-01-10 NOTE — Progress Notes (Signed)
Reviewed pt labs with Dr. Marin Olp and pt ok to treat with platelets 98  Pt declined to stay for post infusion observation period. Pt stated he has tolerated medication multiple times prior without difficulty. Pt aware to call clinic with any questions or concerns. Pt verbalized understanding and had no further questions.

## 2020-01-10 NOTE — Patient Instructions (Signed)

## 2020-01-10 NOTE — Patient Instructions (Signed)
[facility] Discharge Instructions for Patients Receiving Chemotherapy  Today you received the following chemotherapy agents Kyprolis  To help prevent nausea and vomiting after your treatment, we encourage you to take your nausea medication as prescribed by MD.   If you develop nausea and vomiting that is not controlled by your nausea medication, call the clinic.   BELOW ARE SYMPTOMS THAT SHOULD BE REPORTED IMMEDIATELY:  *FEVER GREATER THAN 100.5 F  *CHILLS WITH OR WITHOUT FEVER  NAUSEA AND VOMITING THAT IS NOT CONTROLLED WITH YOUR NAUSEA MEDICATION  *UNUSUAL SHORTNESS OF BREATH  *UNUSUAL BRUISING OR BLEEDING  TENDERNESS IN MOUTH AND THROAT WITH OR WITHOUT PRESENCE OF ULCERS  *URINARY PROBLEMS  *BOWEL PROBLEMS  UNUSUAL RASH Items with * indicate a potential emergency and should be followed up as soon as possible.  Feel free to call the clinic should you have any questions or concerns. The clinic phone number is [phone].  Please show the CHEMO ALERT CARD at check-in to the Emergency Department and triage nurse.   

## 2020-01-11 ENCOUNTER — Other Ambulatory Visit: Payer: Medicare Other

## 2020-01-11 ENCOUNTER — Ambulatory Visit: Payer: Medicare Other

## 2020-01-11 DIAGNOSIS — Z48810 Encounter for surgical aftercare following surgery on the sense organs: Secondary | ICD-10-CM | POA: Diagnosis not present

## 2020-01-11 DIAGNOSIS — Z9889 Other specified postprocedural states: Secondary | ICD-10-CM | POA: Diagnosis not present

## 2020-01-11 NOTE — Telephone Encounter (Signed)
Oral Chemotherapy Pharmacist Encounter  Ms. Corkins's received his Xpovio on 01/06/20 and took his first dose that day. He reported tolerating his first dose.  Patient Education I spoke with patient for overview of new oral chemotherapy medication: Xpovio (selinexor) for the treatment of multiple myeloma in conjunction with Kyprolis, planned duration until disease progression or unacceptable drug toxicity.   Pt is doing well. Counseled patient on administration, dosing, side effects, monitoring, drug-food interactions, safe handling, storage, and disposal. Patient will take 80 mg by mouth once a week.  Side effects include but not limited to: N/V, fatigue, diarrhea, decreased wbc/hgb/plt.    Patient reported no nausea so far after his first dose. I recommended that he take a ondansetron tablet 30-60 mins before his next Xpovio dose to start ahead of any potential nausea.   Patient reported having diarrhea for about 8-12 hours following his first dose. He did not take anything for the diarrhea. We discussed using loperamide as needed for future diarrhea.  Reviewed with patient importance of keeping a medication schedule and plan for any missed doses.  After discussion with patient no patient barriers to medication adherence identified.   Mr. Gracy voiced understanding and appreciation. All questions answered. Medication handout placed in the mail.  Provided patient with [facility] phone number. Patient knows to call the office with questions or concerns. Oral Chemotherapy Navigation Clinic will continue to follow.  Darl Pikes, PharmD, BCPS, BCOP, CPP Hematology/Oncology Clinical Pharmacist Practitioner [facility] [phone]  01/11/2020 2:14 PM

## 2020-01-17 IMAGING — US IR FLUORO GUIDE CV LINE*L*
1 series · 1 of 1 positions shown · non-contrast
Comparison: None.

INDICATION: 76-year-old with multiple myeloma. Patient needs Port-A-Cath for
treatment.

EXAM:
FLUOROSCOPIC AND ULTRASOUND GUIDED PLACEMENT OF A SUBCUTANEOUS PORT

[Series 1: ir fluoro guide cv line*left* · 1 of 1 slices shown]
[im 1/1]
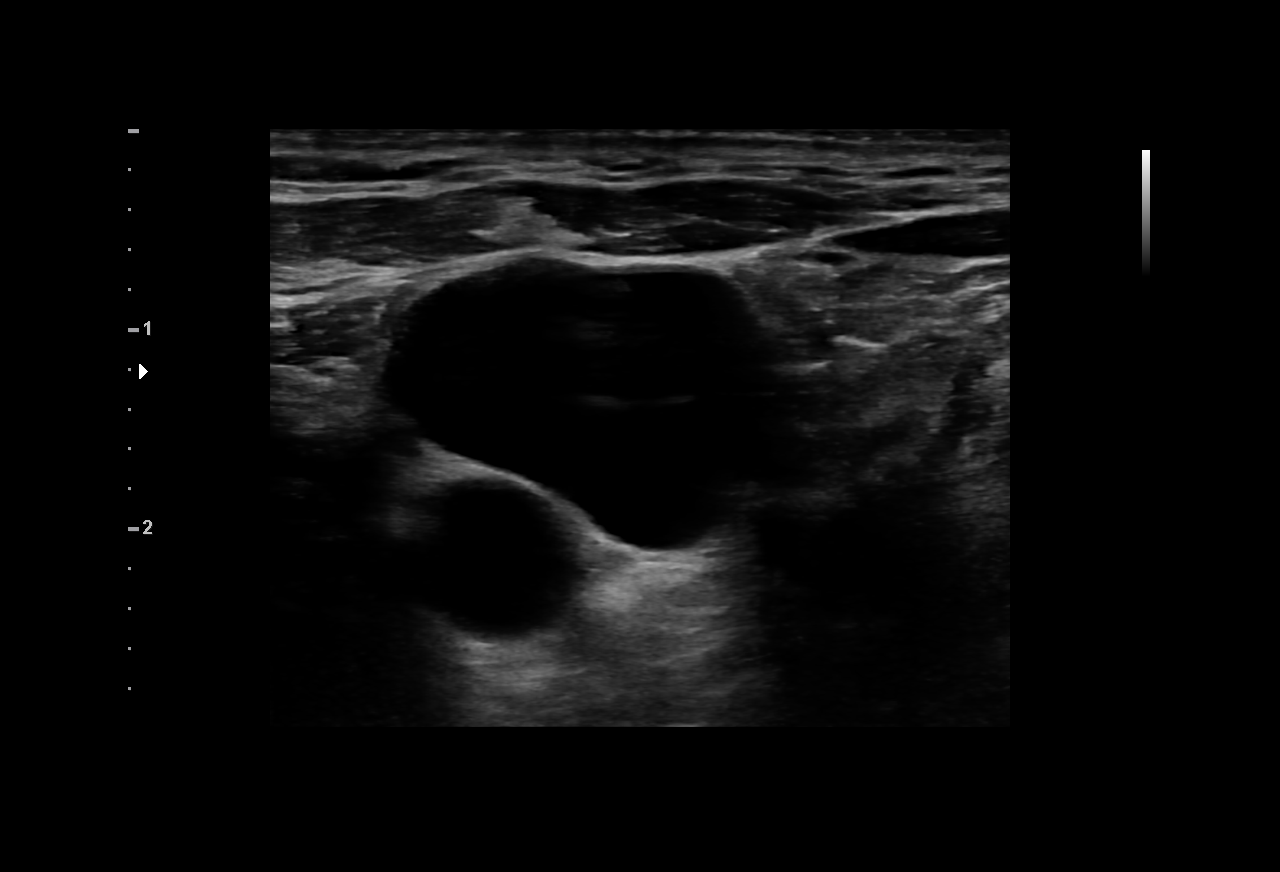

[1 of 1 positions shown; findings below may reference images not displayed]

MEDICATIONS:
Ancef 2 g; The antibiotic was administered within an appropriate
time interval prior to skin puncture.

ANESTHESIA/SEDATION:
Versed 2.0 mg IV; Fentanyl 100 mcg IV;

Moderate Sedation Time:  37 minutes

The patient was continuously monitored during the procedure by the
interventional radiology nurse under my direct supervision.

FLUOROSCOPY TIME:  48 seconds, 3 mGy

COMPLICATIONS:
None immediate.

PROCEDURE:
The procedure, risks, benefits, and alternatives were explained to
the patient. Questions regarding the procedure were encouraged and
answered. The patient understands and consents to the procedure.

Patient was placed supine on the interventional table. Ultrasound
confirmed a patent right internal jugular vein and ultrasound image
was taken and saved for documentation. The right chest and neck were
cleaned with a skin antiseptic and a sterile drape was placed.
Maximal barrier sterile technique was utilized including caps, mask,
sterile gowns, sterile gloves, sterile drape, hand hygiene and skin
antiseptic. The right neck was anesthetized with 1% lidocaine. Small
incision was made in the right neck with a blade. Micropuncture set
was placed in the right internal jugular vein with ultrasound
guidance. The micropuncture wire was used for measurement purposes.
The right chest was anesthetized with 1% lidocaine with epinephrine.
#15 blade was used to make an incision and a subcutaneous port
pocket was formed. 8 french Power Port was assembled. Subcutaneous
tunnel was formed with a stiff tunneling device. The port catheter
was brought through the subcutaneous tunnel. The port was placed in
the subcutaneous pocket. The micropuncture set was exchanged for a
peel-away sheath. The catheter was placed through the peel-away
sheath and the tip was positioned at the junction of the SVC and
right atrium. Catheter placement was confirmed with fluoroscopy. The
port was accessed and flushed with heparinized saline. The port
pocket was closed using two layers of absorbable sutures and
Dermabond. The vein skin site was closed using a single layer of
absorbable suture and Dermabond. Sterile dressings were applied.
Patient tolerated the procedure well without an immediate
complication. Ultrasound and fluoroscopic images were taken and
saved for this procedure.
FINDINGS: Catheter tip at the junction of the SVC and right atrium.
IMPRESSION: Placement of a power injectable port device.

## 2020-01-24 ENCOUNTER — Telehealth: Payer: Self-pay | Admitting: *Deleted

## 2020-01-24 DIAGNOSIS — E785 Hyperlipidemia, unspecified: Secondary | ICD-10-CM | POA: Diagnosis not present

## 2020-01-24 DIAGNOSIS — C9000 Multiple myeloma not having achieved remission: Secondary | ICD-10-CM | POA: Diagnosis not present

## 2020-01-24 DIAGNOSIS — C4491 Basal cell carcinoma of skin, unspecified: Secondary | ICD-10-CM | POA: Diagnosis not present

## 2020-01-24 DIAGNOSIS — E109 Type 1 diabetes mellitus without complications: Secondary | ICD-10-CM | POA: Diagnosis not present

## 2020-01-24 DIAGNOSIS — I10 Essential (primary) hypertension: Secondary | ICD-10-CM | POA: Diagnosis not present

## 2020-01-24 NOTE — Telephone Encounter (Signed)
Message received from patient stating that he has been on Xpovio 80 mg weekly for approximately three weeks and that he has been having severe diarrhea which lasts for approximately 24 hours after taking Xpovio and would like to know if he can decrease the dose down to 40 mg weekly. Dr. Marin Olp notified.  Call placed back to patient and patient notified per order of Dr. Marin Olp to hold Xpovio this week, to take Xpovio 40 mg weekly for the next two weeks and then to increase dose to 80 mg weekly if tolerated. Teach back done.  Pt appreciative of call back and has no further questions at this time.

## 2020-02-01 ENCOUNTER — Inpatient Hospital Stay: Payer: Medicare Other

## 2020-02-01 ENCOUNTER — Inpatient Hospital Stay: Payer: Medicare Other | Attending: Hematology & Oncology

## 2020-02-01 ENCOUNTER — Encounter: Payer: Self-pay | Admitting: Hematology & Oncology

## 2020-02-01 ENCOUNTER — Inpatient Hospital Stay (HOSPITAL_BASED_OUTPATIENT_CLINIC_OR_DEPARTMENT_OTHER): Payer: Medicare Other | Admitting: Hematology & Oncology

## 2020-02-01 ENCOUNTER — Other Ambulatory Visit: Payer: Self-pay

## 2020-02-01 VITALS — Wt 206.0 lb

## 2020-02-01 VITALS — BP 137/69 | HR 64 | Temp 97.8°F | Resp 18

## 2020-02-01 DIAGNOSIS — C9000 Multiple myeloma not having achieved remission: Secondary | ICD-10-CM

## 2020-02-01 DIAGNOSIS — E119 Type 2 diabetes mellitus without complications: Secondary | ICD-10-CM | POA: Insufficient documentation

## 2020-02-01 DIAGNOSIS — Z5112 Encounter for antineoplastic immunotherapy: Secondary | ICD-10-CM | POA: Insufficient documentation

## 2020-02-01 DIAGNOSIS — C9002 Multiple myeloma in relapse: Secondary | ICD-10-CM | POA: Diagnosis not present

## 2020-02-01 DIAGNOSIS — R197 Diarrhea, unspecified: Secondary | ICD-10-CM | POA: Diagnosis not present

## 2020-02-01 DIAGNOSIS — Z95828 Presence of other vascular implants and grafts: Secondary | ICD-10-CM

## 2020-02-01 LAB — CMP (CANCER CENTER ONLY)
ALT: 59 U/L — ABNORMAL HIGH (ref 0–44)
AST: 48 U/L — ABNORMAL HIGH (ref 15–41)
Albumin: 3.6 g/dL (ref 3.5–5.0)
Alkaline Phosphatase: 78 U/L (ref 38–126)
Anion gap: 7 (ref 5–15)
BUN: 21 mg/dL (ref 8–23)
CO2: 24 mmol/L (ref 22–32)
Calcium: 8.9 mg/dL (ref 8.9–10.3)
Chloride: 107 mmol/L (ref 98–111)
Creatinine: 1 mg/dL (ref 0.61–1.24)
GFR, Est AFR Am: >60 mL/min (ref >60)
GFR, Estimated: >60 mL/min (ref >60)
Glucose, Bld: 170 mg/dL — ABNORMAL HIGH (ref 70–99)
Potassium: 3.9 mmol/L (ref 3.5–5.1)
Sodium: 138 mmol/L (ref 135–145)
Total Bilirubin: 0.8 mg/dL (ref 0.3–1.2)
Total Protein: 5.7 g/dL — ABNORMAL LOW (ref 6.5–8.1)

## 2020-02-01 LAB — CBC WITH DIFFERENTIAL (CANCER CENTER ONLY)
Abs Immature Granulocytes: 0.01 10*3/uL (ref 0.00–0.07)
Basophils Absolute: 0 10*3/uL (ref 0.0–0.1)
Basophils Relative: 1 %
Eosinophils Absolute: 0 10*3/uL (ref 0.0–0.5)
Eosinophils Relative: 1 %
HCT: 43.3 % (ref 39.0–52.0)
Hemoglobin: 14.8 g/dL (ref 13.0–17.0)
Immature Granulocytes: 0 %
Lymphocytes Relative: 31 %
Lymphs Abs: 1.1 10*3/uL (ref 0.7–4.0)
MCH: 35 pg — ABNORMAL HIGH (ref 26.0–34.0)
MCHC: 34.2 g/dL (ref 30.0–36.0)
MCV: 102.4 fL — ABNORMAL HIGH (ref 80.0–100.0)
Monocytes Absolute: 0.5 10*3/uL (ref 0.1–1.0)
Monocytes Relative: 14 %
Neutro Abs: 1.8 10*3/uL (ref 1.7–7.7)
Neutrophils Relative %: 53 %
Platelet Count: 93 10*3/uL — ABNORMAL LOW (ref 150–400)
RBC: 4.23 MIL/uL (ref 4.22–5.81)
RDW: 13.2 % (ref 11.5–15.5)
WBC Count: 3.4 10*3/uL — ABNORMAL LOW (ref 4.0–10.5)
nRBC: 0 % (ref 0.0–0.2)

## 2020-02-01 MED ORDER — DIPHENHYDRAMINE HCL 25 MG PO CAPS
50.0000 mg | ORAL_CAPSULE | Freq: Once | ORAL | Status: AC
  Administered 2020-02-01: 50 mg via ORAL

## 2020-02-01 MED ORDER — DEXTROSE 5 % IV SOLN
70.0000 mg/m2 | Freq: Once | INTRAVENOUS | Status: AC
  Administered 2020-02-01: 150 mg via INTRAVENOUS
  Filled 2020-02-01: qty 60

## 2020-02-01 MED ORDER — DIPHENOXYLATE-ATROPINE 2.5-0.025 MG PO TABS: 1.0000 | ORAL_TABLET | Freq: Four times a day (QID) | ORAL | 3 refills | Status: DC | PRN

## 2020-02-01 MED ORDER — PROCHLORPERAZINE MALEATE 10 MG PO TABS
10.0000 mg | ORAL_TABLET | Freq: Once | ORAL | Status: AC
  Administered 2020-02-01: 10 mg via ORAL

## 2020-02-01 MED ORDER — ACETAMINOPHEN 325 MG PO TABS
ORAL_TABLET | ORAL | Status: AC
  Filled 2020-02-01: qty 2

## 2020-02-01 MED ORDER — SODIUM CHLORIDE 0.9 % IV SOLN
Freq: Once | INTRAVENOUS | Status: AC
  Filled 2020-02-01: qty 250

## 2020-02-01 MED ORDER — SODIUM CHLORIDE 0.9% FLUSH
10.0000 mL | Freq: Once | INTRAVENOUS | Status: AC
  Administered 2020-02-01: 10 mL via INTRAVENOUS
  Filled 2020-02-01: qty 10

## 2020-02-01 MED ORDER — HEPARIN SOD (PORK) LOCK FLUSH 100 UNIT/ML IV SOLN
500.0000 [IU] | Freq: Once | INTRAVENOUS | Status: AC | PRN
  Administered 2020-02-01: 500 [IU]
  Filled 2020-02-01: qty 5

## 2020-02-01 MED ORDER — SODIUM CHLORIDE 0.9% FLUSH
10.0000 mL | INTRAVENOUS | Status: DC | PRN
  Administered 2020-02-01: 10 mL
  Filled 2020-02-01: qty 10

## 2020-02-01 MED ORDER — ACETAMINOPHEN 325 MG PO TABS
650.0000 mg | ORAL_TABLET | Freq: Once | ORAL | Status: AC
  Administered 2020-02-01: 650 mg via ORAL

## 2020-02-01 MED ORDER — SODIUM CHLORIDE 0.9 % IV SOLN
20.0000 mg | Freq: Once | INTRAVENOUS | Status: AC
  Administered 2020-02-01: 20 mg via INTRAVENOUS
  Filled 2020-02-01: qty 2

## 2020-02-01 MED ORDER — PROCHLORPERAZINE MALEATE 10 MG PO TABS
ORAL_TABLET | ORAL | Status: AC
  Filled 2020-02-01: qty 1

## 2020-02-01 MED ORDER — DIPHENHYDRAMINE HCL 25 MG PO CAPS
ORAL_CAPSULE | ORAL | Status: AC
  Filled 2020-02-01: qty 2

## 2020-02-01 NOTE — Patient Instructions (Signed)
Carfilzomib injection What is this medicine? CARFILZOMIB (kar FILZ oh mib) targets a specific protein within cancer cells and stops the cancer cells from growing. It is used to treat multiple myeloma. This medicine may be used for other purposes; ask your health care provider or pharmacist if you have questions. COMMON BRAND NAME(S): KYPROLIS What should I tell my health care provider before I take this medicine? They need to know if you have any of these conditions:  heart disease  history of blood clots  irregular heartbeat  kidney disease  liver disease  lung or breathing disease  an unusual or allergic reaction to carfilzomib, or other medicines, foods, dyes, or preservatives  pregnant or trying to get pregnant  breast-feeding How should I use this medicine? This medicine is for injection or infusion into a vein. It is given by a health care professional in a hospital or clinic setting. Talk to your pediatrician regarding the use of this medicine in children. Special care may be needed. Overdosage: If you think you have taken too much of this medicine contact a poison control center or emergency room at once. NOTE: This medicine is only for you. Do not share this medicine with others. What if I miss a dose? It is important not to miss your dose. Call your doctor or health care professional if you are unable to keep an appointment. What may interact with this medicine? Interactions are not expected. Give your health care provider a list of all the medicines, herbs, non-prescription drugs, or dietary supplements you use. Also tell them if you smoke, drink alcohol, or use illegal drugs. Some items may interact with your medicine. This list may not describe all possible interactions. Give your health care provider a list of all the medicines, herbs, non-prescription drugs, or dietary supplements you use. Also tell them if you smoke, drink alcohol, or use illegal drugs. Some items  may interact with your medicine. What should I watch for while using this medicine? Your condition will be monitored carefully while you are receiving this medicine. Report any side effects. Continue your course of treatment even though you feel ill unless your doctor tells you to stop. You may need blood work done while you are taking this medicine. Do not become pregnant while taking this medicine or for at least 6 months after stopping it. Women should inform their doctor if they wish to become pregnant or think they might be pregnant. There is a potential for serious side effects to an unborn child. Men should not father a child while taking this medicine and for at least 3 months after stopping it. Talk to your health care professional or pharmacist for more information. Do not breast-feed an infant while taking this medicine or for 2 weeks after the last dose. Check with your doctor or health care professional if you get an attack of severe diarrhea, nausea and vomiting, or if you sweat a lot. The loss of too much body fluid can make it dangerous for you to take this medicine. You may get dizzy. Do not drive, use machinery, or do anything that needs mental alertness until you know how this medicine affects you. Do not stand or sit up quickly, especially if you are an older patient. This reduces the risk of dizzy or fainting spells. What side effects may I notice from receiving this medicine? Side effects that you should report to your doctor or health care professional as soon as possible:  allergic reactions like skin  rash, itching or hives, swelling of the face, lips, or tongue  confusion  dizziness  feeling faint or lightheaded  fever or chills  palpitations  seizures  signs and symptoms of bleeding such as bloody or black, tarry stools; red or dark-brown urine; spitting up blood or brown material that looks like coffee grounds; red spots on the skin; unusual bruising or bleeding  including from the eye, gums, or nose  signs and symptoms of a blood clot such as breathing problems; changes in vision; chest pain; severe, sudden headache; pain, swelling, warmth in the leg; trouble speaking; sudden numbness or weakness of the face, arm or leg  signs and symptoms of kidney injury like trouble passing urine or change in the amount of urine  signs and symptoms of liver injury like dark yellow or brown urine; general ill feeling or flu-like symptoms; light-colored stools; loss of appetite; nausea; right upper belly pain; unusually weak or tired; yellowing of the eyes or skin Side effects that usually do not require medical attention (report to your doctor or health care professional if they continue or are bothersome):  back pain  cough  diarrhea  headache  muscle cramps  trouble sleeping  vomiting This list may not describe all possible side effects. Call your doctor for medical advice about side effects. You may report side effects to FDA at 1-800-FDA-1088. Where should I keep my medicine? This drug is given in a hospital or clinic and will not be stored at home. NOTE: This sheet is a summary. It may not cover all possible information. If you have questions about this medicine, talk to your doctor, pharmacist, or health care provider.  2020 Elsevier/Gold Standard (2019-01-18 19:44:21) Dexamethasone injection What is this medicine? DEXAMETHASONE (dex a METH a sone) is a corticosteroid. It is used to treat inflammation of the skin, joints, lungs, and other organs. Common conditions treated include asthma, allergies, and arthritis. It is also used for other conditions, like blood disorders and diseases of the adrenal glands. This medicine may be used for other purposes; ask your health care provider or pharmacist if you have questions. COMMON BRAND NAME(S): Decadron, DoubleDex, Simplist Dexamethasone, Solurex What should I tell my health care provider before I take  this medicine? They need to know if you have any of these conditions:  Cushing's syndrome  diabetes  glaucoma  heart disease  high blood pressure  infection like herpes, measles, tuberculosis, or chickenpox  kidney disease  liver disease  mental illness  myasthenia gravis  osteoporosis  previous heart attack  seizures  stomach or intestine problems  thyroid disease  an unusual or allergic reaction to dexamethasone, corticosteroids, other medicines, lactose, foods, dyes, or preservatives  pregnant or trying to get pregnant  breast-feeding How should I use this medicine? This medicine is for injection into a muscle, joint, lesion, soft tissue, or vein. It is given by a health care professional in a hospital or clinic setting. Talk to your pediatrician regarding the use of this medicine in children. Special care may be needed. Overdosage: If you think you have taken too much of this medicine contact a poison control center or emergency room at once. NOTE: This medicine is only for you. Do not share this medicine with others. What if I miss a dose? This may not apply. If you are having a series of injections over a prolonged period, try not to miss an appointment. Call your doctor or health care professional to reschedule if you are unable  to keep an appointment. What may interact with this medicine? Do not take this medicine with any of the following medications:  live virus vaccines This medicine may also interact with the following medications:  aminoglutethimide  amphotericin B  aspirin and aspirin-like medicines  certain antibiotics like erythromycin, clarithromycin, and troleandomycin  certain antivirals for HIV or hepatitis  certain medicines for seizures like carbamazepine, phenobarbital, phenytoin  certain medicines to treat myasthenia gravis  cholestyramine  cyclosporine  digoxin  diuretics  ephedrine  male hormones, like estrogen or  progestins and birth control pills  insulin or other medicines for diabetes  isoniazid  ketoconazole  medicines that relax muscles for surgery  mifepristone  NSAIDs, medicines for pain and inflammation, like ibuprofen or naproxen  rifampin  skin tests for allergies  thalidomide  vaccines  warfarin This list may not describe all possible interactions. Give your health care provider a list of all the medicines, herbs, non-prescription drugs, or dietary supplements you use. Also tell them if you smoke, drink alcohol, or use illegal drugs. Some items may interact with your medicine. What should I watch for while using this medicine? Visit your health care professional for regular checks on your progress. Tell your health care professional if your symptoms do not start to get better or if they get worse. Your condition will be monitored carefully while you are receiving this medicine. Wear a medical ID bracelet or chain. Carry a card that describes your disease and details of your medicine and dosage times. This medicine may increase your risk of getting an infection. Call your health care professional for advice if you get a fever, chills, or sore throat, or other symptoms of a cold or flu. Do not treat yourself. Try to avoid being around people who are sick. Call your health care professional if you are around anyone with measles, chickenpox, or if you develop sores or blisters that do not heal properly. If you are going to need surgery or other procedures, tell your doctor or health care professional that you have taken this medicine within the last 12 months. Ask your doctor or health care professional about your diet. You may need to lower the amount of salt you eat. This medicine may increase blood sugar. Ask your healthcare provider if changes in diet or medicines are needed if you have diabetes. What side effects may I notice from receiving this medicine? Side effects that you  should report to your doctor or health care professional as soon as possible:  allergic reactions like skin rash, itching or hives, swelling of the face, lips, or tongue  bloody or black, tarry stools  changes in emotions or moods  changes in vision  confusion, excitement, restlessness  depressed mood  eye pain  hallucinations  muscle weakness  severe or sudden stomach or belly pain  signs and symptoms of high blood sugar such as being more thirsty or hungry or having to urinate more than normal. You may also feel very tired or have blurry vision.  signs and symptoms of infection like fever; chills; cough; sore throat; pain or trouble passing urine  swelling of ankles, feet  unusual bruising or bleeding  wounds that do not heal Side effects that usually do not require medical attention (report to your doctor or health care professional if they continue or are bothersome):  increased appetite  increased growth of face or body hair  headache  nausea, vomiting  pain, redness, or irritation at site where injected  skin problems, acne, thin and shiny skin  trouble sleeping  weight gain This list may not describe all possible side effects. Call your doctor for medical advice about side effects. You may report side effects to FDA at 1-800-FDA-1088. Where should I keep my medicine? This medicine is given in a hospital or clinic and will not be stored at home. NOTE: This sheet is a summary. It may not cover all possible information. If you have questions about this medicine, talk to your doctor, pharmacist, or health care provider.  2020 Elsevier/Gold Standard (2018-11-24 13:51:58)

## 2020-02-01 NOTE — Progress Notes (Signed)
Hematology and Oncology Follow Up Visit  Rickey [name] [id number] April 10, 1941 79 y.o. 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 Elotuzumab/Pomalidomide/decadron -every 4 weeks- start 01/12/2018 Kyprolis/Pomalidomide/Decadron -- s/p cycle #9 -- d/c pomaldomide in 01/2019 Xpovio 80 mg po q week -- start on 12/26/2019 IVIG 40 mg IV q 6 weeks -- next dose in 02/21/2020   Interim History:  Rickey [name] is back for follow-up.  We started the Selinexor on him.  Unfortunately, he started to have diarrhea.  Imodium did not help all that much.  As such, we will go ahead and try him on some Lomotil.  He has had no problems with fever.  He has had a little bit of nausea but no vomiting.  He has had no cough.  He has had his coronavirus booster shot.  He has had no rashes.  There has been no leg swelling.  His diabetes has been under fairly good control with his insulin pump.  Overall, his performance status is ECOG one.     Medications:  Allergies as of 02/01/2020   No Known Allergies     Medication List       Accurate as of February 01, 2020 12:42 PM. If you have any questions, ask your nurse or doctor.        STOP taking these medications   amLODipine 2.5 MG tablet Commonly known as: NORVASC Stopped by: [name], MD   carfilzomib 30 MG Solr Stopped by: [name], MD   imiquimod 5 % cream Commonly known as: ALDARA Stopped by: [name], MD   tamsulosin 0.4 MG Caps capsule Commonly known as: FLOMAX Stopped by: [name], MD   telmisartan-hydrochlorothiazide 80-12.5 MG tablet Commonly known as: MICARDIS HCT Stopped by: [name], MD   zolpidem 10 MG tablet Commonly known as:  AMBIEN Stopped by: [name], MD     TAKE these medications   acyclovir 400 MG tablet Commonly known as: ZOVIRAX TAKE 1 TABLET BY MOUTH TWICE DAILY   aspirin 81 MG tablet Take 81 mg by mouth daily.   atorvastatin 10 MG tablet Commonly known as: LIPITOR Take 10 mg by mouth daily.   CALCIUM CARBONATE PO Take 220 mg by mouth 2 (two) times daily.   calcium-vitamin D 250-125 MG-UNIT tablet Commonly known as: OSCAL Take 1 tablet by mouth daily.   Cartridge Pump Misc Inject into the skin.   cycloSPORINE 0.05 % ophthalmic emulsion Commonly known as: RESTASIS Place 1 drop into both eyes 2 (two) times daily.   diphenhydramine-acetaminophen 25-500 MG Tabs tablet Commonly known as: TYLENOL PM Take 1 tablet by mouth at bedtime as needed.   fish oil-omega-3 fatty acids 1000 MG capsule Take 1 g by mouth 2 (two) times daily.   INSULIN LISP & LISP PROT (HUM) [place]   insulin lispro 100 UNIT/ML injection Commonly known as: HUMALOG Inject into the skin. Insulin pump 12/21/2018 Typically 80U daily.   irbesartan-hydrochlorothiazide 300-12.5 MG tablet Commonly known as: AVALIDE Take 1 tablet by mouth daily.   lidocaine-prilocaine cream Commonly known as: EMLA Apply 1 application topically as needed.   montelukast 10 MG tablet Commonly known as: SINGULAIR Take 10 mg by mouth at bedtime.   ondansetron  4 MG disintegrating tablet Commonly known as: ZOFRAN-ODT Take 1 tablet (4 mg total) by mouth 4 (four) times daily as needed for nausea or vomiting.   ondansetron 8 MG tablet Commonly known as: ZOFRAN Take 1 tablet (8 mg total) by mouth every 8 (eight) hours as needed for nausea or vomiting.   prochlorperazine 10 MG tablet Commonly known as: COMPAZINE TAKE 1 TABLET(10 MG) BY MOUTH EVERY 6 HOURS AS NEEDED FOR NAUSEA OR VOMITING   Vitamin D 50 MCG (2000 UT) Caps Take by mouth every morning.   Xpovio (80 MG Once Weekly) 40 MG Tbpk Generic drug: Selinexor (80 MG Once  Weekly) Take 80 mg by mouth once a week.       Allergies: No Known Allergies  Past Medical History, Surgical history, Social history, and Family History were reviewed and updated.  Review of Systems: Review of Systems  Constitutional: Negative.   HENT: Negative.   Eyes: Negative.   Respiratory: Negative.   Cardiovascular: Negative.   Gastrointestinal: Negative.   Genitourinary: Negative.   Musculoskeletal: Negative.   Skin: Negative.   Neurological: Negative.   Endo/Heme/Allergies: Negative.   Psychiatric/Behavioral: Negative.      Physical Exam:  weight is 206 lb (93.4 kg).   Wt Readings from Last 3 Encounters:  02/01/20 206 lb (93.4 kg)  01/10/20 208 lb (94.3 kg)  12/21/19 (!) 208 lb (94.3 kg)    Physical Exam Vitals reviewed.  HENT:     Head: Normocephalic and atraumatic.  Eyes:     Pupils: Pupils are equal, round, and reactive to light.  Cardiovascular:     Rate and Rhythm: Normal rate and regular rhythm.     Heart sounds: Normal heart sounds.  Pulmonary:     Effort: Pulmonary effort is normal.     Breath sounds: Normal breath sounds.  Abdominal:     General: Bowel sounds are normal.     Palpations: Abdomen is soft.  Musculoskeletal:        General: No tenderness or deformity. Normal range of motion.     Cervical back: Normal range of motion.  Lymphadenopathy:     Cervical: No cervical adenopathy.  Skin:    General: Skin is warm and dry.     Findings: No erythema or rash.  Neurological:     Mental Status: He is alert and oriented to person, place, and time.  Psychiatric:        Behavior: Behavior normal.        Thought Content: Thought content normal.        Judgment: Judgment normal.      Lab Results  Component Value Date   WBC 3.4 (L) 02/01/2020   HGB 14.8 02/01/2020   HCT 43.3 02/01/2020   MCV 102.4 (H) 02/01/2020   PLT 93 (L) 02/01/2020   Lab Results  Component Value Date   FERRITIN 258 12/28/2018   IRON 98 12/28/2018   TIBC 253  12/28/2018   UIBC 154 12/28/2018   IRONPCTSAT 39 12/28/2018   Lab Results  Component Value Date   RBC 4.23 02/01/2020   Lab Results  Component Value Date   KPAFRELGTCHN 44.9 (H) 12/21/2019   LAMBDASER 2.2 (L) 12/21/2019   KAPLAMBRATIO 20.41 (H) 12/21/2019   Lab Results  Component Value Date   IGGSERUM 851 12/21/2019   IGA 12 (L) 12/21/2019   IGMSERUM 5 (L) 12/21/2019   Lab Results  Component Value Date   TOTALPROTELP 5.8 (L) 12/21/2019   ALBUMINELP 3.2 12/21/2019  A1GS 0.2 12/21/2019   A2GS 0.6 12/21/2019   BETS 0.9 12/21/2019   BETA2SER 0.3 04/19/2015   GAMS 0.9 12/21/2019   MSPIKE Not Observed 12/21/2019   SPEI Comment 10/18/2019     Chemistry      Component Value Date/Time   NA 138 02/01/2020 1126   NA 142 04/23/2017 1431   NA 139 07/18/2016 1135   K 3.9 02/01/2020 1126   K 4.0 04/23/2017 1431   K 4.4 07/18/2016 1135   CL 107 02/01/2020 1126   CL 105 04/23/2017 1431   CO2 24 02/01/2020 1126   CO2 28 04/23/2017 1431   CO2 27 07/18/2016 1135   BUN 21 02/01/2020 1126   BUN 20 04/23/2017 1431   BUN 12.2 07/18/2016 1135   CREATININE 1.00 02/01/2020 1126   CREATININE 1.2 04/23/2017 1431   CREATININE 0.9 07/18/2016 1135      Component Value Date/Time   CALCIUM 8.9 02/01/2020 1126   CALCIUM 9.3 04/23/2017 1431   CALCIUM 8.9 07/18/2016 1135   ALKPHOS 78 02/01/2020 1126   ALKPHOS 64 04/23/2017 1431   ALKPHOS 58 07/18/2016 1135   AST 48 (H) 02/01/2020 1126   AST 15 07/18/2016 1135   ALT 59 (H) 02/01/2020 1126   ALT 22 04/23/2017 1431   ALT 20 07/18/2016 1135   BILITOT 0.8 02/01/2020 1126   BILITOT 1.24 (H) 07/18/2016 1135      Impression and Plan: Mr. 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   We will restart the Selinexor.  He will start at 40 mg a week and then go up to 80 mg a week.  Hopefully with this, he will be able to manage the diarrhea not have much  diarrhea.  He will have follow-up in another 3 weeks.    [name], MD 9/7/202112:42 PM

## 2020-02-02 ENCOUNTER — Ambulatory Visit: Payer: Medicare Other | Admitting: Hematology & Oncology

## 2020-02-02 ENCOUNTER — Other Ambulatory Visit: Payer: Medicare Other

## 2020-02-02 ENCOUNTER — Ambulatory Visit: Payer: Medicare Other

## 2020-02-02 LAB — KAPPA/LAMBDA LIGHT CHAINS
Kappa free light chain: 62.5 mg/L — ABNORMAL HIGH (ref 3.3–19.4)
Kappa, lambda light chain ratio: 7.18 — ABNORMAL HIGH (ref 0.26–1.65)
Lambda free light chains: 8.7 mg/L (ref 5.7–26.3)

## 2020-02-02 LAB — IGG, IGA, IGM
IgA: 66 mg/dL (ref 61–437)
IgG (Immunoglobin G), Serum: 717 mg/dL (ref 603–1613)
IgM (Immunoglobulin M), Srm: 10 mg/dL — ABNORMAL LOW (ref 15–143)

## 2020-02-02 LAB — LACTATE DEHYDROGENASE: LDH: 218 U/L — ABNORMAL HIGH (ref 98–192)

## 2020-02-03 LAB — PROTEIN ELECTROPHORESIS, SERUM, WITH REFLEX
A/G Ratio: 1.5 (ref 0.7–1.7)
Albumin ELP: 3.3 g/dL (ref 2.9–4.4)
Alpha-1-Globulin: 0.3 g/dL (ref 0.0–0.4)
Alpha-2-Globulin: 0.6 g/dL (ref 0.4–1.0)
Beta Globulin: 0.6 g/dL — ABNORMAL LOW (ref 0.7–1.3)
Gamma Globulin: 0.7 g/dL (ref 0.4–1.8)
Globulin, Total: 2.2 g/dL (ref 2.2–3.9)
Total Protein ELP: 5.5 g/dL — ABNORMAL LOW (ref 6.0–8.5)

## 2020-02-21 ENCOUNTER — Inpatient Hospital Stay: Payer: Medicare Other

## 2020-02-21 ENCOUNTER — Encounter: Payer: Self-pay | Admitting: Hematology & Oncology

## 2020-02-21 ENCOUNTER — Other Ambulatory Visit: Payer: Self-pay | Admitting: *Deleted

## 2020-02-21 ENCOUNTER — Inpatient Hospital Stay: Payer: Medicare Other | Admitting: Hematology & Oncology

## 2020-02-21 ENCOUNTER — Inpatient Hospital Stay (HOSPITAL_BASED_OUTPATIENT_CLINIC_OR_DEPARTMENT_OTHER): Payer: Medicare Other | Admitting: Hematology & Oncology

## 2020-02-21 ENCOUNTER — Ambulatory Visit: Payer: Medicare Other | Admitting: Family

## 2020-02-21 ENCOUNTER — Other Ambulatory Visit: Payer: Medicare Other

## 2020-02-21 ENCOUNTER — Other Ambulatory Visit: Payer: Self-pay

## 2020-02-21 ENCOUNTER — Ambulatory Visit: Payer: Medicare Other

## 2020-02-21 VITALS — BP 116/50 | HR 68 | Temp 98.1°F | Resp 18 | Wt 200.5 lb

## 2020-02-21 VITALS — BP 135/68 | HR 85 | Temp 98.4°F | Resp 18

## 2020-02-21 DIAGNOSIS — C9000 Multiple myeloma not having achieved remission: Secondary | ICD-10-CM

## 2020-02-21 DIAGNOSIS — N1832 Chronic kidney disease, stage 3b: Secondary | ICD-10-CM

## 2020-02-21 DIAGNOSIS — E1021 Type 1 diabetes mellitus with diabetic nephropathy: Secondary | ICD-10-CM

## 2020-02-21 DIAGNOSIS — E119 Type 2 diabetes mellitus without complications: Secondary | ICD-10-CM | POA: Diagnosis not present

## 2020-02-21 DIAGNOSIS — Z5112 Encounter for antineoplastic immunotherapy: Secondary | ICD-10-CM | POA: Diagnosis not present

## 2020-02-21 DIAGNOSIS — E1022 Type 1 diabetes mellitus with diabetic chronic kidney disease: Secondary | ICD-10-CM

## 2020-02-21 DIAGNOSIS — R197 Diarrhea, unspecified: Secondary | ICD-10-CM | POA: Diagnosis not present

## 2020-02-21 DIAGNOSIS — C9002 Multiple myeloma in relapse: Secondary | ICD-10-CM | POA: Diagnosis not present

## 2020-02-21 LAB — CBC WITH DIFFERENTIAL (CANCER CENTER ONLY)
Abs Immature Granulocytes: 0 10*3/uL (ref 0.00–0.07)
Basophils Absolute: 0 10*3/uL (ref 0.0–0.1)
Basophils Relative: 1 %
Eosinophils Absolute: 0 10*3/uL (ref 0.0–0.5)
Eosinophils Relative: 1 %
HCT: 44.9 % (ref 39.0–52.0)
Hemoglobin: 15.9 g/dL (ref 13.0–17.0)
Immature Granulocytes: 0 %
Lymphocytes Relative: 37 %
Lymphs Abs: 1.3 10*3/uL (ref 0.7–4.0)
MCH: 36 pg — ABNORMAL HIGH (ref 26.0–34.0)
MCHC: 35.4 g/dL (ref 30.0–36.0)
MCV: 101.6 fL — ABNORMAL HIGH (ref 80.0–100.0)
Monocytes Absolute: 0.5 10*3/uL (ref 0.1–1.0)
Monocytes Relative: 13 %
Neutro Abs: 1.7 10*3/uL (ref 1.7–7.7)
Neutrophils Relative %: 48 %
Platelet Count: 93 10*3/uL — ABNORMAL LOW (ref 150–400)
RBC: 4.42 MIL/uL (ref 4.22–5.81)
RDW: 13.2 % (ref 11.5–15.5)
WBC Count: 3.5 10*3/uL — ABNORMAL LOW (ref 4.0–10.5)
nRBC: 0.6 % — ABNORMAL HIGH (ref 0.0–0.2)

## 2020-02-21 LAB — CMP (CANCER CENTER ONLY)
ALT: 33 U/L (ref 0–44)
AST: 31 U/L (ref 15–41)
Albumin: 4 g/dL (ref 3.5–5.0)
Alkaline Phosphatase: 50 U/L (ref 38–126)
Anion gap: 7 (ref 5–15)
BUN: 28 mg/dL — ABNORMAL HIGH (ref 8–23)
CO2: 24 mmol/L (ref 22–32)
Calcium: 9 mg/dL (ref 8.9–10.3)
Chloride: 104 mmol/L (ref 98–111)
Creatinine: 1.12 mg/dL (ref 0.61–1.24)
GFR, Est AFR Am: >60 mL/min (ref >60)
GFR, Estimated: >60 mL/min (ref >60)
Glucose, Bld: 143 mg/dL — ABNORMAL HIGH (ref 70–99)
Potassium: 4.1 mmol/L (ref 3.5–5.1)
Sodium: 135 mmol/L (ref 135–145)
Total Bilirubin: 1.1 mg/dL (ref 0.3–1.2)
Total Protein: 6.1 g/dL — ABNORMAL LOW (ref 6.5–8.1)

## 2020-02-21 LAB — HEMOGLOBIN A1C
Hgb A1c MFr Bld: 5.8 % — ABNORMAL HIGH (ref 4.8–5.6)
Mean Plasma Glucose: 119.76 mg/dL

## 2020-02-21 MED ORDER — DEXTROSE 5 % IV SOLN
70.0000 mg/m2 | Freq: Once | INTRAVENOUS | Status: AC
  Administered 2020-02-21: 150 mg via INTRAVENOUS
  Filled 2020-02-21: qty 60

## 2020-02-21 MED ORDER — SODIUM CHLORIDE 0.9 % IV SOLN
20.0000 mg | Freq: Once | INTRAVENOUS | Status: AC
  Administered 2020-02-21: 20 mg via INTRAVENOUS
  Filled 2020-02-21: qty 20

## 2020-02-21 MED ORDER — DIPHENHYDRAMINE HCL 25 MG PO CAPS
ORAL_CAPSULE | ORAL | Status: AC
  Filled 2020-02-21: qty 2

## 2020-02-21 MED ORDER — HEPARIN SOD (PORK) LOCK FLUSH 100 UNIT/ML IV SOLN
500.0000 [IU] | Freq: Once | INTRAVENOUS | Status: AC | PRN
  Administered 2020-02-21: 500 [IU]
  Filled 2020-02-21: qty 5

## 2020-02-21 MED ORDER — PROCHLORPERAZINE MALEATE 10 MG PO TABS
10.0000 mg | ORAL_TABLET | Freq: Once | ORAL | Status: AC
  Administered 2020-02-21: 10 mg via ORAL

## 2020-02-21 MED ORDER — SODIUM CHLORIDE 0.9 % IV SOLN
Freq: Once | INTRAVENOUS | Status: AC
  Filled 2020-02-21: qty 250

## 2020-02-21 MED ORDER — DIPHENHYDRAMINE HCL 25 MG PO CAPS
50.0000 mg | ORAL_CAPSULE | Freq: Once | ORAL | Status: AC
  Administered 2020-02-21: 50 mg via ORAL

## 2020-02-21 MED ORDER — IMMUNE GLOBULIN (HUMAN) 20 GM/200ML IV SOLN
40.0000 g | Freq: Once | INTRAVENOUS | Status: AC
  Administered 2020-02-21: 40 g via INTRAVENOUS
  Filled 2020-02-21: qty 400

## 2020-02-21 MED ORDER — ACETAMINOPHEN 325 MG PO TABS
ORAL_TABLET | ORAL | Status: AC
  Filled 2020-02-21: qty 2

## 2020-02-21 MED ORDER — PROCHLORPERAZINE MALEATE 10 MG PO TABS
ORAL_TABLET | ORAL | Status: AC
  Filled 2020-02-21: qty 1

## 2020-02-21 MED ORDER — SODIUM CHLORIDE 0.9% FLUSH
10.0000 mL | INTRAVENOUS | Status: DC | PRN
  Administered 2020-02-21: 10 mL
  Filled 2020-02-21: qty 10

## 2020-02-21 MED ORDER — ACETAMINOPHEN 325 MG PO TABS
650.0000 mg | ORAL_TABLET | Freq: Once | ORAL | Status: AC
  Administered 2020-02-21: 650 mg via ORAL

## 2020-02-21 NOTE — Progress Notes (Signed)
Hematology and Oncology Follow Up Visit  Rickey [name] [id number] [birth year] 79 y.o. 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 Elotuzumab/Pomalidomide/decadron -every 4 weeks- start 01/12/2018 Kyprolis/Pomalidomide/Decadron -- s/p cycle #9 -- d/c pomaldomide in 01/2019 Xpovio 80 mg po q week -- start on 12/26/2019 IVIG 40 mg IV q 6 weeks -- next dose in 02/21/2020   Interim History:  Rickey [name] is back for follow-up.  Overall, he is doing little better.  We had to hold the Selinexor because of diarrhea.  He restarted at a lower dose and now is up to 80 mg a day.  His last kappa light chain was up to 63 mg/L.  We will have to see what this level is.  If it is even higher, we just may go ahead and try to get him onto a CAR-T protocol.  I probably would see about sending him down to the [facility] in [place] where they have a very active protocol program for myeloma.  [place]  [place] are up there at their not home.  This morning he said it was 43 degrees.  He has had no problems with nausea.  He has had no bleeding.  His blood sugars have been under decent control.  He has been fairly active.  He has had no problems with pain.    Overall, his performance status is ECOG one.     Medications:  Allergies as of 02/21/2020   No Known Allergies     Medication List       Accurate as of February 21, 2020 10:14 AM. If you have any questions, ask your nurse or doctor.        acyclovir 400 MG tablet Commonly known as: ZOVIRAX TAKE 1 TABLET BY MOUTH TWICE DAILY   aspirin 81 MG tablet Take 81 mg by mouth daily.   atorvastatin 10 MG tablet Commonly known as: LIPITOR Take  10 mg by mouth daily.   CALCIUM CARBONATE PO Take 220 mg by mouth 2 (two) times daily.   calcium-vitamin D 250-125 MG-UNIT tablet Commonly known as: OSCAL Take 1 tablet by mouth daily.   Cartridge Pump Misc Inject into the skin.   cycloSPORINE 0.05 % ophthalmic emulsion Commonly known as: RESTASIS Place 1 drop into both eyes 2 (two) times daily.   diphenhydramine-acetaminophen 25-500 MG Tabs tablet Commonly known as: TYLENOL PM Take 1 tablet by mouth at bedtime as needed.   diphenoxylate-atropine 2.5-0.025 MG tablet Commonly known as: LOMOTIL Take 1 tablet by mouth 4 (four) times daily as needed for diarrhea or loose stools.   fish oil-omega-3 fatty acids 1000 MG capsule Take 1 g by mouth 2 (two) times daily.   INSULIN LISP & LISP PROT (HUM) [place]   insulin lispro 100 UNIT/ML injection Commonly known as: HUMALOG Inject into the skin. Insulin pump 12/21/2018 Typically 80U daily.   irbesartan-hydrochlorothiazide 300-12.5 MG tablet Commonly known as: AVALIDE Take 1 tablet by mouth daily.   lidocaine-prilocaine cream Commonly known as: EMLA Apply 1 application topically as needed.   montelukast 10 MG tablet Commonly known as: SINGULAIR Take 10 mg by mouth at bedtime.   ondansetron 4 MG disintegrating tablet Commonly known as: ZOFRAN-ODT Take  1 tablet (4 mg total) by mouth 4 (four) times daily as needed for nausea or vomiting.   ondansetron 8 MG tablet Commonly known as: ZOFRAN Take 1 tablet (8 mg total) by mouth every 8 (eight) hours as needed for nausea or vomiting.   prochlorperazine 10 MG tablet Commonly known as: COMPAZINE TAKE 1 TABLET(10 MG) BY MOUTH EVERY 6 HOURS AS NEEDED FOR NAUSEA OR VOMITING   Vitamin D 50 MCG (2000 UT) Caps Take by mouth every morning.   Xpovio (80 MG Once Weekly) 40 MG Tbpk Generic drug: selinexor Take 80 mg by mouth once a week.       Allergies: No Known Allergies  Past Medical History, Surgical history, Social history, and  Family History were reviewed and updated.  Review of Systems: Review of Systems  Constitutional: Negative.   HENT: Negative.   Eyes: Negative.   Respiratory: Negative.   Cardiovascular: Negative.   Gastrointestinal: Negative.   Genitourinary: Negative.   Musculoskeletal: Negative.   Skin: Negative.   Neurological: Negative.   Endo/Heme/Allergies: Negative.   Psychiatric/Behavioral: Negative.      Physical Exam:  weight is 200 lb 8 oz (90.9 kg). His oral temperature is 98.1 F (36.7 C). His blood pressure is 116/50 (abnormal) and his pulse is 68. His respiration is 18 and oxygen saturation is 99%.   Wt Readings from Last 3 Encounters:  02/21/20 200 lb 8 oz (90.9 kg)  02/01/20 206 lb (93.4 kg)  01/10/20 208 lb (94.3 kg)    Physical Exam Vitals reviewed.  HENT:     Head: Normocephalic and atraumatic.  Eyes:     Pupils: Pupils are equal, round, and reactive to light.  Cardiovascular:     Rate and Rhythm: Normal rate and regular rhythm.     Heart sounds: Normal heart sounds.  Pulmonary:     Effort: Pulmonary effort is normal.     Breath sounds: Normal breath sounds.  Abdominal:     General: Bowel sounds are normal.     Palpations: Abdomen is soft.  Musculoskeletal:        General: No tenderness or deformity. Normal range of motion.     Cervical back: Normal range of motion.  Lymphadenopathy:     Cervical: No cervical adenopathy.  Skin:    General: Skin is warm and dry.     Findings: No erythema or rash.  Neurological:     Mental Status: He is alert and oriented to person, place, and time.  Psychiatric:        Behavior: Behavior normal.        Thought Content: Thought content normal.        Judgment: Judgment normal.    Lab Results  Component Value Date   WBC 3.5 (L) 02/21/2020   HGB 15.9 02/21/2020   HCT 44.9 02/21/2020   MCV 101.6 (H) 02/21/2020   PLT 93 (L) 02/21/2020   Lab Results  Component Value Date   FERRITIN 258 12/28/2018   IRON 98 12/28/2018    TIBC 253 12/28/2018   UIBC 154 12/28/2018   IRONPCTSAT 39 12/28/2018   Lab Results  Component Value Date   RBC 4.42 02/21/2020   Lab Results  Component Value Date   KPAFRELGTCHN 62.5 (H) 02/01/2020   LAMBDASER 8.7 02/01/2020   KAPLAMBRATIO 7.18 (H) 02/01/2020   Lab Results  Component Value Date   IGGSERUM 717 02/01/2020   IGA 66 02/01/2020   IGMSERUM 10 (L) 02/01/2020   Lab Results  Component  Value Date   TOTALPROTELP 5.5 (L) 02/01/2020   ALBUMINELP 3.3 02/01/2020   A1GS 0.3 02/01/2020   A2GS 0.6 02/01/2020   BETS 0.6 (L) 02/01/2020   BETA2SER 0.3 04/19/2015   GAMS 0.7 02/01/2020   MSPIKE Not Observed 02/01/2020   SPEI Comment 10/18/2019     Chemistry      Component Value Date/Time   NA 135 02/21/2020 0920   NA 142 04/23/2017 1431   NA 139 07/18/2016 1135   K 4.1 02/21/2020 0920   K 4.0 04/23/2017 1431   K 4.4 07/18/2016 1135   CL 104 02/21/2020 0920   CL 105 04/23/2017 1431   CO2 24 02/21/2020 0920   CO2 28 04/23/2017 1431   CO2 27 07/18/2016 1135   BUN 28 (H) 02/21/2020 0920   BUN 20 04/23/2017 1431   BUN 12.2 07/18/2016 1135   CREATININE 1.12 02/21/2020 0920   CREATININE 1.2 04/23/2017 1431   CREATININE 0.9 07/18/2016 1135      Component Value Date/Time   CALCIUM 9.0 02/21/2020 0920   CALCIUM 9.3 04/23/2017 1431   CALCIUM 8.9 07/18/2016 1135   ALKPHOS 50 02/21/2020 0920   ALKPHOS 64 04/23/2017 1431   ALKPHOS 58 07/18/2016 1135   AST 31 02/21/2020 0920   AST 15 07/18/2016 1135   ALT 33 02/21/2020 0920   ALT 22 04/23/2017 1431   ALT 20 07/18/2016 1135   BILITOT 1.1 02/21/2020 0920   BILITOT 1.24 (H) 07/18/2016 1135      Impression and Plan: Rickey [name] is a very pleasant 79 yo caucasian gentleman with 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   We will hopefully be able to continue the Selinexor.  If the kappa light chain is not that much elevated, we just may stick with the Selinexor.  He cuts his Kyprolis  today.  We will plan to get him back to see [place] in another couple weeks.     [name], MD 9/27/202110:14 AM

## 2020-02-21 NOTE — Progress Notes (Signed)
MD reviewed cbc and cmet-Ok to treat despite labs  

## 2020-02-21 NOTE — Addendum Note (Signed)
Addended by: [name] on: 02/21/2020 01:26 PM   Modules accepted: Orders

## 2020-02-21 NOTE — Patient Instructions (Signed)
[facility] Discharge Instructions for Patients Receiving Chemotherapy  Today you received the following chemotherapy agents Kyprolis  To help prevent nausea and vomiting after your treatment, we encourage you to take your nausea medication as prescribed by MD.   If you develop nausea and vomiting that is not controlled by your nausea medication, call the clinic.   BELOW ARE SYMPTOMS THAT SHOULD BE REPORTED IMMEDIATELY:  *FEVER GREATER THAN 100.5 F  *CHILLS WITH OR WITHOUT FEVER  NAUSEA AND VOMITING THAT IS NOT CONTROLLED WITH YOUR NAUSEA MEDICATION  *UNUSUAL SHORTNESS OF BREATH  *UNUSUAL BRUISING OR BLEEDING  TENDERNESS IN MOUTH AND THROAT WITH OR WITHOUT PRESENCE OF ULCERS  *URINARY PROBLEMS  *BOWEL PROBLEMS  UNUSUAL RASH Items with * indicate a potential emergency and should be followed up as soon as possible.  Feel free to call the clinic should you have any questions or concerns. The clinic phone number is [phone].  Please show the CHEMO ALERT CARD at check-in to the Emergency Department and triage nurse.   

## 2020-02-22 ENCOUNTER — Other Ambulatory Visit: Payer: Medicare Other

## 2020-02-22 ENCOUNTER — Ambulatory Visit: Payer: Medicare Other | Admitting: Hematology & Oncology

## 2020-02-22 ENCOUNTER — Ambulatory Visit: Payer: Medicare Other

## 2020-02-22 LAB — PROTEIN ELECTROPHORESIS, SERUM, WITH REFLEX
A/G Ratio: 1.8 — ABNORMAL HIGH (ref 0.7–1.7)
Albumin ELP: 3.8 g/dL (ref 2.9–4.4)
Alpha-1-Globulin: 0.2 g/dL (ref 0.0–0.4)
Alpha-2-Globulin: 0.7 g/dL (ref 0.4–1.0)
Beta Globulin: 0.8 g/dL (ref 0.7–1.3)
Gamma Globulin: 0.5 g/dL (ref 0.4–1.8)
Globulin, Total: 2.1 g/dL — ABNORMAL LOW (ref 2.2–3.9)
Total Protein ELP: 5.9 g/dL — ABNORMAL LOW (ref 6.0–8.5)

## 2020-02-22 LAB — KAPPA/LAMBDA LIGHT CHAINS
Kappa free light chain: 50.8 mg/L — ABNORMAL HIGH (ref 3.3–19.4)
Kappa, lambda light chain ratio: 23.09 — ABNORMAL HIGH (ref 0.26–1.65)
Lambda free light chains: 2.2 mg/L — ABNORMAL LOW (ref 5.7–26.3)

## 2020-02-22 LAB — IGG, IGA, IGM
IgA: 17 mg/dL — ABNORMAL LOW (ref 61–437)
IgG (Immunoglobin G), Serum: 607 mg/dL (ref 603–1613)
IgM (Immunoglobulin M), Srm: 10 mg/dL — ABNORMAL LOW (ref 15–143)

## 2020-02-23 ENCOUNTER — Telehealth: Payer: Self-pay

## 2020-02-23 NOTE — Telephone Encounter (Signed)
Called patient and informed him of light chain results. Patient verbalized understanding, inquired is he thought he would still have to switch to CAR-T as he mentioned in his last visit. Reviewed Dr.Ennevers note. Informed patient according to his note he was going to potentially start CAR-T if his light chains were elevated and if they were lower or not much elevated keep him on Selinexor and this would also be discussed again at his next visit. Patient verbalized understanding and denies any other questions or concerns at this time.

## 2020-02-23 NOTE — Telephone Encounter (Signed)
-----   Message from [name], MD sent at 02/23/2020  6:14 AM EDT ----- Call - the light chain is lower!!!  It went from, 63 to 50.  Laurey Arrow

## 2020-03-06 ENCOUNTER — Ambulatory Visit: Payer: Medicare Other | Admitting: Family

## 2020-03-06 ENCOUNTER — Ambulatory Visit: Payer: Medicare Other

## 2020-03-06 ENCOUNTER — Other Ambulatory Visit: Payer: Medicare Other

## 2020-03-13 ENCOUNTER — Inpatient Hospital Stay: Payer: Medicare Other

## 2020-03-13 ENCOUNTER — Ambulatory Visit: Payer: Medicare Other | Admitting: Family

## 2020-03-13 ENCOUNTER — Other Ambulatory Visit: Payer: Medicare Other

## 2020-03-13 ENCOUNTER — Encounter: Payer: Self-pay | Admitting: Family

## 2020-03-13 ENCOUNTER — Other Ambulatory Visit: Payer: Self-pay

## 2020-03-13 ENCOUNTER — Ambulatory Visit: Payer: Medicare Other

## 2020-03-13 ENCOUNTER — Inpatient Hospital Stay (HOSPITAL_BASED_OUTPATIENT_CLINIC_OR_DEPARTMENT_OTHER): Payer: Medicare Other | Admitting: Family

## 2020-03-13 ENCOUNTER — Inpatient Hospital Stay: Payer: Medicare Other | Attending: Hematology & Oncology

## 2020-03-13 VITALS — BP 124/56 | HR 68 | Temp 98.0°F | Resp 18 | Wt 208.0 lb

## 2020-03-13 DIAGNOSIS — C9000 Multiple myeloma not having achieved remission: Secondary | ICD-10-CM

## 2020-03-13 DIAGNOSIS — C9002 Multiple myeloma in relapse: Secondary | ICD-10-CM | POA: Diagnosis not present

## 2020-03-13 DIAGNOSIS — D801 Nonfamilial hypogammaglobulinemia: Secondary | ICD-10-CM | POA: Diagnosis not present

## 2020-03-13 DIAGNOSIS — Z5112 Encounter for antineoplastic immunotherapy: Secondary | ICD-10-CM | POA: Diagnosis not present

## 2020-03-13 LAB — CBC WITH DIFFERENTIAL (CANCER CENTER ONLY)
Abs Immature Granulocytes: 0 10*3/uL (ref 0.00–0.07)
Basophils Absolute: 0 10*3/uL (ref 0.0–0.1)
Basophils Relative: 1 %
Eosinophils Absolute: 0 10*3/uL (ref 0.0–0.5)
Eosinophils Relative: 1 %
HCT: 42.4 % (ref 39.0–52.0)
Hemoglobin: 14.8 g/dL (ref 13.0–17.0)
Immature Granulocytes: 0 %
Lymphocytes Relative: 41 %
Lymphs Abs: 1 10*3/uL (ref 0.7–4.0)
MCH: 35.9 pg — ABNORMAL HIGH (ref 26.0–34.0)
MCHC: 34.9 g/dL (ref 30.0–36.0)
MCV: 102.9 fL — ABNORMAL HIGH (ref 80.0–100.0)
Monocytes Absolute: 0.3 10*3/uL (ref 0.1–1.0)
Monocytes Relative: 13 %
Neutro Abs: 1.2 10*3/uL — ABNORMAL LOW (ref 1.7–7.7)
Neutrophils Relative %: 44 %
Platelet Count: 74 10*3/uL — ABNORMAL LOW (ref 150–400)
RBC: 4.12 MIL/uL — ABNORMAL LOW (ref 4.22–5.81)
RDW: 13.5 % (ref 11.5–15.5)
WBC Count: 2.5 10*3/uL — ABNORMAL LOW (ref 4.0–10.5)
nRBC: 2 % — ABNORMAL HIGH (ref 0.0–0.2)

## 2020-03-13 LAB — CMP (CANCER CENTER ONLY)
ALT: 35 U/L (ref 0–44)
AST: 34 U/L (ref 15–41)
Albumin: 4 g/dL (ref 3.5–5.0)
Alkaline Phosphatase: 48 U/L (ref 38–126)
Anion gap: 7 (ref 5–15)
BUN: 21 mg/dL (ref 8–23)
CO2: 23 mmol/L (ref 22–32)
Calcium: 8.9 mg/dL (ref 8.9–10.3)
Chloride: 105 mmol/L (ref 98–111)
Creatinine: 1.05 mg/dL (ref 0.61–1.24)
GFR, Estimated: >60 mL/min (ref >60)
Glucose, Bld: 148 mg/dL — ABNORMAL HIGH (ref 70–99)
Potassium: 4.1 mmol/L (ref 3.5–5.1)
Sodium: 135 mmol/L (ref 135–145)
Total Bilirubin: 1 mg/dL (ref 0.3–1.2)
Total Protein: 6.3 g/dL — ABNORMAL LOW (ref 6.5–8.1)

## 2020-03-13 LAB — LACTATE DEHYDROGENASE: LDH: 216 U/L — ABNORMAL HIGH (ref 98–192)

## 2020-03-13 MED ORDER — SODIUM CHLORIDE 0.9 % IV SOLN
Freq: Once | INTRAVENOUS | Status: AC
  Filled 2020-03-13: qty 250

## 2020-03-13 MED ORDER — HEPARIN SOD (PORK) LOCK FLUSH 100 UNIT/ML IV SOLN
500.0000 [IU] | Freq: Once | INTRAVENOUS | Status: AC | PRN
  Administered 2020-03-13: 500 [IU]
  Filled 2020-03-13: qty 5

## 2020-03-13 MED ORDER — DEXTROSE 5 % IV SOLN
70.0000 mg/m2 | Freq: Once | INTRAVENOUS | Status: AC
  Administered 2020-03-13: 150 mg via INTRAVENOUS
  Filled 2020-03-13: qty 60

## 2020-03-13 MED ORDER — SODIUM CHLORIDE 0.9 % IV SOLN
20.0000 mg | Freq: Once | INTRAVENOUS | Status: AC
  Administered 2020-03-13: 20 mg via INTRAVENOUS
  Filled 2020-03-13: qty 2

## 2020-03-13 MED ORDER — DIPHENHYDRAMINE HCL 25 MG PO CAPS
ORAL_CAPSULE | ORAL | Status: AC
  Filled 2020-03-13: qty 2

## 2020-03-13 MED ORDER — PROCHLORPERAZINE MALEATE 10 MG PO TABS
10.0000 mg | ORAL_TABLET | Freq: Once | ORAL | Status: AC
  Administered 2020-03-13: 10 mg via ORAL

## 2020-03-13 MED ORDER — ACETAMINOPHEN 325 MG PO TABS
ORAL_TABLET | ORAL | Status: AC
  Filled 2020-03-13: qty 2

## 2020-03-13 MED ORDER — DIPHENHYDRAMINE HCL 25 MG PO CAPS
50.0000 mg | ORAL_CAPSULE | Freq: Once | ORAL | Status: AC
  Administered 2020-03-13: 50 mg via ORAL

## 2020-03-13 MED ORDER — SODIUM CHLORIDE 0.9% FLUSH
10.0000 mL | INTRAVENOUS | Status: DC | PRN
  Administered 2020-03-13: 10 mL
  Filled 2020-03-13: qty 10

## 2020-03-13 MED ORDER — PROCHLORPERAZINE MALEATE 10 MG PO TABS
ORAL_TABLET | ORAL | Status: AC
  Filled 2020-03-13: qty 1

## 2020-03-13 MED ORDER — ACETAMINOPHEN 325 MG PO TABS
650.0000 mg | ORAL_TABLET | Freq: Once | ORAL | Status: AC
  Administered 2020-03-13: 650 mg via ORAL

## 2020-03-13 NOTE — Progress Notes (Signed)
Reviewed all labwork with Dr Marin Olp.  Ok to treat today  Plts 74 and ANC 1.2.  Ok to treat today

## 2020-03-13 NOTE — Progress Notes (Addendum)
Hematology and Oncology Follow Up Visit  Rickey [name] [id number] June 16, 1940 79 y.o. 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 Elotuzumab/Pomalidomide/decadron -every 4 weeks- start 01/12/2018  Current Therapy:        Kyprolis/Pomalidomide/Decadron -- s/p cycle 12 -- d/c pomaldomide in 01/2019 Xpovio 80 mg po q week -- start on 12/26/2019 IVIG 40 mg IV q 6 weeks -- next dose in 04/03/2020   Interim History:  Rickey [name] is here today for follow-up and treatment. He is doing well but notes consistent fatigue.  He has restarted the Selinexor and has not had any issue with diarrhea. He is taking his lomotil as advised.  He has mild SOB with over exertion and will take breaks to rest when needed.  No episodes of bleeding. No abnormal bruising, no petechiae.  No M-spike last month, IgG level was 607 mg/dL and kappa light chains 5.08 mg/dL.  No fever, chills, n/v, cough, rash, dizziness, SOB< chest pain, palpitations, abdominal pain or changes in bowel or bladder habits.  No swelling, tenderness, numbness or tingling in his extremities.  No falls or syncope.  Appetite comes and goes. He states that he needs to better hydrate throughout the day.   ECOG Performance Status: 1 - Symptomatic but completely ambulatory  Medications:  Allergies as of 03/13/2020   No Known Allergies     Medication List       Accurate as of March 13, 2020 11:02 AM. If you have any questions, ask your nurse or doctor.        acyclovir 400 MG tablet Commonly known as: ZOVIRAX TAKE 1 TABLET BY MOUTH TWICE DAILY   aspirin 81 MG tablet Take 81 mg by mouth daily.   atorvastatin 10 MG tablet Commonly known as: LIPITOR Take 10 mg by mouth daily.    CALCIUM CARBONATE PO Take 220 mg by mouth 2 (two) times daily.   calcium-vitamin D 250-125 MG-UNIT tablet Commonly known as: OSCAL Take 1 tablet by mouth daily.   Cartridge Pump Misc Inject into the skin.   cycloSPORINE 0.05 % ophthalmic emulsion Commonly known as: RESTASIS Place 1 drop into both eyes 2 (two) times daily.   diphenhydramine-acetaminophen 25-500 MG Tabs tablet Commonly known as: TYLENOL PM Take 1 tablet by mouth at bedtime as needed.   diphenoxylate-atropine 2.5-0.025 MG tablet Commonly known as: LOMOTIL Take 1 tablet by mouth 4 (four) times daily as needed for diarrhea or loose stools.   fish oil-omega-3 fatty acids 1000 MG capsule Take 1 g by mouth 2 (two) times daily.   INSULIN LISP & LISP PROT (HUM) [place]   insulin lispro 100 UNIT/ML injection Commonly known as: HUMALOG Inject into the skin. Insulin pump 12/21/2018 Typically 80U daily.   irbesartan-hydrochlorothiazide 300-12.5 MG tablet Commonly known as: AVALIDE Take 1 tablet by mouth daily.   lidocaine-prilocaine cream Commonly known as: EMLA Apply 1 application topically as needed.   montelukast 10 MG tablet Commonly known as: SINGULAIR Take 10 mg by mouth at bedtime.   ondansetron 4 MG disintegrating tablet Commonly known as: ZOFRAN-ODT Take 1 tablet (4 mg total) by mouth 4 (four) times daily as needed for nausea or vomiting.   ondansetron 8 MG tablet Commonly known as: ZOFRAN Take 1 tablet (8 mg total)  by mouth every 8 (eight) hours as needed for nausea or vomiting.   prochlorperazine 10 MG tablet Commonly known as: COMPAZINE TAKE 1 TABLET(10 MG) BY MOUTH EVERY 6 HOURS AS NEEDED FOR NAUSEA OR VOMITING   Vitamin D 50 MCG (2000 UT) Caps Take by mouth every morning.   Xpovio (80 MG Once Weekly) 40 MG Tbpk Generic drug: selinexor Take 80 mg by mouth once a week.       Allergies: No Known Allergies  Past Medical History, Surgical history, Social history, and Family History were  reviewed and updated.  Review of Systems: All other 10 point review of systems is negative.   Physical Exam:  weight is 208 lb (94.3 kg). His oral temperature is 98 F (36.7 C). His blood pressure is 124/56 (abnormal) and his pulse is 68. His respiration is 18 and oxygen saturation is 99%.   Wt Readings from Last 3 Encounters:  03/13/20 208 lb (94.3 kg)  02/21/20 200 lb 8 oz (90.9 kg)  02/01/20 206 lb (93.4 kg)    Ocular: Sclerae unicteric, pupils equal, round and reactive to light Ear-nose-throat: Oropharynx clear, dentition fair Lymphatic: No cervical, supraclavicular or axillary adenopathy Lungs no rales or rhonchi, good excursion bilaterally Heart regular rate and rhythm, no murmur appreciated Abd soft, nontender, positive bowel sounds, no liver or spleen tip palpated on exam, no fluid wave  MSK no focal spinal tenderness, no joint edema Neuro: non-focal, well-oriented, appropriate affect Breasts: Deferred   Lab Results  Component Value Date   WBC 2.5 (L) 03/13/2020   HGB 14.8 03/13/2020   HCT 42.4 03/13/2020   MCV 102.9 (H) 03/13/2020   PLT 74 (L) 03/13/2020   Lab Results  Component Value Date   FERRITIN 258 12/28/2018   IRON 98 12/28/2018   TIBC 253 12/28/2018   UIBC 154 12/28/2018   IRONPCTSAT 39 12/28/2018   Lab Results  Component Value Date   RBC 4.12 (L) 03/13/2020   Lab Results  Component Value Date   KPAFRELGTCHN 50.8 (H) 02/21/2020   LAMBDASER 2.2 (L) 02/21/2020   KAPLAMBRATIO 23.09 (H) 02/21/2020   Lab Results  Component Value Date   IGGSERUM 607 02/21/2020   IGA 17 (L) 02/21/2020   IGMSERUM 10 (L) 02/21/2020   Lab Results  Component Value Date   TOTALPROTELP 5.9 (L) 02/21/2020   ALBUMINELP 3.8 02/21/2020   A1GS 0.2 02/21/2020   A2GS 0.7 02/21/2020   BETS 0.8 02/21/2020   BETA2SER 0.3 04/19/2015   GAMS 0.5 02/21/2020   MSPIKE Not Observed 02/21/2020   SPEI Comment 10/18/2019     Chemistry      Component Value Date/Time   NA 135  02/21/2020 0920   NA 142 04/23/2017 1431   NA 139 07/18/2016 1135   K 4.1 02/21/2020 0920   K 4.0 04/23/2017 1431   K 4.4 07/18/2016 1135   CL 104 02/21/2020 0920   CL 105 04/23/2017 1431   CO2 24 02/21/2020 0920   CO2 28 04/23/2017 1431   CO2 27 07/18/2016 1135   BUN 28 (H) 02/21/2020 0920   BUN 20 04/23/2017 1431   BUN 12.2 07/18/2016 1135   CREATININE 1.12 02/21/2020 0920   CREATININE 1.2 04/23/2017 1431   CREATININE 0.9 07/18/2016 1135      Component Value Date/Time   CALCIUM 9.0 02/21/2020 0920   CALCIUM 9.3 04/23/2017 1431   CALCIUM 8.9 07/18/2016 1135   ALKPHOS 50 02/21/2020 0920   ALKPHOS 64 04/23/2017 1431   ALKPHOS 58  07/18/2016 1135   AST 31 02/21/2020 0920   AST 15 07/18/2016 1135   ALT 33 02/21/2020 0920   ALT 22 04/23/2017 1431   ALT 20 07/18/2016 1135   BILITOT 1.1 02/21/2020 0920   BILITOT 1.24 (H) 07/18/2016 1135       Impression and Plan: Mr. 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  He is tolerating Selinexor nicely so far this time.  Myeloma studies are pending.  We will proceed with treatment today as planned per Dr. Marin Olp.  Follow-up in 3 weeks.  He was encouraged to contact our office with any questions or concerns.   Laverna Peace, NP 10/18/202111:02 AM

## 2020-03-13 NOTE — Patient Instructions (Signed)
[facility] Discharge Instructions for Patients Receiving Chemotherapy  Today you received the following chemotherapy agents Kyprolis  To help prevent nausea and vomiting after your treatment, we encourage you to take your nausea medication    If you develop nausea and vomiting that is not controlled by your nausea medication, call the clinic.   BELOW ARE SYMPTOMS THAT SHOULD BE REPORTED IMMEDIATELY: *FEVER GREATER THAN 100.5 F *CHILLS WITH OR WITHOUT FEVER NAUSEA AND VOMITING THAT IS NOT CONTROLLED WITH YOUR NAUSEA MEDICATION *UNUSUAL SHORTNESS OF BREATH *UNUSUAL BRUISING OR BLEEDING TENDERNESS IN MOUTH AND THROAT WITH OR WITHOUT PRESENCE OF ULCERS *URINARY PROBLEMS *BOWEL PROBLEMS UNUSUAL RASH Items with * indicate a potential emergency and should be followed up as soon as possible.  Feel free to call the clinic should you have any questions or concerns. The clinic phone number is [phone].  Please show the CHEMO ALERT CARD at check-in to the Emergency Department and triage nurse.   

## 2020-03-13 NOTE — Patient Instructions (Signed)

## 2020-03-14 LAB — PROTEIN ELECTROPHORESIS, SERUM, WITH REFLEX
A/G Ratio: 1.6 (ref 0.7–1.7)
Albumin ELP: 3.6 g/dL (ref 2.9–4.4)
Alpha-1-Globulin: 0.2 g/dL (ref 0.0–0.4)
Alpha-2-Globulin: 0.6 g/dL (ref 0.4–1.0)
Beta Globulin: 0.7 g/dL (ref 0.7–1.3)
Gamma Globulin: 0.7 g/dL (ref 0.4–1.8)
Globulin, Total: 2.3 g/dL (ref 2.2–3.9)
Total Protein ELP: 5.9 g/dL — ABNORMAL LOW (ref 6.0–8.5)

## 2020-03-14 LAB — KAPPA/LAMBDA LIGHT CHAINS
Kappa free light chain: 53 mg/L — ABNORMAL HIGH (ref 3.3–19.4)
Kappa, lambda light chain ratio: 33.13 — ABNORMAL HIGH (ref 0.26–1.65)
Lambda free light chains: 1.6 mg/L — ABNORMAL LOW (ref 5.7–26.3)

## 2020-03-14 LAB — IGG, IGA, IGM
IgA: 12 mg/dL — ABNORMAL LOW (ref 61–437)
IgG (Immunoglobin G), Serum: 823 mg/dL (ref 603–1613)
IgM (Immunoglobulin M), Srm: 5 mg/dL — ABNORMAL LOW (ref 15–143)

## 2020-03-15 ENCOUNTER — Encounter: Payer: Self-pay | Admitting: *Deleted

## 2020-03-24 DIAGNOSIS — Z23 Encounter for immunization: Secondary | ICD-10-CM | POA: Diagnosis not present

## 2020-03-27 ENCOUNTER — Ambulatory Visit: Payer: Medicare Other | Admitting: Family

## 2020-03-27 ENCOUNTER — Ambulatory Visit: Payer: Medicare Other

## 2020-03-27 ENCOUNTER — Other Ambulatory Visit: Payer: Medicare Other

## 2020-04-03 ENCOUNTER — Ambulatory Visit: Payer: Medicare Other

## 2020-04-03 ENCOUNTER — Inpatient Hospital Stay: Payer: Medicare Other | Attending: Hematology & Oncology

## 2020-04-03 ENCOUNTER — Ambulatory Visit: Payer: Medicare Other | Admitting: Hematology & Oncology

## 2020-04-03 ENCOUNTER — Inpatient Hospital Stay: Payer: Medicare Other

## 2020-04-03 ENCOUNTER — Encounter: Payer: Self-pay | Admitting: Hematology & Oncology

## 2020-04-03 ENCOUNTER — Inpatient Hospital Stay (HOSPITAL_BASED_OUTPATIENT_CLINIC_OR_DEPARTMENT_OTHER): Payer: Medicare Other | Admitting: Hematology & Oncology

## 2020-04-03 ENCOUNTER — Other Ambulatory Visit: Payer: Self-pay

## 2020-04-03 ENCOUNTER — Other Ambulatory Visit: Payer: Medicare Other

## 2020-04-03 VITALS — BP 126/76 | HR 74 | Resp 18

## 2020-04-03 VITALS — BP 116/66 | HR 76 | Temp 99.1°F | Resp 18 | Wt 205.0 lb

## 2020-04-03 DIAGNOSIS — C9000 Multiple myeloma not having achieved remission: Secondary | ICD-10-CM

## 2020-04-03 DIAGNOSIS — Z79899 Other long term (current) drug therapy: Secondary | ICD-10-CM | POA: Diagnosis not present

## 2020-04-03 DIAGNOSIS — E109 Type 1 diabetes mellitus without complications: Secondary | ICD-10-CM | POA: Insufficient documentation

## 2020-04-03 DIAGNOSIS — D801 Nonfamilial hypogammaglobulinemia: Secondary | ICD-10-CM

## 2020-04-03 DIAGNOSIS — Z5112 Encounter for antineoplastic immunotherapy: Secondary | ICD-10-CM | POA: Diagnosis not present

## 2020-04-03 DIAGNOSIS — C9002 Multiple myeloma in relapse: Secondary | ICD-10-CM | POA: Insufficient documentation

## 2020-04-03 LAB — CMP (CANCER CENTER ONLY)
ALT: 30 U/L (ref 0–44)
AST: 32 U/L (ref 15–41)
Albumin: 4.2 g/dL (ref 3.5–5.0)
Alkaline Phosphatase: 51 U/L (ref 38–126)
Anion gap: 6 (ref 5–15)
BUN: 29 mg/dL — ABNORMAL HIGH (ref 8–23)
CO2: 24 mmol/L (ref 22–32)
Calcium: 9.1 mg/dL (ref 8.9–10.3)
Chloride: 103 mmol/L (ref 98–111)
Creatinine: 1.31 mg/dL — ABNORMAL HIGH (ref 0.61–1.24)
GFR, Estimated: 56 mL/min — ABNORMAL LOW (ref >60)
Glucose, Bld: 164 mg/dL — ABNORMAL HIGH (ref 70–99)
Potassium: 4.2 mmol/L (ref 3.5–5.1)
Sodium: 133 mmol/L — ABNORMAL LOW (ref 135–145)
Total Bilirubin: 1.3 mg/dL — ABNORMAL HIGH (ref 0.3–1.2)
Total Protein: 6.2 g/dL — ABNORMAL LOW (ref 6.5–8.1)

## 2020-04-03 LAB — CBC WITH DIFFERENTIAL (CANCER CENTER ONLY)
Abs Immature Granulocytes: 0.01 10*3/uL (ref 0.00–0.07)
Basophils Absolute: 0 10*3/uL (ref 0.0–0.1)
Basophils Relative: 1 %
Eosinophils Absolute: 0 10*3/uL (ref 0.0–0.5)
Eosinophils Relative: 0 %
HCT: 43.9 % (ref 39.0–52.0)
Hemoglobin: 15.4 g/dL (ref 13.0–17.0)
Immature Granulocytes: 0 %
Lymphocytes Relative: 37 %
Lymphs Abs: 1.2 10*3/uL (ref 0.7–4.0)
MCH: 37.1 pg — ABNORMAL HIGH (ref 26.0–34.0)
MCHC: 35.1 g/dL (ref 30.0–36.0)
MCV: 105.8 fL — ABNORMAL HIGH (ref 80.0–100.0)
Monocytes Absolute: 0.5 10*3/uL (ref 0.1–1.0)
Monocytes Relative: 15 %
Neutro Abs: 1.5 10*3/uL — ABNORMAL LOW (ref 1.7–7.7)
Neutrophils Relative %: 47 %
Platelet Count: 133 10*3/uL — ABNORMAL LOW (ref 150–400)
RBC: 4.15 MIL/uL — ABNORMAL LOW (ref 4.22–5.81)
RDW: 14.1 % (ref 11.5–15.5)
WBC Count: 3.3 10*3/uL — ABNORMAL LOW (ref 4.0–10.5)
nRBC: 1.8 % — ABNORMAL HIGH (ref 0.0–0.2)

## 2020-04-03 LAB — LACTATE DEHYDROGENASE: LDH: 194 U/L — ABNORMAL HIGH (ref 98–192)

## 2020-04-03 MED ORDER — DIPHENHYDRAMINE HCL 25 MG PO CAPS
50.0000 mg | ORAL_CAPSULE | Freq: Once | ORAL | Status: AC
  Administered 2020-04-03: 50 mg via ORAL

## 2020-04-03 MED ORDER — HEPARIN SOD (PORK) LOCK FLUSH 100 UNIT/ML IV SOLN
500.0000 [IU] | Freq: Once | INTRAVENOUS | Status: AC | PRN
  Administered 2020-04-03: 500 [IU]
  Filled 2020-04-03: qty 5

## 2020-04-03 MED ORDER — DIPHENHYDRAMINE HCL 25 MG PO CAPS
ORAL_CAPSULE | ORAL | Status: AC
  Filled 2020-04-03: qty 2

## 2020-04-03 MED ORDER — IMMUNE GLOBULIN (HUMAN) 20 GM/200ML IV SOLN
40.0000 g | Freq: Once | INTRAVENOUS | Status: AC
  Administered 2020-04-03: 40 g via INTRAVENOUS
  Filled 2020-04-03: qty 400

## 2020-04-03 MED ORDER — SODIUM CHLORIDE 0.9 % IV SOLN
Freq: Once | INTRAVENOUS | Status: AC
  Filled 2020-04-03: qty 250

## 2020-04-03 MED ORDER — ACETAMINOPHEN 325 MG PO TABS
ORAL_TABLET | ORAL | Status: AC
  Filled 2020-04-03: qty 2

## 2020-04-03 MED ORDER — DEXTROSE 5 % IV SOLN
70.0000 mg/m2 | Freq: Once | INTRAVENOUS | Status: AC
  Administered 2020-04-03: 150 mg via INTRAVENOUS
  Filled 2020-04-03: qty 15

## 2020-04-03 MED ORDER — SODIUM CHLORIDE 0.9% FLUSH
10.0000 mL | INTRAVENOUS | Status: DC | PRN
  Administered 2020-04-03: 10 mL
  Filled 2020-04-03: qty 10

## 2020-04-03 MED ORDER — PROCHLORPERAZINE MALEATE 10 MG PO TABS
10.0000 mg | ORAL_TABLET | Freq: Once | ORAL | Status: AC
  Administered 2020-04-03: 10 mg via ORAL

## 2020-04-03 MED ORDER — ACETAMINOPHEN 325 MG PO TABS
650.0000 mg | ORAL_TABLET | Freq: Once | ORAL | Status: AC
  Administered 2020-04-03: 650 mg via ORAL

## 2020-04-03 MED ORDER — SODIUM CHLORIDE 0.9 % IV SOLN
20.0000 mg | Freq: Once | INTRAVENOUS | Status: AC
  Administered 2020-04-03: 20 mg via INTRAVENOUS
  Filled 2020-04-03: qty 2

## 2020-04-03 MED ORDER — PROCHLORPERAZINE MALEATE 10 MG PO TABS
ORAL_TABLET | ORAL | Status: AC
  Filled 2020-04-03: qty 1

## 2020-04-03 NOTE — Patient Instructions (Addendum)
Carfilzomib injection What is this medicine? CARFILZOMIB (kar FILZ oh mib) targets a specific protein within cancer cells and stops the cancer cells from growing. It is used to treat multiple myeloma. This medicine may be used for other purposes; ask your health care provider or pharmacist if you have questions. COMMON BRAND NAME(S): KYPROLIS What should I tell my health care provider before I take this medicine? They need to know if you have any of these conditions:  heart disease  history of blood clots  irregular heartbeat  kidney disease  liver disease  lung or breathing disease  an unusual or allergic reaction to carfilzomib, or other medicines, foods, dyes, or preservatives  pregnant or trying to get pregnant  breast-feeding How should I use this medicine? This medicine is for injection or infusion into a vein. It is given by a health care professional in a hospital or clinic setting. Talk to your pediatrician regarding the use of this medicine in children. Special care may be needed. Overdosage: If you think you have taken too much of this medicine contact a poison control center or emergency room at once. NOTE: This medicine is only for you. Do not share this medicine with others. What if I miss a dose? It is important not to miss your dose. Call your doctor or health care professional if you are unable to keep an appointment. What may interact with this medicine? Interactions are not expected. Give your health care provider a list of all the medicines, herbs, non-prescription drugs, or dietary supplements you use. Also tell them if you smoke, drink alcohol, or use illegal drugs. Some items may interact with your medicine. This list may not describe all possible interactions. Give your health care provider a list of all the medicines, herbs, non-prescription drugs, or dietary supplements you use. Also tell them if you smoke, drink alcohol, or use illegal drugs. Some items  may interact with your medicine. What should I watch for while using this medicine? Your condition will be monitored carefully while you are receiving this medicine. Report any side effects. Continue your course of treatment even though you feel ill unless your doctor tells you to stop. You may need blood work done while you are taking this medicine. Do not become pregnant while taking this medicine or for at least 6 months after stopping it. Women should inform their doctor if they wish to become pregnant or think they might be pregnant. There is a potential for serious side effects to an unborn child. Men should not father a child while taking this medicine and for at least 3 months after stopping it. Talk to your health care professional or pharmacist for more information. Do not breast-feed an infant while taking this medicine or for 2 weeks after the last dose. Check with your doctor or health care professional if you get an attack of severe diarrhea, nausea and vomiting, or if you sweat a lot. The loss of too much body fluid can make it dangerous for you to take this medicine. You may get dizzy. Do not drive, use machinery, or do anything that needs mental alertness until you know how this medicine affects you. Do not stand or sit up quickly, especially if you are an older patient. This reduces the risk of dizzy or fainting spells. What side effects may I notice from receiving this medicine? Side effects that you should report to your doctor or health care professional as soon as possible:  allergic reactions like skin  rash, itching or hives, swelling of the face, lips, or tongue  confusion  dizziness  feeling faint or lightheaded  fever or chills  palpitations  seizures  signs and symptoms of bleeding such as bloody or black, tarry stools; red or dark-brown urine; spitting up blood or brown material that looks like coffee grounds; red spots on the skin; unusual bruising or bleeding  including from the eye, gums, or nose  signs and symptoms of a blood clot such as breathing problems; changes in vision; chest pain; severe, sudden headache; pain, swelling, warmth in the leg; trouble speaking; sudden numbness or weakness of the face, arm or leg  signs and symptoms of kidney injury like trouble passing urine or change in the amount of urine  signs and symptoms of liver injury like dark yellow or brown urine; general ill feeling or flu-like symptoms; light-colored stools; loss of appetite; nausea; right upper belly pain; unusually weak or tired; yellowing of the eyes or skin Side effects that usually do not require medical attention (report to your doctor or health care professional if they continue or are bothersome):  back pain  cough  diarrhea  headache  muscle cramps  trouble sleeping  vomiting This list may not describe all possible side effects. Call your doctor for medical advice about side effects. You may report side effects to FDA at 1-800-FDA-1088. Where should I keep my medicine? This drug is given in a hospital or clinic and will not be stored at home. NOTE: This sheet is a summary. It may not cover all possible information. If you have questions about this medicine, talk to your doctor, pharmacist, or health care provider.  2020 Elsevier/Gold Standard (2019-01-18 19:44:21) Prochlorperazine tablets What is this medicine? PROCHLORPERAZINE (proe klor PER a zeen) helps to control severe nausea and vomiting. This medicine is also used to treat schizophrenia. It can also help patients who experience anxiety that is not due to psychological illness. This medicine may be used for other purposes; ask your health care provider or pharmacist if you have questions. COMMON BRAND NAME(S): Compazine What should I tell my health care provider before I take this medicine? They need to know if you have any of these conditions:  blockage in your bowel  brain  tumor  dementia  diabetes  difficulty swallowing  glaucoma  have trouble controlling your muscles  head injury  heart disease  history of irregular heartbeat  if you often drink alcohol  liver disease  low blood counts, like low white cell, platelet, or red cell counts  low blood pressure  lung or breathing disease, like asthma  Parkinson's disease  prostate disease  seizures  trouble passing urine  an unusual or allergic reaction to prochlorperazine, other medicines, foods, dyes, or preservatives  pregnant or trying to get pregnant  breast-feeding How should I use this medicine? Take this medicine by mouth with a glass of water. Follow the directions on the prescription label. Take your doses at regular intervals. Do not take your medicine more often than directed. Do not stop taking this medicine suddenly. This can cause nausea, vomiting, and dizziness. Ask your doctor or health care professional for advice. Talk to your pediatrician regarding the use of this medicine in children. Special care may be needed. While this drug may be prescribed for children as young as 2 years for selected conditions, precautions do apply. Overdosage: If you think you have taken too much of this medicine contact a poison control center or emergency  room at once. NOTE: This medicine is only for you. Do not share this medicine with others. What if I miss a dose? If you miss a dose, take it as soon as you can. If it is almost time for your next dose, take only that dose. Do not take double or extra doses. What may interact with this medicine? Do not take this medicine with any of the following medications:  cisapride  dofetilide  dronedarone  metoclopramide  pimozide  saquinavir  thioridazine This medicine may also interact with the following medications:  alcohol  antihistamines for allergy, cough, and cold  atropine  certain medicines for anxiety or  sleep  certain medicines for bladder problems like oxybutynin, tolterodine  certain medicines for depression like amitriptyline, fluoxetine, sertraline  certain medicines for Parkinson's disease like benztropine, trihexyphenidyl  certain medicines for stomach problems like dicyclomine, hyoscyamine  certain medicines for travel sickness like scopolamine  epinephrine  general anesthetics like halothane, isoflurane, methoxyflurane, propofol  ipratropium  lithium  medicines for high blood pressure  medicines for seizures like phenobarbital, primidone, phenytoin  medicines that relax muscles for surgery  narcotic medicines for pain  propranolol  warfarin This list may not describe all possible interactions. Give your health care provider a list of all the medicines, herbs, non-prescription drugs, or dietary supplements you use. Also tell them if you smoke, drink alcohol, or use illegal drugs. Some items may interact with your medicine. What should I watch for while using this medicine? Visit your health care professional for regular checks on your progress. Tell your health care professional if symptoms do not start to get better or if they get worse. You may get drowsy or dizzy. Do not drive, use machinery, or do anything that needs mental alertness until you know how this medicine affects you. Do not stand or sit up quickly, especially if you are an older patient. This reduces the risk of dizzy or fainting spells. Alcohol may interfere with the effect of this medicine. Avoid alcoholic drinks. This drug can cause problems with controlling your body temperature. It can lower the response of your body to cold temperatures. If possible, stay indoors during cold weather. If you must go outdoors, wear warm clothes. It can also lower the response of your body to heat. Do not overheat. Do not over-exercise. Stay out of the sun when possible. If you must be in the sun, wear cool clothing.  Drink plenty of water. If you have trouble controlling your body temperature, call your health care provider right away. This medicine may increase blood sugar. Ask your health care provider if changes in diet or medicines are needed if you have diabetes. This medicine can make you more sensitive to the sun. Keep out of the sun. If you cannot avoid being in the sun, wear protective clothing and use sunscreen. Do not use sun lamps or tanning beds/booths. Your mouth may get dry. Chewing sugarless gum or sucking hard candy, and drinking plenty of water may help. Contact your doctor if the problem does not go away or is severe. What side effects may I notice from receiving this medicine? Side effects that you should report to your doctor or health care professional as soon as possible:  allergic reactions like skin rash, itching or hives, swelling of the face, lips, or tongue  abnormal production of milk  breast enlargement in both males and females  changes in vision  chest pain  confusion  fast, irregular heartbeat  fever,  chills, sore throat  seizures  signs and symptoms of high blood sugar such as being more thirsty or hungry or having to urinate more than normal. You may also feel very tired or have blurry vision.  signs and symptoms of liver injury like dark yellow or brown urine; general ill feeling or flu-like symptoms; light-colored stools; loss of appetite; nausea; right upper belly pain; unusually weak or tired; yellowing of the eyes or skin  signs and symptoms of low blood pressure like dizziness; feeling faint or lightheaded, falls; unusually weak or tired  trouble passing urine or change in the amount of urine  trouble swallowing  uncontrollable movements of the arms, face, head, mouth, neck, or upper body  unusual bruising or bleeding  unusually weak or tired Side effects that usually do not require medical attention (report to your doctor or health care  professional if they continue or are bothersome):  constipation  drowsiness  dry mouth This list may not describe all possible side effects. Call your doctor for medical advice about side effects. You may report side effects to FDA at 1-800-FDA-1088. Where should I keep my medicine? Keep out of the reach of children. Store at room temperature between 15 and 30 degrees C (59 and 86 degrees F). Protect from light. Throw away any unused medicine after the expiration date. NOTE: This sheet is a summary. It may not cover all possible information. If you have questions about this medicine, talk to your doctor, pharmacist, or health care provider.  2020 Elsevier/Gold Standard (2019-03-23 16:15:24) Diphenhydramine capsules or tablets What is this medicine? DIPHENHYDRAMINE (dye fen HYE dra meen) is an antihistamine. It is used to treat the symptoms of an allergic reaction. It is also used to treat Parkinson's disease. This medicine is also used to prevent and to treat motion sickness and as a nighttime sleep aid. This medicine may be used for other purposes; ask your health care provider or pharmacist if you have questions. COMMON BRAND NAME(S): Alka-Seltzer Plus Allergy, Aller-G-Time, Banophen, Benadryl Allergy, Benadryl Allergy Dye Free, Benadryl Allergy Kapgel, Benadryl Allergy Ultratab, Diphedryl, Diphenhist, Genahist, Geri-Dryl, PHARBEDRYL, Q-Dryl, Gretta Began, Valu-Dryl, Vicks ZzzQuil Nightime Sleep-Aid What should I tell my health care provider before I take this medicine? They need to know if you have any of these conditions:  asthma or lung disease  glaucoma  high blood pressure or heart disease  liver disease  pain or difficulty passing urine  prostate trouble  ulcers or other stomach problems  an unusual or allergic reaction to diphenhydramine, other medicines foods, dyes, or preservatives such as sulfites  pregnant or trying to get pregnant  breast-feeding How should I use  this medicine? Take this medicine by mouth with a full glass of water. Follow the directions on the prescription label. Take your doses at regular intervals. Do not take your medicine more often than directed. To prevent motion sickness start taking this medicine 30 to 60 minutes before you leave. Talk to your pediatrician regarding the use of this medicine in children. Special care may be needed. Patients over 85 years old may have a stronger reaction and need a smaller dose. Overdosage: If you think you have taken too much of this medicine contact a poison control center or emergency room at once. NOTE: This medicine is only for you. Do not share this medicine with others. What if I miss a dose? If you miss a dose, take it as soon as you can. If it is almost time for your  next dose, take only that dose. Do not take double or extra doses. What may interact with this medicine? Do not take this medicine with any of the following medications:  MAOIs like Carbex, Eldepryl, Marplan, Nardil, and Parnate This medicine may also interact with the following medications:  alcohol  barbiturates, like phenobarbital  medicines for bladder spasm like oxybutynin, tolterodine  medicines for blood pressure  medicines for depression, anxiety, or psychotic disturbances  medicines for movement abnormalities or Parkinson's disease  medicines for sleep  other medicines for cold, cough or allergy  some medicines for the stomach like chlordiazepoxide, dicyclomine This list may not describe all possible interactions. Give your health care provider a list of all the medicines, herbs, non-prescription drugs, or dietary supplements you use. Also tell them if you smoke, drink alcohol, or use illegal drugs. Some items may interact with your medicine. What should I watch for while using this medicine? Visit your doctor or health care professional for regular check ups. Tell your doctor if your symptoms do not  improve or if they get worse. Your mouth may get dry. Chewing sugarless gum or sucking hard candy, and drinking plenty of water may help. Contact your doctor if the problem does not go away or is severe. This medicine may cause dry eyes and blurred vision. If you wear contact lenses you may feel some discomfort. Lubricating drops may help. See your eye doctor if the problem does not go away or is severe. You may get drowsy or dizzy. Do not drive, use machinery, or do anything that needs mental alertness until you know how this medicine affects you. Do not stand or sit up quickly, especially if you are an older patient. This reduces the risk of dizzy or fainting spells. Alcohol may interfere with the effect of this medicine. Avoid alcoholic drinks. What side effects may I notice from receiving this medicine? Side effects that you should report to your doctor or health care professional as soon as possible:  allergic reactions like skin rash, itching or hives, swelling of the face, lips, or tongue  changes in vision  confused, agitated, nervous  irregular or fast heartbeat  tremor  trouble passing urine  unusual bleeding or bruising  unusually weak or tired Side effects that usually do not require medical attention (report to your doctor or health care professional if they continue or are bothersome):  constipation, diarrhea  drowsy  headache  loss of appetite  stomach upset, vomiting  thick mucous This list may not describe all possible side effects. Call your doctor for medical advice about side effects. You may report side effects to FDA at 1-800-FDA-1088. Where should I keep my medicine? Keep out of the reach of children. This medicine can be abused. Keep your medicine in a safe place. Store at room temperature between 15 and 30 degrees C (59 and 86 degrees F). Keep container closed tightly. Throw away any unused medicine after the expiration date. NOTE: This sheet is a  summary. It may not cover all possible information. If you have questions about this medicine, talk to your doctor, pharmacist, or health care provider.  2020 Elsevier/Gold Standard (2019-02-19 10:18:35) Acetaminophen tablets or caplets What is this medicine? ACETAMINOPHEN (a set a MEE noe fen) is a pain reliever. It is used to treat mild pain and fever. This medicine may be used for other purposes; ask your health care provider or pharmacist if you have questions. COMMON BRAND NAME(S): Aceta, Actamin, Anacin Aspirin Free, Genapap,  Genebs, Mapap, Pain & Fever, Pain and Fever, PAIN RELIEF, PAIN RELIEF Extra Strength, Pain Reliever, Panadol, PHARBETOL, Q-Pap, Q-Pap Extra Strength, Tylenol, Tylenol CrushableTablet, Tylenol Extra Strength, XS No Aspirin, XS Pain Reliever What should I tell my health care provider before I take this medicine? They need to know if you have any of these conditions:  if you often drink alcohol  liver disease  an unusual or allergic reaction to acetaminophen, other medicines, foods, dyes, or preservatives  pregnant or trying to get pregnant  breast-feeding How should I use this medicine? Take this medicine by mouth with a glass of water. Follow the directions on the package or prescription label. Take your medicine at regular intervals. Do not take your medicine more often than directed. Talk to your pediatrician regarding the use of this medicine in children. While this drug may be prescribed for children as young as 30 years of age for selected conditions, precautions do apply. Overdosage: If you think you have taken too much of this medicine contact a poison control center or emergency room at once. NOTE: This medicine is only for you. Do not share this medicine with others. What if I miss a dose? If you miss a dose, take it as soon as you can. If it is almost time for your next dose, take only that dose. Do not take double or extra doses. What may interact with  this medicine?  alcohol  imatinib  isoniazid  other medicines with acetaminophen This list may not describe all possible interactions. Give your health care provider a list of all the medicines, herbs, non-prescription drugs, or dietary supplements you use. Also tell them if you smoke, drink alcohol, or use illegal drugs. Some items may interact with your medicine. What should I watch for while using this medicine? Tell your doctor or health care professional if the pain lasts more than 10 days (5 days for children), if it gets worse, or if there is a new or different kind of pain. Also, check with your doctor if a fever lasts for more than 3 days. Do not take other medicines that contain acetaminophen with this medicine. Always read labels carefully. If you have questions, ask your doctor or pharmacist. If you take too much acetaminophen get medical help right away. Too much acetaminophen can be very dangerous and cause liver damage. Even if you do not have symptoms, it is important to get help right away. What side effects may I notice from receiving this medicine? Side effects that you should report to your doctor or health care professional as soon as possible:  allergic reactions like skin rash, itching or hives, swelling of the face, lips, or tongue  breathing problems  fever or sore throat  redness, blistering, peeling or loosening of the skin, including inside the mouth  trouble passing urine or change in the amount of urine  unusual bleeding or bruising  unusually weak or tired  yellowing of the eyes or skin Side effects that usually do not require medical attention (report to your doctor or health care professional if they continue or are bothersome):  headache  nausea, stomach upset This list may not describe all possible side effects. Call your doctor for medical advice about side effects. You may report side effects to FDA at 1-800-FDA-1088. Where should I keep my  medicine? Keep out of reach of children. Store at room temperature between 20 and 25 degrees C (68 and 77 degrees F). Protect from moisture and heat.  Throw away any unused medicine after the expiration date. NOTE: This sheet is a summary. It may not cover all possible information. If you have questions about this medicine, talk to your doctor, pharmacist, or health care provider.  2020 Elsevier/Gold Standard (2013-01-04 12:54:16) Dexamethasone injection What is this medicine? DEXAMETHASONE (dex a METH a sone) is a corticosteroid. It is used to treat inflammation of the skin, joints, lungs, and other organs. Common conditions treated include asthma, allergies, and arthritis. It is also used for other conditions, like blood disorders and diseases of the adrenal glands. This medicine may be used for other purposes; ask your health care provider or pharmacist if you have questions. COMMON BRAND NAME(S): Decadron, DoubleDex, Simplist Dexamethasone, Solurex What should I tell my health care provider before I take this medicine? They need to know if you have any of these conditions:  Cushing's syndrome  diabetes  glaucoma  heart disease  high blood pressure  infection like herpes, measles, tuberculosis, or chickenpox  kidney disease  liver disease  mental illness  myasthenia gravis  osteoporosis  previous heart attack  seizures  stomach or intestine problems  thyroid disease  an unusual or allergic reaction to dexamethasone, corticosteroids, other medicines, lactose, foods, dyes, or preservatives  pregnant or trying to get pregnant  breast-feeding How should I use this medicine? This medicine is for injection into a muscle, joint, lesion, soft tissue, or vein. It is given by a health care professional in a hospital or clinic setting. Talk to your pediatrician regarding the use of this medicine in children. Special care may be needed. Overdosage: If you think you have  taken too much of this medicine contact a poison control center or emergency room at once. NOTE: This medicine is only for you. Do not share this medicine with others. What if I miss a dose? This may not apply. If you are having a series of injections over a prolonged period, try not to miss an appointment. Call your doctor or health care professional to reschedule if you are unable to keep an appointment. What may interact with this medicine? Do not take this medicine with any of the following medications:  live virus vaccines This medicine may also interact with the following medications:  aminoglutethimide  amphotericin B  aspirin and aspirin-like medicines  certain antibiotics like erythromycin, clarithromycin, and troleandomycin  certain antivirals for HIV or hepatitis  certain medicines for seizures like carbamazepine, phenobarbital, phenytoin  certain medicines to treat myasthenia gravis  cholestyramine  cyclosporine  digoxin  diuretics  ephedrine  male hormones, like estrogen or progestins and birth control pills  insulin or other medicines for diabetes  isoniazid  ketoconazole  medicines that relax muscles for surgery  mifepristone  NSAIDs, medicines for pain and inflammation, like ibuprofen or naproxen  rifampin  skin tests for allergies  thalidomide  vaccines  warfarin This list may not describe all possible interactions. Give your health care provider a list of all the medicines, herbs, non-prescription drugs, or dietary supplements you use. Also tell them if you smoke, drink alcohol, or use illegal drugs. Some items may interact with your medicine. What should I watch for while using this medicine? Visit your health care professional for regular checks on your progress. Tell your health care professional if your symptoms do not start to get better or if they get worse. Your condition will be monitored carefully while you are receiving this  medicine. Wear a medical ID bracelet or chain. Carry a card  that describes your disease and details of your medicine and dosage times. This medicine may increase your risk of getting an infection. Call your health care professional for advice if you get a fever, chills, or sore throat, or other symptoms of a cold or flu. Do not treat yourself. Try to avoid being around people who are sick. Call your health care professional if you are around anyone with measles, chickenpox, or if you develop sores or blisters that do not heal properly. If you are going to need surgery or other procedures, tell your doctor or health care professional that you have taken this medicine within the last 12 months. Ask your doctor or health care professional about your diet. You may need to lower the amount of salt you eat. This medicine may increase blood sugar. Ask your healthcare provider if changes in diet or medicines are needed if you have diabetes. What side effects may I notice from receiving this medicine? Side effects that you should report to your doctor or health care professional as soon as possible:  allergic reactions like skin rash, itching or hives, swelling of the face, lips, or tongue  bloody or black, tarry stools  changes in emotions or moods  changes in vision  confusion, excitement, restlessness  depressed mood  eye pain  hallucinations  muscle weakness  severe or sudden stomach or belly pain  signs and symptoms of high blood sugar such as being more thirsty or hungry or having to urinate more than normal. You may also feel very tired or have blurry vision.  signs and symptoms of infection like fever; chills; cough; sore throat; pain or trouble passing urine  swelling of ankles, feet  unusual bruising or bleeding  wounds that do not heal Side effects that usually do not require medical attention (report to your doctor or health care professional if they continue or are  bothersome):  increased appetite  increased growth of face or body hair  headache  nausea, vomiting  pain, redness, or irritation at site where injected  skin problems, acne, thin and shiny skin  trouble sleeping  weight gain This list may not describe all possible side effects. Call your doctor for medical advice about side effects. You may report side effects to FDA at 1-800-FDA-1088. Where should I keep my medicine? This medicine is given in a hospital or clinic and will not be stored at home. NOTE: This sheet is a summary. It may not cover all possible information. If you have questions about this medicine, talk to your doctor, pharmacist, or health care provider.  2020 Elsevier/Gold Standard (2018-11-24 13:51:58) Pt discharged in no apparent distress. Pt left ambulatory without assistance. Pt aware of discharge instructions and verbalized understanding and had no further questions.

## 2020-04-03 NOTE — Patient Instructions (Signed)

## 2020-04-03 NOTE — Progress Notes (Signed)
Hematology and Oncology Follow Up Visit  Rickey [name] [id number] Oct 06, 1940 79 y.o. 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 Elotuzumab/Pomalidomide/decadron -every 4 weeks- start 01/12/2018 Kyprolis/Pomalidomide/Decadron -- s/p cycle #9 -- d/c pomaldomide in 01/2019 Xpovio 80 mg po q week -- start on 12/26/2019 IVIG 40 mg IV q 6 weeks -- next dose in 04/03/2020   Interim History:  Rickey [name] is back for follow-up.  He does appear to be doing quite well.  The kappa light chain myeloma is holding relatively stable.  When we last saw him, the Kappa light chain was 5.3 mg/dL.  He has had very few problems with the Selinexor.  He takes his weekly.  He gets this in addition to the Kyprolis.  He would like to think about all CAR-T therapy.  What he is taking now is making little bit tired.  He does not have as much energy as he would like.  He would really like to start traveling again next year once the coronavirus pandemic eases up.  I will see about making a referral down to the [facility] and have one of the specialist down there take a look at him.  His blood sugars are doing quite well right now.  He has had no problems with diarrhea.  He has had no rashes.  He has had no issues with nausea or vomiting.  He has had no fever.  There is no problems with headache.  Overall, his performance status is ECOG 1.    Medications:  Allergies as of 04/03/2020   No Known Allergies     Medication List       Accurate as of April 03, 2020 10:05 AM. If you have any questions, ask your nurse or doctor.        acyclovir 400 MG tablet Commonly known as: ZOVIRAX TAKE 1 TABLET BY MOUTH TWICE DAILY   aspirin 81 MG  tablet Take 81 mg by mouth daily.   atorvastatin 10 MG tablet Commonly known as: LIPITOR Take 10 mg by mouth daily.   CALCIUM CARBONATE PO Take 220 mg by mouth 2 (two) times daily.   calcium-vitamin D 250-125 MG-UNIT tablet Commonly known as: OSCAL Take 1 tablet by mouth daily.   Cartridge Pump Misc Inject into the skin.   cycloSPORINE 0.05 % ophthalmic emulsion Commonly known as: RESTASIS Place 1 drop into both eyes 2 (two) times daily.   diphenhydramine-acetaminophen 25-500 MG Tabs tablet Commonly known as: TYLENOL PM Take 1 tablet by mouth at bedtime as needed.   diphenoxylate-atropine 2.5-0.025 MG tablet Commonly known as: LOMOTIL Take 1 tablet by mouth 4 (four) times daily as needed for diarrhea or loose stools.   fish oil-omega-3 fatty acids 1000 MG capsule Take 1 g by mouth 2 (two) times daily.   INSULIN LISP & LISP PROT (HUM) [place]   insulin lispro 100 UNIT/ML injection Commonly known as: HUMALOG Inject into the skin. Insulin pump 12/21/2018 Typically 80U daily.   irbesartan-hydrochlorothiazide 300-12.5 MG tablet Commonly known as: AVALIDE Take 1 tablet by mouth daily.   lidocaine-prilocaine cream Commonly known as: EMLA Apply 1 application topically as needed.   montelukast 10 MG tablet Commonly known as: SINGULAIR Take 10 mg by mouth at bedtime.  ondansetron 4 MG disintegrating tablet Commonly known as: ZOFRAN-ODT Take 1 tablet (4 mg total) by mouth 4 (four) times daily as needed for nausea or vomiting.   ondansetron 8 MG tablet Commonly known as: ZOFRAN Take 1 tablet (8 mg total) by mouth every 8 (eight) hours as needed for nausea or vomiting.   prochlorperazine 10 MG tablet Commonly known as: COMPAZINE TAKE 1 TABLET(10 MG) BY MOUTH EVERY 6 HOURS AS NEEDED FOR NAUSEA OR VOMITING   Vitamin D 50 MCG (2000 UT) Caps Take by mouth every morning.   Xpovio (80 MG Once Weekly) 40 MG Tbpk Generic drug: selinexor Take 80 mg by mouth once a week.        Allergies: No Known Allergies  Past Medical History, Surgical history, Social history, and Family History were reviewed and updated.  Review of Systems: Review of Systems  Constitutional: Negative.   HENT: Negative.   Eyes: Negative.   Respiratory: Negative.   Cardiovascular: Negative.   Gastrointestinal: Negative.   Genitourinary: Negative.   Musculoskeletal: Negative.   Skin: Negative.   Neurological: Negative.   Endo/Heme/Allergies: Negative.   Psychiatric/Behavioral: Negative.      Physical Exam:  weight is 205 lb (93 kg). His oral temperature is 99.1 F (37.3 C). His blood pressure is 116/66 and his pulse is 76. His respiration is 18 and oxygen saturation is 100%.   Wt Readings from Last 3 Encounters:  04/03/20 205 lb (93 kg)  03/13/20 208 lb (94.3 kg)  02/21/20 200 lb 8 oz (90.9 kg)    Physical Exam Vitals reviewed.  HENT:     Head: Normocephalic and atraumatic.  Eyes:     Pupils: Pupils are equal, round, and reactive to light.  Cardiovascular:     Rate and Rhythm: Normal rate and regular rhythm.     Heart sounds: Normal heart sounds.  Pulmonary:     Effort: Pulmonary effort is normal.     Breath sounds: Normal breath sounds.  Abdominal:     General: Bowel sounds are normal.     Palpations: Abdomen is soft.  Musculoskeletal:        General: No tenderness or deformity. Normal range of motion.     Cervical back: Normal range of motion.  Lymphadenopathy:     Cervical: No cervical adenopathy.  Skin:    General: Skin is warm and dry.     Findings: No erythema or rash.  Neurological:     Mental Status: He is alert and oriented to person, place, and time.  Psychiatric:        Behavior: Behavior normal.        Thought Content: Thought content normal.        Judgment: Judgment normal.    Lab Results  Component Value Date   WBC 3.3 (L) 04/03/2020   HGB 15.4 04/03/2020   HCT 43.9 04/03/2020   MCV 105.8 (H) 04/03/2020   PLT 133 (L) 04/03/2020    Lab Results  Component Value Date   FERRITIN 258 12/28/2018   IRON 98 12/28/2018   TIBC 253 12/28/2018   UIBC 154 12/28/2018   IRONPCTSAT 39 12/28/2018   Lab Results  Component Value Date   RBC 4.15 (L) 04/03/2020   Lab Results  Component Value Date   KPAFRELGTCHN 53.0 (H) 03/13/2020   LAMBDASER 1.6 (L) 03/13/2020   KAPLAMBRATIO 33.13 (H) 03/13/2020   Lab Results  Component Value Date   IGGSERUM 823 03/13/2020   IGA 12 (L) 03/13/2020  IGMSERUM 5 (L) 03/13/2020   Lab Results  Component Value Date   TOTALPROTELP 5.9 (L) 03/13/2020   ALBUMINELP 3.6 03/13/2020   A1GS 0.2 03/13/2020   A2GS 0.6 03/13/2020   BETS 0.7 03/13/2020   BETA2SER 0.3 04/19/2015   GAMS 0.7 03/13/2020   MSPIKE Not Observed 03/13/2020   SPEI Comment 10/18/2019     Chemistry      Component Value Date/Time   NA 133 (L) 04/03/2020 0924   NA 142 04/23/2017 1431   NA 139 07/18/2016 1135   K 4.2 04/03/2020 0924   K 4.0 04/23/2017 1431   K 4.4 07/18/2016 1135   CL 103 04/03/2020 0924   CL 105 04/23/2017 1431   CO2 24 04/03/2020 0924   CO2 28 04/23/2017 1431   CO2 27 07/18/2016 1135   BUN 29 (H) 04/03/2020 0924   BUN 20 04/23/2017 1431   BUN 12.2 07/18/2016 1135   CREATININE 1.31 (H) 04/03/2020 0924   CREATININE 1.2 04/23/2017 1431   CREATININE 0.9 07/18/2016 1135      Component Value Date/Time   CALCIUM 9.1 04/03/2020 0924   CALCIUM 9.3 04/23/2017 1431   CALCIUM 8.9 07/18/2016 1135   ALKPHOS 51 04/03/2020 0924   ALKPHOS 64 04/23/2017 1431   ALKPHOS 58 07/18/2016 1135   AST 32 04/03/2020 0924   AST 15 07/18/2016 1135   ALT 30 04/03/2020 0924   ALT 22 04/23/2017 1431   ALT 20 07/18/2016 1135   BILITOT 1.3 (H) 04/03/2020 0924   BILITOT 1.24 (H) 07/18/2016 1135      Impression and Plan: Rickey [name] is a very pleasant 79 yo caucasian gentleman with 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   Again, will we will see about making the referral down to the  [facility].  I realize he has not had a bone marrow biopsy for several years.  I am sure that they probably will want to do one on him down there.  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  As such, venetoclax could always always be a possibility for him.  He just wants to try to improve his quality of life.  He thinks that if a CAR-T treatment could be done, this will be a one-time deal.  Again we will see about making referral.  I we will have to see him back after Thanksgiving [place]  He goes down to the [facility] for the winter.  He gets treated in [place].   [name], MD 11/8/202110:05 AM

## 2020-04-03 NOTE — Progress Notes (Signed)
Ok to treat despite counts per MD  

## 2020-04-04 LAB — PROTEIN ELECTROPHORESIS, SERUM
A/G Ratio: 1.6 (ref 0.7–1.7)
Albumin ELP: 3.5 g/dL (ref 2.9–4.4)
Alpha-1-Globulin: 0.2 g/dL (ref 0.0–0.4)
Alpha-2-Globulin: 0.6 g/dL (ref 0.4–1.0)
Beta Globulin: 0.8 g/dL (ref 0.7–1.3)
Gamma Globulin: 0.6 g/dL (ref 0.4–1.8)
Globulin, Total: 2.2 g/dL (ref 2.2–3.9)
Total Protein ELP: 5.7 g/dL — ABNORMAL LOW (ref 6.0–8.5)

## 2020-04-04 LAB — IGG, IGA, IGM
IgA: 13 mg/dL — ABNORMAL LOW (ref 61–437)
IgG (Immunoglobin G), Serum: 612 mg/dL (ref 603–1613)
IgM (Immunoglobulin M), Srm: 8 mg/dL — ABNORMAL LOW (ref 15–143)

## 2020-04-04 LAB — KAPPA/LAMBDA LIGHT CHAINS
Kappa free light chain: 61.3 mg/L — ABNORMAL HIGH (ref 3.3–19.4)
Kappa, lambda light chain ratio: 27.86 — ABNORMAL HIGH (ref 0.26–1.65)
Lambda free light chains: 2.2 mg/L — ABNORMAL LOW (ref 5.7–26.3)

## 2020-04-05 ENCOUNTER — Encounter: Payer: Self-pay | Admitting: *Deleted

## 2020-04-07 ENCOUNTER — Other Ambulatory Visit: Payer: Self-pay | Admitting: *Deleted

## 2020-04-07 ENCOUNTER — Telehealth: Payer: Self-pay | Admitting: *Deleted

## 2020-04-07 DIAGNOSIS — C9000 Multiple myeloma not having achieved remission: Secondary | ICD-10-CM

## 2020-04-07 NOTE — Telephone Encounter (Signed)
Patient notified that Dr. Marin Olp spoke with Dr. Pedro Earls and new pt referral has been called to [facility].  Pt appreciative of call and has no questions at this time.

## 2020-04-07 NOTE — Progress Notes (Signed)
ambulator

## 2020-04-17 ENCOUNTER — Other Ambulatory Visit: Payer: Medicare Other

## 2020-04-17 ENCOUNTER — Ambulatory Visit: Payer: Medicare Other

## 2020-04-17 ENCOUNTER — Ambulatory Visit: Payer: Medicare Other | Admitting: Family

## 2020-04-24 ENCOUNTER — Inpatient Hospital Stay: Payer: Medicare Other

## 2020-04-24 ENCOUNTER — Ambulatory Visit: Payer: Medicare Other

## 2020-04-24 ENCOUNTER — Other Ambulatory Visit: Payer: Self-pay | Admitting: *Deleted

## 2020-04-24 ENCOUNTER — Other Ambulatory Visit: Payer: Medicare Other

## 2020-04-24 ENCOUNTER — Encounter: Payer: Self-pay | Admitting: Hematology & Oncology

## 2020-04-24 ENCOUNTER — Ambulatory Visit: Payer: Medicare Other | Admitting: Family

## 2020-04-24 ENCOUNTER — Inpatient Hospital Stay (HOSPITAL_BASED_OUTPATIENT_CLINIC_OR_DEPARTMENT_OTHER): Payer: Medicare Other | Admitting: Hematology & Oncology

## 2020-04-24 ENCOUNTER — Other Ambulatory Visit: Payer: Self-pay

## 2020-04-24 VITALS — BP 119/51 | HR 75 | Temp 97.8°F | Resp 17 | Wt 209.0 lb

## 2020-04-24 DIAGNOSIS — Z5112 Encounter for antineoplastic immunotherapy: Secondary | ICD-10-CM | POA: Diagnosis not present

## 2020-04-24 DIAGNOSIS — N1832 Chronic kidney disease, stage 3b: Secondary | ICD-10-CM

## 2020-04-24 DIAGNOSIS — E1021 Type 1 diabetes mellitus with diabetic nephropathy: Secondary | ICD-10-CM

## 2020-04-24 DIAGNOSIS — E1022 Type 1 diabetes mellitus with diabetic chronic kidney disease: Secondary | ICD-10-CM

## 2020-04-24 DIAGNOSIS — C9002 Multiple myeloma in relapse: Secondary | ICD-10-CM

## 2020-04-24 DIAGNOSIS — Z79899 Other long term (current) drug therapy: Secondary | ICD-10-CM | POA: Diagnosis not present

## 2020-04-24 DIAGNOSIS — C9000 Multiple myeloma not having achieved remission: Secondary | ICD-10-CM

## 2020-04-24 DIAGNOSIS — E109 Type 1 diabetes mellitus without complications: Secondary | ICD-10-CM | POA: Diagnosis not present

## 2020-04-24 LAB — CMP (CANCER CENTER ONLY)
ALT: 27 U/L (ref 0–44)
AST: 29 U/L (ref 15–41)
Albumin: 3.8 g/dL (ref 3.5–5.0)
Alkaline Phosphatase: 47 U/L (ref 38–126)
Anion gap: 7 (ref 5–15)
BUN: 27 mg/dL — ABNORMAL HIGH (ref 8–23)
CO2: 25 mmol/L (ref 22–32)
Calcium: 8.9 mg/dL (ref 8.9–10.3)
Chloride: 103 mmol/L (ref 98–111)
Creatinine: 1.2 mg/dL (ref 0.61–1.24)
GFR, Estimated: >60 mL/min (ref >60)
Glucose, Bld: 172 mg/dL — ABNORMAL HIGH (ref 70–99)
Potassium: 3.9 mmol/L (ref 3.5–5.1)
Sodium: 135 mmol/L (ref 135–145)
Total Bilirubin: 1.1 mg/dL (ref 0.3–1.2)
Total Protein: 6 g/dL — ABNORMAL LOW (ref 6.5–8.1)

## 2020-04-24 LAB — LACTATE DEHYDROGENASE: LDH: 205 U/L — ABNORMAL HIGH (ref 98–192)

## 2020-04-24 LAB — CBC WITH DIFFERENTIAL (CANCER CENTER ONLY)
Abs Immature Granulocytes: 0 10*3/uL (ref 0.00–0.07)
Basophils Absolute: 0 10*3/uL (ref 0.0–0.1)
Basophils Relative: 0 %
Eosinophils Absolute: 0 10*3/uL (ref 0.0–0.5)
Eosinophils Relative: 1 %
HCT: 39.3 % (ref 39.0–52.0)
Hemoglobin: 14.1 g/dL (ref 13.0–17.0)
Immature Granulocytes: 0 %
Lymphocytes Relative: 41 %
Lymphs Abs: 1.2 10*3/uL (ref 0.7–4.0)
MCH: 38.3 pg — ABNORMAL HIGH (ref 26.0–34.0)
MCHC: 35.9 g/dL (ref 30.0–36.0)
MCV: 106.8 fL — ABNORMAL HIGH (ref 80.0–100.0)
Monocytes Absolute: 0.4 10*3/uL (ref 0.1–1.0)
Monocytes Relative: 14 %
Neutro Abs: 1.3 10*3/uL — ABNORMAL LOW (ref 1.7–7.7)
Neutrophils Relative %: 44 %
Platelet Count: 64 10*3/uL — ABNORMAL LOW (ref 150–400)
RBC: 3.68 MIL/uL — ABNORMAL LOW (ref 4.22–5.81)
RDW: 13.2 % (ref 11.5–15.5)
WBC Count: 3 10*3/uL — ABNORMAL LOW (ref 4.0–10.5)
nRBC: 0 % (ref 0.0–0.2)

## 2020-04-24 LAB — HEMOGLOBIN A1C
Hgb A1c MFr Bld: 5.4 % (ref 4.8–5.6)
Mean Plasma Glucose: 108.28 mg/dL

## 2020-04-24 MED ORDER — ACETAMINOPHEN 325 MG PO TABS
ORAL_TABLET | ORAL | Status: AC
  Filled 2020-04-24: qty 2

## 2020-04-24 MED ORDER — PROCHLORPERAZINE MALEATE 10 MG PO TABS
10.0000 mg | ORAL_TABLET | Freq: Once | ORAL | Status: AC
  Administered 2020-04-24: 10 mg via ORAL

## 2020-04-24 MED ORDER — SODIUM CHLORIDE 0.9 % IV SOLN
20.0000 mg | Freq: Once | INTRAVENOUS | Status: AC
  Administered 2020-04-24: 20 mg via INTRAVENOUS
  Filled 2020-04-24: qty 2

## 2020-04-24 MED ORDER — SODIUM CHLORIDE 0.9% FLUSH
10.0000 mL | INTRAVENOUS | Status: DC | PRN
  Administered 2020-04-24: 10 mL
  Filled 2020-04-24: qty 10

## 2020-04-24 MED ORDER — PROCHLORPERAZINE MALEATE 10 MG PO TABS
ORAL_TABLET | ORAL | Status: AC
  Filled 2020-04-24: qty 1

## 2020-04-24 MED ORDER — SODIUM CHLORIDE 0.9 % IV SOLN
Freq: Once | INTRAVENOUS | Status: AC
  Filled 2020-04-24: qty 250

## 2020-04-24 MED ORDER — DIPHENHYDRAMINE HCL 25 MG PO CAPS
50.0000 mg | ORAL_CAPSULE | Freq: Once | ORAL | Status: AC
  Administered 2020-04-24: 50 mg via ORAL

## 2020-04-24 MED ORDER — HEPARIN SOD (PORK) LOCK FLUSH 100 UNIT/ML IV SOLN
500.0000 [IU] | Freq: Once | INTRAVENOUS | Status: AC | PRN
  Administered 2020-04-24: 500 [IU]
  Filled 2020-04-24: qty 5

## 2020-04-24 MED ORDER — DIPHENHYDRAMINE HCL 25 MG PO CAPS
ORAL_CAPSULE | ORAL | Status: AC
  Filled 2020-04-24: qty 2

## 2020-04-24 MED ORDER — ACETAMINOPHEN 325 MG PO TABS
650.0000 mg | ORAL_TABLET | Freq: Once | ORAL | Status: AC
  Administered 2020-04-24: 650 mg via ORAL

## 2020-04-24 MED ORDER — DEXTROSE 5 % IV SOLN
70.0000 mg/m2 | Freq: Once | INTRAVENOUS | Status: AC
  Administered 2020-04-24: 150 mg via INTRAVENOUS
  Filled 2020-04-24: qty 60

## 2020-04-24 NOTE — Progress Notes (Signed)
Pt discharged in no apparent distress. Pt left ambulatory without assistance. Pt aware of discharge instructions and verbalized understanding and had no further questions.  

## 2020-04-24 NOTE — Patient Instructions (Signed)
Carfilzomib injection What is this medicine? CARFILZOMIB (kar FILZ oh mib) targets a specific protein within cancer cells and stops the cancer cells from growing. It is used to treat multiple myeloma. This medicine may be used for other purposes; ask your health care provider or pharmacist if you have questions. COMMON BRAND NAME(S): KYPROLIS What should I tell my health care provider before I take this medicine? They need to know if you have any of these conditions:  heart disease  history of blood clots  irregular heartbeat  kidney disease  liver disease  lung or breathing disease  an unusual or allergic reaction to carfilzomib, or other medicines, foods, dyes, or preservatives  pregnant or trying to get pregnant  breast-feeding How should I use this medicine? This medicine is for injection or infusion into a vein. It is given by a health care professional in a hospital or clinic setting. Talk to your pediatrician regarding the use of this medicine in children. Special care may be needed. Overdosage: If you think you have taken too much of this medicine contact a poison control center or emergency room at once. NOTE: This medicine is only for you. Do not share this medicine with others. What if I miss a dose? It is important not to miss your dose. Call your doctor or health care professional if you are unable to keep an appointment. What may interact with this medicine? Interactions are not expected. Give your health care provider a list of all the medicines, herbs, non-prescription drugs, or dietary supplements you use. Also tell them if you smoke, drink alcohol, or use illegal drugs. Some items may interact with your medicine. This list may not describe all possible interactions. Give your health care provider a list of all the medicines, herbs, non-prescription drugs, or dietary supplements you use. Also tell them if you smoke, drink alcohol, or use illegal drugs. Some items  may interact with your medicine. What should I watch for while using this medicine? Your condition will be monitored carefully while you are receiving this medicine. Report any side effects. Continue your course of treatment even though you feel ill unless your doctor tells you to stop. You may need blood work done while you are taking this medicine. Do not become pregnant while taking this medicine or for at least 6 months after stopping it. Women should inform their doctor if they wish to become pregnant or think they might be pregnant. There is a potential for serious side effects to an unborn child. Men should not father a child while taking this medicine and for at least 3 months after stopping it. Talk to your health care professional or pharmacist for more information. Do not breast-feed an infant while taking this medicine or for 2 weeks after the last dose. Check with your doctor or health care professional if you get an attack of severe diarrhea, nausea and vomiting, or if you sweat a lot. The loss of too much body fluid can make it dangerous for you to take this medicine. You may get dizzy. Do not drive, use machinery, or do anything that needs mental alertness until you know how this medicine affects you. Do not stand or sit up quickly, especially if you are an older patient. This reduces the risk of dizzy or fainting spells. What side effects may I notice from receiving this medicine? Side effects that you should report to your doctor or health care professional as soon as possible:  allergic reactions like skin  rash, itching or hives, swelling of the face, lips, or tongue  confusion  dizziness  feeling faint or lightheaded  fever or chills  palpitations  seizures  signs and symptoms of bleeding such as bloody or black, tarry stools; red or dark-brown urine; spitting up blood or brown material that looks like coffee grounds; red spots on the skin; unusual bruising or bleeding  including from the eye, gums, or nose  signs and symptoms of a blood clot such as breathing problems; changes in vision; chest pain; severe, sudden headache; pain, swelling, warmth in the leg; trouble speaking; sudden numbness or weakness of the face, arm or leg  signs and symptoms of kidney injury like trouble passing urine or change in the amount of urine  signs and symptoms of liver injury like dark yellow or brown urine; general ill feeling or flu-like symptoms; light-colored stools; loss of appetite; nausea; right upper belly pain; unusually weak or tired; yellowing of the eyes or skin Side effects that usually do not require medical attention (report to your doctor or health care professional if they continue or are bothersome):  back pain  cough  diarrhea  headache  muscle cramps  trouble sleeping  vomiting This list may not describe all possible side effects. Call your doctor for medical advice about side effects. You may report side effects to FDA at 1-800-FDA-1088. Where should I keep my medicine? This drug is given in a hospital or clinic and will not be stored at home. NOTE: This sheet is a summary. It may not cover all possible information. If you have questions about this medicine, talk to your doctor, pharmacist, or health care provider.  2020 Elsevier/Gold Standard (2019-01-18 19:44:21)  

## 2020-04-24 NOTE — Progress Notes (Signed)
Hematology and Oncology Follow Up Visit  Rickey [name] [id number] [birth year] 79 y.o. 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 Elotuzumab/Pomalidomide/decadron -every 4 weeks- start 01/12/2018 Kyprolis/Pomalidomide/Decadron -- s/p cycle #9 -- d/c pomaldomide in 01/2019 Xpovio 80 mg po q week -- start on 12/26/2019 IVIG 40 mg IV q 6 weeks -- next dose in 04/03/2020   Interim History:  Rickey [name] is back for follow-up.  He did have a nice Thanksgiving.  He is getting ready to go to the [facility] for the wintertime.  We did make an appoint with him to be seen down at the [facility].  We are trying to see about getting his appointment a little bit sooner.  His last Kappa light chain was up a little bit.  It was 6.1 mg/dL.  His blood sugars have been doing pretty well.  He is doing okay on the Selinexor.  He has had no problems with diarrhea.  He has had no leg swelling.  He has had no rashes.  He has had no fever.  Overall, I would say his performance status is ECOG 1.    Medications:  Allergies as of 04/24/2020   No Known Allergies     Medication List       Accurate as of April 24, 2020 12:20 PM. If you have any questions, ask your nurse or doctor.        acyclovir 400 MG tablet Commonly known as: ZOVIRAX TAKE 1 TABLET BY MOUTH TWICE DAILY   aspirin 81 MG tablet Take 81 mg by mouth daily.   atorvastatin 10 MG tablet Commonly known as: LIPITOR Take 10 mg by mouth daily.   CALCIUM CARBONATE PO Take 220 mg by mouth 2 (two) times daily.   calcium-vitamin D 250-125 MG-UNIT tablet Commonly known as: OSCAL Take 1 tablet by mouth daily.   Cartridge Pump Misc Inject into the skin.   cycloSPORINE  0.05 % ophthalmic emulsion Commonly known as: RESTASIS Place 1 drop into both eyes 2 (two) times daily.   diphenhydramine-acetaminophen 25-500 MG Tabs tablet Commonly known as: TYLENOL PM Take 1 tablet by mouth at bedtime as needed.   diphenoxylate-atropine 2.5-0.025 MG tablet Commonly known as: LOMOTIL Take 1 tablet by mouth 4 (four) times daily as needed for diarrhea or loose stools.   fish oil-omega-3 fatty acids 1000 MG capsule Take 1 g by mouth 2 (two) times daily.   INSULIN LISP & LISP PROT (HUM)    insulin lispro 100 UNIT/ML injection Commonly known as: HUMALOG Inject into the skin. Insulin pump 12/21/2018 Typically 80U daily.   irbesartan-hydrochlorothiazide 300-12.5 MG tablet Commonly known as: AVALIDE Take 1 tablet by mouth daily.   lidocaine-prilocaine cream Commonly known as: EMLA Apply 1 application topically as needed.   montelukast 10 MG tablet Commonly known as: SINGULAIR Take 10 mg by mouth at bedtime.   ondansetron 4 MG disintegrating tablet Commonly known as: ZOFRAN-ODT Take 1 tablet (4 mg total) by mouth 4 (four) times daily as needed for nausea or vomiting.   ondansetron 8 MG tablet Commonly known as: ZOFRAN Take 1 tablet (8 mg total) by mouth every 8 (eight) hours as needed for nausea or vomiting.   prochlorperazine 10 MG tablet Commonly known as:  COMPAZINE TAKE 1 TABLET(10 MG) BY MOUTH EVERY 6 HOURS AS NEEDED FOR NAUSEA OR VOMITING   Vitamin D 50 MCG (2000 UT) Caps Take by mouth every morning.   Xpovio (80 MG Once Weekly) 40 MG Tbpk Generic drug: selinexor Take 80 mg by mouth once a week.       Allergies: No Known Allergies  Past Medical History, Surgical history, Social history, and Family History were reviewed and updated.  Review of Systems: Review of Systems  Constitutional: Negative.   HENT: Negative.   Eyes: Negative.   Respiratory: Negative.   Cardiovascular: Negative.   Gastrointestinal: Negative.   Genitourinary:  Negative.   Musculoskeletal: Negative.   Skin: Negative.   Neurological: Negative.   Endo/Heme/Allergies: Negative.   Psychiatric/Behavioral: Negative.      Physical Exam:  weight is 209 lb (94.8 kg). His oral temperature is 97.8 F (36.6 C). His blood pressure is 119/51 (abnormal) and his pulse is 75. His respiration is 17 and oxygen saturation is 100%.   Wt Readings from Last 3 Encounters:  04/24/20 209 lb (94.8 kg)  04/03/20 205 lb (93 kg)  03/13/20 208 lb (94.3 kg)    Physical Exam Vitals reviewed.  HENT:     Head: Normocephalic and atraumatic.  Eyes:     Pupils: Pupils are equal, round, and reactive to light.  Cardiovascular:     Rate and Rhythm: Normal rate and regular rhythm.     Heart sounds: Normal heart sounds.  Pulmonary:     Effort: Pulmonary effort is normal.     Breath sounds: Normal breath sounds.  Abdominal:     General: Bowel sounds are normal.     Palpations: Abdomen is soft.  Musculoskeletal:        General: No tenderness or deformity. Normal range of motion.     Cervical back: Normal range of motion.  Lymphadenopathy:     Cervical: No cervical adenopathy.  Skin:    General: Skin is warm and dry.     Findings: No erythema or rash.  Neurological:     Mental Status: He is alert and oriented to person, place, and time.  Psychiatric:        Behavior: Behavior normal.        Thought Content: Thought content normal.        Judgment: Judgment normal.    Lab Results  Component Value Date   WBC 3.0 (L) 04/24/2020   HGB 14.1 04/24/2020   HCT 39.3 04/24/2020   MCV 106.8 (H) 04/24/2020   PLT 64 (L) 04/24/2020   Lab Results  Component Value Date   FERRITIN 258 12/28/2018   IRON 98 12/28/2018   TIBC 253 12/28/2018   UIBC 154 12/28/2018   IRONPCTSAT 39 12/28/2018   Lab Results  Component Value Date   RBC 3.68 (L) 04/24/2020   Lab Results  Component Value Date   KPAFRELGTCHN 61.3 (H) 04/03/2020   LAMBDASER 2.2 (L) 04/03/2020   KAPLAMBRATIO  27.86 (H) 04/03/2020   Lab Results  Component Value Date   IGGSERUM 612 04/03/2020   IGA 13 (L) 04/03/2020   IGMSERUM 8 (L) 04/03/2020   Lab Results  Component Value Date   TOTALPROTELP 5.7 (L) 04/03/2020   ALBUMINELP 3.5 04/03/2020   A1GS 0.2 04/03/2020   A2GS 0.6 04/03/2020   BETS 0.8 04/03/2020   BETA2SER 0.3 04/19/2015   GAMS 0.6 04/03/2020   MSPIKE Not Observed 04/03/2020   SPEI Comment 04/03/2020     Chemistry  Component Value Date/Time   NA 135 04/24/2020 1123   NA 142 04/23/2017 1431   NA 139 07/18/2016 1135   K 3.9 04/24/2020 1123   K 4.0 04/23/2017 1431   K 4.4 07/18/2016 1135   CL 103 04/24/2020 1123   CL 105 04/23/2017 1431   CO2 25 04/24/2020 1123   CO2 28 04/23/2017 1431   CO2 27 07/18/2016 1135   BUN 27 (H) 04/24/2020 1123   BUN 20 04/23/2017 1431   BUN 12.2 07/18/2016 1135   CREATININE 1.20 04/24/2020 1123   CREATININE 1.2 04/23/2017 1431   CREATININE 0.9 07/18/2016 1135      Component Value Date/Time   CALCIUM 8.9 04/24/2020 1123   CALCIUM 9.3 04/23/2017 1431   CALCIUM 8.9 07/18/2016 1135   ALKPHOS 47 04/24/2020 1123   ALKPHOS 64 04/23/2017 1431   ALKPHOS 58 07/18/2016 1135   AST 29 04/24/2020 1123   AST 15 07/18/2016 1135   ALT 27 04/24/2020 1123   ALT 22 04/23/2017 1431   ALT 20 07/18/2016 1135   BILITOT 1.1 04/24/2020 1123   BILITOT 1.24 (H) 07/18/2016 1135      Impression and Plan: Mr. Guthridge is a very pleasant 79 yo caucasian gentleman with 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   We will go ahead with the Kyprolis.  Again, we will see about getting an appointment for him a little bit sooner down at [place].  His light chains still on the low side.  They really are going up quite slowly.  I realize the platelets are little bit on the lower side but I do not think this will be much of a problem right now.  I am not sure when we will see him back.  Usually will come back in April.  He gets treated  down in [place].  He gets very good treatment down there.   [name], MD 11/29/202112:20 PM

## 2020-04-24 NOTE — Patient Instructions (Signed)

## 2020-04-24 NOTE — Progress Notes (Signed)
Per dr ennever, okay to treat today despite labs. 

## 2020-04-25 ENCOUNTER — Telehealth: Payer: Self-pay | Admitting: Hematology & Oncology

## 2020-04-25 DIAGNOSIS — I10 Essential (primary) hypertension: Secondary | ICD-10-CM | POA: Diagnosis not present

## 2020-04-25 DIAGNOSIS — E109 Type 1 diabetes mellitus without complications: Secondary | ICD-10-CM | POA: Diagnosis not present

## 2020-04-25 DIAGNOSIS — C9000 Multiple myeloma not having achieved remission: Secondary | ICD-10-CM | POA: Diagnosis not present

## 2020-04-25 DIAGNOSIS — E785 Hyperlipidemia, unspecified: Secondary | ICD-10-CM | POA: Diagnosis not present

## 2020-04-25 DIAGNOSIS — C4491 Basal cell carcinoma of skin, unspecified: Secondary | ICD-10-CM | POA: Diagnosis not present

## 2020-04-25 LAB — PROTEIN ELECTROPHORESIS, SERUM, WITH REFLEX
A/G Ratio: 1.5 (ref 0.7–1.7)
Albumin ELP: 3.3 g/dL (ref 2.9–4.4)
Alpha-1-Globulin: 0.2 g/dL (ref 0.0–0.4)
Alpha-2-Globulin: 0.6 g/dL (ref 0.4–1.0)
Beta Globulin: 0.7 g/dL (ref 0.7–1.3)
Gamma Globulin: 0.7 g/dL (ref 0.4–1.8)
Globulin, Total: 2.2 g/dL (ref 2.2–3.9)
Total Protein ELP: 5.5 g/dL — ABNORMAL LOW (ref 6.0–8.5)

## 2020-04-25 LAB — IGG, IGA, IGM
IgA: 11 mg/dL — ABNORMAL LOW (ref 61–437)
IgG (Immunoglobin G), Serum: 790 mg/dL (ref 603–1613)
IgM (Immunoglobulin M), Srm: 6 mg/dL — ABNORMAL LOW (ref 15–143)

## 2020-04-25 LAB — KAPPA/LAMBDA LIGHT CHAINS
Kappa free light chain: 51.3 mg/L — ABNORMAL HIGH (ref 3.3–19.4)
Kappa, lambda light chain ratio: 22.3 — ABNORMAL HIGH (ref 0.26–1.65)
Lambda free light chains: 2.3 mg/L — ABNORMAL LOW (ref 5.7–26.3)

## 2020-04-25 NOTE — Telephone Encounter (Signed)
No los 11/29

## 2020-04-26 ENCOUNTER — Telehealth: Payer: Self-pay

## 2020-04-26 NOTE — Telephone Encounter (Signed)
Informed patient of lab results per Dr.Ennever. Also informed patient Dr.Ennever states since his numbers improved he was okay with him seeing Dr.Voorhes at the end of December, no need to move it closer at this time. Patient verbalized understanding and denies any other questions or concerns at this time.

## 2020-04-26 NOTE — Telephone Encounter (Signed)
-----   Message from [name], MD sent at 04/26/2020  6:27 AM EST ----- Call - the kappa light chain is actually better -- went form 61 down to 51!!  Laurey Arrow

## 2020-05-12 ENCOUNTER — Other Ambulatory Visit: Payer: Self-pay | Admitting: *Deleted

## 2020-05-12 DIAGNOSIS — C9000 Multiple myeloma not having achieved remission: Secondary | ICD-10-CM

## 2020-05-12 DIAGNOSIS — D801 Nonfamilial hypogammaglobulinemia: Secondary | ICD-10-CM

## 2020-05-12 DIAGNOSIS — N1832 Chronic kidney disease, stage 3b: Secondary | ICD-10-CM

## 2020-05-12 DIAGNOSIS — E1021 Type 1 diabetes mellitus with diabetic nephropathy: Secondary | ICD-10-CM

## 2020-05-12 DIAGNOSIS — E1022 Type 1 diabetes mellitus with diabetic chronic kidney disease: Secondary | ICD-10-CM

## 2020-05-12 DIAGNOSIS — C9002 Multiple myeloma in relapse: Secondary | ICD-10-CM

## 2020-05-15 ENCOUNTER — Encounter: Payer: Self-pay | Admitting: Hematology & Oncology

## 2020-05-15 ENCOUNTER — Inpatient Hospital Stay: Payer: Medicare Other

## 2020-05-15 ENCOUNTER — Inpatient Hospital Stay: Payer: Medicare Other | Attending: Hematology & Oncology

## 2020-05-15 ENCOUNTER — Inpatient Hospital Stay (HOSPITAL_BASED_OUTPATIENT_CLINIC_OR_DEPARTMENT_OTHER): Payer: Medicare Other | Admitting: Hematology & Oncology

## 2020-05-15 ENCOUNTER — Encounter: Payer: Self-pay | Admitting: *Deleted

## 2020-05-15 ENCOUNTER — Other Ambulatory Visit: Payer: Self-pay

## 2020-05-15 VITALS — Wt 208.0 lb

## 2020-05-15 VITALS — BP 127/80 | HR 85 | Temp 97.7°F | Resp 18

## 2020-05-15 DIAGNOSIS — D801 Nonfamilial hypogammaglobulinemia: Secondary | ICD-10-CM

## 2020-05-15 DIAGNOSIS — C9002 Multiple myeloma in relapse: Secondary | ICD-10-CM | POA: Diagnosis not present

## 2020-05-15 DIAGNOSIS — E1022 Type 1 diabetes mellitus with diabetic chronic kidney disease: Secondary | ICD-10-CM

## 2020-05-15 DIAGNOSIS — Z5112 Encounter for antineoplastic immunotherapy: Secondary | ICD-10-CM | POA: Insufficient documentation

## 2020-05-15 DIAGNOSIS — N1832 Chronic kidney disease, stage 3b: Secondary | ICD-10-CM

## 2020-05-15 DIAGNOSIS — C9000 Multiple myeloma not having achieved remission: Secondary | ICD-10-CM

## 2020-05-15 DIAGNOSIS — E1021 Type 1 diabetes mellitus with diabetic nephropathy: Secondary | ICD-10-CM

## 2020-05-15 LAB — CBC WITH DIFFERENTIAL (CANCER CENTER ONLY)
Abs Immature Granulocytes: 0 10*3/uL (ref 0.00–0.07)
Basophils Absolute: 0 10*3/uL (ref 0.0–0.1)
Basophils Relative: 0 %
Eosinophils Absolute: 0 10*3/uL (ref 0.0–0.5)
Eosinophils Relative: 0 %
HCT: 38 % — ABNORMAL LOW (ref 39.0–52.0)
Hemoglobin: 13.7 g/dL (ref 13.0–17.0)
Immature Granulocytes: 0 %
Lymphocytes Relative: 33 %
Lymphs Abs: 0.9 10*3/uL (ref 0.7–4.0)
MCH: 38.8 pg — ABNORMAL HIGH (ref 26.0–34.0)
MCHC: 36.1 g/dL — ABNORMAL HIGH (ref 30.0–36.0)
MCV: 107.6 fL — ABNORMAL HIGH (ref 80.0–100.0)
Monocytes Absolute: 0.4 10*3/uL (ref 0.1–1.0)
Monocytes Relative: 14 %
Neutro Abs: 1.4 10*3/uL — ABNORMAL LOW (ref 1.7–7.7)
Neutrophils Relative %: 53 %
Platelet Count: 75 10*3/uL — ABNORMAL LOW (ref 150–400)
RBC: 3.53 MIL/uL — ABNORMAL LOW (ref 4.22–5.81)
RDW: 12.5 % (ref 11.5–15.5)
WBC Count: 2.6 10*3/uL — ABNORMAL LOW (ref 4.0–10.5)
nRBC: 1.1 % — ABNORMAL HIGH (ref 0.0–0.2)

## 2020-05-15 LAB — CMP (CANCER CENTER ONLY)
ALT: 28 U/L (ref 0–44)
AST: 27 U/L (ref 15–41)
Albumin: 3.8 g/dL (ref 3.5–5.0)
Alkaline Phosphatase: 41 U/L (ref 38–126)
Anion gap: 5 (ref 5–15)
BUN: 23 mg/dL (ref 8–23)
CO2: 25 mmol/L (ref 22–32)
Calcium: 8.7 mg/dL — ABNORMAL LOW (ref 8.9–10.3)
Chloride: 106 mmol/L (ref 98–111)
Creatinine: 1.22 mg/dL (ref 0.61–1.24)
GFR, Estimated: >60 mL/min (ref >60)
Glucose, Bld: 171 mg/dL — ABNORMAL HIGH (ref 70–99)
Potassium: 4.1 mmol/L (ref 3.5–5.1)
Sodium: 136 mmol/L (ref 135–145)
Total Bilirubin: 0.9 mg/dL (ref 0.3–1.2)
Total Protein: 5.7 g/dL — ABNORMAL LOW (ref 6.5–8.1)

## 2020-05-15 LAB — LACTATE DEHYDROGENASE: LDH: 207 U/L — ABNORMAL HIGH (ref 98–192)

## 2020-05-15 MED ORDER — HEPARIN SOD (PORK) LOCK FLUSH 100 UNIT/ML IV SOLN
500.0000 [IU] | Freq: Once | INTRAVENOUS | Status: DC | PRN
  Filled 2020-05-15: qty 5

## 2020-05-15 MED ORDER — ACETAMINOPHEN 325 MG PO TABS
ORAL_TABLET | ORAL | Status: AC
  Filled 2020-05-15: qty 2

## 2020-05-15 MED ORDER — DEXTROSE 5 % IV SOLN
INTRAVENOUS | Status: DC
  Filled 2020-05-15 (×2): qty 250

## 2020-05-15 MED ORDER — PROCHLORPERAZINE MALEATE 10 MG PO TABS
ORAL_TABLET | ORAL | Status: AC
  Filled 2020-05-15: qty 1

## 2020-05-15 MED ORDER — HEPARIN SOD (PORK) LOCK FLUSH 100 UNIT/ML IV SOLN
500.0000 [IU] | Freq: Once | INTRAVENOUS | Status: AC | PRN
  Administered 2020-05-15: 500 [IU]
  Filled 2020-05-15: qty 5

## 2020-05-15 MED ORDER — DEXAMETHASONE SODIUM PHOSPHATE 100 MG/10ML IJ SOLN
20.0000 mg | Freq: Once | INTRAMUSCULAR | Status: AC
  Administered 2020-05-15: 20 mg via INTRAVENOUS
  Filled 2020-05-15: qty 2

## 2020-05-15 MED ORDER — SODIUM CHLORIDE 0.9% FLUSH
10.0000 mL | INTRAVENOUS | Status: DC | PRN
  Administered 2020-05-15: 10 mL
  Filled 2020-05-15: qty 10

## 2020-05-15 MED ORDER — DIPHENHYDRAMINE HCL 25 MG PO CAPS
ORAL_CAPSULE | ORAL | Status: AC
  Filled 2020-05-15: qty 2

## 2020-05-15 MED ORDER — ACETAMINOPHEN 325 MG PO TABS
650.0000 mg | ORAL_TABLET | Freq: Once | ORAL | Status: AC
  Administered 2020-05-15: 650 mg via ORAL

## 2020-05-15 MED ORDER — SODIUM CHLORIDE 0.9 % IV SOLN
Freq: Once | INTRAVENOUS | Status: AC
  Filled 2020-05-15: qty 250

## 2020-05-15 MED ORDER — DEXTROSE 5 % IV SOLN
70.0000 mg/m2 | Freq: Once | INTRAVENOUS | Status: AC
  Administered 2020-05-15: 150 mg via INTRAVENOUS
  Filled 2020-05-15: qty 60

## 2020-05-15 MED ORDER — PROCHLORPERAZINE MALEATE 10 MG PO TABS
10.0000 mg | ORAL_TABLET | Freq: Once | ORAL | Status: AC
  Administered 2020-05-15: 10 mg via ORAL

## 2020-05-15 MED ORDER — IMMUNE GLOBULIN (HUMAN) 20 GM/200ML IV SOLN
40.0000 g | Freq: Once | INTRAVENOUS | Status: AC
  Administered 2020-05-15: 40 g via INTRAVENOUS
  Filled 2020-05-15: qty 400

## 2020-05-15 MED ORDER — DIPHENHYDRAMINE HCL 25 MG PO CAPS
50.0000 mg | ORAL_CAPSULE | Freq: Once | ORAL | Status: AC
  Administered 2020-05-15: 50 mg via ORAL

## 2020-05-15 NOTE — Patient Instructions (Signed)

## 2020-05-15 NOTE — Progress Notes (Signed)
Hematology and Oncology Follow Up Visit  Rickey [name] [id number] 29-Jul-1940 79 y.o. 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 Elotuzumab/Pomalidomide/decadron -every 4 weeks- start 01/12/2018 Kyprolis/Pomalidomide/Decadron -- s/p cycle #9 -- d/c pomaldomide in 01/2019 Xpovio 80 mg po q week -- start on 12/26/2019 IVIG 40 mg IV q 6 weeks -- next dose in 06/2020   Interim History:  Rickey [name] is back for follow-up.  He is doing quite well.  He is at the [facility] right now.  He is spending the week with his daughter and her family in [place].  [place] then [place] and spend it with another daughter and her family.  He will see Dr. Philipp Ovens at [facility] next week.  Thankfully his light chains did not go up.  More we last checked his light chains, they are holding stable at 5.7 mg/dL.  He is doing well with the Xpovio.  He is not having diarrhea.  There is no rash.  He is having no problems with cough or shortness of breath.  We did give him IVIG.  He gets a dose today.  He has had no problems with his diabetes.  He is very diligent in monitoring his blood sugars.  There is no diarrhea.  He has had no problems urinating.  He has had no dysphagia or done aphasia.  Overall, I was his performance status is ECOG 1.    Medications:  Allergies as of 05/15/2020   No Known Allergies     Medication List       Accurate as of May 15, 2020 11:47 AM. If you have any questions, ask your nurse or doctor.        acyclovir 400 MG tablet Commonly known as: ZOVIRAX TAKE 1 TABLET BY MOUTH TWICE DAILY   aspirin 81 MG tablet Take 81 mg by mouth daily.   atorvastatin 10 MG  tablet Commonly known as: LIPITOR Take 10 mg by mouth daily.   CALCIUM CARBONATE PO Take 220 mg by mouth 2 (two) times daily.   calcium-vitamin D 250-125 MG-UNIT tablet Commonly known as: OSCAL Take 1 tablet by mouth daily.   Cartridge Pump Misc Inject into the skin.   cycloSPORINE 0.05 % ophthalmic emulsion Commonly known as: RESTASIS Place 1 drop into both eyes 2 (two) times daily.   diphenhydramine-acetaminophen 25-500 MG Tabs tablet Commonly known as: TYLENOL PM Take 1 tablet by mouth at bedtime as needed.   diphenoxylate-atropine 2.5-0.025 MG tablet Commonly known as: LOMOTIL Take 1 tablet by mouth 4 (four) times daily as needed for diarrhea or loose stools.   fish oil-omega-3 fatty acids 1000 MG capsule Take 1 g by mouth 2 (two) times daily.   INSULIN LISP & LISP PROT (HUM) [place]   insulin lispro 100 UNIT/ML injection Commonly known as: HUMALOG Inject into the skin. Insulin pump 12/21/2018 Typically 80U daily.   irbesartan-hydrochlorothiazide 300-12.5 MG tablet Commonly known as: AVALIDE Take 1 tablet by mouth daily.   lidocaine-prilocaine cream Commonly known as: EMLA Apply 1 application topically as needed.   montelukast 10 MG tablet Commonly known as: SINGULAIR Take 10 mg by mouth at bedtime.   ondansetron 4 MG disintegrating tablet Commonly known as:  ZOFRAN-ODT Take 1 tablet (4 mg total) by mouth 4 (four) times daily as needed for nausea or vomiting.   ondansetron 8 MG tablet Commonly known as: ZOFRAN Take 1 tablet (8 mg total) by mouth every 8 (eight) hours as needed for nausea or vomiting.   prochlorperazine 10 MG tablet Commonly known as: COMPAZINE TAKE 1 TABLET(10 MG) BY MOUTH EVERY 6 HOURS AS NEEDED FOR NAUSEA OR VOMITING   Vitamin D 50 MCG (2000 UT) Caps Take by mouth every morning.   Xpovio (80 MG Once Weekly) 40 MG Tbpk Generic drug: selinexor Take 80 mg by mouth once a week.       Allergies: No Known Allergies  Past Medical  History, Surgical history, Social history, and Family History were reviewed and updated.  Review of Systems: Review of Systems  Constitutional: Negative.   HENT: Negative.   Eyes: Negative.   Respiratory: Negative.   Cardiovascular: Negative.   Gastrointestinal: Negative.   Genitourinary: Negative.   Musculoskeletal: Negative.   Skin: Negative.   Neurological: Negative.   Endo/Heme/Allergies: Negative.   Psychiatric/Behavioral: Negative.      Physical Exam:  weight is 208 lb (94.3 kg).   Wt Readings from Last 3 Encounters:  05/15/20 208 lb (94.3 kg)  04/24/20 209 lb (94.8 kg)  04/03/20 205 lb (93 kg)    Physical Exam Vitals reviewed.  HENT:     Head: Normocephalic and atraumatic.  Eyes:     Pupils: Pupils are equal, round, and reactive to light.  Cardiovascular:     Rate and Rhythm: Normal rate and regular rhythm.     Heart sounds: Normal heart sounds.  Pulmonary:     Effort: Pulmonary effort is normal.     Breath sounds: Normal breath sounds.  Abdominal:     General: Bowel sounds are normal.     Palpations: Abdomen is soft.  Musculoskeletal:        General: No tenderness or deformity. Normal range of motion.     Cervical back: Normal range of motion.  Lymphadenopathy:     Cervical: No cervical adenopathy.  Skin:    General: Skin is warm and dry.     Findings: No erythema or rash.  Neurological:     Mental Status: He is alert and oriented to person, place, and time.  Psychiatric:        Behavior: Behavior normal.        Thought Content: Thought content normal.        Judgment: Judgment normal.    Lab Results  Component Value Date   WBC 2.6 (L) 05/15/2020   HGB 13.7 05/15/2020   HCT 38.0 (L) 05/15/2020   MCV 107.6 (H) 05/15/2020   PLT 75 (L) 05/15/2020   Lab Results  Component Value Date   FERRITIN 258 12/28/2018   IRON 98 12/28/2018   TIBC 253 12/28/2018   UIBC 154 12/28/2018   IRONPCTSAT 39 12/28/2018   Lab Results  Component Value Date    RBC 3.53 (L) 05/15/2020   Lab Results  Component Value Date   KPAFRELGTCHN 51.3 (H) 04/24/2020   LAMBDASER 2.3 (L) 04/24/2020   KAPLAMBRATIO 22.30 (H) 04/24/2020   Lab Results  Component Value Date   IGGSERUM 790 04/24/2020   IGA 11 (L) 04/24/2020   IGMSERUM 6 (L) 04/24/2020   Lab Results  Component Value Date   TOTALPROTELP 5.5 (L) 04/24/2020   ALBUMINELP 3.3 04/24/2020   A1GS 0.2 04/24/2020   A2GS 0.6 04/24/2020   BETS  0.7 04/24/2020   BETA2SER 0.3 04/19/2015   GAMS 0.7 04/24/2020   MSPIKE Not Observed 04/24/2020   SPEI Comment 04/03/2020     Chemistry      Component Value Date/Time   NA 136 05/15/2020 1010   NA 142 04/23/2017 1431   NA 139 07/18/2016 1135   K 4.1 05/15/2020 1010   K 4.0 04/23/2017 1431   K 4.4 07/18/2016 1135   CL 106 05/15/2020 1010   CL 105 04/23/2017 1431   CO2 25 05/15/2020 1010   CO2 28 04/23/2017 1431   CO2 27 07/18/2016 1135   BUN 23 05/15/2020 1010   BUN 20 04/23/2017 1431   BUN 12.2 07/18/2016 1135   CREATININE 1.22 05/15/2020 1010   CREATININE 1.2 04/23/2017 1431   CREATININE 0.9 07/18/2016 1135      Component Value Date/Time   CALCIUM 8.7 (L) 05/15/2020 1010   CALCIUM 9.3 04/23/2017 1431   CALCIUM 8.9 07/18/2016 1135   ALKPHOS 41 05/15/2020 1010   ALKPHOS 64 04/23/2017 1431   ALKPHOS 58 07/18/2016 1135   AST 27 05/15/2020 1010   AST 15 07/18/2016 1135   ALT 28 05/15/2020 1010   ALT 22 04/23/2017 1431   ALT 20 07/18/2016 1135   BILITOT 0.9 05/15/2020 1010   BILITOT 1.24 (H) 07/18/2016 1135      Impression and Plan: Rickey [name] is a very pleasant 79 yo caucasian gentleman with 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   We will go ahead with the Kyprolis.  His platelet count is little bit on the lower side but better than before.  He has done well with the lower blood counts.  Hopefully, he will be able to qualify for clinical trial.  I know that he hates having to come in so often for  treatment.  If he can get a newer treatment that is less often and may be a one-time treatment that would certainly appeal to him.  It would not surprise me if he needs a bone marrow biopsy again.  I am sure that Dr. Philipp Ovens will let me know how we can assist and what protocols he might qualify for.   We will hold off any follow-up appointments until we know what is going gone down in [place].  [name], MD 12/20/202111:47 AM

## 2020-05-15 NOTE — Progress Notes (Signed)
Ok to treat despite labs per MD

## 2020-05-15 NOTE — Progress Notes (Signed)
Dr. Antonieta Pert office notes faxed from May 2021 to present to Dr. Irven Easterly at [facility] at [phone] per pt.'s request.

## 2020-05-15 NOTE — Patient Instructions (Addendum)
Immune Globulin Injection What is this medicine? IMMUNE GLOBULIN (im MUNE GLOB yoo lin) helps to prevent or reduce the severity of certain infections in patients who are at risk. This medicine is collected from the pooled blood of many donors. It is used to treat immune system problems, thrombocytopenia, and Kawasaki syndrome. This medicine may be used for other purposes; ask your health care provider or pharmacist if you have questions. COMMON BRAND NAME(S): ASCENIV, Baygam, BIVIGAM, Carimune, Carimune NF, cutaquig, Cuvitru, Flebogamma, Flebogamma DIF, GamaSTAN, GamaSTAN S/D, Gamimune N, Gammagard, Gammagard S/D, Gammaked, Gammaplex, Gammar-P IV, Gamunex, Gamunex-C, Hizentra, Iveegam, Iveegam EN, Octagam, Panglobulin, Panglobulin NF, panzyga, Polygam S/D, Privigen, Sandoglobulin, Venoglobulin-S, Vigam, Vivaglobulin, Xembify What should I tell my health care provider before I take this medicine? They need to know if you have any of these conditions:  diabetes  extremely low or no immune antibodies in the blood  heart disease  history of blood clots  hyperprolinemia  infection in the blood, sepsis  kidney disease  recently received or scheduled to receive a vaccination  an unusual or allergic reaction to human immune globulin, albumin, maltose, sucrose, other medicines, foods, dyes, or preservatives  pregnant or trying to get pregnant  breast-feeding How should I use this medicine? This medicine is for injection into a muscle or infusion into a vein or skin. It is usually given by a health care professional in a hospital or clinic setting. In rare cases, some brands of this medicine might be given at home. You will be taught how to give this medicine. Use exactly as directed. Take your medicine at regular intervals. Do not take your medicine more often than directed. Talk to your pediatrician regarding the use of this medicine in children. While this drug may be prescribed for selected  conditions, precautions do apply. Overdosage: If you think you have taken too much of this medicine contact a poison control center or emergency room at once. NOTE: This medicine is only for you. Do not share this medicine with others. What if I miss a dose? It is important not to miss your dose. Call your doctor or health care professional if you are unable to keep an appointment. If you give yourself the medicine and you miss a dose, take it as soon as you can. If it is almost time for your next dose, take only that dose. Do not take double or extra doses. What may interact with this medicine?  aspirin and aspirin-like medicines  cisplatin  cyclosporine  medicines for infection like acyclovir, adefovir, amphotericin B, bacitracin, cidofovir, foscarnet, ganciclovir, gentamicin, pentamidine, vancomycin  NSAIDS, medicines for pain and inflammation, like ibuprofen or naproxen  pamidronate  vaccines  zoledronic acid This list may not describe all possible interactions. Give your health care provider a list of all the medicines, herbs, non-prescription drugs, or dietary supplements you use. Also tell them if you smoke, drink alcohol, or use illegal drugs. Some items may interact with your medicine. What should I watch for while using this medicine? Your condition will be monitored carefully while you are receiving this medicine. This medicine is made from pooled blood donations of many different people. It may be possible to pass an infection in this medicine. However, the donors are screened for infections and all products are tested for HIV and hepatitis. The medicine is treated to kill most or all bacteria and viruses. Talk to your doctor about the risks and benefits of this medicine. Do not have vaccinations for at least  14 days before, or until at least 3 months after receiving this medicine. What side effects may I notice from receiving this medicine? Side effects that you should report  to your doctor or health care professional as soon as possible:  allergic reactions like skin rash, itching or hives, swelling of the face, lips, or tongue  blue colored lips or skin  breathing problems  chest pain or tightness  fever  signs and symptoms of aseptic meningitis such as stiff neck; sensitivity to light; headache; drowsiness; fever; nausea; vomiting; rash  signs and symptoms of a blood clot such as chest pain; shortness of breath; pain, swelling, or warmth in the leg  signs and symptoms of hemolytic anemia such as fast heartbeat; tiredness; dark yellow or brown urine; or yellowing of the eyes or skin  signs and symptoms of kidney injury like trouble passing urine or change in the amount of urine  sudden weight gain  swelling of the ankles, feet, hands Side effects that usually do not require medical attention (report to your doctor or health care professional if they continue or are bothersome):  diarrhea  flushing  headache  increased sweating  joint pain  muscle cramps  muscle pain  nausea  pain, redness, or irritation at site where injected  tiredness This list may not describe all possible side effects. Call your doctor for medical advice about side effects. You may report side effects to FDA at 1-800-FDA-1088. Where should I keep my medicine? Keep out of the reach of children. This drug is usually given in a hospital or clinic and will not be stored at home. In rare cases, some brands of this medicine may be given at home. If you are using this medicine at home, you will be instructed on how to store this medicine. Throw away any unused medicine after the expiration date on the label. NOTE: This sheet is a summary. It may not cover all possible information. If you have questions about this medicine, talk to your doctor, pharmacist, or health care provider.  2020 Elsevier/Gold Standard (2018-12-16 12:51:14) Carfilzomib injection What is this  medicine? CARFILZOMIB (kar FILZ oh mib) targets a specific protein within cancer cells and stops the cancer cells from growing. It is used to treat multiple myeloma. This medicine may be used for other purposes; ask your health care provider or pharmacist if you have questions. COMMON BRAND NAME(S): KYPROLIS What should I tell my health care provider before I take this medicine? They need to know if you have any of these conditions:  heart disease  history of blood clots  irregular heartbeat  kidney disease  liver disease  lung or breathing disease  an unusual or allergic reaction to carfilzomib, or other medicines, foods, dyes, or preservatives  pregnant or trying to get pregnant  breast-feeding How should I use this medicine? This medicine is for injection or infusion into a vein. It is given by a health care professional in a hospital or clinic setting. Talk to your pediatrician regarding the use of this medicine in children. Special care may be needed. Overdosage: If you think you have taken too much of this medicine contact a poison control center or emergency room at once. NOTE: This medicine is only for you. Do not share this medicine with others. What if I miss a dose? It is important not to miss your dose. Call your doctor or health care professional if you are unable to keep an appointment. What may interact with this  medicine? Interactions are not expected. Give your health care provider a list of all the medicines, herbs, non-prescription drugs, or dietary supplements you use. Also tell them if you smoke, drink alcohol, or use illegal drugs. Some items may interact with your medicine. This list may not describe all possible interactions. Give your health care provider a list of all the medicines, herbs, non-prescription drugs, or dietary supplements you use. Also tell them if you smoke, drink alcohol, or use illegal drugs. Some items may interact with your  medicine. What should I watch for while using this medicine? Your condition will be monitored carefully while you are receiving this medicine. Report any side effects. Continue your course of treatment even though you feel ill unless your doctor tells you to stop. You may need blood work done while you are taking this medicine. Do not become pregnant while taking this medicine or for at least 6 months after stopping it. Women should inform their doctor if they wish to become pregnant or think they might be pregnant. There is a potential for serious side effects to an unborn child. Men should not father a child while taking this medicine and for at least 3 months after stopping it. Talk to your health care professional or pharmacist for more information. Do not breast-feed an infant while taking this medicine or for 2 weeks after the last dose. Check with your doctor or health care professional if you get an attack of severe diarrhea, nausea and vomiting, or if you sweat a lot. The loss of too much body fluid can make it dangerous for you to take this medicine. You may get dizzy. Do not drive, use machinery, or do anything that needs mental alertness until you know how this medicine affects you. Do not stand or sit up quickly, especially if you are an older patient. This reduces the risk of dizzy or fainting spells. What side effects may I notice from receiving this medicine? Side effects that you should report to your doctor or health care professional as soon as possible:  allergic reactions like skin rash, itching or hives, swelling of the face, lips, or tongue  confusion  dizziness  feeling faint or lightheaded  fever or chills  palpitations  seizures  signs and symptoms of bleeding such as bloody or black, tarry stools; red or dark-brown urine; spitting up blood or brown material that looks like coffee grounds; red spots on the skin; unusual bruising or bleeding including from the eye,  gums, or nose  signs and symptoms of a blood clot such as breathing problems; changes in vision; chest pain; severe, sudden headache; pain, swelling, warmth in the leg; trouble speaking; sudden numbness or weakness of the face, arm or leg  signs and symptoms of kidney injury like trouble passing urine or change in the amount of urine  signs and symptoms of liver injury like dark yellow or brown urine; general ill feeling or flu-like symptoms; light-colored stools; loss of appetite; nausea; right upper belly pain; unusually weak or tired; yellowing of the eyes or skin Side effects that usually do not require medical attention (report to your doctor or health care professional if they continue or are bothersome):  back pain  cough  diarrhea  headache  muscle cramps  trouble sleeping  vomiting This list may not describe all possible side effects. Call your doctor for medical advice about side effects. You may report side effects to FDA at 1-800-FDA-1088. Where should I keep my medicine? This  drug is given in a hospital or clinic and will not be stored at home. NOTE: This sheet is a summary. It may not cover all possible information. If you have questions about this medicine, talk to your doctor, pharmacist, or health care provider.  2020 Elsevier/Gold Standard (2019-01-18 19:44:21)

## 2020-05-16 ENCOUNTER — Telehealth: Payer: Self-pay

## 2020-05-16 ENCOUNTER — Telehealth: Payer: Self-pay | Admitting: *Deleted

## 2020-05-16 LAB — PROTEIN ELECTROPHORESIS, SERUM
A/G Ratio: 1.7 (ref 0.7–1.7)
Albumin ELP: 3.5 g/dL (ref 2.9–4.4)
Alpha-1-Globulin: 0.2 g/dL (ref 0.0–0.4)
Alpha-2-Globulin: 0.6 g/dL (ref 0.4–1.0)
Beta Globulin: 0.7 g/dL (ref 0.7–1.3)
Gamma Globulin: 0.5 g/dL (ref 0.4–1.8)
Globulin, Total: 2.1 g/dL — ABNORMAL LOW (ref 2.2–3.9)
Total Protein ELP: 5.6 g/dL — ABNORMAL LOW (ref 6.0–8.5)

## 2020-05-16 LAB — KAPPA/LAMBDA LIGHT CHAINS
Kappa free light chain: 43.1 mg/L — ABNORMAL HIGH (ref 3.3–19.4)
Kappa, lambda light chain ratio: 22.68 — ABNORMAL HIGH (ref 0.26–1.65)
Lambda free light chains: 1.9 mg/L — ABNORMAL LOW (ref 5.7–26.3)

## 2020-05-16 LAB — IGG, IGA, IGM
IgA: 10 mg/dL — ABNORMAL LOW (ref 61–437)
IgG (Immunoglobin G), Serum: 556 mg/dL — ABNORMAL LOW (ref 603–1613)
IgM (Immunoglobulin M), Srm: <5 mg/dL — ABNORMAL LOW (ref 15–143)

## 2020-05-16 NOTE — Telephone Encounter (Signed)
No 05/15/20 los entered... AOM

## 2020-05-16 NOTE — Telephone Encounter (Signed)
-----   Message from [name], MD sent at 05/16/2020  1:54 PM EST ----- Call - the light chains keep going down!!!!  A Christmas miracle!!!  Laurey Arrow

## 2020-05-16 NOTE — Telephone Encounter (Signed)
Notified pt of results. Pt verbalized understanding.

## 2020-05-23 ENCOUNTER — Other Ambulatory Visit: Payer: Self-pay | Admitting: *Deleted

## 2020-05-23 DIAGNOSIS — C9000 Multiple myeloma not having achieved remission: Secondary | ICD-10-CM

## 2020-05-23 MED ORDER — XPOVIO (80 MG ONCE WEEKLY) 40 MG PO TBPK: 80.0000 mg | ORAL_TABLET | ORAL | 4 refills | Status: DC

## 2020-05-25 DIAGNOSIS — R6889 Other general symptoms and signs: Secondary | ICD-10-CM | POA: Diagnosis not present

## 2020-05-25 DIAGNOSIS — C9000 Multiple myeloma not having achieved remission: Secondary | ICD-10-CM | POA: Diagnosis not present

## 2020-06-08 ENCOUNTER — Encounter: Payer: Self-pay | Admitting: Hematology & Oncology

## 2020-07-13 ENCOUNTER — Encounter: Payer: Self-pay | Admitting: Hematology & Oncology

## 2020-07-28 ENCOUNTER — Encounter: Payer: Self-pay | Admitting: Hematology & Oncology

## 2020-08-16 ENCOUNTER — Encounter: Payer: Self-pay | Admitting: Hematology & Oncology

## 2020-10-11 ENCOUNTER — Other Ambulatory Visit: Payer: Self-pay

## 2020-10-11 ENCOUNTER — Inpatient Hospital Stay (HOSPITAL_BASED_OUTPATIENT_CLINIC_OR_DEPARTMENT_OTHER): Payer: Medicare Other | Admitting: Hematology & Oncology

## 2020-10-11 ENCOUNTER — Inpatient Hospital Stay: Payer: Medicare Other

## 2020-10-11 ENCOUNTER — Telehealth: Payer: Self-pay

## 2020-10-11 ENCOUNTER — Inpatient Hospital Stay: Payer: Medicare Other | Attending: Hematology & Oncology

## 2020-10-11 VITALS — BP 148/88 | HR 85 | Resp 16

## 2020-10-11 DIAGNOSIS — E119 Type 2 diabetes mellitus without complications: Secondary | ICD-10-CM | POA: Diagnosis not present

## 2020-10-11 DIAGNOSIS — Z5112 Encounter for antineoplastic immunotherapy: Secondary | ICD-10-CM | POA: Insufficient documentation

## 2020-10-11 DIAGNOSIS — C9000 Multiple myeloma not having achieved remission: Secondary | ICD-10-CM | POA: Diagnosis not present

## 2020-10-11 DIAGNOSIS — C9002 Multiple myeloma in relapse: Secondary | ICD-10-CM | POA: Diagnosis present

## 2020-10-11 LAB — CMP (CANCER CENTER ONLY)
ALT: 32 U/L (ref 0–44)
AST: 29 U/L (ref 15–41)
Albumin: 3.8 g/dL (ref 3.5–5.0)
Alkaline Phosphatase: 68 U/L (ref 38–126)
Anion gap: 7 (ref 5–15)
BUN: 16 mg/dL (ref 8–23)
CO2: 26 mmol/L (ref 22–32)
Calcium: 9.2 mg/dL (ref 8.9–10.3)
Chloride: 107 mmol/L (ref 98–111)
Creatinine: 1.12 mg/dL (ref 0.61–1.24)
GFR, Estimated: >60 mL/min (ref >60)
Glucose, Bld: 150 mg/dL — ABNORMAL HIGH (ref 70–99)
Potassium: 4.1 mmol/L (ref 3.5–5.1)
Sodium: 140 mmol/L (ref 135–145)
Total Bilirubin: 0.8 mg/dL (ref 0.3–1.2)
Total Protein: 5.4 g/dL — ABNORMAL LOW (ref 6.5–8.1)

## 2020-10-11 LAB — CBC WITH DIFFERENTIAL (CANCER CENTER ONLY)
Abs Immature Granulocytes: 0.01 10*3/uL (ref 0.00–0.07)
Basophils Absolute: 0 10*3/uL (ref 0.0–0.1)
Basophils Relative: 1 %
Eosinophils Absolute: 0.1 10*3/uL (ref 0.0–0.5)
Eosinophils Relative: 2 %
HCT: 47.6 % (ref 39.0–52.0)
Hemoglobin: 16.5 g/dL (ref 13.0–17.0)
Immature Granulocytes: 0 %
Lymphocytes Relative: 31 %
Lymphs Abs: 1.7 10*3/uL (ref 0.7–4.0)
MCH: 33.9 pg (ref 26.0–34.0)
MCHC: 34.7 g/dL (ref 30.0–36.0)
MCV: 97.7 fL (ref 80.0–100.0)
Monocytes Absolute: 0.9 10*3/uL (ref 0.1–1.0)
Monocytes Relative: 16 %
Neutro Abs: 2.8 10*3/uL (ref 1.7–7.7)
Neutrophils Relative %: 50 %
Platelet Count: 134 10*3/uL — ABNORMAL LOW (ref 150–400)
RBC: 4.87 MIL/uL (ref 4.22–5.81)
RDW: 12.7 % (ref 11.5–15.5)
WBC Count: 5.4 10*3/uL (ref 4.0–10.5)
nRBC: 0 % (ref 0.0–0.2)

## 2020-10-11 LAB — LACTATE DEHYDROGENASE: LDH: 187 U/L (ref 98–192)

## 2020-10-11 MED ORDER — DARATUMUMAB-HYALURONIDASE-FIHJ 1800-30000 MG-UT/15ML ~~LOC~~ SOLN
1800.0000 mg | Freq: Once | SUBCUTANEOUS | Status: AC
  Administered 2020-10-11: 1800 mg via SUBCUTANEOUS
  Filled 2020-10-11: qty 15

## 2020-10-11 MED ORDER — ACETAMINOPHEN 325 MG PO TABS
ORAL_TABLET | ORAL | Status: AC
  Filled 2020-10-11: qty 2

## 2020-10-11 MED ORDER — METHYLPREDNISOLONE SODIUM SUCC 125 MG IJ SOLR
100.0000 mg | Freq: Once | INTRAMUSCULAR | Status: DC
Start: 2020-10-11 — End: 2020-10-11

## 2020-10-11 MED ORDER — DEXAMETHASONE 4 MG PO TABS
ORAL_TABLET | ORAL | Status: AC
  Filled 2020-10-11: qty 3

## 2020-10-11 MED ORDER — METHYLPREDNISOLONE SODIUM SUCC 125 MG IJ SOLR
INTRAMUSCULAR | Status: AC
  Filled 2020-10-11: qty 2

## 2020-10-11 MED ORDER — ONDANSETRON HCL 8 MG PO TABS: 8.0000 mg | ORAL_TABLET | Freq: Two times a day (BID) | ORAL | 1 refills | Status: DC | PRN

## 2020-10-11 MED ORDER — DIPHENHYDRAMINE HCL 25 MG PO CAPS
50.0000 mg | ORAL_CAPSULE | Freq: Once | ORAL | Status: AC
  Administered 2020-10-11: 50 mg via ORAL

## 2020-10-11 MED ORDER — ACETAMINOPHEN 325 MG PO TABS
650.0000 mg | ORAL_TABLET | Freq: Once | ORAL | Status: AC
  Administered 2020-10-11: 650 mg via ORAL

## 2020-10-11 MED ORDER — ACETAMINOPHEN 325 MG PO TABS: 650.0000 mg | ORAL_TABLET | Freq: Once | ORAL | Status: DC

## 2020-10-11 MED ORDER — SODIUM CHLORIDE 0.9 % IV SOLN: 16.0000 mg/kg | Freq: Once | INTRAVENOUS | Status: DC

## 2020-10-11 MED ORDER — MONTELUKAST SODIUM 10 MG PO TABS
10.0000 mg | ORAL_TABLET | Freq: Every day | ORAL | Status: DC
  Filled 2020-10-11: qty 1

## 2020-10-11 MED ORDER — ACYCLOVIR 400 MG PO TABS: 400.0000 mg | ORAL_TABLET | Freq: Two times a day (BID) | ORAL | 11 refills | Status: DC

## 2020-10-11 MED ORDER — LIDOCAINE-PRILOCAINE 2.5-2.5 % EX CREA: TOPICAL_CREAM | CUTANEOUS | 3 refills | Status: DC

## 2020-10-11 MED ORDER — DIPHENHYDRAMINE HCL 25 MG PO CAPS: 50.0000 mg | ORAL_CAPSULE | Freq: Once | ORAL | Status: DC

## 2020-10-11 MED ORDER — DEXAMETHASONE 4 MG PO TABS: 4.0000 mg | ORAL_TABLET | Freq: Every day | ORAL | 4 refills | Status: DC

## 2020-10-11 MED ORDER — PROCHLORPERAZINE MALEATE 10 MG PO TABS: 10.0000 mg | ORAL_TABLET | Freq: Four times a day (QID) | ORAL | 1 refills | Status: DC | PRN

## 2020-10-11 MED ORDER — LORAZEPAM 0.5 MG PO TABS: 0.5000 mg | ORAL_TABLET | Freq: Four times a day (QID) | ORAL | 0 refills | Status: DC | PRN

## 2020-10-11 MED ORDER — SODIUM CHLORIDE 0.9 % IV SOLN
Freq: Once | INTRAVENOUS | Status: DC
  Filled 2020-10-11: qty 250

## 2020-10-11 MED ORDER — DIPHENHYDRAMINE HCL 25 MG PO CAPS
ORAL_CAPSULE | ORAL | Status: AC
  Filled 2020-10-11: qty 2

## 2020-10-11 MED ORDER — DEXAMETHASONE 4 MG PO TABS
8.0000 mg | ORAL_TABLET | Freq: Once | ORAL | Status: AC
  Administered 2020-10-11: 8 mg via ORAL

## 2020-10-11 NOTE — Telephone Encounter (Signed)
appts made per 10/11/20 los and pt will gain a new sch in tx/avs and through mychart   Marshall Kampf 

## 2020-10-11 NOTE — Patient Instructions (Signed)

## 2020-10-11 NOTE — Progress Notes (Signed)
Hematology and Oncology Follow Up Visit  Rickey [name] [id number] 02-13-1941 80 y.o. 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 Elotuzumab/Pomalidomide/decadron -every 4 weeks- start 01/12/2018 Kyprolis/Pomalidomide/Decadron -- s/p cycle #9 -- d/c pomaldomide in 01/2019 Xpovio 80 mg po q week -- start on 12/26/2019 - d/c on 04/2020 Daratumumab -started cycle 1 in January 2022   Interim History:  Rickey [name] is back for follow-up.  It must be all May because he is now back from the beach.  He goes down to the beach every winter.  While he was down there, he was treated and [place].  He is on single agent daratumumab.  He is active doing quite well with this.  He is seen Dr. Philipp Ovens down at the living [place].  Dr. Philipp Ovens feels that he would be a good candidate for CAR-T therapy if the daratumumab stops working.  Rickey [name] is under good control with his diabetes.  He has a pump on.  He has been quite active.  [place]  [place] there.  He has had no problems with nausea or vomiting.  There is been no problems with diarrhea or constipation.  He has had no rashes.  There is been no leg swelling.  Overall, I would have to say his performance status is ECOG 1.    Medications:  Allergies as of 10/11/2020   No Known Allergies     Medication List       Accurate as of Oct 11, 2020 10:42 AM. If you have any questions, ask your nurse or doctor.        acyclovir 400 MG tablet Commonly known as: ZOVIRAX TAKE 1 TABLET BY MOUTH TWICE DAILY   aspirin 81 MG tablet Take 81 mg by mouth daily.   atorvastatin 10 MG tablet Commonly known as: LIPITOR Take 10 mg by mouth daily.    CALCIUM CARBONATE PO Take 220 mg by mouth 2 (two) times daily.   calcium-vitamin D 250-125 MG-UNIT tablet Commonly known as: OSCAL Take 1 tablet by mouth daily.   Cartridge Pump Misc Inject into the skin.   cycloSPORINE 0.05 % ophthalmic emulsion Commonly known as: RESTASIS Place 1 drop into both eyes 2 (two) times daily.   diphenhydramine-acetaminophen 25-500 MG Tabs tablet Commonly known as: TYLENOL PM Take 1 tablet by mouth at bedtime as needed.   diphenoxylate-atropine 2.5-0.025 MG tablet Commonly known as: LOMOTIL Take 1 tablet by mouth 4 (four) times daily as needed for diarrhea or loose stools.   fish oil-omega-3 fatty acids 1000 MG capsule Take 1 g by mouth 2 (two) times daily.   INSULIN LISP & LISP PROT (HUM) [place]   insulin lispro 100 UNIT/ML injection Commonly known as: HUMALOG Inject into the skin. Insulin pump 12/21/2018 Typically 80U daily.   irbesartan-hydrochlorothiazide 300-12.5 MG tablet Commonly known as: AVALIDE Take 1 tablet by mouth daily.   lidocaine-prilocaine cream Commonly known as: EMLA Apply 1 application topically as needed.   montelukast 10 MG tablet Commonly known as: SINGULAIR Take 10 mg by mouth at bedtime.   ondansetron 4 MG disintegrating tablet Commonly known as: ZOFRAN-ODT Take 1 tablet (4 mg total) by mouth 4 (four) times daily  as needed for nausea or vomiting.   ondansetron 8 MG tablet Commonly known as: ZOFRAN Take 1 tablet (8 mg total) by mouth every 8 (eight) hours as needed for nausea or vomiting.   prochlorperazine 10 MG tablet Commonly known as: COMPAZINE TAKE 1 TABLET(10 MG) BY MOUTH EVERY 6 HOURS AS NEEDED FOR NAUSEA OR VOMITING   Vitamin D 50 MCG (2000 UT) Caps Take by mouth every morning.   Xpovio (80 MG Once Weekly) 40 MG Tbpk Generic drug: selinexor Take 80 mg by mouth once a week.       Allergies: No Known Allergies  Past Medical History, Surgical history, Social history, and Family History were  reviewed and updated.  Review of Systems: Review of Systems  Constitutional: Negative.   HENT: Negative.   Eyes: Negative.   Respiratory: Negative.   Cardiovascular: Negative.   Gastrointestinal: Negative.   Genitourinary: Negative.   Musculoskeletal: Negative.   Skin: Negative.   Neurological: Negative.   Endo/Heme/Allergies: Negative.   Psychiatric/Behavioral: Negative.      Physical Exam:  vitals were not taken for this visit.   Wt Readings from Last 3 Encounters:  05/15/20 208 lb (94.3 kg)  04/24/20 209 lb (94.8 kg)  04/03/20 205 lb (93 kg)    Physical Exam Vitals reviewed.  HENT:     Head: Normocephalic and atraumatic.  Eyes:     Pupils: Pupils are equal, round, and reactive to light.  Cardiovascular:     Rate and Rhythm: Normal rate and regular rhythm.     Heart sounds: Normal heart sounds.  Pulmonary:     Effort: Pulmonary effort is normal.     Breath sounds: Normal breath sounds.  Abdominal:     General: Bowel sounds are normal.     Palpations: Abdomen is soft.  Musculoskeletal:        General: No tenderness or deformity. Normal range of motion.     Cervical back: Normal range of motion.  Lymphadenopathy:     Cervical: No cervical adenopathy.  Skin:    General: Skin is warm and dry.     Findings: No erythema or rash.  Neurological:     Mental Status: He is alert and oriented to person, place, and time.  Psychiatric:        Behavior: Behavior normal.        Thought Content: Thought content normal.        Judgment: Judgment normal.    Lab Results  Component Value Date   WBC 5.4 10/11/2020   HGB 16.5 10/11/2020   HCT 47.6 10/11/2020   MCV 97.7 10/11/2020   PLT 134 (L) 10/11/2020   Lab Results  Component Value Date   FERRITIN 258 12/28/2018   IRON 98 12/28/2018   TIBC 253 12/28/2018   UIBC 154 12/28/2018   IRONPCTSAT 39 12/28/2018   Lab Results  Component Value Date   RBC 4.87 10/11/2020   Lab Results  Component Value Date    KPAFRELGTCHN 43.1 (H) 05/15/2020   LAMBDASER 1.9 (L) 05/15/2020   KAPLAMBRATIO 22.68 (H) 05/15/2020   Lab Results  Component Value Date   IGGSERUM 556 (L) 05/15/2020   IGA 10 (L) 05/15/2020   IGMSERUM <5 (L) 05/15/2020   Lab Results  Component Value Date   TOTALPROTELP 5.6 (L) 05/15/2020   ALBUMINELP 3.5 05/15/2020   A1GS 0.2 05/15/2020   A2GS 0.6 05/15/2020   BETS 0.7 05/15/2020   BETA2SER 0.3 04/19/2015   GAMS 0.5 05/15/2020   MSPIKE  Not Observed 05/15/2020   SPEI Comment 05/15/2020     Chemistry      Component Value Date/Time   NA 136 05/15/2020 1010   NA 142 04/23/2017 1431   NA 139 07/18/2016 1135   K 4.1 05/15/2020 1010   K 4.0 04/23/2017 1431   K 4.4 07/18/2016 1135   CL 106 05/15/2020 1010   CL 105 04/23/2017 1431   CO2 25 05/15/2020 1010   CO2 28 04/23/2017 1431   CO2 27 07/18/2016 1135   BUN 23 05/15/2020 1010   BUN 20 04/23/2017 1431   BUN 12.2 07/18/2016 1135   CREATININE 1.22 05/15/2020 1010   CREATININE 1.2 04/23/2017 1431   CREATININE 0.9 07/18/2016 1135      Component Value Date/Time   CALCIUM 8.7 (L) 05/15/2020 1010   CALCIUM 9.3 04/23/2017 1431   CALCIUM 8.9 07/18/2016 1135   ALKPHOS 41 05/15/2020 1010   ALKPHOS 64 04/23/2017 1431   ALKPHOS 58 07/18/2016 1135   AST 27 05/15/2020 1010   AST 15 07/18/2016 1135   ALT 28 05/15/2020 1010   ALT 22 04/23/2017 1431   ALT 20 07/18/2016 1135   BILITOT 0.9 05/15/2020 1010   BILITOT 1.24 (H) 07/18/2016 1135      Impression and Plan: Rickey [name] is a very pleasant 80 yo caucasian gentleman with 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   We will continue him on the daratumumab.  This apparently is working quite nicely.  I am very happy for him.  We will see what his Kappa light chain level is.  He obviously is being followed down by Dr. Philipp Ovens very closely.  We will certainly work in conjunction so that we can make sure that if we have to move to a more aggressive  therapy, we shall.  He is still on the every 2-week dosing of daratumumab.  He will have this for another 4 treatments and then he will go to monthly.  The monthly will certainly make life much better for him [place]  I will see him back in 1 month.   [name], MD 5/18/202210:42 AM

## 2020-10-12 LAB — KAPPA/LAMBDA LIGHT CHAINS
Kappa free light chain: 20.2 mg/L — ABNORMAL HIGH (ref 3.3–19.4)
Kappa, lambda light chain ratio: 10.63 — ABNORMAL HIGH (ref 0.26–1.65)
Lambda free light chains: 1.9 mg/L — ABNORMAL LOW (ref 5.7–26.3)

## 2020-10-12 LAB — IGG, IGA, IGM
IgA: 6 mg/dL — ABNORMAL LOW (ref 61–437)
IgG (Immunoglobin G), Serum: 318 mg/dL — ABNORMAL LOW (ref 603–1613)
IgM (Immunoglobulin M), Srm: <5 mg/dL — ABNORMAL LOW (ref 15–143)

## 2020-10-16 LAB — PROTEIN ELECTROPHORESIS, SERUM, WITH REFLEX
A/G Ratio: 1.9 — ABNORMAL HIGH (ref 0.7–1.7)
Albumin ELP: 3.5 g/dL (ref 2.9–4.4)
Alpha-1-Globulin: 0.2 g/dL (ref 0.0–0.4)
Alpha-2-Globulin: 0.7 g/dL (ref 0.4–1.0)
Beta Globulin: 0.7 g/dL (ref 0.7–1.3)
Gamma Globulin: 0.2 g/dL — ABNORMAL LOW (ref 0.4–1.8)
Globulin, Total: 1.8 g/dL — ABNORMAL LOW (ref 2.2–3.9)
M-Spike, %: 0.1 g/dL — ABNORMAL HIGH
SPEP Interpretation: 0
Total Protein ELP: 5.3 g/dL — ABNORMAL LOW (ref 6.0–8.5)

## 2020-10-16 LAB — IMMUNOFIXATION REFLEX, SERUM
IgA: <5 mg/dL — ABNORMAL LOW (ref 61–437)
IgG (Immunoglobin G), Serum: 290 mg/dL — ABNORMAL LOW (ref 603–1613)
IgM (Immunoglobulin M), Srm: 7 mg/dL — ABNORMAL LOW (ref 15–143)

## 2020-10-25 ENCOUNTER — Inpatient Hospital Stay: Payer: Medicare Other

## 2020-10-25 ENCOUNTER — Other Ambulatory Visit: Payer: Self-pay | Admitting: *Deleted

## 2020-10-25 DIAGNOSIS — C9002 Multiple myeloma in relapse: Secondary | ICD-10-CM

## 2020-10-25 DIAGNOSIS — C9000 Multiple myeloma not having achieved remission: Secondary | ICD-10-CM

## 2020-10-26 ENCOUNTER — Inpatient Hospital Stay: Payer: Medicare Other

## 2020-10-26 ENCOUNTER — Other Ambulatory Visit: Payer: Self-pay

## 2020-10-26 ENCOUNTER — Inpatient Hospital Stay: Payer: Medicare Other | Attending: Hematology & Oncology

## 2020-10-26 VITALS — BP 125/61 | HR 77 | Temp 97.7°F | Resp 17

## 2020-10-26 DIAGNOSIS — Z5112 Encounter for antineoplastic immunotherapy: Secondary | ICD-10-CM | POA: Insufficient documentation

## 2020-10-26 DIAGNOSIS — C9000 Multiple myeloma not having achieved remission: Secondary | ICD-10-CM

## 2020-10-26 DIAGNOSIS — C9002 Multiple myeloma in relapse: Secondary | ICD-10-CM | POA: Insufficient documentation

## 2020-10-26 DIAGNOSIS — Z95828 Presence of other vascular implants and grafts: Secondary | ICD-10-CM

## 2020-10-26 LAB — CBC WITH DIFFERENTIAL (CANCER CENTER ONLY)
Abs Immature Granulocytes: 0.02 10*3/uL (ref 0.00–0.07)
Basophils Absolute: 0.1 10*3/uL (ref 0.0–0.1)
Basophils Relative: 1 %
Eosinophils Absolute: 0.1 10*3/uL (ref 0.0–0.5)
Eosinophils Relative: 2 %
HCT: 47.4 % (ref 39.0–52.0)
Hemoglobin: 16.2 g/dL (ref 13.0–17.0)
Immature Granulocytes: 0 %
Lymphocytes Relative: 30 %
Lymphs Abs: 1.7 10*3/uL (ref 0.7–4.0)
MCH: 33.1 pg (ref 26.0–34.0)
MCHC: 34.2 g/dL (ref 30.0–36.0)
MCV: 96.9 fL (ref 80.0–100.0)
Monocytes Absolute: 0.8 10*3/uL (ref 0.1–1.0)
Monocytes Relative: 14 %
Neutro Abs: 3 10*3/uL (ref 1.7–7.7)
Neutrophils Relative %: 53 %
Platelet Count: 127 10*3/uL — ABNORMAL LOW (ref 150–400)
RBC: 4.89 MIL/uL (ref 4.22–5.81)
RDW: 13.1 % (ref 11.5–15.5)
WBC Count: 5.7 10*3/uL (ref 4.0–10.5)
nRBC: 0 % (ref 0.0–0.2)

## 2020-10-26 LAB — CMP (CANCER CENTER ONLY)
ALT: 35 U/L (ref 0–44)
AST: 31 U/L (ref 15–41)
Albumin: 3.9 g/dL (ref 3.5–5.0)
Alkaline Phosphatase: 73 U/L (ref 38–126)
Anion gap: 7 (ref 5–15)
BUN: 21 mg/dL (ref 8–23)
CO2: 26 mmol/L (ref 22–32)
Calcium: 9.3 mg/dL (ref 8.9–10.3)
Chloride: 104 mmol/L (ref 98–111)
Creatinine: 1.06 mg/dL (ref 0.61–1.24)
GFR, Estimated: >60 mL/min (ref >60)
Glucose, Bld: 226 mg/dL — ABNORMAL HIGH (ref 70–99)
Potassium: 3.8 mmol/L (ref 3.5–5.1)
Sodium: 137 mmol/L (ref 135–145)
Total Bilirubin: 1 mg/dL (ref 0.3–1.2)
Total Protein: 5.5 g/dL — ABNORMAL LOW (ref 6.5–8.1)

## 2020-10-26 MED ORDER — DEXAMETHASONE 4 MG PO TABS
8.0000 mg | ORAL_TABLET | Freq: Once | ORAL | Status: AC
  Administered 2020-10-26: 8 mg via ORAL

## 2020-10-26 MED ORDER — DIPHENHYDRAMINE HCL 25 MG PO CAPS
ORAL_CAPSULE | ORAL | Status: AC
  Filled 2020-10-26: qty 2

## 2020-10-26 MED ORDER — DIPHENHYDRAMINE HCL 25 MG PO CAPS
50.0000 mg | ORAL_CAPSULE | Freq: Once | ORAL | Status: AC
  Administered 2020-10-26: 50 mg via ORAL

## 2020-10-26 MED ORDER — DARATUMUMAB-HYALURONIDASE-FIHJ 1800-30000 MG-UT/15ML ~~LOC~~ SOLN
1800.0000 mg | Freq: Once | SUBCUTANEOUS | Status: AC
  Administered 2020-10-26: 1800 mg via SUBCUTANEOUS
  Filled 2020-10-26: qty 15

## 2020-10-26 MED ORDER — ACETAMINOPHEN 325 MG PO TABS
650.0000 mg | ORAL_TABLET | Freq: Once | ORAL | Status: AC
  Administered 2020-10-26: 650 mg via ORAL

## 2020-10-26 MED ORDER — DEXAMETHASONE 4 MG PO TABS
ORAL_TABLET | ORAL | Status: AC
  Filled 2020-10-26: qty 2

## 2020-10-26 MED ORDER — SODIUM CHLORIDE 0.9% FLUSH
10.0000 mL | INTRAVENOUS | Status: DC | PRN
  Administered 2020-10-26: 10 mL via INTRAVENOUS
  Filled 2020-10-26: qty 10

## 2020-10-26 MED ORDER — ACETAMINOPHEN 325 MG PO TABS
ORAL_TABLET | ORAL | Status: AC
  Filled 2020-10-26: qty 2

## 2020-10-26 MED ORDER — HEPARIN SOD (PORK) LOCK FLUSH 100 UNIT/ML IV SOLN
500.0000 [IU] | Freq: Once | INTRAVENOUS | Status: AC
  Administered 2020-10-26: 500 [IU] via INTRAVENOUS
  Filled 2020-10-26: qty 5

## 2020-10-26 NOTE — Patient Instructions (Signed)
Daratumumab injection What is this medicine? DARATUMUMAB (dar a toom ue mab) is a monoclonal antibody. It is used to treat multiple myeloma. This medicine may be used for other purposes; ask your health care provider or pharmacist if you have questions. COMMON BRAND NAME(S): DARZALEX What should I tell my health care provider before I take this medicine? They need to know if you have any of these conditions:  hereditary fructose intolerance  infection (especially a virus infection such as chickenpox, herpes, or hepatitis B virus)  lung or breathing disease (asthma, COPD)  an unusual or allergic reaction to daratumumab, sorbitol, other medicines, foods, dyes, or preservatives  pregnant or trying to get pregnant  breast-feeding How should I use this medicine? This medicine is for infusion into a vein. It is given by a health care professional in a hospital or clinic setting. Talk to your pediatrician regarding the use of this medicine in children. Special care may be needed. Overdosage: If you think you have taken too much of this medicine contact a poison control center or emergency room at once. NOTE: This medicine is only for you. Do not share this medicine with others. What if I miss a dose? Keep appointments for follow-up doses as directed. It is important not to miss your dose. Call your doctor or health care professional if you are unable to keep an appointment. What may interact with this medicine? Interactions have not been studied. This list may not describe all possible interactions. Give your health care provider a list of all the medicines, herbs, non-prescription drugs, or dietary supplements you use. Also tell them if you smoke, drink alcohol, or use illegal drugs. Some items may interact with your medicine. What should I watch for while using this medicine? Your condition will be monitored carefully while you are receiving this medicine. This medicine can cause serious  allergic reactions. To reduce your risk, your health care provider may give you other medicine to take before receiving this one. Be sure to follow the directions from your health care provider. This medicine can affect the results of blood tests to match your blood type. These changes can last for up to 6 months after the final dose. Your healthcare provider will do blood tests to match your blood type before you start treatment. Tell all of your healthcare providers that you are being treated with this medicine before receiving a blood transfusion. This medicine can affect the results of some tests used to determine treatment response; extra tests may be needed to evaluate response. Do not become pregnant while taking this medicine or for 3 months after stopping it. Women should inform their health care provider if they wish to become pregnant or think they might be pregnant. There is a potential for serious side effects to an unborn child. Talk to your health care provider for more information. Do not breast-feed an infant while taking this medicine. What side effects may I notice from receiving this medicine? Side effects that you should report to your doctor or health care professional as soon as possible:  allergic reactions (skin rash; itching or hives; swelling of the face, lips, or tongue)  infection (fever, chills, cough, sore throat, pain or difficulty passing urine)  infusion reaction (dizziness, fast heartbeat, feeling faint or lightheaded, falls, headache, increase in blood pressure, nausea, vomiting, or wheezing or trouble breathing with loud or whistling sounds)  unusual bleeding or bruising Side effects that usually do not require medical attention (report to your doctor  or health care professional if they continue or are bothersome):  constipation  diarrhea  pain, tingling, numbness in the hands or feet  swelling of the ankles, feet, hands  tiredness This list may not  describe all possible side effects. Call your doctor for medical advice about side effects. You may report side effects to FDA at 1-800-FDA-1088. Where should I keep my medicine? This drug is given in a hospital or clinic and will not be stored at home. NOTE: This sheet is a summary. It may not cover all possible information. If you have questions about this medicine, talk to your doctor, pharmacist, or health care provider.  2021 Elsevier/Gold Standard (2020-05-04 13:28:52)

## 2020-10-31 ENCOUNTER — Telehealth: Payer: Self-pay

## 2020-10-31 NOTE — Telephone Encounter (Signed)
S/w pt and changed his appts to allow for a longer tx per 10/31/20 sch message      Larhonda Dettloff

## 2020-11-07 ENCOUNTER — Other Ambulatory Visit: Payer: Medicare Other

## 2020-11-07 ENCOUNTER — Inpatient Hospital Stay: Payer: Medicare Other

## 2020-11-07 ENCOUNTER — Inpatient Hospital Stay (HOSPITAL_BASED_OUTPATIENT_CLINIC_OR_DEPARTMENT_OTHER): Payer: Medicare Other | Admitting: Family

## 2020-11-07 ENCOUNTER — Encounter: Payer: Self-pay | Admitting: Family

## 2020-11-07 ENCOUNTER — Ambulatory Visit: Payer: Medicare Other

## 2020-11-07 ENCOUNTER — Telehealth: Payer: Self-pay

## 2020-11-07 ENCOUNTER — Other Ambulatory Visit: Payer: Self-pay

## 2020-11-07 VITALS — BP 146/59 | HR 82 | Temp 97.7°F | Resp 18 | Ht 72.0 in | Wt 210.4 lb

## 2020-11-07 VITALS — BP 141/67 | HR 69

## 2020-11-07 DIAGNOSIS — D801 Nonfamilial hypogammaglobulinemia: Secondary | ICD-10-CM | POA: Diagnosis not present

## 2020-11-07 DIAGNOSIS — C9000 Multiple myeloma not having achieved remission: Secondary | ICD-10-CM | POA: Diagnosis not present

## 2020-11-07 DIAGNOSIS — Z5112 Encounter for antineoplastic immunotherapy: Secondary | ICD-10-CM | POA: Diagnosis not present

## 2020-11-07 LAB — CBC WITH DIFFERENTIAL (CANCER CENTER ONLY)
Abs Immature Granulocytes: 0.02 10*3/uL (ref 0.00–0.07)
Basophils Absolute: 0.1 10*3/uL (ref 0.0–0.1)
Basophils Relative: 1 %
Eosinophils Absolute: 0.1 10*3/uL (ref 0.0–0.5)
Eosinophils Relative: 2 %
HCT: 49.7 % (ref 39.0–52.0)
Hemoglobin: 17 g/dL (ref 13.0–17.0)
Immature Granulocytes: 0 %
Lymphocytes Relative: 23 %
Lymphs Abs: 1.5 10*3/uL (ref 0.7–4.0)
MCH: 33.1 pg (ref 26.0–34.0)
MCHC: 34.2 g/dL (ref 30.0–36.0)
MCV: 96.9 fL (ref 80.0–100.0)
Monocytes Absolute: 0.9 10*3/uL (ref 0.1–1.0)
Monocytes Relative: 14 %
Neutro Abs: 4 10*3/uL (ref 1.7–7.7)
Neutrophils Relative %: 60 %
Platelet Count: 148 10*3/uL — ABNORMAL LOW (ref 150–400)
RBC: 5.13 MIL/uL (ref 4.22–5.81)
RDW: 13.2 % (ref 11.5–15.5)
WBC Count: 6.6 10*3/uL (ref 4.0–10.5)
nRBC: 0 % (ref 0.0–0.2)

## 2020-11-07 LAB — CMP (CANCER CENTER ONLY)
ALT: 33 U/L (ref 0–44)
AST: 29 U/L (ref 15–41)
Albumin: 4 g/dL (ref 3.5–5.0)
Alkaline Phosphatase: 75 U/L (ref 38–126)
Anion gap: 7 (ref 5–15)
BUN: 21 mg/dL (ref 8–23)
CO2: 25 mmol/L (ref 22–32)
Calcium: 8.9 mg/dL (ref 8.9–10.3)
Chloride: 108 mmol/L (ref 98–111)
Creatinine: 1.17 mg/dL (ref 0.61–1.24)
GFR, Estimated: >60 mL/min (ref >60)
Glucose, Bld: 154 mg/dL — ABNORMAL HIGH (ref 70–99)
Potassium: 4.2 mmol/L (ref 3.5–5.1)
Sodium: 140 mmol/L (ref 135–145)
Total Bilirubin: 0.8 mg/dL (ref 0.3–1.2)
Total Protein: 5.4 g/dL — ABNORMAL LOW (ref 6.5–8.1)

## 2020-11-07 LAB — LACTATE DEHYDROGENASE: LDH: 200 U/L — ABNORMAL HIGH (ref 98–192)

## 2020-11-07 MED ORDER — HEPARIN SOD (PORK) LOCK FLUSH 100 UNIT/ML IV SOLN
250.0000 [IU] | Freq: Once | INTRAVENOUS | Status: AC | PRN
  Administered 2020-11-07: 500 [IU]
  Filled 2020-11-07: qty 5

## 2020-11-07 MED ORDER — DEXTROSE 5 % IV SOLN
INTRAVENOUS | Status: DC
  Filled 2020-11-07: qty 250

## 2020-11-07 MED ORDER — ACETAMINOPHEN 325 MG PO TABS
650.0000 mg | ORAL_TABLET | Freq: Once | ORAL | Status: AC
  Administered 2020-11-07: 650 mg via ORAL

## 2020-11-07 MED ORDER — SODIUM CHLORIDE 0.9% FLUSH
10.0000 mL | Freq: Once | INTRAVENOUS | Status: AC
  Administered 2020-11-07: 10 mL via INTRAVENOUS
  Filled 2020-11-07: qty 10

## 2020-11-07 MED ORDER — IMMUNE GLOBULIN (HUMAN) 20 GM/200ML IV SOLN
40.0000 g | Freq: Once | INTRAVENOUS | Status: AC
Start: 2020-11-07 — End: 2020-11-07
  Administered 2020-11-07: 40 g via INTRAVENOUS
  Filled 2020-11-07: qty 400

## 2020-11-07 MED ORDER — DEXAMETHASONE 4 MG PO TABS
8.0000 mg | ORAL_TABLET | Freq: Once | ORAL | Status: AC
  Administered 2020-11-07: 8 mg via ORAL

## 2020-11-07 MED ORDER — SODIUM CHLORIDE 0.9% FLUSH
3.0000 mL | Freq: Once | INTRAVENOUS | Status: DC | PRN
  Filled 2020-11-07: qty 10

## 2020-11-07 MED ORDER — DIPHENHYDRAMINE HCL 25 MG PO CAPS
ORAL_CAPSULE | ORAL | Status: AC
  Filled 2020-11-07: qty 2

## 2020-11-07 MED ORDER — ACETAMINOPHEN 325 MG PO TABS
ORAL_TABLET | ORAL | Status: AC
  Filled 2020-11-07: qty 2

## 2020-11-07 MED ORDER — DEXAMETHASONE 4 MG PO TABS
ORAL_TABLET | ORAL | Status: AC
  Filled 2020-11-07: qty 2

## 2020-11-07 MED ORDER — DARATUMUMAB-HYALURONIDASE-FIHJ 1800-30000 MG-UT/15ML ~~LOC~~ SOLN
1800.0000 mg | Freq: Once | SUBCUTANEOUS | Status: AC
  Administered 2020-11-07: 1800 mg via SUBCUTANEOUS
  Filled 2020-11-07: qty 15

## 2020-11-07 MED ORDER — DIPHENHYDRAMINE HCL 25 MG PO CAPS
50.0000 mg | ORAL_CAPSULE | Freq: Once | ORAL | Status: AC
  Administered 2020-11-07: 50 mg via ORAL

## 2020-11-07 NOTE — Telephone Encounter (Signed)
Appts made per 11/07/20 los and pt tyo gain sch in tx/avs   Rickey [name]

## 2020-11-07 NOTE — Progress Notes (Signed)
Hematology and Oncology Follow Up Visit  Rickey [name] [id number] 1940-12-23 80 y.o. 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 Elotuzumab/Pomalidomide/decadron -every 4 weeks- start 01/12/2018 Kyprolis/Pomalidomide/Decadron -- s/p cycle #9 -- d/c pomaldomide in 01/2019 Xpovio 80 mg po q week -- start on 12/26/2019 - d/c on 04/2020   Current Therapy:        Daratumumab - started in January 2022, s/p cycle  IVIG every 6 weeks as indicated    Interim History:  Rickey [name] is here today for follow-up and treatment. He is doing well but has fatigue most days.  In May, M-spike was 0.1 g/dL, IgG level 318 and kappa light chains 2.02 mg/dL.  He is back from the beach living in [place] for the summer.  No fever, chills, n/v, cough, rash, dizziness, SOB, chest pain, palpitations, abdominal pain or changes in bowel or bladder habits.  No blood loss noted. No bruising or petechiae.  No swelling, tenderness, numbness or tingling in his extremities.  No falls or syncope to report.  She has maintained a good appetite and is staying well hydrated. His weight is stable at 210 lbs.   ECOG Performance Status: 1 - Symptomatic but completely ambulatory  Medications:  Allergies as of 11/07/2020       Reactions   Ace Inhibitors    Other reaction(s): cough        Medication List        Accurate as of November 07, 2020 10:20 AM. If you have any questions, ask your nurse or doctor.          STOP taking these medications    diphenoxylate-atropine 2.5-0.025 MG tablet Commonly known as: LOMOTIL Stopped by: Laverna Peace, NP   ondansetron 4 MG disintegrating tablet Commonly known as: ZOFRAN-ODT Stopped by: Laverna Peace, NP    ondansetron 8 MG tablet Commonly known as: ZOFRAN Stopped by: Laverna Peace, NP   prochlorperazine 10 MG tablet Commonly known as: COMPAZINE Stopped by: Laverna Peace, NP       TAKE these medications    acyclovir 400 MG tablet Commonly known as: ZOVIRAX 2 (two) times daily.   acyclovir 400 MG tablet Commonly known as: ZOVIRAX TAKE 1 TABLET BY MOUTH TWICE DAILY   aspirin 81 MG tablet Take 81 mg by mouth daily.   aspirin 81 MG EC tablet daily.   atorvastatin 10 MG tablet Commonly known as: LIPITOR Take 1 tablet by mouth daily.   atorvastatin 10 MG tablet Commonly known as: LIPITOR Take 10 mg by mouth daily.   B-D INS SYR ULTRAFINE .5CC/30G 30G X 1/2" 0.5 ML Misc Generic drug: Insulin Syringe-Needle U-100 Use as directed with insulin 4-5 times a day   CALCIUM CARBONATE PO Take 220 mg by mouth 2 (two) times daily.   calcium citrate-vitamin D 315-200 MG-UNIT tablet Commonly known as: CITRACAL+D one tablet BID   calcium-vitamin D 250-125 MG-UNIT tablet Commonly known as: OSCAL Take 1 tablet by mouth daily.   Cartridge Pump Misc Inject into the skin.   cycloSPORINE 0.05 % ophthalmic emulsion Commonly known as: RESTASIS Place 1 drop into both eyes 2 (two) times daily.   Darzalex 400 MG/20ML Generic drug: daratumumab See admin instructions.   diphenhydramine-acetaminophen 25-500 MG Tabs tablet Commonly known  as: TYLENOL PM Take 1 tablet by mouth at bedtime as needed.   fish oil-omega-3 fatty acids 1000 MG capsule Take 1 g by mouth 2 (two) times daily.   freestyle lancets Test blood sugars 3 times a day as directed.  E10.9   FREESTYLE LITE test strip Generic drug: glucose blood Test CBG's 3 times a day or as directed  E10.9   INSULIN LISP & LISP PROT (HUM) [place]   insulin lispro 100 UNIT/ML injection Commonly known as: HUMALOG Inject into the skin. Insulin pump 12/21/2018 Typically 80U daily.   irbesartan-hydrochlorothiazide 300-12.5 MG  tablet Commonly known as: AVALIDE Take 1 tablet by mouth daily.   lidocaine-prilocaine cream Commonly known as: EMLA Apply 1 application topically as needed.   melatonin 3 MG Tabs tablet Take 1 tab po qd   montelukast 10 MG tablet Commonly known as: SINGULAIR Take 1 tablet by mouth daily.   montelukast 10 MG tablet Commonly known as: SINGULAIR Take 10 mg by mouth at bedtime.   Vitamin D 50 MCG (2000 UT) Caps Take by mouth every morning.   Vitamin K2-Vitamin D3 45-2000 MCG-UNIT Caps daily.   zolpidem 5 MG tablet Commonly known as: AMBIEN Take 5 mg by mouth at bedtime.        Allergies:  Allergies  Allergen Reactions   Ace Inhibitors     Other reaction(s): cough    Past Medical History, Surgical history, Social history, and Family History were reviewed and updated.  Review of Systems: All other 10 point review of systems is negative.   Physical Exam:  height is 6' (1.829 m) and weight is 210 lb 6.4 oz (95.4 kg). His oral temperature is 97.7 F (36.5 C). His blood pressure is 146/59 (abnormal) and his pulse is 82. His respiration is 18 and oxygen saturation is 100%.   Wt Readings from Last 3 Encounters:  11/07/20 210 lb 6.4 oz (95.4 kg)  05/15/20 208 lb (94.3 kg)  04/24/20 209 lb (94.8 kg)    Ocular: Sclerae unicteric, pupils equal, round and reactive to light Ear-nose-throat: Oropharynx clear, dentition fair Lymphatic: No cervical or supraclavicular adenopathy Lungs no rales or rhonchi, good excursion bilaterally Heart regular rate and rhythm, no murmur appreciated Abd soft, nontender, positive bowel sounds MSK no focal spinal tenderness, no joint edema Neuro: non-focal, well-oriented, appropriate affect Breasts: Deferred   Lab Results  Component Value Date   WBC 6.6 11/07/2020   HGB 17.0 11/07/2020   HCT 49.7 11/07/2020   MCV 96.9 11/07/2020   PLT 148 (L) 11/07/2020   Lab Results  Component Value Date   FERRITIN 258 12/28/2018   IRON 98  12/28/2018   TIBC 253 12/28/2018   UIBC 154 12/28/2018   IRONPCTSAT 39 12/28/2018   Lab Results  Component Value Date   RBC 5.13 11/07/2020   Lab Results  Component Value Date   KPAFRELGTCHN 20.2 (H) 10/11/2020   LAMBDASER 1.9 (L) 10/11/2020   KAPLAMBRATIO 10.63 (H) 10/11/2020   Lab Results  Component Value Date   IGGSERUM 318 (L) 10/11/2020   IGA 6 (L) 10/11/2020   IGMSERUM <5 (L) 10/11/2020   Lab Results  Component Value Date   TOTALPROTELP 5.3 (L) 10/11/2020   ALBUMINELP 3.5 10/11/2020   A1GS 0.2 10/11/2020   A2GS 0.7 10/11/2020   BETS 0.7 10/11/2020   BETA2SER 0.3 04/19/2015   GAMS 0.2 (L) 10/11/2020   MSPIKE 0.1 (H) 10/11/2020   SPEI Comment 05/15/2020     Chemistry  Component Value Date/Time   NA 140 11/07/2020 0930   NA 142 04/23/2017 1431   NA 139 07/18/2016 1135   K 4.2 11/07/2020 0930   K 4.0 04/23/2017 1431   K 4.4 07/18/2016 1135   CL 108 11/07/2020 0930   CL 105 04/23/2017 1431   CO2 25 11/07/2020 0930   CO2 28 04/23/2017 1431   CO2 27 07/18/2016 1135   BUN 21 11/07/2020 0930   BUN 20 04/23/2017 1431   BUN 12.2 07/18/2016 1135   CREATININE 1.17 11/07/2020 0930   CREATININE 1.2 04/23/2017 1431   CREATININE 0.9 07/18/2016 1135      Component Value Date/Time   CALCIUM 8.9 11/07/2020 0930   CALCIUM 9.3 04/23/2017 1431   CALCIUM 8.9 07/18/2016 1135   ALKPHOS 75 11/07/2020 0930   ALKPHOS 64 04/23/2017 1431   ALKPHOS 58 07/18/2016 1135   AST 29 11/07/2020 0930   AST 15 07/18/2016 1135   ALT 33 11/07/2020 0930   ALT 22 04/23/2017 1431   ALT 20 07/18/2016 1135   BILITOT 0.8 11/07/2020 0930   BILITOT 1.24 (H) 07/18/2016 1135       Impression and Plan:  Mr. Serano is a very pleasant 80 yo caucasian gentleman with 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 He is tolerating Daratumumab nicely and will resume IVIG today as well.  He is also followed by Dr. Philipp Ovens in [place].  We will proceed with treatment  today as planned. He will have lab and Daratumumab treatment again in 2 weeks and then go to a monthly lab/treatment schedule.  Follow-up in 6 weeks per MD. He can contact our office with any questions or concerns.    Laverna Peace, NP 6/14/202210:20 AM

## 2020-11-07 NOTE — Patient Instructions (Signed)

## 2020-11-07 NOTE — Patient Instructions (Signed)
Daratumumab injection What is this medication? DARATUMUMAB (dar a toom ue mab) is a monoclonal antibody. It is used to treatmultiple myeloma. This medicine may be used for other purposes; ask your health care provider orpharmacist if you have questions. COMMON BRAND NAME(S): DARZALEX What should I tell my care team before I take this medication? They need to know if you have any of these conditions: hereditary fructose intolerance infection (especially a virus infection such as chickenpox, herpes, or hepatitis B virus) lung or breathing disease (asthma, COPD) an unusual or allergic reaction to daratumumab, sorbitol, other medicines, foods, dyes, or preservatives pregnant or trying to get pregnant breast-feeding How should I use this medication? This medicine is for infusion into a vein. It is given by a health careprofessional in a hospital or clinic setting. Talk to your pediatrician regarding the use of this medicine in children.Special care may be needed. Overdosage: If you think you have taken too much of this medicine contact apoison control center or emergency room at once. NOTE: This medicine is only for you. Do not share this medicine with others. What if I miss a dose? Keep appointments for follow-up doses as directed. It is important not to miss your dose. Call your doctor or health care professional if you are unable tokeep an appointment. What may interact with this medication? Interactions have not been studied. This list may not describe all possible interactions. Give your health care provider a list of all the medicines, herbs, non-prescription drugs, or dietary supplements you use. Also tell them if you smoke, drink alcohol, or use illegaldrugs. Some items may interact with your medicine. What should I watch for while using this medication? Your condition will be monitored carefully while you are receiving thismedicine. This medicine can cause serious allergic reactions. To  reduce your risk, your health care provider may give you other medicine to take before receiving thisone. Be sure to follow the directions from your health care provider. This medicine can affect the results of blood tests to match your blood type. These changes can last for up to 6 months after the final dose. Your healthcare provider will do blood tests to match your blood type before you start treatment. Tell all of your healthcare providers that you are being treatedwith this medicine before receiving a blood transfusion. This medicine can affect the results of some tests used to determine treatmentresponse; extra tests may be needed to evaluate response. Do not become pregnant while taking this medicine or for 3 months after stopping it. Women should inform their health care provider if they wish to become pregnant or think they might be pregnant. There is a potential for serious side effects to an unborn child. Talk to your health care provider formore information. Do not breast-feed an infant while taking this medicine. What side effects may I notice from receiving this medication? Side effects that you should report to your doctor or health care professionalas soon as possible: allergic reactions (skin rash, itching, hives, swelling of the face, lips, or tongue) blurred vision infection (fever, chills, cough, sore throat, pain or difficulty passing urine) infusion reaction (dizziness, fast heartbeat, feeling faint or lightheaded, falls, headache, increase in blood pressure, nausea, vomiting, or wheezing or trouble breathing with loud or whistling sounds) unusual bleeding or bruising Side effects that usually do not require medical attention (report to yourdoctor or health care professional if they continue or are bothersome): constipation diarrhea pain, tingling, numbness in the hands or feet swelling of the ankles,  feet, hands tiredness This list may not describe all possible side effects.  Call your doctor for medical advice about side effects. You may report side effects to FDA at1-800-FDA-1088. Where should I keep my medication? This drug is given in a hospital or clinic and will not be stored at home. NOTE: This sheet is a summary. It may not cover all possible information. If you have questions about this medicine, talk to your doctor, pharmacist, orhealth care provider.  2022 Else[employer]/Gold Standard (2020-06-22 12:50:38) Immune Globulin Injection What is this medication? IMMUNE GLOBULIN (im MUNE GLOB yoo lin) helps to prevent or reduce the severity of certain infections in patients who are at risk. This medicine is collected from the pooled blood of many donors. It is used to treat immune systemproblems, thrombocytopenia, and Kawasaki syndrome. This medicine may be used for other purposes; ask your health care provider orpharmacist if you have questions. COMMON BRAND NAME(S): ASCENIV, Baygam, BIVIGAM, Carimune, Carimune NF, cutaquig, Cuvitru, Flebogamma, Flebogamma DIF, GamaSTAN, GamaSTAN S/D, Gamimune N, Gammagard, Gammagard S/D, Gammaked, Gammaplex, Gammar-P IV, Gamunex, Gamunex-C, Hizentra, Iveegam, Iveegam EN, Octagam, Panglobulin, Panglobulin NF, panzyga, Polygam S/D, Privigen, Sandoglobulin, Venoglobulin-S, Vigam,Vivaglobulin, Xembify What should I tell my care team before I take this medication? They need to know if you have any of these conditions: diabetes extremely low or no immune antibodies in the blood heart disease history of blood clots hyperprolinemia infection in the blood, sepsis kidney disease recently received or scheduled to receive a vaccination an unusual or allergic reaction to human immune globulin, albumin, maltose, sucrose, other medicines, foods, dyes, or preservatives pregnant or trying to get pregnant breast-feeding How should I use this medication? This medicine is for injection into a muscle or infusion into a vein or skin. It is usually  given by a health care professional in a hospital or clinicsetting. In rare cases, some brands of this medicine might be given at home. You will be taught how to give this medicine. Use exactly as directed. Take your medicineat regular intervals. Do not take your medicine more often than directed. Talk to your pediatrician regarding the use of this medicine in children. Whilethis drug may be prescribed for selected conditions, precautions do apply. Overdosage: If you think you have taken too much of this medicine contact apoison control center or emergency room at once. NOTE: This medicine is only for you. Do not share this medicine with others. What if I miss a dose? It is important not to miss your dose. Call your doctor or health care professional if you are unable to keep an appointment. If you give yourself the medicine and you miss a dose, take it as soon as you can. If it is almost timefor your next dose, take only that dose. Do not take double or extra doses. What may interact with this medication? aspirin and aspirin-like medicines cisplatin cyclosporine medicines for infection like acyclovir, adefovir, amphotericin B, bacitracin, cidofovir, foscarnet, ganciclovir, gentamicin, pentamidine, vancomycin NSAIDS, medicines for pain and inflammation, like ibuprofen or naproxen pamidronate vaccines zoledronic acid This list may not describe all possible interactions. Give your health care provider a list of all the medicines, herbs, non-prescription drugs, or dietary supplements you use. Also tell them if you smoke, drink alcohol, or use illegaldrugs. Some items may interact with your medicine. What should I watch for while using this medication? Your condition will be monitored carefully while you are receiving thismedicine. This medicine is made from pooled blood donations of many different people. It may  be possible to pass an infection in this medicine. However, the donors are screened for  infections and all products are tested for HIV and hepatitis. The medicine is treated to kill most or all bacteria and viruses. Talk to yourdoctor about the risks and benefits of this medicine. Do not have vaccinations for at least 14 days before, or until at least 46months after receiving this medicine. What side effects may I notice from receiving this medication? Side effects that you should report to your doctor or health care professionalas soon as possible: allergic reactions like skin rash, itching or hives, swelling of the face, lips, or tongue blue colored lips or skin breathing problems chest pain or tightness fever signs and symptoms of aseptic meningitis such as stiff neck; sensitivity to light; headache; drowsiness; fever; nausea; vomiting; rash signs and symptoms of a blood clot such as chest pain; shortness of breath; pain, swelling, or warmth in the leg signs and symptoms of hemolytic anemia such as fast heartbeat; tiredness; dark yellow or brown urine; or yellowing of the eyes or skin signs and symptoms of kidney injury like trouble passing urine or change in the amount of urine sudden weight gain swelling of the ankles, feet, hands Side effects that usually do not require medical attention (report to yourdoctor or health care professional if they continue or are bothersome): diarrhea flushing headache increased sweating joint pain muscle cramps muscle pain nausea pain, redness, or irritation at site where injected tiredness This list may not describe all possible side effects. Call your doctor for medical advice about side effects. You may report side effects to FDA at1-800-FDA-1088. Where should I keep my medication? Keep out of the reach of children. This drug is usually given in a hospital or clinic and will not be stored athome. In rare cases, some brands of this medicine may be given at home. If you are using this medicine at home, you will be instructed on how to  store thismedicine. Throw away any unused medicine after the expiration date on the label. NOTE: This sheet is a summary. It may not cover all possible information. If you have questions about this medicine, talk to your doctor, pharmacist, orhealth care provider.  2022 Elsevier/Gold Standard (2018-12-16 12:51:14)

## 2020-11-08 LAB — IGG, IGA, IGM
IgA: 8 mg/dL — ABNORMAL LOW (ref 61–437)
IgG (Immunoglobin G), Serum: 277 mg/dL — ABNORMAL LOW (ref 603–1613)
IgM (Immunoglobulin M), Srm: <5 mg/dL — ABNORMAL LOW (ref 15–143)

## 2020-11-08 LAB — KAPPA/LAMBDA LIGHT CHAINS
Kappa free light chain: 23.2 mg/L — ABNORMAL HIGH (ref 3.3–19.4)
Kappa, lambda light chain ratio: 12.21 — ABNORMAL HIGH (ref 0.26–1.65)
Lambda free light chains: 1.9 mg/L — ABNORMAL LOW (ref 5.7–26.3)

## 2020-11-09 LAB — PROTEIN ELECTROPHORESIS, SERUM, WITH REFLEX
A/G Ratio: 1.7 (ref 0.7–1.7)
Albumin ELP: 3.6 g/dL (ref 2.9–4.4)
Alpha-1-Globulin: 0.2 g/dL (ref 0.0–0.4)
Alpha-2-Globulin: 0.8 g/dL (ref 0.4–1.0)
Beta Globulin: 0.8 g/dL (ref 0.7–1.3)
Gamma Globulin: 0.3 g/dL — ABNORMAL LOW (ref 0.4–1.8)
Globulin, Total: 2.1 g/dL — ABNORMAL LOW (ref 2.2–3.9)
M-Spike, %: 0.1 g/dL — ABNORMAL HIGH
SPEP Interpretation: 0
Total Protein ELP: 5.7 g/dL — ABNORMAL LOW (ref 6.0–8.5)

## 2020-11-09 LAB — IMMUNOFIXATION REFLEX, SERUM
IgA: 8 mg/dL — ABNORMAL LOW (ref 61–437)
IgG (Immunoglobin G), Serum: 268 mg/dL — ABNORMAL LOW (ref 603–1613)
IgM (Immunoglobulin M), Srm: <5 mg/dL — ABNORMAL LOW (ref 15–143)

## 2020-11-23 ENCOUNTER — Inpatient Hospital Stay: Payer: Medicare Other

## 2020-11-23 ENCOUNTER — Other Ambulatory Visit: Payer: Self-pay

## 2020-11-23 VITALS — BP 131/79 | HR 64 | Temp 97.7°F | Resp 17

## 2020-11-23 DIAGNOSIS — C9000 Multiple myeloma not having achieved remission: Secondary | ICD-10-CM

## 2020-11-23 DIAGNOSIS — Z5112 Encounter for antineoplastic immunotherapy: Secondary | ICD-10-CM | POA: Diagnosis not present

## 2020-11-23 DIAGNOSIS — D801 Nonfamilial hypogammaglobulinemia: Secondary | ICD-10-CM

## 2020-11-23 LAB — CMP (CANCER CENTER ONLY)
ALT: 28 U/L (ref 0–44)
AST: 29 U/L (ref 15–41)
Albumin: 4 g/dL (ref 3.5–5.0)
Alkaline Phosphatase: 67 U/L (ref 38–126)
Anion gap: 5 (ref 5–15)
BUN: 18 mg/dL (ref 8–23)
CO2: 27 mmol/L (ref 22–32)
Calcium: 9.3 mg/dL (ref 8.9–10.3)
Chloride: 104 mmol/L (ref 98–111)
Creatinine: 0.85 mg/dL (ref 0.61–1.24)
GFR, Estimated: >60 mL/min (ref >60)
Glucose, Bld: 172 mg/dL — ABNORMAL HIGH (ref 70–99)
Potassium: 3.7 mmol/L (ref 3.5–5.1)
Sodium: 136 mmol/L (ref 135–145)
Total Bilirubin: 1 mg/dL (ref 0.3–1.2)
Total Protein: 5.9 g/dL — ABNORMAL LOW (ref 6.5–8.1)

## 2020-11-23 LAB — CBC WITH DIFFERENTIAL (CANCER CENTER ONLY)
Abs Immature Granulocytes: 0.01 10*3/uL (ref 0.00–0.07)
Basophils Absolute: 0 10*3/uL (ref 0.0–0.1)
Basophils Relative: 1 %
Eosinophils Absolute: 0.1 10*3/uL (ref 0.0–0.5)
Eosinophils Relative: 2 %
HCT: 48.8 % (ref 39.0–52.0)
Hemoglobin: 16.7 g/dL (ref 13.0–17.0)
Immature Granulocytes: 0 %
Lymphocytes Relative: 37 %
Lymphs Abs: 1.8 10*3/uL (ref 0.7–4.0)
MCH: 32.9 pg (ref 26.0–34.0)
MCHC: 34.2 g/dL (ref 30.0–36.0)
MCV: 96.1 fL (ref 80.0–100.0)
Monocytes Absolute: 0.8 10*3/uL (ref 0.1–1.0)
Monocytes Relative: 15 %
Neutro Abs: 2.2 10*3/uL (ref 1.7–7.7)
Neutrophils Relative %: 45 %
Platelet Count: 135 10*3/uL — ABNORMAL LOW (ref 150–400)
RBC: 5.08 MIL/uL (ref 4.22–5.81)
RDW: 13.5 % (ref 11.5–15.5)
WBC Count: 4.9 10*3/uL (ref 4.0–10.5)
nRBC: 0 % (ref 0.0–0.2)

## 2020-11-23 LAB — LACTATE DEHYDROGENASE: LDH: 169 U/L (ref 98–192)

## 2020-11-23 MED ORDER — DEXAMETHASONE 4 MG PO TABS
8.0000 mg | ORAL_TABLET | Freq: Once | ORAL | Status: AC
  Administered 2020-11-23: 8 mg via ORAL

## 2020-11-23 MED ORDER — HEPARIN SOD (PORK) LOCK FLUSH 100 UNIT/ML IV SOLN
500.0000 [IU] | Freq: Once | INTRAVENOUS | Status: AC
  Administered 2020-11-23: 500 [IU] via INTRAVENOUS
  Filled 2020-11-23: qty 5

## 2020-11-23 MED ORDER — DIPHENHYDRAMINE HCL 25 MG PO CAPS
50.0000 mg | ORAL_CAPSULE | Freq: Once | ORAL | Status: AC
  Administered 2020-11-23: 50 mg via ORAL

## 2020-11-23 MED ORDER — ACETAMINOPHEN 325 MG PO TABS
ORAL_TABLET | ORAL | Status: AC
  Filled 2020-11-23: qty 2

## 2020-11-23 MED ORDER — DARATUMUMAB-HYALURONIDASE-FIHJ 1800-30000 MG-UT/15ML ~~LOC~~ SOLN
1800.0000 mg | Freq: Once | SUBCUTANEOUS | Status: AC
  Administered 2020-11-23: 1800 mg via SUBCUTANEOUS
  Filled 2020-11-23: qty 15

## 2020-11-23 MED ORDER — SODIUM CHLORIDE 0.9% FLUSH
10.0000 mL | Freq: Once | INTRAVENOUS | Status: AC
  Administered 2020-11-23: 10 mL via INTRAVENOUS
  Filled 2020-11-23: qty 10

## 2020-11-23 MED ORDER — DEXAMETHASONE 4 MG PO TABS
ORAL_TABLET | ORAL | Status: AC
  Filled 2020-11-23: qty 2

## 2020-11-23 MED ORDER — EPOETIN ALFA-EPBX 40000 UNIT/ML IJ SOLN
INTRAMUSCULAR | Status: AC
  Filled 2020-11-23: qty 1

## 2020-11-23 MED ORDER — DIPHENHYDRAMINE HCL 25 MG PO CAPS
ORAL_CAPSULE | ORAL | Status: AC
  Filled 2020-11-23: qty 2

## 2020-11-23 MED ORDER — ACETAMINOPHEN 325 MG PO TABS
650.0000 mg | ORAL_TABLET | Freq: Once | ORAL | Status: AC
  Administered 2020-11-23: 650 mg via ORAL

## 2020-11-23 NOTE — Patient Instructions (Signed)

## 2020-11-23 NOTE — Patient Instructions (Signed)
Daratumumab injection What is this medication? DARATUMUMAB (dar a toom ue mab) is a monoclonal antibody. It is used to treatmultiple myeloma. This medicine may be used for other purposes; ask your health care provider orpharmacist if you have questions. COMMON BRAND NAME(S): DARZALEX What should I tell my care team before I take this medication? They need to know if you have any of these conditions: hereditary fructose intolerance infection (especially a virus infection such as chickenpox, herpes, or hepatitis B virus) lung or breathing disease (asthma, COPD) an unusual or allergic reaction to daratumumab, sorbitol, other medicines, foods, dyes, or preservatives pregnant or trying to get pregnant breast-feeding How should I use this medication? This medicine is for infusion into a vein. It is given by a health careprofessional in a hospital or clinic setting. Talk to your pediatrician regarding the use of this medicine in children.Special care may be needed. Overdosage: If you think you have taken too much of this medicine contact apoison control center or emergency room at once. NOTE: This medicine is only for you. Do not share this medicine with others. What if I miss a dose? Keep appointments for follow-up doses as directed. It is important not to miss your dose. Call your doctor or health care professional if you are unable tokeep an appointment. What may interact with this medication? Interactions have not been studied. This list may not describe all possible interactions. Give your health care provider a list of all the medicines, herbs, non-prescription drugs, or dietary supplements you use. Also tell them if you smoke, drink alcohol, or use illegaldrugs. Some items may interact with your medicine. What should I watch for while using this medication? Your condition will be monitored carefully while you are receiving thismedicine. This medicine can cause serious allergic reactions. To  reduce your risk, your health care provider may give you other medicine to take before receiving thisone. Be sure to follow the directions from your health care provider. This medicine can affect the results of blood tests to match your blood type. These changes can last for up to 6 months after the final dose. Your healthcare provider will do blood tests to match your blood type before you start treatment. Tell all of your healthcare providers that you are being treatedwith this medicine before receiving a blood transfusion. This medicine can affect the results of some tests used to determine treatmentresponse; extra tests may be needed to evaluate response. Do not become pregnant while taking this medicine or for 3 months after stopping it. Women should inform their health care provider if they wish to become pregnant or think they might be pregnant. There is a potential for serious side effects to an unborn child. Talk to your health care provider formore information. Do not breast-feed an infant while taking this medicine. What side effects may I notice from receiving this medication? Side effects that you should report to your doctor or health care professionalas soon as possible: allergic reactions (skin rash, itching, hives, swelling of the face, lips, or tongue) blurred vision infection (fever, chills, cough, sore throat, pain or difficulty passing urine) infusion reaction (dizziness, fast heartbeat, feeling faint or lightheaded, falls, headache, increase in blood pressure, nausea, vomiting, or wheezing or trouble breathing with loud or whistling sounds) unusual bleeding or bruising Side effects that usually do not require medical attention (report to yourdoctor or health care professional if they continue or are bothersome): constipation diarrhea pain, tingling, numbness in the hands or feet swelling of the ankles,  feet, hands tiredness This list may not describe all possible side effects.  Call your doctor for medical advice about side effects. You may report side effects to FDA at1-800-FDA-1088. Where should I keep my medication? This drug is given in a hospital or clinic and will not be stored at home. NOTE: This sheet is a summary. It may not cover all possible information. If you have questions about this medicine, talk to your doctor, pharmacist, orhealth care provider.  2022 [employer]/Gold Standard (2020-06-22 12:50:38) Immune Globulin Injection What is this medication? IMMUNE GLOBULIN (im MUNE GLOB yoo lin) helps to prevent or reduce the severity of certain infections in patients who are at risk. This medicine is collected from the pooled blood of many donors. It is used to treat immune systemproblems, thrombocytopenia, and Kawasaki syndrome. This medicine may be used for other purposes; ask your health care provider orpharmacist if you have questions. COMMON BRAND NAME(S): ASCENIV, Baygam, BIVIGAM, Carimune, Carimune NF, cutaquig, Cuvitru, Flebogamma, Flebogamma DIF, GamaSTAN, GamaSTAN S/D, Gamimune N, Gammagard, Gammagard S/D, Gammaked, Gammaplex, Gammar-P IV, Gamunex, Gamunex-C, Hizentra, Iveegam, Iveegam EN, Octagam, Panglobulin, Panglobulin NF, panzyga, Polygam S/D, Privigen, Sandoglobulin, Venoglobulin-S, Vigam,Vivaglobulin, Xembify What should I tell my care team before I take this medication? They need to know if you have any of these conditions: diabetes extremely low or no immune antibodies in the blood heart disease history of blood clots hyperprolinemia infection in the blood, sepsis kidney disease recently received or scheduled to receive a vaccination an unusual or allergic reaction to human immune globulin, albumin, maltose, sucrose, other medicines, foods, dyes, or preservatives pregnant or trying to get pregnant breast-feeding How should I use this medication? This medicine is for injection into a muscle or infusion into a vein or skin. It is usually  given by a health care professional in a hospital or clinicsetting. In rare cases, some brands of this medicine might be given at home. You will be taught how to give this medicine. Use exactly as directed. Take your medicineat regular intervals. Do not take your medicine more often than directed. Talk to your pediatrician regarding the use of this medicine in children. Whilethis drug may be prescribed for selected conditions, precautions do apply. Overdosage: If you think you have taken too much of this medicine contact apoison control center or emergency room at once. NOTE: This medicine is only for you. Do not share this medicine with others. What if I miss a dose? It is important not to miss your dose. Call your doctor or health care professional if you are unable to keep an appointment. If you give yourself the medicine and you miss a dose, take it as soon as you can. If it is almost timefor your next dose, take only that dose. Do not take double or extra doses. What may interact with this medication? aspirin and aspirin-like medicines cisplatin cyclosporine medicines for infection like acyclovir, adefovir, amphotericin B, bacitracin, cidofovir, foscarnet, ganciclovir, gentamicin, pentamidine, vancomycin NSAIDS, medicines for pain and inflammation, like ibuprofen or naproxen pamidronate vaccines zoledronic acid This list may not describe all possible interactions. Give your health care provider a list of all the medicines, herbs, non-prescription drugs, or dietary supplements you use. Also tell them if you smoke, drink alcohol, or use illegaldrugs. Some items may interact with your medicine. What should I watch for while using this medication? Your condition will be monitored carefully while you are receiving thismedicine. This medicine is made from pooled blood donations of many different people. It may  be possible to pass an infection in this medicine. However, the donors are screened for  infections and all products are tested for HIV and hepatitis. The medicine is treated to kill most or all bacteria and viruses. Talk to yourdoctor about the risks and benefits of this medicine. Do not have vaccinations for at least 14 days before, or until at least 55months after receiving this medicine. What side effects may I notice from receiving this medication? Side effects that you should report to your doctor or health care professionalas soon as possible: allergic reactions like skin rash, itching or hives, swelling of the face, lips, or tongue blue colored lips or skin breathing problems chest pain or tightness fever signs and symptoms of aseptic meningitis such as stiff neck; sensitivity to light; headache; drowsiness; fever; nausea; vomiting; rash signs and symptoms of a blood clot such as chest pain; shortness of breath; pain, swelling, or warmth in the leg signs and symptoms of hemolytic anemia such as fast heartbeat; tiredness; dark yellow or brown urine; or yellowing of the eyes or skin signs and symptoms of kidney injury like trouble passing urine or change in the amount of urine sudden weight gain swelling of the ankles, feet, hands Side effects that usually do not require medical attention (report to yourdoctor or health care professional if they continue or are bothersome): diarrhea flushing headache increased sweating joint pain muscle cramps muscle pain nausea pain, redness, or irritation at site where injected tiredness This list may not describe all possible side effects. Call your doctor for medical advice about side effects. You may report side effects to FDA at1-800-FDA-1088. Where should I keep my medication? Keep out of the reach of children. This drug is usually given in a hospital or clinic and will not be stored athome. In rare cases, some brands of this medicine may be given at home. If you are using this medicine at home, you will be instructed on how to  store thismedicine. Throw away any unused medicine after the expiration date on the label. NOTE: This sheet is a summary. It may not cover all possible information. If you have questions about this medicine, talk to your doctor, pharmacist, orhealth care provider.  2022 [employer]/Gold Standard (2018-12-16 12:51:14)

## 2020-11-23 NOTE — Progress Notes (Signed)
Pt declined to stay for post infusion observation period. Pt stated he has tolerated medication multiple times prior without difficulty. Pt aware to call clinic with any questions or concerns. Pt verbalized understanding and had no further questions.   

## 2020-11-24 LAB — KAPPA/LAMBDA LIGHT CHAINS
Kappa free light chain: 22.8 mg/L — ABNORMAL HIGH (ref 3.3–19.4)
Kappa, lambda light chain ratio: 13.41 — ABNORMAL HIGH (ref 0.26–1.65)
Lambda free light chains: 1.7 mg/L — ABNORMAL LOW (ref 5.7–26.3)

## 2020-11-24 LAB — IGG, IGA, IGM
IgA: 10 mg/dL — ABNORMAL LOW (ref 61–437)
IgG (Immunoglobin G), Serum: 631 mg/dL (ref 603–1613)
IgM (Immunoglobulin M), Srm: <5 mg/dL — ABNORMAL LOW (ref 15–143)

## 2020-11-24 LAB — PROTEIN ELECTROPHORESIS, SERUM
A/G Ratio: 1.6 (ref 0.7–1.7)
Albumin ELP: 3.6 g/dL (ref 2.9–4.4)
Alpha-1-Globulin: 0.2 g/dL (ref 0.0–0.4)
Alpha-2-Globulin: 0.7 g/dL (ref 0.4–1.0)
Beta Globulin: 0.8 g/dL (ref 0.7–1.3)
Gamma Globulin: 0.6 g/dL (ref 0.4–1.8)
Globulin, Total: 2.2 g/dL (ref 2.2–3.9)
Total Protein ELP: 5.8 g/dL — ABNORMAL LOW (ref 6.0–8.5)

## 2020-11-24 MED ORDER — FULVESTRANT 250 MG/5ML IM SOLN
INTRAMUSCULAR | Status: AC
  Filled 2020-11-24: qty 10

## 2020-11-29 ENCOUNTER — Telehealth: Payer: Self-pay

## 2020-11-29 NOTE — Telephone Encounter (Signed)
-----   Message from [name], MD sent at 11/29/2020 11:44 AM EDT ----- Please call and tell him that the light chains are very stable.  I am very happy about this.  They are stable and low.  Tell him that I hope his blueberry bushes are producing a lot of blueberries for him.  Laurey Arrow

## 2020-12-21 ENCOUNTER — Other Ambulatory Visit: Payer: Medicare Other

## 2020-12-21 ENCOUNTER — Ambulatory Visit: Payer: Medicare Other

## 2020-12-21 ENCOUNTER — Ambulatory Visit: Payer: Medicare Other | Admitting: Hematology & Oncology

## 2020-12-25 ENCOUNTER — Other Ambulatory Visit: Payer: Self-pay

## 2020-12-25 DIAGNOSIS — C9000 Multiple myeloma not having achieved remission: Secondary | ICD-10-CM

## 2020-12-26 ENCOUNTER — Other Ambulatory Visit: Payer: Self-pay

## 2020-12-26 ENCOUNTER — Inpatient Hospital Stay: Payer: Medicare Other

## 2020-12-26 ENCOUNTER — Inpatient Hospital Stay: Payer: Medicare Other | Attending: Hematology & Oncology

## 2020-12-26 ENCOUNTER — Telehealth: Payer: Self-pay

## 2020-12-26 ENCOUNTER — Other Ambulatory Visit: Payer: Self-pay | Admitting: Family

## 2020-12-26 ENCOUNTER — Encounter: Payer: Self-pay | Admitting: Hematology & Oncology

## 2020-12-26 ENCOUNTER — Inpatient Hospital Stay (HOSPITAL_BASED_OUTPATIENT_CLINIC_OR_DEPARTMENT_OTHER): Payer: Medicare Other | Admitting: Hematology & Oncology

## 2020-12-26 VITALS — BP 123/62 | HR 98 | Temp 98.0°F | Resp 18 | Wt 208.0 lb

## 2020-12-26 DIAGNOSIS — C9002 Multiple myeloma in relapse: Secondary | ICD-10-CM

## 2020-12-26 DIAGNOSIS — E109 Type 1 diabetes mellitus without complications: Secondary | ICD-10-CM | POA: Insufficient documentation

## 2020-12-26 DIAGNOSIS — C9000 Multiple myeloma not having achieved remission: Secondary | ICD-10-CM

## 2020-12-26 DIAGNOSIS — R5383 Other fatigue: Secondary | ICD-10-CM | POA: Insufficient documentation

## 2020-12-26 DIAGNOSIS — D801 Nonfamilial hypogammaglobulinemia: Secondary | ICD-10-CM | POA: Insufficient documentation

## 2020-12-26 DIAGNOSIS — Z9221 Personal history of antineoplastic chemotherapy: Secondary | ICD-10-CM | POA: Insufficient documentation

## 2020-12-26 DIAGNOSIS — Z298 Encounter for other specified prophylactic measures: Secondary | ICD-10-CM | POA: Insufficient documentation

## 2020-12-26 DIAGNOSIS — Z5112 Encounter for antineoplastic immunotherapy: Secondary | ICD-10-CM | POA: Insufficient documentation

## 2020-12-26 LAB — CBC WITH DIFFERENTIAL (CANCER CENTER ONLY)
Abs Immature Granulocytes: 0.02 10*3/uL (ref 0.00–0.07)
Basophils Absolute: 0.1 10*3/uL (ref 0.0–0.1)
Basophils Relative: 1 %
Eosinophils Absolute: 0.1 10*3/uL (ref 0.0–0.5)
Eosinophils Relative: 1 %
HCT: 49.7 % (ref 39.0–52.0)
Hemoglobin: 16.9 g/dL (ref 13.0–17.0)
Immature Granulocytes: 0 %
Lymphocytes Relative: 29 %
Lymphs Abs: 1.8 10*3/uL (ref 0.7–4.0)
MCH: 33.1 pg (ref 26.0–34.0)
MCHC: 34 g/dL (ref 30.0–36.0)
MCV: 97.5 fL (ref 80.0–100.0)
Monocytes Absolute: 0.9 10*3/uL (ref 0.1–1.0)
Monocytes Relative: 15 %
Neutro Abs: 3.3 10*3/uL (ref 1.7–7.7)
Neutrophils Relative %: 54 %
Platelet Count: 147 10*3/uL — ABNORMAL LOW (ref 150–400)
RBC: 5.1 MIL/uL (ref 4.22–5.81)
RDW: 13.7 % (ref 11.5–15.5)
WBC Count: 6.1 10*3/uL (ref 4.0–10.5)
nRBC: 0 % (ref 0.0–0.2)

## 2020-12-26 LAB — CMP (CANCER CENTER ONLY)
ALT: 32 U/L (ref 0–44)
AST: 28 U/L (ref 15–41)
Albumin: 3.8 g/dL (ref 3.5–5.0)
Alkaline Phosphatase: 71 U/L (ref 38–126)
Anion gap: 8 (ref 5–15)
BUN: 23 mg/dL (ref 8–23)
CO2: 26 mmol/L (ref 22–32)
Calcium: 9.3 mg/dL (ref 8.9–10.3)
Chloride: 103 mmol/L (ref 98–111)
Creatinine: 1.07 mg/dL (ref 0.61–1.24)
GFR, Estimated: >60 mL/min (ref >60)
Glucose, Bld: 246 mg/dL — ABNORMAL HIGH (ref 70–99)
Potassium: 4.4 mmol/L (ref 3.5–5.1)
Sodium: 137 mmol/L (ref 135–145)
Total Bilirubin: 1.1 mg/dL (ref 0.3–1.2)
Total Protein: 5.6 g/dL — ABNORMAL LOW (ref 6.5–8.1)

## 2020-12-26 LAB — LACTATE DEHYDROGENASE: LDH: 140 U/L (ref 98–192)

## 2020-12-26 MED ORDER — DIPHENHYDRAMINE HCL 25 MG PO CAPS
ORAL_CAPSULE | ORAL | Status: AC
  Filled 2020-12-26: qty 2

## 2020-12-26 MED ORDER — ALBUTEROL SULFATE HFA 108 (90 BASE) MCG/ACT IN AERS: 2.0000 | INHALATION_SPRAY | Freq: Once | RESPIRATORY_TRACT | Status: DC | PRN

## 2020-12-26 MED ORDER — EPINEPHRINE 0.3 MG/0.3ML IJ SOAJ: 0.3000 mg | Freq: Once | INTRAMUSCULAR | Status: DC | PRN

## 2020-12-26 MED ORDER — DEXTROSE 5 % IV SOLN
INTRAVENOUS | Status: DC
  Filled 2020-12-26: qty 250

## 2020-12-26 MED ORDER — HEPARIN SOD (PORK) LOCK FLUSH 100 UNIT/ML IV SOLN
500.0000 [IU] | Freq: Once | INTRAVENOUS | Status: AC
  Administered 2020-12-26: 500 [IU] via INTRAVENOUS
  Filled 2020-12-26: qty 5

## 2020-12-26 MED ORDER — TIXAGEVIMAB (PART OF EVUSHELD) INJECTION
300.0000 mg | Freq: Once | INTRAMUSCULAR | Status: DC
  Administered 2020-12-26: 300 mg via INTRAMUSCULAR

## 2020-12-26 MED ORDER — DEXAMETHASONE 4 MG PO TABS
ORAL_TABLET | ORAL | Status: AC
  Filled 2020-12-26: qty 2

## 2020-12-26 MED ORDER — DIPHENHYDRAMINE HCL 25 MG PO CAPS
50.0000 mg | ORAL_CAPSULE | Freq: Once | ORAL | Status: AC
  Administered 2020-12-26: 50 mg via ORAL

## 2020-12-26 MED ORDER — ACETAMINOPHEN 325 MG PO TABS
650.0000 mg | ORAL_TABLET | Freq: Once | ORAL | Status: AC
  Administered 2020-12-26: 650 mg via ORAL

## 2020-12-26 MED ORDER — CILGAVIMAB (PART OF EVUSHELD) INJECTION
300.0000 mg | Freq: Once | INTRAMUSCULAR | Status: DC
  Administered 2020-12-26: 300 mg via INTRAMUSCULAR

## 2020-12-26 MED ORDER — DIPHENHYDRAMINE HCL 50 MG/ML IJ SOLN: 50.0000 mg | Freq: Once | INTRAMUSCULAR | Status: DC | PRN

## 2020-12-26 MED ORDER — ACETAMINOPHEN 325 MG PO TABS
ORAL_TABLET | ORAL | Status: AC
  Filled 2020-12-26: qty 2

## 2020-12-26 MED ORDER — DEXAMETHASONE 4 MG PO TABS
8.0000 mg | ORAL_TABLET | Freq: Once | ORAL | Status: AC
  Administered 2020-12-26: 8 mg via ORAL

## 2020-12-26 MED ORDER — METHYLPREDNISOLONE SODIUM SUCC 125 MG IJ SOLR: 125.0000 mg | Freq: Once | INTRAMUSCULAR | Status: DC | PRN

## 2020-12-26 MED ORDER — SODIUM CHLORIDE 0.9% FLUSH
10.0000 mL | Freq: Once | INTRAVENOUS | Status: AC
  Administered 2020-12-26: 10 mL via INTRAVENOUS
  Filled 2020-12-26: qty 10

## 2020-12-26 MED ORDER — TIXAGEVIMAB (PART OF EVUSHELD) INJECTION
300.0000 mg | Freq: Once | INTRAMUSCULAR | Status: AC
  Filled 2020-12-26: qty 3

## 2020-12-26 MED ORDER — DARATUMUMAB-HYALURONIDASE-FIHJ 1800-30000 MG-UT/15ML ~~LOC~~ SOLN
1800.0000 mg | Freq: Once | SUBCUTANEOUS | Status: AC
  Administered 2020-12-26: 1800 mg via SUBCUTANEOUS
  Filled 2020-12-26: qty 15

## 2020-12-26 MED ORDER — CILGAVIMAB (PART OF EVUSHELD) INJECTION
300.0000 mg | Freq: Once | INTRAMUSCULAR | Status: AC
  Filled 2020-12-26: qty 3

## 2020-12-26 MED ORDER — IMMUNE GLOBULIN (HUMAN) 20 GM/200ML IV SOLN
40.0000 g | Freq: Once | INTRAVENOUS | Status: AC
Start: 2020-12-26 — End: 2020-12-26
  Administered 2020-12-26: 40 g via INTRAVENOUS
  Filled 2020-12-26: qty 400

## 2020-12-26 NOTE — Progress Notes (Signed)
Hematology and Oncology Follow Up Visit  Rickey [name] [id number] 1940/08/07 80 y.o. 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 Elotuzumab/Pomalidomide/decadron -every 4 weeks- start 01/12/2018 Kyprolis/Pomalidomide/Decadron -- s/p cycle #9 -- d/c pomaldomide in 01/2019 Xpovio 80 mg po q week -- start on 12/26/2019 - d/c on 04/2020 Daratumumab -started cycle 1 in January 2022   Interim History:  Rickey [name] is back for follow-up.  He is doing quite nicely.  [place]  He is enjoying the cooler weather up there.  When we last saw him, the Kappa light chain was 2.3 mg/dL.  His blood sugars have been doing pretty well.  He is trying to monitor them very carefully.  He has had no problems with infection.  He was exposed to [place] but did not have COVID itself.  He has had no problems with cough or shortness of breath.  There is no chest wall pain.  He has had no change in bowel or bladder habits.  There is been no diarrhea.  He has had no leg swelling.  There is no issues with neuropathy.  Overall, his performance status is ECOG 1.     Medications:  Allergies as of 12/26/2020       Reactions   Ace Inhibitors    Other reaction(s): cough        Medication List        Accurate as of December 26, 2020 11:28 AM. If you have any questions, ask your nurse or doctor.          STOP taking these medications    calcium citrate-vitamin D 315-200 MG-UNIT tablet Commonly known as: CITRACAL+D Stopped by: [name], MD   calcium-vitamin D 250-125 MG-UNIT tablet Commonly known as: OSCAL Stopped by: [name], MD   diphenhydramine-acetaminophen 25-500 MG Tabs tablet Commonly known as:  TYLENOL PM Stopped by: [name], MD   melatonin 3 MG Tabs tablet Stopped by: [name], MD       TAKE these medications    acyclovir 400 MG tablet Commonly known as: ZOVIRAX 2 (two) times daily. What changed: Another medication with the same name was removed. Continue taking this medication, and follow the directions you see here. Changed by: [name], MD   aspirin 81 MG EC tablet daily. What changed: Another medication with the same name was removed. Continue taking this medication, and follow the directions you see here. Changed by: [name], MD   atorvastatin 10 MG tablet Commonly known as: LIPITOR Take 1 tablet by mouth daily. What changed: Another medication with the same name was removed. Continue taking this medication, and follow the directions you see here. Changed by: [name], MD   B-D INS SYR ULTRAFINE .5CC/30G 30G X 1/2" 0.5 ML Misc Generic drug: Insulin Syringe-Needle U-100 Use as directed with insulin 4-5 times a day   CALCIUM CARBONATE PO Take 220 mg by mouth 2 (two) times daily.   Cartridge Pump Misc Inject into the skin.   cycloSPORINE 0.05 % ophthalmic emulsion Commonly known as: RESTASIS Place 1 drop into both eyes 2 (two) times daily.   Darzalex 400 MG/20ML Generic drug: daratumumab See admin instructions.  fish oil-omega-3 fatty acids 1000 MG capsule Take 1 g by mouth 2 (two) times daily.   freestyle lancets Test blood sugars 3 times a day as directed.  E10.9   FREESTYLE LITE test strip Generic drug: glucose blood Test CBG's 3 times a day or as directed  E10.9   INSULIN LISP & LISP PROT (HUM) [place]   insulin lispro 100 UNIT/ML injection Commonly known as: HUMALOG Inject into the skin. Insulin pump 12/21/2018 Typically 80U daily.   irbesartan-hydrochlorothiazide 300-12.5 MG tablet Commonly known as: AVALIDE Take 1 tablet by mouth daily.   lidocaine-prilocaine cream Commonly known as: EMLA Apply 1  application topically as needed.   montelukast 10 MG tablet Commonly known as: SINGULAIR Take 1 tablet by mouth daily. What changed: Another medication with the same name was removed. Continue taking this medication, and follow the directions you see here. Changed by: [name], MD   Vitamin D 50 MCG (2000 UT) Caps Take by mouth every morning.   Vitamin K2-Vitamin D3 45-2000 MCG-UNIT Caps daily.   zolpidem 5 MG tablet Commonly known as: AMBIEN Take 5 mg by mouth at bedtime.        Allergies:  Allergies  Allergen Reactions   Ace Inhibitors     Other reaction(s): cough    Past Medical History, Surgical history, Social history, and Family History were reviewed and updated.  Review of Systems: Review of Systems  Constitutional: Negative.   HENT: Negative.    Eyes: Negative.   Respiratory: Negative.    Cardiovascular: Negative.   Gastrointestinal: Negative.   Genitourinary: Negative.   Musculoskeletal: Negative.   Skin: Negative.   Neurological: Negative.   Endo/Heme/Allergies: Negative.   Psychiatric/Behavioral: Negative.      Physical Exam:  weight is 208 lb (94.3 kg). His oral temperature is 98 F (36.7 C). His blood pressure is 123/62 and his pulse is 98. His respiration is 18 and oxygen saturation is 98%.   Wt Readings from Last 3 Encounters:  12/26/20 208 lb (94.3 kg)  11/07/20 210 lb 6.4 oz (95.4 kg)  05/15/20 208 lb (94.3 kg)    Physical Exam Vitals reviewed.  HENT:     Head: Normocephalic and atraumatic.  Eyes:     Pupils: Pupils are equal, round, and reactive to light.  Cardiovascular:     Rate and Rhythm: Normal rate and regular rhythm.     Heart sounds: Normal heart sounds.  Pulmonary:     Effort: Pulmonary effort is normal.     Breath sounds: Normal breath sounds.  Abdominal:     General: Bowel sounds are normal.     Palpations: Abdomen is soft.  Musculoskeletal:        General: No tenderness or deformity. Normal range of motion.      Cervical back: Normal range of motion.  Lymphadenopathy:     Cervical: No cervical adenopathy.  Skin:    General: Skin is warm and dry.     Findings: No erythema or rash.  Neurological:     Mental Status: He is alert and oriented to person, place, and time.  Psychiatric:        Behavior: Behavior normal.        Thought Content: Thought content normal.        Judgment: Judgment normal.   Lab Results  Component Value Date   WBC 6.1 12/26/2020   HGB 16.9 12/26/2020   HCT 49.7 12/26/2020   MCV 97.5 12/26/2020   PLT 147 (L)  12/26/2020   Lab Results  Component Value Date   FERRITIN 258 12/28/2018   IRON 98 12/28/2018   TIBC 253 12/28/2018   UIBC 154 12/28/2018   IRONPCTSAT 39 12/28/2018   Lab Results  Component Value Date   RBC 5.10 12/26/2020   Lab Results  Component Value Date   KPAFRELGTCHN 22.8 (H) 11/23/2020   LAMBDASER 1.7 (L) 11/23/2020   KAPLAMBRATIO 13.41 (H) 11/23/2020   Lab Results  Component Value Date   IGGSERUM 631 11/23/2020   IGA 10 (L) 11/23/2020   IGMSERUM <5 (L) 11/23/2020   Lab Results  Component Value Date   TOTALPROTELP 5.8 (L) 11/23/2020   ALBUMINELP 3.6 11/23/2020   A1GS 0.2 11/23/2020   A2GS 0.7 11/23/2020   BETS 0.8 11/23/2020   BETA2SER 0.3 04/19/2015   GAMS 0.6 11/23/2020   MSPIKE Not Observed 11/23/2020   SPEI Comment 11/23/2020     Chemistry      Component Value Date/Time   NA 137 12/26/2020 0955   NA 142 04/23/2017 1431   NA 139 07/18/2016 1135   K 4.4 12/26/2020 0955   K 4.0 04/23/2017 1431   K 4.4 07/18/2016 1135   CL 103 12/26/2020 0955   CL 105 04/23/2017 1431   CO2 26 12/26/2020 0955   CO2 28 04/23/2017 1431   CO2 27 07/18/2016 1135   BUN 23 12/26/2020 0955   BUN 20 04/23/2017 1431   BUN 12.2 07/18/2016 1135   CREATININE 1.07 12/26/2020 0955   CREATININE 1.2 04/23/2017 1431   CREATININE 0.9 07/18/2016 1135      Component Value Date/Time   CALCIUM 9.3 12/26/2020 0955   CALCIUM 9.3 04/23/2017 1431    CALCIUM 8.9 07/18/2016 1135   ALKPHOS 71 12/26/2020 0955   ALKPHOS 64 04/23/2017 1431   ALKPHOS 58 07/18/2016 1135   AST 28 12/26/2020 0955   AST 15 07/18/2016 1135   ALT 32 12/26/2020 0955   ALT 22 04/23/2017 1431   ALT 20 07/18/2016 1135   BILITOT 1.1 12/26/2020 0955   BILITOT 1.24 (H) 07/18/2016 1135      Impression and Plan: Rickey [name] is a very pleasant 80 yo caucasian gentleman with 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   We will continue him on the daratumumab.  This apparently is working quite nicely.  I am very happy for him.  We will see what his Kappa light chain level is.  He will get the IVIG today.  He also would like to have Evushield.I do think that this probably is going to help with respect to any kind of COVID exposure.  He will now go to monthly daratumumab.  We will plan to get him back in 1 month.    [name], MD 8/2/202211:28 AM

## 2020-12-26 NOTE — Telephone Encounter (Signed)
Appts made per 12/26/20 los, pt to gain sch at [employer] and through [employer]

## 2020-12-27 ENCOUNTER — Telehealth: Payer: Self-pay | Admitting: *Deleted

## 2020-12-27 LAB — KAPPA/LAMBDA LIGHT CHAINS
Kappa free light chain: 24.1 mg/L — ABNORMAL HIGH (ref 3.3–19.4)
Kappa, lambda light chain ratio: >16.07 — ABNORMAL HIGH (ref 0.26–1.65)
Lambda free light chains: <1.5 mg/L — ABNORMAL LOW (ref 5.7–26.3)

## 2020-12-27 LAB — IGG, IGA, IGM
IgA: 8 mg/dL — ABNORMAL LOW (ref 61–437)
IgG (Immunoglobin G), Serum: 413 mg/dL — ABNORMAL LOW (ref 603–1613)
IgM (Immunoglobulin M), Srm: <5 mg/dL — ABNORMAL LOW (ref 15–143)

## 2020-12-27 NOTE — Telephone Encounter (Signed)
Notified pt of results. No concerns at this time. 

## 2020-12-27 NOTE — Telephone Encounter (Signed)
-----   Message from [name], MD sent at 12/27/2020  2:58 PM EDT ----- Please call and let her know that the light chains are holding steady at 24.  Rickey [name]

## 2020-12-29 LAB — IMMUNOFIXATION REFLEX, SERUM
IgA: 7 mg/dL — ABNORMAL LOW (ref 61–437)
IgG (Immunoglobin G), Serum: 446 mg/dL — ABNORMAL LOW (ref 603–1613)
IgM (Immunoglobulin M), Srm: <5 mg/dL — ABNORMAL LOW (ref 15–143)

## 2020-12-29 LAB — PROTEIN ELECTROPHORESIS, SERUM, WITH REFLEX
A/G Ratio: 1.9 — ABNORMAL HIGH (ref 0.7–1.7)
Albumin ELP: 3.6 g/dL (ref 2.9–4.4)
Alpha-1-Globulin: 0.1 g/dL (ref 0.0–0.4)
Alpha-2-Globulin: 0.7 g/dL (ref 0.4–1.0)
Beta Globulin: 0.6 g/dL — ABNORMAL LOW (ref 0.7–1.3)
Gamma Globulin: 0.3 g/dL — ABNORMAL LOW (ref 0.4–1.8)
Globulin, Total: 1.9 g/dL — ABNORMAL LOW (ref 2.2–3.9)
M-Spike, %: 0.1 g/dL — ABNORMAL HIGH
SPEP Interpretation: 0
Total Protein ELP: 5.5 g/dL — ABNORMAL LOW (ref 6.0–8.5)

## 2021-01-23 ENCOUNTER — Inpatient Hospital Stay: Payer: Medicare Other

## 2021-01-23 ENCOUNTER — Encounter: Payer: Self-pay | Admitting: Family

## 2021-01-23 ENCOUNTER — Other Ambulatory Visit: Payer: Self-pay

## 2021-01-23 ENCOUNTER — Other Ambulatory Visit: Payer: Medicare Other

## 2021-01-23 ENCOUNTER — Ambulatory Visit: Payer: Medicare Other

## 2021-01-23 ENCOUNTER — Ambulatory Visit: Payer: Medicare Other | Admitting: Family

## 2021-01-23 ENCOUNTER — Inpatient Hospital Stay (HOSPITAL_BASED_OUTPATIENT_CLINIC_OR_DEPARTMENT_OTHER): Payer: Medicare Other | Admitting: Family

## 2021-01-23 VITALS — BP 130/74 | HR 71 | Temp 97.6°F | Resp 18 | Ht 72.0 in | Wt 209.8 lb

## 2021-01-23 DIAGNOSIS — C9000 Multiple myeloma not having achieved remission: Secondary | ICD-10-CM

## 2021-01-23 DIAGNOSIS — Z0000 Encounter for general adult medical examination without abnormal findings: Secondary | ICD-10-CM

## 2021-01-23 DIAGNOSIS — E1021 Type 1 diabetes mellitus with diabetic nephropathy: Secondary | ICD-10-CM | POA: Diagnosis not present

## 2021-01-23 DIAGNOSIS — N1832 Chronic kidney disease, stage 3b: Secondary | ICD-10-CM

## 2021-01-23 DIAGNOSIS — D801 Nonfamilial hypogammaglobulinemia: Secondary | ICD-10-CM | POA: Diagnosis not present

## 2021-01-23 DIAGNOSIS — E1022 Type 1 diabetes mellitus with diabetic chronic kidney disease: Secondary | ICD-10-CM

## 2021-01-23 DIAGNOSIS — Z5112 Encounter for antineoplastic immunotherapy: Secondary | ICD-10-CM | POA: Diagnosis not present

## 2021-01-23 DIAGNOSIS — Z95828 Presence of other vascular implants and grafts: Secondary | ICD-10-CM

## 2021-01-23 LAB — CBC WITH DIFFERENTIAL (CANCER CENTER ONLY)
Abs Immature Granulocytes: 0.02 10*3/uL (ref 0.00–0.07)
Basophils Absolute: 0 10*3/uL (ref 0.0–0.1)
Basophils Relative: 1 %
Eosinophils Absolute: 0.1 10*3/uL (ref 0.0–0.5)
Eosinophils Relative: 1 %
HCT: 50.8 % (ref 39.0–52.0)
Hemoglobin: 17.4 g/dL — ABNORMAL HIGH (ref 13.0–17.0)
Immature Granulocytes: 0 %
Lymphocytes Relative: 38 %
Lymphs Abs: 2.2 10*3/uL (ref 0.7–4.0)
MCH: 33.3 pg (ref 26.0–34.0)
MCHC: 34.3 g/dL (ref 30.0–36.0)
MCV: 97.1 fL (ref 80.0–100.0)
Monocytes Absolute: 1 10*3/uL (ref 0.1–1.0)
Monocytes Relative: 16 %
Neutro Abs: 2.7 10*3/uL (ref 1.7–7.7)
Neutrophils Relative %: 44 %
Platelet Count: 147 10*3/uL — ABNORMAL LOW (ref 150–400)
RBC: 5.23 MIL/uL (ref 4.22–5.81)
RDW: 13.4 % (ref 11.5–15.5)
WBC Count: 6 10*3/uL (ref 4.0–10.5)
nRBC: 0 % (ref 0.0–0.2)

## 2021-01-23 LAB — CMP (CANCER CENTER ONLY)
ALT: 26 U/L (ref 0–44)
AST: 30 U/L (ref 15–41)
Albumin: 3.9 g/dL (ref 3.5–5.0)
Alkaline Phosphatase: 60 U/L (ref 38–126)
Anion gap: 7 (ref 5–15)
BUN: 27 mg/dL — ABNORMAL HIGH (ref 8–23)
CO2: 27 mmol/L (ref 22–32)
Calcium: 9.6 mg/dL (ref 8.9–10.3)
Chloride: 105 mmol/L (ref 98–111)
Creatinine: 1 mg/dL (ref 0.61–1.24)
GFR, Estimated: >60 mL/min (ref >60)
Glucose, Bld: 118 mg/dL — ABNORMAL HIGH (ref 70–99)
Potassium: 3.8 mmol/L (ref 3.5–5.1)
Sodium: 139 mmol/L (ref 135–145)
Total Bilirubin: 1.1 mg/dL (ref 0.3–1.2)
Total Protein: 6.2 g/dL — ABNORMAL LOW (ref 6.5–8.1)

## 2021-01-23 LAB — LACTATE DEHYDROGENASE: LDH: 174 U/L (ref 98–192)

## 2021-01-23 MED ORDER — DEXAMETHASONE 4 MG PO TABS
8.0000 mg | ORAL_TABLET | Freq: Once | ORAL | Status: AC
  Administered 2021-01-23: 8 mg via ORAL
  Filled 2021-01-23: qty 2

## 2021-01-23 MED ORDER — HEPARIN SOD (PORK) LOCK FLUSH 100 UNIT/ML IV SOLN
500.0000 [IU] | Freq: Once | INTRAVENOUS | Status: AC
  Administered 2021-01-23: 500 [IU] via INTRAVENOUS

## 2021-01-23 MED ORDER — ACETAMINOPHEN 325 MG PO TABS
650.0000 mg | ORAL_TABLET | Freq: Once | ORAL | Status: AC
  Administered 2021-01-23: 650 mg via ORAL
  Filled 2021-01-23: qty 2

## 2021-01-23 MED ORDER — DARATUMUMAB-HYALURONIDASE-FIHJ 1800-30000 MG-UT/15ML ~~LOC~~ SOLN
1800.0000 mg | Freq: Once | SUBCUTANEOUS | Status: AC
  Administered 2021-01-23: 1800 mg via SUBCUTANEOUS
  Filled 2021-01-23: qty 15

## 2021-01-23 MED ORDER — SODIUM CHLORIDE 0.9% FLUSH
10.0000 mL | INTRAVENOUS | Status: DC | PRN
  Administered 2021-01-23: 10 mL via INTRAVENOUS

## 2021-01-23 MED ORDER — DIPHENHYDRAMINE HCL 25 MG PO CAPS
50.0000 mg | ORAL_CAPSULE | Freq: Once | ORAL | Status: AC
  Administered 2021-01-23: 50 mg via ORAL
  Filled 2021-01-23: qty 2

## 2021-01-23 NOTE — Progress Notes (Signed)
Hematology and Oncology Follow Up Visit  Rickey [name] [id number] Dec 28, 1940 80 y.o. 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 Elotuzumab/Pomalidomide/decadron -every 4 weeks- start 01/12/2018 Kyprolis/Pomalidomide/Decadron -- s/p cycle #9 -- d/c pomaldomide in 01/2019 Xpovio 80 mg po q week -- start on 12/26/2019 - d/c on 04/2020   Current Therapy:        Daratumumab -started cycle 1 in January 2022   Interim History:  Rickey [name] is here today for follow-up and treatment. He is doing well and has been enjoying working out in his flower garden. [employer] was recently at his home and took pictures for their April 2023 issue.  Earlier this month M-spike was 0.1 g/dL, IgG level 413 mg/dL and kappa light chains 2.41 mg/dL. No fever, chills, n/v, cough, rash, dizziness, SOB, chest pain, palpitations, abdominal pain or changes in bowel or bladder habits.  No swelling, numbness or tingling in his extremities.  No falls or syncope to report.  He states that his blood sugars are well maintained.  He has a good appetite and is staying well hydrated. His weight is stable at 209 lbs.   ECOG Performance Status: 1 - Symptomatic but completely ambulatory  Medications:  Allergies as of 01/23/2021       Reactions   Ace Inhibitors    Other reaction(s): cough        Medication List        Accurate as of January 23, 2021 11:21 AM. If you have any questions, ask your nurse or doctor.          acyclovir 400 MG tablet Commonly known as: ZOVIRAX 2 (two) times daily.   aspirin 81 MG EC tablet daily.   atorvastatin 10 MG tablet Commonly known as: LIPITOR Take 1 tablet by mouth daily.   B-D INS SYR ULTRAFINE .5CC/30G 30G X  1/2" 0.5 ML Misc Generic drug: Insulin Syringe-Needle U-100 Use as directed with insulin 4-5 times a day   CALCIUM CARBONATE PO Take 220 mg by mouth 2 (two) times daily.   Cartridge Pump Misc Inject into the skin.   cycloSPORINE 0.05 % ophthalmic emulsion Commonly known as: RESTASIS Place 1 drop into both eyes 2 (two) times daily.   Darzalex 400 MG/20ML Generic drug: daratumumab See admin instructions.   fish oil-omega-3 fatty acids 1000 MG capsule Take 1 g by mouth 2 (two) times daily.   freestyle lancets Test blood sugars 3 times a day as directed.  E10.9   FREESTYLE LITE test strip Generic drug: glucose blood Test CBG's 3 times a day or as directed  E10.9   INSULIN LISP & LISP PROT (HUM) [place]   insulin lispro 100 UNIT/ML injection Commonly known as: HUMALOG Inject into the skin. Insulin pump 12/21/2018 Typically 80U daily.   irbesartan-hydrochlorothiazide 300-12.5 MG tablet Commonly known as: AVALIDE Take 1 tablet by mouth daily.   lidocaine-prilocaine cream Commonly known as: EMLA Apply 1 application topically as needed.   montelukast 10 MG tablet Commonly known as: SINGULAIR Take 1 tablet by mouth daily.   Vitamin D 50 MCG (2000 UT) Caps Take by mouth every morning.   Vitamin K2-Vitamin D3 45-2000 MCG-UNIT Caps daily.   zolpidem 5 MG tablet Commonly known as: AMBIEN Take 5 mg  by mouth at bedtime.        Allergies:  Allergies  Allergen Reactions   Ace Inhibitors     Other reaction(s): cough    Past Medical History, Surgical history, Social history, and Family History were reviewed and updated.  Review of Systems: All other 10 point review of systems is negative.   Physical Exam:  vitals were not taken for this visit.   Wt Readings from Last 3 Encounters:  12/26/20 208 lb (94.3 kg)  11/07/20 210 lb 6.4 oz (95.4 kg)  05/15/20 208 lb (94.3 kg)    Ocular: Sclerae unicteric, pupils equal, round and reactive to light Ear-nose-throat:  Oropharynx clear, dentition fair Lymphatic: No cervical or supraclavicular adenopathy Lungs no rales or rhonchi, good excursion bilaterally Heart regular rate and rhythm, no murmur appreciated Abd soft, nontender, positive bowel sounds MSK no focal spinal tenderness, no joint edema Neuro: non-focal, well-oriented, appropriate affect Breasts: Deferred   Lab Results  Component Value Date   WBC 6.0 01/23/2021   HGB 17.4 (H) 01/23/2021   HCT 50.8 01/23/2021   MCV 97.1 01/23/2021   PLT 147 (L) 01/23/2021   Lab Results  Component Value Date   FERRITIN 258 12/28/2018   IRON 98 12/28/2018   TIBC 253 12/28/2018   UIBC 154 12/28/2018   IRONPCTSAT 39 12/28/2018   Lab Results  Component Value Date   RBC 5.23 01/23/2021   Lab Results  Component Value Date   KPAFRELGTCHN 24.1 (H) 12/26/2020   LAMBDASER <1.5 (L) 12/26/2020   KAPLAMBRATIO >16.07 (H) 12/26/2020   Lab Results  Component Value Date   IGGSERUM 446 (L) 12/26/2020   IGA 7 (L) 12/26/2020   IGMSERUM <5 (L) 12/26/2020   Lab Results  Component Value Date   TOTALPROTELP 5.5 (L) 12/26/2020   ALBUMINELP 3.6 12/26/2020   A1GS 0.1 12/26/2020   A2GS 0.7 12/26/2020   BETS 0.6 (L) 12/26/2020   BETA2SER 0.3 04/19/2015   GAMS 0.3 (L) 12/26/2020   MSPIKE 0.1 (H) 12/26/2020   SPEI Comment 11/23/2020     Chemistry      Component Value Date/Time   NA 137 12/26/2020 0955   NA 142 04/23/2017 1431   NA 139 07/18/2016 1135   K 4.4 12/26/2020 0955   K 4.0 04/23/2017 1431   K 4.4 07/18/2016 1135   CL 103 12/26/2020 0955   CL 105 04/23/2017 1431   CO2 26 12/26/2020 0955   CO2 28 04/23/2017 1431   CO2 27 07/18/2016 1135   BUN 23 12/26/2020 0955   BUN 20 04/23/2017 1431   BUN 12.2 07/18/2016 1135   CREATININE 1.07 12/26/2020 0955   CREATININE 1.2 04/23/2017 1431   CREATININE 0.9 07/18/2016 1135      Component Value Date/Time   CALCIUM 9.3 12/26/2020 0955   CALCIUM 9.3 04/23/2017 1431   CALCIUM 8.9 07/18/2016 1135    ALKPHOS 71 12/26/2020 0955   ALKPHOS 64 04/23/2017 1431   ALKPHOS 58 07/18/2016 1135   AST 28 12/26/2020 0955   AST 15 07/18/2016 1135   ALT 32 12/26/2020 0955   ALT 22 04/23/2017 1431   ALT 20 07/18/2016 1135   BILITOT 1.1 12/26/2020 0955   BILITOT 1.24 (H) 07/18/2016 1135       Impression and Plan: Rickey [name] is a very pleasant 80 yo caucasian gentleman with 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  He continues to tolerate Daratumumab and will proceed with treatment today as planned.  Labs  drawn for PCP and will be CC'd on results (Hgb A1c, lipid panel and PSA).  Follow-up in 1 month.  He can contact our office with anyq uestions or concerns.   Laverna Peace, NP 8/30/202211:21 AM

## 2021-01-23 NOTE — Patient Instructions (Signed)
[facility]  Discharge Instructions: Thank you for choosing [facility] to provide your oncology and hematology care.   If you have a lab appointment with the [facility], please go directly to the [facility] and check in at the registration area.  Wear comfortable clothing and clothing appropriate for easy access to any Portacath or PICC line.   We strive to give you quality time with your provider. You may need to reschedule your appointment if you arrive late (15 or more minutes).  Arriving late affects you and other patients whose appointments are after yours.  Also, if you miss three or more appointments without notifying the office, you may be dismissed from the clinic at the provider's discretion.      For prescription refill requests, have your pharmacy contact our office and allow 72 hours for refills to be completed.    Today you received the following chemotherapy and/or immunotherapy agents darzelex       To help prevent nausea and vomiting after your treatment, we encourage you to take your nausea medication as directed.  BELOW ARE SYMPTOMS THAT SHOULD BE REPORTED IMMEDIATELY: *FEVER GREATER THAN 100.4 F (38 C) OR HIGHER *CHILLS OR SWEATING *NAUSEA AND VOMITING THAT IS NOT CONTROLLED WITH YOUR NAUSEA MEDICATION *UNUSUAL SHORTNESS OF BREATH *UNUSUAL BRUISING OR BLEEDING *URINARY PROBLEMS (pain or burning when urinating, or frequent urination) *BOWEL PROBLEMS (unusual diarrhea, constipation, pain near the anus) TENDERNESS IN MOUTH AND THROAT WITH OR WITHOUT PRESENCE OF ULCERS (sore throat, sores in mouth, or a toothache) UNUSUAL RASH, SWELLING OR PAIN  UNUSUAL VAGINAL DISCHARGE OR ITCHING   Items with * indicate a potential emergency and should be followed up as soon as possible or go to the Emergency Department if any problems should occur.  Please show the CHEMOTHERAPY ALERT CARD or IMMUNOTHERAPY ALERT CARD at check-in  to the Emergency Department and triage nurse. Should you have questions after your visit or need to cancel or reschedule your appointment, please contact [facility]  [phone] and follow the prompts.  Office hours are 8:00 a.m. to 4:30 p.m. Monday - Friday. Please note that voicemails left after 4:00 p.m. may not be returned until the following business day.  We are closed weekends and major holidays. You have access to a nurse at all times for urgent questions. Please call the main number to the clinic [phone] and follow the prompts.  For any non-urgent questions, you may also contact your provider using MyChart. We now offer e-Visits for anyone 39 and older to request care online for non-urgent symptoms. For details visit [email or web address].   Also download the MyChart app! Go to the app store, search "MyChart", open the app, select [facility], and log in with your MyChart username and password.  Due to Covid, a mask is required upon entering the hospital/clinic. If you do not have a mask, one will be given to you upon arrival. For doctor visits, patients may have 1 support person aged 28 or older with them. For treatment visits, patients cannot have anyone with them due to current Covid guidelines and our immunocompromised population. Daratumumab injection What is this medication? DARATUMUMAB (dar a toom ue mab) is a monoclonal antibody. It is used to treatmultiple myeloma. This medicine may be used for other purposes; ask your health care provider orpharmacist if you have questions. COMMON BRAND NAME(S): DARZALEX What should I tell my care team  before I take this medication? They need to know if you have any of these conditions: hereditary fructose intolerance infection (especially a virus infection such as chickenpox, herpes, or hepatitis B virus) lung or breathing disease (asthma, COPD) an unusual or allergic reaction to daratumumab, sorbitol, other  medicines, foods, dyes, or preservatives pregnant or trying to get pregnant breast-feeding How should I use this medication? This medicine is for infusion into a vein. It is given by a health careprofessional in a hospital or clinic setting. Talk to your pediatrician regarding the use of this medicine in children.Special care may be needed. Overdosage: If you think you have taken too much of this medicine contact apoison control center or emergency room at once. NOTE: This medicine is only for you. Do not share this medicine with others. What if I miss a dose? Keep appointments for follow-up doses as directed. It is important not to miss your dose. Call your doctor or health care professional if you are unable tokeep an appointment. What may interact with this medication? Interactions have not been studied. This list may not describe all possible interactions. Give your health care provider a list of all the medicines, herbs, non-prescription drugs, or dietary supplements you use. Also tell them if you smoke, drink alcohol, or use illegaldrugs. Some items may interact with your medicine. What should I watch for while using this medication? Your condition will be monitored carefully while you are receiving thismedicine. This medicine can cause serious allergic reactions. To reduce your risk, your health care provider may give you other medicine to take before receiving thisone. Be sure to follow the directions from your health care provider. This medicine can affect the results of blood tests to match your blood type. These changes can last for up to 6 months after the final dose. Your healthcare provider will do blood tests to match your blood type before you start treatment. Tell all of your healthcare providers that you are being treatedwith this medicine before receiving a blood transfusion. This medicine can affect the results of some tests used to determine treatmentresponse; extra tests may be  needed to evaluate response. Do not become pregnant while taking this medicine or for 3 months after stopping it. Women should inform their health care provider if they wish to become pregnant or think they might be pregnant. There is a potential for serious side effects to an unborn child. Talk to your health care provider formore information. Do not breast-feed an infant while taking this medicine. What side effects may I notice from receiving this medication? Side effects that you should report to your doctor or health care professionalas soon as possible: allergic reactions (skin rash, itching, hives, swelling of the face, lips, or tongue) blurred vision infection (fever, chills, cough, sore throat, pain or difficulty passing urine) infusion reaction (dizziness, fast heartbeat, feeling faint or lightheaded, falls, headache, increase in blood pressure, nausea, vomiting, or wheezing or trouble breathing with loud or whistling sounds) unusual bleeding or bruising Side effects that usually do not require medical attention (report to yourdoctor or health care professional if they continue or are bothersome): constipation diarrhea pain, tingling, numbness in the hands or feet swelling of the ankles, feet, hands tiredness This list may not describe all possible side effects. Call your doctor for medical advice about side effects. You may report side effects to FDA at1-800-FDA-1088. Where should I keep my medication? This drug is given in a hospital or clinic and will not be stored at  home. NOTE: This sheet is a summary. It may not cover all possible information. If you have questions about this medicine, talk to your doctor, pharmacist, orhealth care provider.  2022 Elsevier/Gold Standard (2020-06-22 12:50:38)

## 2021-01-23 NOTE — Patient Instructions (Signed)

## 2021-01-24 LAB — LIPID PANEL
Cholesterol: 179 mg/dL (ref 0–200)
HDL: 80 mg/dL (ref >40)
LDL Cholesterol: 88 mg/dL (ref 0–99)
Total CHOL/HDL Ratio: 2.2 RATIO
Triglycerides: 55 mg/dL (ref <150)
VLDL: 11 mg/dL (ref 0–40)

## 2021-01-24 LAB — IGG, IGA, IGM
IgA: 7 mg/dL — ABNORMAL LOW (ref 61–437)
IgG (Immunoglobin G), Serum: 644 mg/dL (ref 603–1613)
IgM (Immunoglobulin M), Srm: <5 mg/dL — ABNORMAL LOW (ref 15–143)

## 2021-01-24 LAB — HEMOGLOBIN A1C
Hgb A1c MFr Bld: 7 % — ABNORMAL HIGH (ref 4.8–5.6)
Mean Plasma Glucose: 154 mg/dL

## 2021-01-24 LAB — PROSTATE-SPECIFIC AG, SERUM (LABCORP): Prostate Specific Ag, Serum: 6.2 ng/mL — ABNORMAL HIGH (ref 0.0–4.0)

## 2021-01-24 LAB — KAPPA/LAMBDA LIGHT CHAINS
Kappa free light chain: 36.4 mg/L — ABNORMAL HIGH (ref 3.3–19.4)
Kappa, lambda light chain ratio: 21.41 — ABNORMAL HIGH (ref 0.26–1.65)
Lambda free light chains: 1.7 mg/L — ABNORMAL LOW (ref 5.7–26.3)

## 2021-01-26 LAB — PROTEIN ELECTROPHORESIS, SERUM, WITH REFLEX
A/G Ratio: 1.5 (ref 0.7–1.7)
Albumin ELP: 3.4 g/dL (ref 2.9–4.4)
Alpha-1-Globulin: 0.2 g/dL (ref 0.0–0.4)
Alpha-2-Globulin: 0.7 g/dL (ref 0.4–1.0)
Beta Globulin: 0.8 g/dL (ref 0.7–1.3)
Gamma Globulin: 0.5 g/dL (ref 0.4–1.8)
Globulin, Total: 2.2 g/dL (ref 2.2–3.9)
Total Protein ELP: 5.6 g/dL — ABNORMAL LOW (ref 6.0–8.5)

## 2021-02-27 ENCOUNTER — Inpatient Hospital Stay: Payer: Medicare Other

## 2021-02-27 ENCOUNTER — Other Ambulatory Visit: Payer: Self-pay

## 2021-02-27 ENCOUNTER — Encounter: Payer: Self-pay | Admitting: Hematology & Oncology

## 2021-02-27 ENCOUNTER — Inpatient Hospital Stay: Payer: Medicare Other | Attending: Hematology & Oncology

## 2021-02-27 ENCOUNTER — Inpatient Hospital Stay (HOSPITAL_BASED_OUTPATIENT_CLINIC_OR_DEPARTMENT_OTHER): Payer: Medicare Other | Admitting: Hematology & Oncology

## 2021-02-27 VITALS — BP 124/60 | HR 71 | Temp 98.1°F | Resp 18 | Wt 216.2 lb

## 2021-02-27 VITALS — BP 157/83 | HR 63 | Resp 18

## 2021-02-27 DIAGNOSIS — C9002 Multiple myeloma in relapse: Secondary | ICD-10-CM | POA: Diagnosis present

## 2021-02-27 DIAGNOSIS — C9000 Multiple myeloma not having achieved remission: Secondary | ICD-10-CM

## 2021-02-27 DIAGNOSIS — D801 Nonfamilial hypogammaglobulinemia: Secondary | ICD-10-CM

## 2021-02-27 DIAGNOSIS — Z5112 Encounter for antineoplastic immunotherapy: Secondary | ICD-10-CM | POA: Insufficient documentation

## 2021-02-27 LAB — CBC WITH DIFFERENTIAL (CANCER CENTER ONLY)
Abs Immature Granulocytes: 0.02 10*3/uL (ref 0.00–0.07)
Basophils Absolute: 0 10*3/uL (ref 0.0–0.1)
Basophils Relative: 1 %
Eosinophils Absolute: 0.1 10*3/uL (ref 0.0–0.5)
Eosinophils Relative: 1 %
HCT: 49.2 % (ref 39.0–52.0)
Hemoglobin: 17 g/dL (ref 13.0–17.0)
Immature Granulocytes: 0 %
Lymphocytes Relative: 29 %
Lymphs Abs: 2 10*3/uL (ref 0.7–4.0)
MCH: 33.5 pg (ref 26.0–34.0)
MCHC: 34.6 g/dL (ref 30.0–36.0)
MCV: 97 fL (ref 80.0–100.0)
Monocytes Absolute: 1 10*3/uL (ref 0.1–1.0)
Monocytes Relative: 14 %
Neutro Abs: 3.9 10*3/uL (ref 1.7–7.7)
Neutrophils Relative %: 55 %
Platelet Count: 138 10*3/uL — ABNORMAL LOW (ref 150–400)
RBC: 5.07 MIL/uL (ref 4.22–5.81)
RDW: 13.2 % (ref 11.5–15.5)
WBC Count: 7.1 10*3/uL (ref 4.0–10.5)
nRBC: 0 % (ref 0.0–0.2)

## 2021-02-27 LAB — CMP (CANCER CENTER ONLY)
ALT: 23 U/L (ref 0–44)
AST: 24 U/L (ref 15–41)
Albumin: 3.8 g/dL (ref 3.5–5.0)
Alkaline Phosphatase: 63 U/L (ref 38–126)
Anion gap: 6 (ref 5–15)
BUN: 24 mg/dL — ABNORMAL HIGH (ref 8–23)
CO2: 27 mmol/L (ref 22–32)
Calcium: 8.8 mg/dL — ABNORMAL LOW (ref 8.9–10.3)
Chloride: 106 mmol/L (ref 98–111)
Creatinine: 0.92 mg/dL (ref 0.61–1.24)
GFR, Estimated: >60 mL/min (ref >60)
Glucose, Bld: 137 mg/dL — ABNORMAL HIGH (ref 70–99)
Potassium: 4 mmol/L (ref 3.5–5.1)
Sodium: 139 mmol/L (ref 135–145)
Total Bilirubin: 0.8 mg/dL (ref 0.3–1.2)
Total Protein: 5.6 g/dL — ABNORMAL LOW (ref 6.5–8.1)

## 2021-02-27 LAB — LACTATE DEHYDROGENASE: LDH: 173 U/L (ref 98–192)

## 2021-02-27 MED ORDER — DEXAMETHASONE 4 MG PO TABS
8.0000 mg | ORAL_TABLET | Freq: Once | ORAL | Status: AC
  Administered 2021-02-27: 8 mg via ORAL
  Filled 2021-02-27: qty 2

## 2021-02-27 MED ORDER — ALTEPLASE 2 MG IJ SOLR: 2.0000 mg | Freq: Once | INTRAMUSCULAR | Status: DC | PRN

## 2021-02-27 MED ORDER — DEXTROSE 5 % IV SOLN: INTRAVENOUS | Status: DC

## 2021-02-27 MED ORDER — DIPHENHYDRAMINE HCL 25 MG PO CAPS
50.0000 mg | ORAL_CAPSULE | Freq: Once | ORAL | Status: AC
  Administered 2021-02-27: 50 mg via ORAL
  Filled 2021-02-27: qty 2

## 2021-02-27 MED ORDER — ACETAMINOPHEN 325 MG PO TABS
650.0000 mg | ORAL_TABLET | Freq: Once | ORAL | Status: AC
  Administered 2021-02-27: 650 mg via ORAL
  Filled 2021-02-27: qty 2

## 2021-02-27 MED ORDER — IMMUNE GLOBULIN (HUMAN) 20 GM/200ML IV SOLN
40.0000 g | Freq: Once | INTRAVENOUS | Status: AC
  Administered 2021-02-27: 40 g via INTRAVENOUS
  Filled 2021-02-27: qty 400

## 2021-02-27 MED ORDER — DARATUMUMAB-HYALURONIDASE-FIHJ 1800-30000 MG-UT/15ML ~~LOC~~ SOLN
1800.0000 mg | Freq: Once | SUBCUTANEOUS | Status: AC
  Administered 2021-02-27: 1800 mg via SUBCUTANEOUS
  Filled 2021-02-27: qty 15

## 2021-02-27 NOTE — Progress Notes (Signed)
Hematology and Oncology Follow Up Visit  Rickey [name] [id number] 13-Oct-1940 80 y.o. 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 Elotuzumab/Pomalidomide/decadron -every 4 weeks- start 01/12/2018 Kyprolis/Pomalidomide/Decadron -- s/p cycle #9 -- d/c pomaldomide in 01/2019 Xpovio 80 mg po q week -- start on 12/26/2019 - d/c on 04/2020 Daratumumab -started cycle 1 in January 2022   Interim History:  Rickey [name] is back for follow-up.  Looks like the like changes slowly trending upward.  When we last saw him, his light chain was 3.6 mg/dL.  He has an appointment with Dr. Melba Coon next week.  I spoke with Dr. Philipp Ovens about the situation.  I am sure Dr. Philipp Ovens will consider Rickey [name] for a clinical trial.  Otherwise, Rickey [name] is feeling fine.  He [place]  They actually are at their daughter's house in [place].  Her daughter's family is up and main right now.  He has had no problems with nausea or vomiting.  There is been no problems with pain.  His blood sugars under very good control.  He has had no change in bowel or bladder habits.  There is been no bleeding.  He has had no rashes.  There has been no leg swelling.  He recently had Mohs surgery for a basal cell carcinoma.  He has little bit of edema about the right eye.  Overall, his performance status is ECOG 1.    Medications:  Allergies as of 02/27/2021       Reactions   Ace Inhibitors Cough        Medication List        Accurate as of February 27, 2021 10:15 AM. If you have any questions, ask your nurse or doctor.          acyclovir 400 MG tablet Commonly known as: ZOVIRAX 2 (two) times daily.   aspirin  81 MG EC tablet daily.   atorvastatin 10 MG tablet Commonly known as: LIPITOR Take 1 tablet by mouth daily.   B-D INS SYR ULTRAFINE .5CC/30G 30G X 1/2" 0.5 ML Misc Generic drug: Insulin Syringe-Needle U-100 Use as directed with insulin 4-5 times a day   CALCIUM CARBONATE PO Take 220 mg by mouth 2 (two) times daily.   Cartridge Pump Misc Inject into the skin.   cycloSPORINE 0.05 % ophthalmic emulsion Commonly known as: RESTASIS Place 1 drop into both eyes 2 (two) times daily.   Darzalex 400 MG/20ML Generic drug: daratumumab See admin instructions.   fish oil-omega-3 fatty acids 1000 MG capsule Take 1 g by mouth 2 (two) times daily.   freestyle lancets Test blood sugars 3 times a day as directed.  E10.9   FREESTYLE LITE test strip Generic drug: glucose blood Test CBG's 3 times a day or as directed  E10.9   INSULIN LISP & LISP PROT (HUM) [place]   insulin lispro 100 UNIT/ML injection Commonly known as: HUMALOG Inject into the skin. Insulin pump 12/21/2018 Typically 80U daily.   irbesartan-hydrochlorothiazide 300-12.5 MG tablet Commonly known as: AVALIDE Take 1 tablet by mouth daily.   lidocaine-prilocaine cream Commonly known as: EMLA Apply 1 application topically as needed.   montelukast 10 MG tablet Commonly known as:  SINGULAIR Take 1 tablet by mouth daily.   Vitamin D 50 MCG (2000 UT) Caps Take by mouth every morning.   Vitamin K2-Vitamin D3 45-2000 MCG-UNIT Caps daily.   zolpidem 5 MG tablet Commonly known as: AMBIEN Take 5 mg by mouth at bedtime.        Allergies:  Allergies  Allergen Reactions   Ace Inhibitors Cough    Past Medical History, Surgical history, Social history, and Family History were reviewed and updated.  Review of Systems: Review of Systems  Constitutional: Negative.   HENT: Negative.    Eyes: Negative.   Respiratory: Negative.    Cardiovascular: Negative.   Gastrointestinal: Negative.   Genitourinary: Negative.    Musculoskeletal: Negative.   Skin: Negative.   Neurological: Negative.   Endo/Heme/Allergies: Negative.   Psychiatric/Behavioral: Negative.      Physical Exam:  weight is 216 lb 4 oz (98.1 kg). His oral temperature is 98.1 F (36.7 C). His blood pressure is 124/60 and his pulse is 71. His respiration is 18 and oxygen saturation is 100%.   Wt Readings from Last 3 Encounters:  02/27/21 216 lb 4 oz (98.1 kg)  01/23/21 209 lb 12.8 oz (95.2 kg)  12/26/20 208 lb (94.3 kg)    Physical Exam Vitals reviewed.  HENT:     Head: Normocephalic and atraumatic.  Eyes:     Pupils: Pupils are equal, round, and reactive to light.  Cardiovascular:     Rate and Rhythm: Normal rate and regular rhythm.     Heart sounds: Normal heart sounds.  Pulmonary:     Effort: Pulmonary effort is normal.     Breath sounds: Normal breath sounds.  Abdominal:     General: Bowel sounds are normal.     Palpations: Abdomen is soft.  Musculoskeletal:        General: No tenderness or deformity. Normal range of motion.     Cervical back: Normal range of motion.  Lymphadenopathy:     Cervical: No cervical adenopathy.  Skin:    General: Skin is warm and dry.     Findings: No erythema or rash.  Neurological:     Mental Status: He is alert and oriented to person, place, and time.  Psychiatric:        Behavior: Behavior normal.        Thought Content: Thought content normal.        Judgment: Judgment normal.   Lab Results  Component Value Date   WBC 7.1 02/27/2021   HGB 17.0 02/27/2021   HCT 49.2 02/27/2021   MCV 97.0 02/27/2021   PLT 138 (L) 02/27/2021   Lab Results  Component Value Date   FERRITIN 258 12/28/2018   IRON 98 12/28/2018   TIBC 253 12/28/2018   UIBC 154 12/28/2018   IRONPCTSAT 39 12/28/2018   Lab Results  Component Value Date   RBC 5.07 02/27/2021   Lab Results  Component Value Date   KPAFRELGTCHN 36.4 (H) 01/23/2021   LAMBDASER 1.7 (L) 01/23/2021   KAPLAMBRATIO 21.41 (H)  01/23/2021   Lab Results  Component Value Date   IGGSERUM 644 01/23/2021   IGA 7 (L) 01/23/2021   IGMSERUM <5 (L) 01/23/2021   Lab Results  Component Value Date   TOTALPROTELP 5.6 (L) 01/23/2021   ALBUMINELP 3.4 01/23/2021   A1GS 0.2 01/23/2021   A2GS 0.7 01/23/2021   BETS 0.8 01/23/2021   BETA2SER 0.3 04/19/2015   GAMS 0.5 01/23/2021   MSPIKE Not Observed 01/23/2021  SPEI Comment 11/23/2020     Chemistry      Component Value Date/Time   NA 139 02/27/2021 0929   NA 142 04/23/2017 1431   NA 139 07/18/2016 1135   K 4.0 02/27/2021 0929   K 4.0 04/23/2017 1431   K 4.4 07/18/2016 1135   CL 106 02/27/2021 0929   CL 105 04/23/2017 1431   CO2 27 02/27/2021 0929   CO2 28 04/23/2017 1431   CO2 27 07/18/2016 1135   BUN 24 (H) 02/27/2021 0929   BUN 20 04/23/2017 1431   BUN 12.2 07/18/2016 1135   CREATININE 0.92 02/27/2021 0929   CREATININE 1.2 04/23/2017 1431   CREATININE 0.9 07/18/2016 1135      Component Value Date/Time   CALCIUM 8.8 (L) 02/27/2021 0929   CALCIUM 9.3 04/23/2017 1431   CALCIUM 8.9 07/18/2016 1135   ALKPHOS 63 02/27/2021 0929   ALKPHOS 64 04/23/2017 1431   ALKPHOS 58 07/18/2016 1135   AST 24 02/27/2021 0929   AST 15 07/18/2016 1135   ALT 23 02/27/2021 0929   ALT 22 04/23/2017 1431   ALT 20 07/18/2016 1135   BILITOT 0.8 02/27/2021 0929   BILITOT 1.24 (H) 07/18/2016 1135      Impression and Plan: Rickey [name] is a very pleasant 80 yo caucasian gentleman with 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   Will be interesting to see what the light chain level is now.  We will continue him on the daratumumab.  Again, I am sure Dr. Philipp Ovens will let us know as to how we can "hold him over" until he can get definitive treatment down in [place].  I would think that if he is put on a clinical trial, he would not get treatment until next year.  His quality of life is still doing quite well.  The light chain really is not that high.   It will be interesting to see what the changes now.  We will still follow him up monthly.  Rickey [name] is happy that he is done so nicely.  I am happy that he is enjoyed the not home.  Actually, their home was filled for [employer] for the April 2023 issue.    [name], MD 10/4/202210:15 AM

## 2021-02-27 NOTE — Patient Instructions (Signed)
Immune Globulin Injection What is this medication? IMMUNE GLOBULIN (im MUNE GLOB yoo lin) helps to prevent or reduce the severity of certain infections in patients who are at risk. This medicine is collected from the pooled blood of many donors. It is used to treat immune systemproblems, thrombocytopenia, and Kawasaki syndrome. This medicine may be used for other purposes; ask your health care provider orpharmacist if you have questions. COMMON BRAND NAME(S): ASCENIV, Baygam, BIVIGAM, Carimune, Carimune NF, cutaquig, Cuvitru, Flebogamma, Flebogamma DIF, GamaSTAN, GamaSTAN S/D, Gamimune N, Gammagard, Gammagard S/D, Gammaked, Gammaplex, Gammar-P IV, Gamunex, Gamunex-C, Hizentra, Iveegam, Iveegam EN, Octagam, Panglobulin, Panglobulin NF, panzyga, Polygam S/D, Privigen, Sandoglobulin, Venoglobulin-S, Vigam,Vivaglobulin, Xembify What should I tell my care team before I take this medication? They need to know if you have any of these conditions: diabetes extremely low or no immune antibodies in the blood heart disease history of blood clots hyperprolinemia infection in the blood, sepsis kidney disease recently received or scheduled to receive a vaccination an unusual or allergic reaction to human immune globulin, albumin, maltose, sucrose, other medicines, foods, dyes, or preservatives pregnant or trying to get pregnant breast-feeding How should I use this medication? This medicine is for injection into a muscle or infusion into a vein or skin. It is usually given by a health care professional in a hospital or clinicsetting. In rare cases, some brands of this medicine might be given at home. You will be taught how to give this medicine. Use exactly as directed. Take your medicineat regular intervals. Do not take your medicine more often than directed. Talk to your pediatrician regarding the use of this medicine in children. Whilethis drug may be prescribed for selected conditions, precautions do  apply. Overdosage: If you think you have taken too much of this medicine contact apoison control center or emergency room at once. NOTE: This medicine is only for you. Do not share this medicine with others. What if I miss a dose? It is important not to miss your dose. Call your doctor or health care professional if you are unable to keep an appointment. If you give yourself the medicine and you miss a dose, take it as soon as you can. If it is almost timefor your next dose, take only that dose. Do not take double or extra doses. What may interact with this medication? aspirin and aspirin-like medicines cisplatin cyclosporine medicines for infection like acyclovir, adefovir, amphotericin B, bacitracin, cidofovir, foscarnet, ganciclovir, gentamicin, pentamidine, vancomycin NSAIDS, medicines for pain and inflammation, like ibuprofen or naproxen pamidronate vaccines zoledronic acid This list may not describe all possible interactions. Give your health care provider a list of all the medicines, herbs, non-prescription drugs, or dietary supplements you use. Also tell them if you smoke, drink alcohol, or use illegaldrugs. Some items may interact with your medicine. What should I watch for while using this medication? Your condition will be monitored carefully while you are receiving thismedicine. This medicine is made from pooled blood donations of many different people. It may be possible to pass an infection in this medicine. However, the donors are screened for infections and all products are tested for HIV and hepatitis. The medicine is treated to kill most or all bacteria and viruses. Talk to yourdoctor about the risks and benefits of this medicine. Do not have vaccinations for at least 14 days before, or until at least 3months after receiving this medicine. What side effects may I notice from receiving this medication? Side effects that you should report to your doctor   or health care  professionalas soon as possible: allergic reactions like skin rash, itching or hives, swelling of the face, lips, or tongue blue colored lips or skin breathing problems chest pain or tightness fever signs and symptoms of aseptic meningitis such as stiff neck; sensitivity to light; headache; drowsiness; fever; nausea; vomiting; rash signs and symptoms of a blood clot such as chest pain; shortness of breath; pain, swelling, or warmth in the leg signs and symptoms of hemolytic anemia such as fast heartbeat; tiredness; dark yellow or brown urine; or yellowing of the eyes or skin signs and symptoms of kidney injury like trouble passing urine or change in the amount of urine sudden weight gain swelling of the ankles, feet, hands Side effects that usually do not require medical attention (report to yourdoctor or health care professional if they continue or are bothersome): diarrhea flushing headache increased sweating joint pain muscle cramps muscle pain nausea pain, redness, or irritation at site where injected tiredness This list may not describe all possible side effects. Call your doctor for medical advice about side effects. You may report side effects to FDA at1-800-FDA-1088. Where should I keep my medication? Keep out of the reach of children. This drug is usually given in a hospital or clinic and will not be stored athome. In rare cases, some brands of this medicine may be given at home. If you are using this medicine at home, you will be instructed on how to store thismedicine. Throw away any unused medicine after the expiration date on the label. NOTE: This sheet is a summary. It may not cover all possible information. If you have questions about this medicine, talk to your doctor, pharmacist, orhealth care provider.  2022 Elsevier/Gold Standard (2018-12-16 12:51:14)  

## 2021-02-28 LAB — IGG, IGA, IGM
IgA: 8 mg/dL — ABNORMAL LOW (ref 61–437)
IgG (Immunoglobin G), Serum: 440 mg/dL — ABNORMAL LOW (ref 603–1613)
IgM (Immunoglobulin M), Srm: 6 mg/dL — ABNORMAL LOW (ref 15–143)

## 2021-02-28 LAB — KAPPA/LAMBDA LIGHT CHAINS
Kappa free light chain: 35.7 mg/L — ABNORMAL HIGH (ref 3.3–19.4)
Kappa, lambda light chain ratio: 19.83 — ABNORMAL HIGH (ref 0.26–1.65)
Lambda free light chains: 1.8 mg/L — ABNORMAL LOW (ref 5.7–26.3)

## 2021-03-01 LAB — PROTEIN ELECTROPHORESIS, SERUM
A/G Ratio: 1.8 — ABNORMAL HIGH (ref 0.7–1.7)
Albumin ELP: 3.4 g/dL (ref 2.9–4.4)
Alpha-1-Globulin: 0.2 g/dL (ref 0.0–0.4)
Alpha-2-Globulin: 0.6 g/dL (ref 0.4–1.0)
Beta Globulin: 0.7 g/dL (ref 0.7–1.3)
Gamma Globulin: 0.4 g/dL (ref 0.4–1.8)
Globulin, Total: 1.9 g/dL — ABNORMAL LOW (ref 2.2–3.9)
Total Protein ELP: 5.3 g/dL — ABNORMAL LOW (ref 6.0–8.5)

## 2021-03-27 ENCOUNTER — Inpatient Hospital Stay: Payer: Medicare Other

## 2021-03-27 ENCOUNTER — Inpatient Hospital Stay (HOSPITAL_BASED_OUTPATIENT_CLINIC_OR_DEPARTMENT_OTHER): Payer: Medicare Other | Admitting: Family

## 2021-03-27 ENCOUNTER — Encounter: Payer: Self-pay | Admitting: Family

## 2021-03-27 ENCOUNTER — Other Ambulatory Visit: Payer: Self-pay

## 2021-03-27 ENCOUNTER — Inpatient Hospital Stay: Payer: Medicare Other | Attending: Hematology & Oncology

## 2021-03-27 VITALS — BP 149/48 | HR 73 | Temp 97.9°F | Resp 18 | Wt 211.0 lb

## 2021-03-27 VITALS — BP 149/88 | HR 73 | Temp 97.9°F | Resp 18

## 2021-03-27 DIAGNOSIS — R5383 Other fatigue: Secondary | ICD-10-CM | POA: Diagnosis not present

## 2021-03-27 DIAGNOSIS — Z5112 Encounter for antineoplastic immunotherapy: Secondary | ICD-10-CM | POA: Insufficient documentation

## 2021-03-27 DIAGNOSIS — C9000 Multiple myeloma not having achieved remission: Secondary | ICD-10-CM | POA: Diagnosis not present

## 2021-03-27 DIAGNOSIS — D801 Nonfamilial hypogammaglobulinemia: Secondary | ICD-10-CM | POA: Diagnosis not present

## 2021-03-27 DIAGNOSIS — C9002 Multiple myeloma in relapse: Secondary | ICD-10-CM | POA: Diagnosis present

## 2021-03-27 LAB — CMP (CANCER CENTER ONLY)
ALT: 31 U/L (ref 0–44)
AST: 32 U/L (ref 15–41)
Albumin: 3.9 g/dL (ref 3.5–5.0)
Alkaline Phosphatase: 61 U/L (ref 38–126)
Anion gap: 7 (ref 5–15)
BUN: 21 mg/dL (ref 8–23)
CO2: 26 mmol/L (ref 22–32)
Calcium: 9.7 mg/dL (ref 8.9–10.3)
Chloride: 104 mmol/L (ref 98–111)
Creatinine: 1.12 mg/dL (ref 0.61–1.24)
GFR, Estimated: >60 mL/min
Glucose, Bld: 222 mg/dL — ABNORMAL HIGH (ref 70–99)
Potassium: 4.1 mmol/L (ref 3.5–5.1)
Sodium: 137 mmol/L (ref 135–145)
Total Bilirubin: 0.8 mg/dL (ref 0.3–1.2)
Total Protein: 6.1 g/dL — ABNORMAL LOW (ref 6.5–8.1)

## 2021-03-27 LAB — CBC WITH DIFFERENTIAL (CANCER CENTER ONLY)
Abs Immature Granulocytes: 0.01 10*3/uL (ref 0.00–0.07)
Basophils Absolute: 0.1 10*3/uL (ref 0.0–0.1)
Basophils Relative: 1 %
Eosinophils Absolute: 0.1 10*3/uL (ref 0.0–0.5)
Eosinophils Relative: 1 %
HCT: 51.6 % (ref 39.0–52.0)
Hemoglobin: 17.8 g/dL — ABNORMAL HIGH (ref 13.0–17.0)
Immature Granulocytes: 0 %
Lymphocytes Relative: 35 %
Lymphs Abs: 2.3 10*3/uL (ref 0.7–4.0)
MCH: 33.2 pg (ref 26.0–34.0)
MCHC: 34.5 g/dL (ref 30.0–36.0)
MCV: 96.3 fL (ref 80.0–100.0)
Monocytes Absolute: 0.8 10*3/uL (ref 0.1–1.0)
Monocytes Relative: 13 %
Neutro Abs: 3.2 10*3/uL (ref 1.7–7.7)
Neutrophils Relative %: 50 %
Platelet Count: 136 10*3/uL — ABNORMAL LOW (ref 150–400)
RBC: 5.36 MIL/uL (ref 4.22–5.81)
RDW: 13.2 % (ref 11.5–15.5)
WBC Count: 6.4 10*3/uL (ref 4.0–10.5)
nRBC: 0 % (ref 0.0–0.2)

## 2021-03-27 MED ORDER — DEXAMETHASONE 4 MG PO TABS
8.0000 mg | ORAL_TABLET | Freq: Once | ORAL | Status: AC
  Administered 2021-03-27: 8 mg via ORAL
  Filled 2021-03-27: qty 2

## 2021-03-27 MED ORDER — DEXTROSE 5 % IV SOLN: INTRAVENOUS | Status: DC

## 2021-03-27 MED ORDER — SODIUM CHLORIDE 0.9% FLUSH: 10.0000 mL | Freq: Once | INTRAVENOUS | Status: DC

## 2021-03-27 MED ORDER — ACETAMINOPHEN 325 MG PO TABS
650.0000 mg | ORAL_TABLET | Freq: Once | ORAL | Status: AC
  Administered 2021-03-27: 650 mg via ORAL
  Filled 2021-03-27: qty 2

## 2021-03-27 MED ORDER — DIPHENHYDRAMINE HCL 25 MG PO CAPS
50.0000 mg | ORAL_CAPSULE | Freq: Once | ORAL | Status: AC
  Administered 2021-03-27: 50 mg via ORAL
  Filled 2021-03-27: qty 2

## 2021-03-27 MED ORDER — DARATUMUMAB-HYALURONIDASE-FIHJ 1800-30000 MG-UT/15ML ~~LOC~~ SOLN
1800.0000 mg | Freq: Once | SUBCUTANEOUS | Status: AC
  Administered 2021-03-27: 1800 mg via SUBCUTANEOUS
  Filled 2021-03-27: qty 15

## 2021-03-27 MED ORDER — IMMUNE GLOBULIN (HUMAN) 20 GM/200ML IV SOLN
40.0000 g | Freq: Once | INTRAVENOUS | Status: AC
  Administered 2021-03-27: 40 g via INTRAVENOUS
  Filled 2021-03-27: qty 400

## 2021-03-27 MED ORDER — SODIUM CHLORIDE 0.9% FLUSH
10.0000 mL | INTRAVENOUS | Status: DC | PRN
  Administered 2021-03-27: 10 mL via INTRAVENOUS

## 2021-03-27 MED ORDER — HEPARIN SOD (PORK) LOCK FLUSH 100 UNIT/ML IV SOLN
500.0000 [IU] | Freq: Once | INTRAVENOUS | Status: AC
  Administered 2021-03-27: 500 [IU] via INTRAVENOUS

## 2021-03-27 NOTE — Patient Instructions (Signed)
[facility]  Discharge Instructions: Thank you for choosing [facility] to provide your oncology and hematology care.   If you have a lab appointment with the Cancer Center, please go directly to the Cancer Center and check in at the registration area.  Wear comfortable clothing and clothing appropriate for easy access to any Portacath or PICC line.   We strive to give you quality time with your provider. You may need to reschedule your appointment if you arrive late (15 or more minutes).  Arriving late affects you and other patients whose appointments are after yours.  Also, if you miss three or more appointments without notifying the office, you may be dismissed from the clinic at the provider's discretion.      For prescription refill requests, have your pharmacy contact our office and allow 72 hours for refills to be completed.    Today you received the following chemotherapy and/or immunotherapy agents  IVIG      To help prevent nausea and vomiting after your treatment, we encourage you to take your nausea medication as directed.  BELOW ARE SYMPTOMS THAT SHOULD BE REPORTED IMMEDIATELY: *FEVER GREATER THAN 100.4 F (38 C) OR HIGHER *CHILLS OR SWEATING *NAUSEA AND VOMITING THAT IS NOT CONTROLLED WITH YOUR NAUSEA MEDICATION *UNUSUAL SHORTNESS OF BREATH *UNUSUAL BRUISING OR BLEEDING *URINARY PROBLEMS (pain or burning when urinating, or frequent urination) *BOWEL PROBLEMS (unusual diarrhea, constipation, pain near the anus) TENDERNESS IN MOUTH AND THROAT WITH OR WITHOUT PRESENCE OF ULCERS (sore throat, sores in mouth, or a toothache) UNUSUAL RASH, SWELLING OR PAIN  UNUSUAL VAGINAL DISCHARGE OR ITCHING   Items with * indicate a potential emergency and should be followed up as soon as possible or go to the Emergency Department if any problems should occur.  Please show the CHEMOTHERAPY ALERT CARD or IMMUNOTHERAPY ALERT CARD at check-in to the  Emergency Department and triage nurse. Should you have questions after your visit or need to cancel or reschedule your appointment, please contact [facility]  [phone] and follow the prompts.  Office hours are 8:00 a.m. to 4:30 p.m. Monday - Friday. Please note that voicemails left after 4:00 p.m. may not be returned until the following business day.  We are closed weekends and major holidays. You have access to a nurse at all times for urgent questions. Please call the main number to the clinic [phone] and follow the prompts.  For any non-urgent questions, you may also contact your provider using MyChart. We now offer e-Visits for anyone 18 and older to request care online for non-urgent symptoms. For details visit [email or web address].   Also download the MyChart app! Go to the app store, search "MyChart", open the app, select [facility], and log in with your MyChart username and password.  Due to Covid, a mask is required upon entering the hospital/clinic. If you do not have a mask, one will be given to you upon arrival. For doctor visits, patients may have 1 support person aged 18 or older with them. For treatment visits, patients cannot have anyone with them due to current Covid guidelines and our immunocompromised population.  

## 2021-03-27 NOTE — Progress Notes (Signed)
Hematology and Oncology Follow Up Visit  Rickey [name] [id number] [birth year] 80 y.o. 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 Elotuzumab/Pomalidomide/decadron -every 4 weeks- start 01/12/2018 Kyprolis/Pomalidomide/Decadron -- s/p cycle #9 -- d/c pomaldomide in 01/2019 Xpovio 80 mg po q week -- start on 12/26/2019 - d/c on 04/2020   Current Therapy:        Daratumumab -started cycle 1 in January 2022 IVIG as indicated    Interim History:  Rickey [name] is here today for follow-up and treatment. He is doing well and has no complaints at this time. He has mild fatigue that is unchanged from baseline.  He was seen by Dr. Philipp Ovens on 10/13 and states that he is now scheduled for bone marrow biopsy, PET scan and T cell retrieval 11/28-11/30. He will then be having CAR T therapy on 06/25/2021. He will have bridging therapy with Venetoclax if warranted pending biopsy results.  No M-spike observed in October. IgG level was 440 and kappa light chains 3.57 mg/dL.  He has had no fever, chills, n/v, cough, rash, dizziness, SOB, chest pain, palpitations, abdominal pain or changes in bowel or bladder habits.  No swelling, tenderness, numbness or tingling in his extremities at this time.  No falls or syncope to report.  He has not noted any blood loss. No bruising or petechiae.  He has been eating well and staying hydrated throughout the day. His weight is stable at 211 lbs.   ECOG Performance Status: 1 - Symptomatic but completely ambulatory  Medications:  Allergies as of 03/27/2021       Reactions   Ace Inhibitors Cough   Other reaction(s): Cough, cough        Medication List        Accurate as of March 27, 2021  1:19 PM. If you have  any questions, ask your nurse or doctor.          acyclovir 400 MG tablet Commonly known as: ZOVIRAX 2 (two) times daily.   aspirin 81 MG EC tablet daily.   atorvastatin 10 MG tablet Commonly known as: LIPITOR Take 1 tablet by mouth daily.   B-D INS SYR ULTRAFINE .5CC/30G 30G X 1/2" 0.5 ML Misc Generic drug: Insulin Syringe-Needle U-100 Use as directed with insulin 4-5 times a day   CALCIUM CARBONATE PO Take 220 mg by mouth 2 (two) times daily.   Cartridge Pump Misc Inject into the skin.   cycloSPORINE 0.05 % ophthalmic emulsion Commonly known as: RESTASIS Place 1 drop into both eyes 2 (two) times daily.   Darzalex 400 MG/20ML Generic drug: daratumumab See admin instructions.   diphenhydramine-acetaminophen 25-500 MG Tabs tablet Commonly known as: TYLENOL PM Take 1 tablet by mouth at bedtime as needed.   fish oil-omega-3 fatty acids 1000 MG capsule Take 1 g by mouth 2 (two) times daily.   freestyle lancets Test blood sugars 3 times a day as directed.  E10.9   FREESTYLE LITE test strip Generic drug: glucose blood Test CBG's 3 times a day or as directed  E10.9   INSULIN LISP & LISP PROT (HUM) [place]   insulin lispro 100 UNIT/ML injection Commonly known as: HUMALOG Inject into the skin. Insulin pump 12/21/2018 Typically 80U daily.   irbesartan-hydrochlorothiazide 300-12.5 MG tablet  Commonly known as: AVALIDE Take 1 tablet by mouth daily.   lidocaine-prilocaine cream Commonly known as: EMLA Apply 1 application topically as needed.   montelukast 10 MG tablet Commonly known as: SINGULAIR Take 1 tablet by mouth daily.   terbinafine 250 MG tablet Commonly known as: LAMISIL Take 250 mg by mouth daily.   Vitamin D 50 MCG (2000 UT) Caps Take by mouth every morning.   Vitamin K2-Vitamin D3 45-2000 MCG-UNIT Caps daily.   zolpidem 5 MG tablet Commonly known as: AMBIEN Take 5 mg by mouth at bedtime.        Allergies:  Allergies  Allergen Reactions    Ace Inhibitors Cough    Other reaction(s): Cough, cough    Past Medical History, Surgical history, Social history, and Family History were reviewed and updated.  Review of Systems: All other 10 point review of systems is negative.   Physical Exam:  weight is 211 lb (95.7 kg). His oral temperature is 97.9 F (36.6 C). His blood pressure is 149/48 (abnormal) and his pulse is 73. His respiration is 18 and oxygen saturation is 97%.   Wt Readings from Last 3 Encounters:  03/27/21 211 lb (95.7 kg)  02/27/21 216 lb 4 oz (98.1 kg)  01/23/21 209 lb 12.8 oz (95.2 kg)    Ocular: Sclerae unicteric, pupils equal, round and reactive to light Ear-nose-throat: Oropharynx clear, dentition fair Lymphatic: No cervical or supraclavicular adenopathy Lungs no rales or rhonchi, good excursion bilaterally Heart regular rate and rhythm, no murmur appreciated Abd soft, nontender, positive bowel sounds MSK no focal spinal tenderness, no joint edema Neuro: non-focal, well-oriented, appropriate affect Breasts: Deferred  Lab Results  Component Value Date   WBC 6.4 03/27/2021   HGB 17.8 (H) 03/27/2021   HCT 51.6 03/27/2021   MCV 96.3 03/27/2021   PLT 136 (L) 03/27/2021   Lab Results  Component Value Date   FERRITIN 258 12/28/2018   IRON 98 12/28/2018   TIBC 253 12/28/2018   UIBC 154 12/28/2018   IRONPCTSAT 39 12/28/2018   Lab Results  Component Value Date   RBC 5.36 03/27/2021   Lab Results  Component Value Date   KPAFRELGTCHN 35.7 (H) 02/27/2021   LAMBDASER 1.8 (L) 02/27/2021   KAPLAMBRATIO 19.83 (H) 02/27/2021   Lab Results  Component Value Date   IGGSERUM 440 (L) 02/27/2021   IGA 8 (L) 02/27/2021   IGMSERUM 6 (L) 02/27/2021   Lab Results  Component Value Date   TOTALPROTELP 5.3 (L) 02/27/2021   ALBUMINELP 3.4 02/27/2021   A1GS 0.2 02/27/2021   A2GS 0.6 02/27/2021   BETS 0.7 02/27/2021   BETA2SER 0.3 04/19/2015   GAMS 0.4 02/27/2021   MSPIKE Not Observed 02/27/2021   SPEI  Comment 02/27/2021     Chemistry      Component Value Date/Time   NA 137 03/27/2021 1133   NA 142 04/23/2017 1431   NA 139 07/18/2016 1135   K 4.1 03/27/2021 1133   K 4.0 04/23/2017 1431   K 4.4 07/18/2016 1135   CL 104 03/27/2021 1133   CL 105 04/23/2017 1431   CO2 26 03/27/2021 1133   CO2 28 04/23/2017 1431   CO2 27 07/18/2016 1135   BUN 21 03/27/2021 1133   BUN 20 04/23/2017 1431   BUN 12.2 07/18/2016 1135   CREATININE 1.12 03/27/2021 1133   CREATININE 1.2 04/23/2017 1431   CREATININE 0.9 07/18/2016 1135      Component Value Date/Time   CALCIUM 9.7 03/27/2021 1133  CALCIUM 9.3 04/23/2017 1431   CALCIUM 8.9 07/18/2016 1135   ALKPHOS 61 03/27/2021 1133   ALKPHOS 64 04/23/2017 1431   ALKPHOS 58 07/18/2016 1135   AST 32 03/27/2021 1133   AST 15 07/18/2016 1135   ALT 31 03/27/2021 1133   ALT 22 04/23/2017 1431   ALT 20 07/18/2016 1135   BILITOT 0.8 03/27/2021 1133   BILITOT 1.24 (H) 07/18/2016 1135       Impression and Plan: Rickey [name] is a very pleasant 80 yo caucasian gentleman with 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  He will be moving forward with CAR T therapy with Dr. Melba Coon starting at the end of the month.  Reviewed plan and labs with Dr. Marin Olp and he will proceed with Daratumumab treatment today as well as IVIG.  Follow-up pending discharge from [facility]. [place] and will let us know when he is back in this area for follow-up.  He will contact our office with any questions or concerns.   Lottie Dawson, NP 11/1/20221:19 PM

## 2021-03-28 ENCOUNTER — Telehealth: Payer: Self-pay | Admitting: *Deleted

## 2021-03-28 LAB — KAPPA/LAMBDA LIGHT CHAINS
Kappa free light chain: 35.3 mg/L — ABNORMAL HIGH (ref 3.3–19.4)
Kappa, lambda light chain ratio: 22.06 — ABNORMAL HIGH (ref 0.26–1.65)
Lambda free light chains: 1.6 mg/L — ABNORMAL LOW (ref 5.7–26.3)

## 2021-03-28 LAB — IGG, IGA, IGM
IgA: 7 mg/dL — ABNORMAL LOW (ref 61–437)
IgG (Immunoglobin G), Serum: 676 mg/dL (ref 603–1613)
IgM (Immunoglobulin M), Srm: 8 mg/dL — ABNORMAL LOW (ref 15–143)

## 2021-03-28 NOTE — Telephone Encounter (Signed)
Per 03/27/21 LOS - Patient will contact our office to schedule follow-up once he has completed treatment with [facility] and has returned from [place] the beach.

## 2021-03-29 ENCOUNTER — Telehealth: Payer: Self-pay

## 2021-03-29 NOTE — Telephone Encounter (Signed)
Patient called inquiring when he needed to come back for tx after his CAR-T treatment. Thinking he needs to come back in December. Called patient back and informed him [facility] will take over his tx until CAR-T is completed, that once his bone biopsy results come back it will help them determine tx interm before CAR-T therapy and they will notify our office if there is anything we need to do and Dr.Ennever will follow up when needed. Patient verbalized understanding and denies any further questions or concerns at this time.

## 2021-03-30 LAB — PROTEIN ELECTROPHORESIS, SERUM, WITH REFLEX
A/G Ratio: 1.6 (ref 0.7–1.7)
Albumin ELP: 3.5 g/dL (ref 2.9–4.4)
Alpha-1-Globulin: 0.2 g/dL (ref 0.0–0.4)
Alpha-2-Globulin: 0.6 g/dL (ref 0.4–1.0)
Beta Globulin: 0.8 g/dL (ref 0.7–1.3)
Gamma Globulin: 0.6 g/dL (ref 0.4–1.8)
Globulin, Total: 2.2 g/dL (ref 2.2–3.9)
Total Protein ELP: 5.7 g/dL — ABNORMAL LOW (ref 6.0–8.5)

## 2021-04-25 ENCOUNTER — Telehealth: Payer: Self-pay | Admitting: Hematology & Oncology

## 2021-04-25 ENCOUNTER — Telehealth: Payer: Self-pay | Admitting: *Deleted

## 2021-04-25 NOTE — Telephone Encounter (Signed)
Message received from patient stating that Dr. Pedro Earls would like for him to receive Darzalex on either Friday, 04/27/21 or Monday, 04/30/21.  Dr. Marin Olp notified and order received for pt to come in on 04/27/21 or 04/30/21 for labs/port flush/to see Dr Marin Olp and Darzalex treatment.  Message sent to scheduling.

## 2021-04-30 ENCOUNTER — Inpatient Hospital Stay: Payer: Medicare Other

## 2021-04-30 ENCOUNTER — Inpatient Hospital Stay: Payer: Medicare Other | Attending: Hematology & Oncology

## 2021-04-30 ENCOUNTER — Telehealth: Payer: Self-pay | Admitting: *Deleted

## 2021-04-30 ENCOUNTER — Other Ambulatory Visit: Payer: Self-pay

## 2021-04-30 ENCOUNTER — Encounter: Payer: Self-pay | Admitting: Hematology & Oncology

## 2021-04-30 ENCOUNTER — Inpatient Hospital Stay (HOSPITAL_BASED_OUTPATIENT_CLINIC_OR_DEPARTMENT_OTHER): Payer: Medicare Other | Admitting: Hematology & Oncology

## 2021-04-30 VITALS — BP 129/72 | HR 84 | Temp 98.6°F | Resp 17 | Wt 210.0 lb

## 2021-04-30 VITALS — BP 151/87 | HR 76 | Temp 98.5°F | Resp 18

## 2021-04-30 DIAGNOSIS — C9002 Multiple myeloma in relapse: Secondary | ICD-10-CM | POA: Diagnosis present

## 2021-04-30 DIAGNOSIS — C9000 Multiple myeloma not having achieved remission: Secondary | ICD-10-CM | POA: Diagnosis not present

## 2021-04-30 DIAGNOSIS — Z5112 Encounter for antineoplastic immunotherapy: Secondary | ICD-10-CM | POA: Insufficient documentation

## 2021-04-30 DIAGNOSIS — D801 Nonfamilial hypogammaglobulinemia: Secondary | ICD-10-CM

## 2021-04-30 LAB — CMP (CANCER CENTER ONLY)
ALT: 26 U/L (ref 0–44)
AST: 29 U/L (ref 15–41)
Albumin: 3.8 g/dL (ref 3.5–5.0)
Alkaline Phosphatase: 62 U/L (ref 38–126)
Anion gap: 8 (ref 5–15)
BUN: 20 mg/dL (ref 8–23)
CO2: 26 mmol/L (ref 22–32)
Calcium: 9.5 mg/dL (ref 8.9–10.3)
Chloride: 106 mmol/L (ref 98–111)
Creatinine: 1.21 mg/dL (ref 0.61–1.24)
GFR, Estimated: >60 mL/min (ref >60)
Glucose, Bld: 166 mg/dL — ABNORMAL HIGH (ref 70–99)
Potassium: 4.1 mmol/L (ref 3.5–5.1)
Sodium: 140 mmol/L (ref 135–145)
Total Bilirubin: 0.7 mg/dL (ref 0.3–1.2)
Total Protein: 6 g/dL — ABNORMAL LOW (ref 6.5–8.1)

## 2021-04-30 LAB — CBC WITH DIFFERENTIAL (CANCER CENTER ONLY)
Abs Immature Granulocytes: 0.03 10*3/uL (ref 0.00–0.07)
Basophils Absolute: 0.1 10*3/uL (ref 0.0–0.1)
Basophils Relative: 1 %
Eosinophils Absolute: 0.1 10*3/uL (ref 0.0–0.5)
Eosinophils Relative: 1 %
HCT: 48.5 % (ref 39.0–52.0)
Hemoglobin: 16.5 g/dL (ref 13.0–17.0)
Immature Granulocytes: 0 %
Lymphocytes Relative: 24 %
Lymphs Abs: 1.8 10*3/uL (ref 0.7–4.0)
MCH: 33.3 pg (ref 26.0–34.0)
MCHC: 34 g/dL (ref 30.0–36.0)
MCV: 97.8 fL (ref 80.0–100.0)
Monocytes Absolute: 0.9 10*3/uL (ref 0.1–1.0)
Monocytes Relative: 12 %
Neutro Abs: 4.7 10*3/uL (ref 1.7–7.7)
Neutrophils Relative %: 62 %
Platelet Count: 143 10*3/uL — ABNORMAL LOW (ref 150–400)
RBC: 4.96 MIL/uL (ref 4.22–5.81)
RDW: 13.2 % (ref 11.5–15.5)
WBC Count: 7.6 10*3/uL (ref 4.0–10.5)
nRBC: 0 % (ref 0.0–0.2)

## 2021-04-30 MED ORDER — DIPHENHYDRAMINE HCL 25 MG PO CAPS
50.0000 mg | ORAL_CAPSULE | Freq: Once | ORAL | Status: AC
  Administered 2021-04-30: 50 mg via ORAL
  Filled 2021-04-30: qty 2

## 2021-04-30 MED ORDER — DARATUMUMAB-HYALURONIDASE-FIHJ 1800-30000 MG-UT/15ML ~~LOC~~ SOLN
1800.0000 mg | Freq: Once | SUBCUTANEOUS | Status: AC
  Administered 2021-04-30: 1800 mg via SUBCUTANEOUS
  Filled 2021-04-30: qty 15

## 2021-04-30 MED ORDER — ACETAMINOPHEN 325 MG PO TABS
650.0000 mg | ORAL_TABLET | Freq: Once | ORAL | Status: AC
  Administered 2021-04-30: 650 mg via ORAL
  Filled 2021-04-30: qty 2

## 2021-04-30 MED ORDER — SODIUM CHLORIDE 0.9% FLUSH
10.0000 mL | Freq: Once | INTRAVENOUS | Status: AC
  Administered 2021-04-30: 10 mL via INTRAVENOUS

## 2021-04-30 MED ORDER — HEPARIN SOD (PORK) LOCK FLUSH 100 UNIT/ML IV SOLN
500.0000 [IU] | Freq: Once | INTRAVENOUS | Status: AC
  Administered 2021-04-30: 500 [IU] via INTRAVENOUS

## 2021-04-30 MED ORDER — DEXAMETHASONE 4 MG PO TABS
8.0000 mg | ORAL_TABLET | Freq: Once | ORAL | Status: AC
  Administered 2021-04-30: 8 mg via ORAL
  Filled 2021-04-30: qty 2

## 2021-04-30 NOTE — Patient Instructions (Signed)

## 2021-04-30 NOTE — Progress Notes (Signed)
Hematology and Oncology Follow Up Visit  BRAINARD HIGHFILL [id number] 23-Jun-1940 80 y.o. 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 Elotuzumab/Pomalidomide/decadron -every 4 weeks- start 01/12/2018 Kyprolis/Pomalidomide/Decadron -- s/p cycle #9 -- d/c pomaldomide in 01/2019 Xpovio 80 mg po q week -- start on 12/26/2019 - d/c on 04/2020 Daratumumab -started cycle 1 in January 2022   Interim History:  Mr. Kahrs is back for follow-up.  Looks like he is got have the CAR-T therapy.  He saw Dr. Philipp Ovens.  He had the T cells collected.  He will be getting the newer CAR-T therapy-Carvikty.  This is done to be on January 30.  Dr. Philipp Ovens wants him to continue the daratumumab.  We will give him a dose today and then when he is at his beach house over the holidays and for January, he will have a down at his doctor at [facility].    He feels well.  His blood sugars are under very good control.  He has had no problems with bowels or bladder.  He has had no cough or shortness of breath.  There has been no rashes.  He has had no headache.  His last kappa light chain was 3.5 mg/dL.  This was in November.  His appetite has been good.  He did have a very nice Thanksgiving with his family.  Currently, I would say his performance status is ECOG 1.    Medications:  Allergies as of 04/30/2021       Reactions   Ace Inhibitors Cough   Other reaction(s): Cough, cough        Medication List        Accurate as of April 30, 2021  2:03 PM. If you have any questions, ask your nurse or doctor.          STOP taking these medications    zolpidem 5 MG tablet Commonly known as: AMBIEN Stopped by: [name], MD       TAKE  these medications    acyclovir 400 MG tablet Commonly known as: ZOVIRAX 2 (two) times daily.   aspirin 81 MG EC tablet daily.   atorvastatin 10 MG tablet Commonly known as: LIPITOR Take 1 tablet by mouth daily.   B-D INS SYR ULTRAFINE .5CC/30G 30G X 1/2" 0.5 ML Misc Generic drug: Insulin Syringe-Needle U-100 Use as directed with insulin 4-5 times a day   CALCIUM CARBONATE PO Take 220 mg by mouth 2 (two) times daily.   Cartridge Pump Misc Inject into the skin.   cycloSPORINE 0.05 % ophthalmic emulsion Commonly known as: RESTASIS Place 1 drop into both eyes 2 (two) times daily.   Darzalex 400 MG/20ML Generic drug: daratumumab See admin instructions.   diphenhydramine-acetaminophen 25-500 MG Tabs tablet Commonly known as: TYLENOL PM Take 1 tablet by mouth at bedtime as needed.   fish oil-omega-3 fatty acids 1000 MG capsule Take 1 g by mouth 2 (two) times daily.   freestyle lancets Test blood sugars 3 times a day as directed.  E10.9   FREESTYLE LITE test strip Generic drug: glucose blood Test CBG's 3 times a day or as directed  E10.9   INSULIN LISP & LISP PROT (HUM) [place]   insulin lispro 100 UNIT/ML injection  Commonly known as: HUMALOG Inject into the skin. Insulin pump 12/21/2018 Typically 80U daily.   irbesartan-hydrochlorothiazide 300-12.5 MG tablet Commonly known as: AVALIDE Take 1 tablet by mouth daily.   lidocaine-prilocaine cream Commonly known as: EMLA Apply 1 application topically as needed.   montelukast 10 MG tablet Commonly known as: SINGULAIR Take 1 tablet by mouth daily.   terbinafine 250 MG tablet Commonly known as: LAMISIL Take 250 mg by mouth daily.   Vitamin D 50 MCG (2000 UT) Caps Take by mouth every morning.   Vitamin K2-Vitamin D3 45-2000 MCG-UNIT Caps daily.        Allergies:  Allergies  Allergen Reactions   Ace Inhibitors Cough    Other reaction(s): Cough, cough    Past Medical History, Surgical history, Social  history, and Family History were reviewed and updated.  Review of Systems: Review of Systems  Constitutional: Negative.   HENT: Negative.    Eyes: Negative.   Respiratory: Negative.    Cardiovascular: Negative.   Gastrointestinal: Negative.   Genitourinary: Negative.   Musculoskeletal: Negative.   Skin: Negative.   Neurological: Negative.   Endo/Heme/Allergies: Negative.   Psychiatric/Behavioral: Negative.      Physical Exam:  weight is 210 lb (95.3 kg). His oral temperature is 98.6 F (37 C). His blood pressure is 129/72 and his pulse is 84. His respiration is 17 and oxygen saturation is 98%.   Wt Readings from Last 3 Encounters:  04/30/21 210 lb (95.3 kg)  03/27/21 211 lb (95.7 kg)  02/27/21 216 lb 4 oz (98.1 kg)    Physical Exam Vitals reviewed.  HENT:     Head: Normocephalic and atraumatic.  Eyes:     Pupils: Pupils are equal, round, and reactive to light.  Cardiovascular:     Rate and Rhythm: Normal rate and regular rhythm.     Heart sounds: Normal heart sounds.  Pulmonary:     Effort: Pulmonary effort is normal.     Breath sounds: Normal breath sounds.  Abdominal:     General: Bowel sounds are normal.     Palpations: Abdomen is soft.  Musculoskeletal:        General: No tenderness or deformity. Normal range of motion.     Cervical back: Normal range of motion.  Lymphadenopathy:     Cervical: No cervical adenopathy.  Skin:    General: Skin is warm and dry.     Findings: No erythema or rash.  Neurological:     Mental Status: He is alert and oriented to person, place, and time.  Psychiatric:        Behavior: Behavior normal.        Thought Content: Thought content normal.        Judgment: Judgment normal.   Lab Results  Component Value Date   WBC 7.6 04/30/2021   HGB 16.5 04/30/2021   HCT 48.5 04/30/2021   MCV 97.8 04/30/2021   PLT 143 (L) 04/30/2021   Lab Results  Component Value Date   FERRITIN 258 12/28/2018   IRON 98 12/28/2018   TIBC 253  12/28/2018   UIBC 154 12/28/2018   IRONPCTSAT 39 12/28/2018   Lab Results  Component Value Date   RBC 4.96 04/30/2021   Lab Results  Component Value Date   KPAFRELGTCHN 35.3 (H) 03/27/2021   LAMBDASER 1.6 (L) 03/27/2021   KAPLAMBRATIO 22.06 (H) 03/27/2021   Lab Results  Component Value Date   IGGSERUM 676 03/27/2021   IGA 7 (L) 03/27/2021   IGMSERUM  8 (L) 03/27/2021   Lab Results  Component Value Date   TOTALPROTELP 5.7 (L) 03/27/2021   ALBUMINELP 3.5 03/27/2021   A1GS 0.2 03/27/2021   A2GS 0.6 03/27/2021   BETS 0.8 03/27/2021   BETA2SER 0.3 04/19/2015   GAMS 0.6 03/27/2021   MSPIKE Not Observed 03/27/2021   SPEI Comment 02/27/2021     Chemistry      Component Value Date/Time   NA 140 04/30/2021 1155   NA 142 04/23/2017 1431   NA 139 07/18/2016 1135   K 4.1 04/30/2021 1155   K 4.0 04/23/2017 1431   K 4.4 07/18/2016 1135   CL 106 04/30/2021 1155   CL 105 04/23/2017 1431   CO2 26 04/30/2021 1155   CO2 28 04/23/2017 1431   CO2 27 07/18/2016 1135   BUN 20 04/30/2021 1155   BUN 20 04/23/2017 1431   BUN 12.2 07/18/2016 1135   CREATININE 1.21 04/30/2021 1155   CREATININE 1.2 04/23/2017 1431   CREATININE 0.9 07/18/2016 1135      Component Value Date/Time   CALCIUM 9.5 04/30/2021 1155   CALCIUM 9.3 04/23/2017 1431   CALCIUM 8.9 07/18/2016 1135   ALKPHOS 62 04/30/2021 1155   ALKPHOS 64 04/23/2017 1431   ALKPHOS 58 07/18/2016 1135   AST 29 04/30/2021 1155   AST 15 07/18/2016 1135   ALT 26 04/30/2021 1155   ALT 22 04/23/2017 1431   ALT 20 07/18/2016 1135   BILITOT 0.7 04/30/2021 1155   BILITOT 1.24 (H) 07/18/2016 1135      Impression and Plan: Mr. 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   I am so happy that he will have the CAR-T therapy.  We will continue to treat him so he will be ready for this.  Sound like he will be there for 5 weeks down in [place].  I just did  not realize he was contacted long.  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  He looks great right now.  He feels good.  I am sure he will come through the CAR-T protocol in great shape.  We will probably will see him back I would think sometime in March after he has the CAR-T therapy.   [name], MD 12/5/20222:03 PM

## 2021-04-30 NOTE — Telephone Encounter (Signed)
Called Dr Prescilla Sours office, spoke with Francesca Jewett to let them know patient will need an appt for the first week of January for Darzalex.  Dr Marin Olp office visit faxed to their office.

## 2021-04-30 NOTE — Patient Instructions (Signed)
[facility]  Discharge Instructions: Thank you for choosing [facility] to provide your oncology and hematology care.   If you have a lab appointment with the [facility], please go directly to the [facility] and check in at the registration area.  Wear comfortable clothing and clothing appropriate for easy access to any Portacath or PICC line.   We strive to give you quality time with your provider. You may need to reschedule your appointment if you arrive late (15 or more minutes).  Arriving late affects you and other patients whose appointments are after yours.  Also, if you miss three or more appointments without notifying the office, you may be dismissed from the clinic at the provider's discretion.      For prescription refill requests, have your pharmacy contact our office and allow 72 hours for refills to be completed.    Today you received the following chemotherapy and/or immunotherapy agents Darzalex      To help prevent nausea and vomiting after your treatment, we encourage you to take your nausea medication as directed.  BELOW ARE SYMPTOMS THAT SHOULD BE REPORTED IMMEDIATELY: *FEVER GREATER THAN 100.4 F (38 C) OR HIGHER *CHILLS OR SWEATING *NAUSEA AND VOMITING THAT IS NOT CONTROLLED WITH YOUR NAUSEA MEDICATION *UNUSUAL SHORTNESS OF BREATH *UNUSUAL BRUISING OR BLEEDING *URINARY PROBLEMS (pain or burning when urinating, or frequent urination) *BOWEL PROBLEMS (unusual diarrhea, constipation, pain near the anus) TENDERNESS IN MOUTH AND THROAT WITH OR WITHOUT PRESENCE OF ULCERS (sore throat, sores in mouth, or a toothache) UNUSUAL RASH, SWELLING OR PAIN  UNUSUAL VAGINAL DISCHARGE OR ITCHING   Items with * indicate a potential emergency and should be followed up as soon as possible or go to the Emergency Department if any problems should occur.  Please show the CHEMOTHERAPY ALERT CARD or IMMUNOTHERAPY ALERT CARD at check-in to the  Emergency Department and triage nurse. Should you have questions after your visit or need to cancel or reschedule your appointment, please contact [facility]  [phone] and follow the prompts.  Office hours are 8:00 a.m. to 4:30 p.m. Monday - Friday. Please note that voicemails left after 4:00 p.m. may not be returned until the following business day.  We are closed weekends and major holidays. You have access to a nurse at all times for urgent questions. Please call the main number to the clinic [phone] and follow the prompts.  For any non-urgent questions, you may also contact your provider using MyChart. We now offer e-Visits for anyone 78 and older to request care online for non-urgent symptoms. For details visit [email or web address].   Also download the MyChart app! Go to the app store, search "MyChart", open the app, select [facility], and log in with your MyChart username and password.  Due to Covid, a mask is required upon entering the hospital/clinic. If you do not have a mask, one will be given to you upon arrival. For doctor visits, patients may have 1 support person aged 78 or older with them. For treatment visits, patients cannot have anyone with them due to current Covid guidelines and our immunocompromised population.

## 2021-05-01 LAB — KAPPA/LAMBDA LIGHT CHAINS
Kappa free light chain: 47.3 mg/L — ABNORMAL HIGH (ref 3.3–19.4)
Kappa, lambda light chain ratio: 26.28 — ABNORMAL HIGH (ref 0.26–1.65)
Lambda free light chains: 1.8 mg/L — ABNORMAL LOW (ref 5.7–26.3)

## 2021-05-01 LAB — IGG, IGA, IGM
IgA: 7 mg/dL — ABNORMAL LOW (ref 61–437)
IgG (Immunoglobin G), Serum: 686 mg/dL (ref 603–1613)
IgM (Immunoglobulin M), Srm: <5 mg/dL — ABNORMAL LOW (ref 15–143)

## 2021-05-02 LAB — PROTEIN ELECTROPHORESIS, SERUM
A/G Ratio: 1.6 (ref 0.7–1.7)
Albumin ELP: 3.6 g/dL (ref 2.9–4.4)
Alpha-1-Globulin: 0.2 g/dL (ref 0.0–0.4)
Alpha-2-Globulin: 0.8 g/dL (ref 0.4–1.0)
Beta Globulin: 0.7 g/dL (ref 0.7–1.3)
Gamma Globulin: 0.6 g/dL (ref 0.4–1.8)
Globulin, Total: 2.3 g/dL (ref 2.2–3.9)
Total Protein ELP: 5.9 g/dL — ABNORMAL LOW (ref 6.0–8.5)

## 2021-06-11 ENCOUNTER — Other Ambulatory Visit: Payer: Self-pay | Admitting: Hematology & Oncology

## 2021-06-14 ENCOUNTER — Ambulatory Visit
Admission: RE | Admit: 2021-06-14 | Discharge: 2021-06-14 | Disposition: A | Discharge status: Home / Self Care | Payer: Medicare Other | Source: Ambulatory Visit | Attending: Internal Medicine | Admitting: Internal Medicine

## 2021-06-14 ENCOUNTER — Other Ambulatory Visit: Payer: Self-pay | Admitting: Internal Medicine

## 2021-06-14 ENCOUNTER — Other Ambulatory Visit (HOSPITAL_COMMUNITY): Payer: Self-pay | Admitting: Internal Medicine

## 2021-06-14 DIAGNOSIS — N289 Disorder of kidney and ureter, unspecified: Secondary | ICD-10-CM

## 2021-06-14 DIAGNOSIS — R7989 Other specified abnormal findings of blood chemistry: Secondary | ICD-10-CM

## 2021-07-03 ENCOUNTER — Other Ambulatory Visit: Payer: Self-pay

## 2021-07-03 DIAGNOSIS — C9000 Multiple myeloma not having achieved remission: Secondary | ICD-10-CM

## 2021-07-04 ENCOUNTER — Inpatient Hospital Stay: Payer: Medicare Other

## 2021-07-04 ENCOUNTER — Inpatient Hospital Stay: Payer: Medicare Other | Attending: Hematology & Oncology

## 2021-07-04 ENCOUNTER — Other Ambulatory Visit: Payer: Self-pay

## 2021-07-04 ENCOUNTER — Inpatient Hospital Stay (HOSPITAL_BASED_OUTPATIENT_CLINIC_OR_DEPARTMENT_OTHER): Payer: Medicare Other | Admitting: Hematology & Oncology

## 2021-07-04 ENCOUNTER — Encounter: Payer: Self-pay | Admitting: Hematology & Oncology

## 2021-07-04 VITALS — BP 122/57 | HR 85 | Temp 98.1°F | Resp 17 | Ht 72.0 in | Wt 199.0 lb

## 2021-07-04 VITALS — BP 128/66

## 2021-07-04 DIAGNOSIS — C9002 Multiple myeloma in relapse: Secondary | ICD-10-CM | POA: Diagnosis present

## 2021-07-04 DIAGNOSIS — C9000 Multiple myeloma not having achieved remission: Secondary | ICD-10-CM | POA: Diagnosis not present

## 2021-07-04 DIAGNOSIS — Z5112 Encounter for antineoplastic immunotherapy: Secondary | ICD-10-CM | POA: Diagnosis not present

## 2021-07-04 LAB — CBC WITH DIFFERENTIAL (CANCER CENTER ONLY)
Abs Immature Granulocytes: 0.04 10*3/uL (ref 0.00–0.07)
Basophils Absolute: 0.1 10*3/uL (ref 0.0–0.1)
Basophils Relative: 1 %
Eosinophils Absolute: 0.1 10*3/uL (ref 0.0–0.5)
Eosinophils Relative: 1 %
HCT: 41.4 % (ref 39.0–52.0)
Hemoglobin: 14.2 g/dL (ref 13.0–17.0)
Immature Granulocytes: 0 %
Lymphocytes Relative: 19 %
Lymphs Abs: 2 10*3/uL (ref 0.7–4.0)
MCH: 33.1 pg (ref 26.0–34.0)
MCHC: 34.3 g/dL (ref 30.0–36.0)
MCV: 96.5 fL (ref 80.0–100.0)
Monocytes Absolute: 1 10*3/uL (ref 0.1–1.0)
Monocytes Relative: 10 %
Neutro Abs: 7.5 10*3/uL (ref 1.7–7.7)
Neutrophils Relative %: 69 %
Platelet Count: 194 10*3/uL (ref 150–400)
RBC: 4.29 MIL/uL (ref 4.22–5.81)
RDW: 12.8 % (ref 11.5–15.5)
WBC Count: 10.8 10*3/uL — ABNORMAL HIGH (ref 4.0–10.5)
nRBC: 0 % (ref 0.0–0.2)

## 2021-07-04 LAB — CMP (CANCER CENTER ONLY)
ALT: 19 U/L (ref 0–44)
AST: 24 U/L (ref 15–41)
Albumin: 3.5 g/dL (ref 3.5–5.0)
Alkaline Phosphatase: 65 U/L (ref 38–126)
Anion gap: 7 (ref 5–15)
BUN: 33 mg/dL — ABNORMAL HIGH (ref 8–23)
CO2: 26 mmol/L (ref 22–32)
Calcium: 9.1 mg/dL (ref 8.9–10.3)
Chloride: 103 mmol/L (ref 98–111)
Creatinine: 1.24 mg/dL (ref 0.61–1.24)
GFR, Estimated: 60 mL/min — ABNORMAL LOW (ref >60)
Glucose, Bld: 206 mg/dL — ABNORMAL HIGH (ref 70–99)
Potassium: 4.5 mmol/L (ref 3.5–5.1)
Sodium: 136 mmol/L (ref 135–145)
Total Bilirubin: 0.6 mg/dL (ref 0.3–1.2)
Total Protein: 5.3 g/dL — ABNORMAL LOW (ref 6.5–8.1)

## 2021-07-04 MED ORDER — DARATUMUMAB-HYALURONIDASE-FIHJ 1800-30000 MG-UT/15ML ~~LOC~~ SOLN
1800.0000 mg | Freq: Once | SUBCUTANEOUS | Status: AC
  Administered 2021-07-04: 1800 mg via SUBCUTANEOUS
  Filled 2021-07-04: qty 15

## 2021-07-04 MED ORDER — HEPARIN SOD (PORK) LOCK FLUSH 100 UNIT/ML IV SOLN
500.0000 [IU] | Freq: Once | INTRAVENOUS | Status: AC
  Administered 2021-07-04: 500 [IU] via INTRAVENOUS

## 2021-07-04 MED ORDER — DEXAMETHASONE 4 MG PO TABS
8.0000 mg | ORAL_TABLET | Freq: Once | ORAL | Status: AC
  Administered 2021-07-04: 8 mg via ORAL
  Filled 2021-07-04: qty 2

## 2021-07-04 MED ORDER — ACETAMINOPHEN 325 MG PO TABS
650.0000 mg | ORAL_TABLET | Freq: Once | ORAL | Status: AC
  Administered 2021-07-04: 650 mg via ORAL
  Filled 2021-07-04: qty 2

## 2021-07-04 MED ORDER — SODIUM CHLORIDE 0.9% FLUSH
10.0000 mL | INTRAVENOUS | Status: DC | PRN
  Administered 2021-07-04: 10 mL via INTRAVENOUS

## 2021-07-04 MED ORDER — DIPHENHYDRAMINE HCL 25 MG PO CAPS
50.0000 mg | ORAL_CAPSULE | Freq: Once | ORAL | Status: AC
  Administered 2021-07-04: 50 mg via ORAL
  Filled 2021-07-04: qty 2

## 2021-07-04 NOTE — Patient Instructions (Signed)

## 2021-07-04 NOTE — Progress Notes (Signed)
Hematology and Oncology Follow Up Visit  ZORIAN GUNDERMAN [id number] 01-07-1941 81 y.o. 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 Elotuzumab/Pomalidomide/decadron -every 4 weeks- start 01/12/2018 Kyprolis/Pomalidomide/Decadron -- s/p cycle #9 -- d/c pomaldomide in 01/2019 Xpovio 80 mg po q week -- start on 12/26/2019 - d/c on 04/2020 Daratumumab -started cycle 1 in January 2022   Interim History:  Mr. Taaffe is back for follow-up.  He actually is back a lot earlier.  He was post to have CAR-T therapy.  Unfortunately, he had COVID.  He then had prostate surgery because of BPH.  He had urinary retention.  He had a catheter placed.  I think he goes back to see the urologist tomorrow.  I think that the CAR-T therapy will be in early March.  His last Huel Cote was in late December.  He got this down at the [facility] where he stays over the wintertime.  Otherwise, he is doing okay.  His blood sugars have been a little bit on the higher side.  Thankfully, the COVID was not all that bad for him.  He has had no problems with bowels or bladder.  He has had no problems with leg swelling.  He has had no joint issues.  Overall, I would say his performance status right now is ECOG 1.    Medications:  Allergies as of 07/04/2021       Reactions   Ace Inhibitors Cough   Other reaction(s): Cough, cough        Medication List        Accurate as of July 04, 2021 10:08 AM. If you have any questions, ask your nurse or doctor.          acyclovir 400 MG tablet Commonly known as: ZOVIRAX TAKE 1 TABLET BY MOUTH TWICE DAILY   aspirin 81 MG EC tablet daily.   atorvastatin 10 MG tablet Commonly known as: LIPITOR Take 1 tablet by mouth  daily.   B-D INS SYR ULTRAFINE .5CC/30G 30G X 1/2" 0.5 ML Misc Generic drug: Insulin Syringe-Needle U-100 Use as directed with insulin 4-5 times a day   CALCIUM CARBONATE PO Take 220 mg by mouth 2 (two) times daily.   Cartridge Pump Misc Inject into the skin.   cycloSPORINE 0.05 % ophthalmic emulsion Commonly known as: RESTASIS Place 1 drop into both eyes 2 (two) times daily.   Darzalex 400 MG/20ML Generic drug: daratumumab See admin instructions.   diphenhydramine-acetaminophen 25-500 MG Tabs tablet Commonly known as: TYLENOL PM Take 1 tablet by mouth at bedtime as needed.   fish oil-omega-3 fatty acids 1000 MG capsule Take 1 g by mouth 2 (two) times daily.   freestyle lancets Test blood sugars 3 times a day as directed.  E10.9   FREESTYLE LITE test strip Generic drug: glucose blood Test CBG's 3 times a day or as directed  E10.9   INSULIN LISP & LISP PROT (HUM) [place]   insulin lispro 100 UNIT/ML injection Commonly known as: HUMALOG Inject into the skin. Insulin pump 12/21/2018 Typically 80U daily.   irbesartan-hydrochlorothiazide 300-12.5 MG tablet Commonly known as: AVALIDE Take 1 tablet by mouth daily.   lidocaine-prilocaine cream Commonly known as: EMLA Apply 1 application topically as needed.   montelukast 10 MG  tablet Commonly known as: SINGULAIR Take 1 tablet by mouth daily.   terbinafine 250 MG tablet Commonly known as: LAMISIL Take 250 mg by mouth daily.   Vitamin D 50 MCG (2000 UT) Caps Take by mouth every morning.   Vitamin K2-Vitamin D3 45-2000 MCG-UNIT Caps daily.        Allergies:  Allergies  Allergen Reactions   Ace Inhibitors Cough    Other reaction(s): Cough, cough    Past Medical History, Surgical history, Social history, and Family History were reviewed and updated.  Review of Systems: Review of Systems  Constitutional: Negative.   HENT: Negative.    Eyes: Negative.   Respiratory: Negative.    Cardiovascular: Negative.    Gastrointestinal: Negative.   Genitourinary: Negative.   Musculoskeletal: Negative.   Skin: Negative.   Neurological: Negative.   Endo/Heme/Allergies: Negative.   Psychiatric/Behavioral: Negative.      Physical Exam:  height is 6' (1.829 m) and weight is 199 lb (90.3 kg). His oral temperature is 98.1 F (36.7 C). His blood pressure is 122/57 (abnormal) and his pulse is 85. His respiration is 17 and oxygen saturation is 100%.   Wt Readings from Last 3 Encounters:  07/04/21 199 lb (90.3 kg)  04/30/21 210 lb (95.3 kg)  03/27/21 211 lb (95.7 kg)    Physical Exam Vitals reviewed.  HENT:     Head: Normocephalic and atraumatic.  Eyes:     Pupils: Pupils are equal, round, and reactive to light.  Cardiovascular:     Rate and Rhythm: Normal rate and regular rhythm.     Heart sounds: Normal heart sounds.  Pulmonary:     Effort: Pulmonary effort is normal.     Breath sounds: Normal breath sounds.  Abdominal:     General: Bowel sounds are normal.     Palpations: Abdomen is soft.  Musculoskeletal:        General: No tenderness or deformity. Normal range of motion.     Cervical back: Normal range of motion.  Lymphadenopathy:     Cervical: No cervical adenopathy.  Skin:    General: Skin is warm and dry.     Findings: No erythema or rash.  Neurological:     Mental Status: He is alert and oriented to person, place, and time.  Psychiatric:        Behavior: Behavior normal.        Thought Content: Thought content normal.        Judgment: Judgment normal.   Lab Results  Component Value Date   WBC 10.8 (H) 07/04/2021   HGB 14.2 07/04/2021   HCT 41.4 07/04/2021   MCV 96.5 07/04/2021   PLT 194 07/04/2021   Lab Results  Component Value Date   FERRITIN 258 12/28/2018   IRON 98 12/28/2018   TIBC 253 12/28/2018   UIBC 154 12/28/2018   IRONPCTSAT 39 12/28/2018   Lab Results  Component Value Date   RBC 4.29 07/04/2021   Lab Results  Component Value Date   KPAFRELGTCHN 47.3  (H) 04/30/2021   LAMBDASER 1.8 (L) 04/30/2021   KAPLAMBRATIO 26.28 (H) 04/30/2021   Lab Results  Component Value Date   IGGSERUM 686 04/30/2021   IGA 7 (L) 04/30/2021   IGMSERUM <5 (L) 04/30/2021   Lab Results  Component Value Date   TOTALPROTELP 5.9 (L) 04/30/2021   ALBUMINELP 3.6 04/30/2021   A1GS 0.2 04/30/2021   A2GS 0.8 04/30/2021   BETS 0.7 04/30/2021   BETA2SER 0.3 04/19/2015  GAMS 0.6 04/30/2021   MSPIKE Not Observed 04/30/2021   SPEI Comment 04/30/2021     Chemistry      Component Value Date/Time   NA 136 07/04/2021 0803   NA 142 04/23/2017 1431   NA 139 07/18/2016 1135   K 4.5 07/04/2021 0803   K 4.0 04/23/2017 1431   K 4.4 07/18/2016 1135   CL 103 07/04/2021 0803   CL 105 04/23/2017 1431   CO2 26 07/04/2021 0803   CO2 28 04/23/2017 1431   CO2 27 07/18/2016 1135   BUN 33 (H) 07/04/2021 0803   BUN 20 04/23/2017 1431   BUN 12.2 07/18/2016 1135   CREATININE 1.24 07/04/2021 0803   CREATININE 1.2 04/23/2017 1431   CREATININE 0.9 07/18/2016 1135      Component Value Date/Time   CALCIUM 9.1 07/04/2021 0803   CALCIUM 9.3 04/23/2017 1431   CALCIUM 8.9 07/18/2016 1135   ALKPHOS 65 07/04/2021 0803   ALKPHOS 64 04/23/2017 1431   ALKPHOS 58 07/18/2016 1135   AST 24 07/04/2021 0803   AST 15 07/18/2016 1135   ALT 19 07/04/2021 0803   ALT 22 04/23/2017 1431   ALT 20 07/18/2016 1135   BILITOT 0.6 07/04/2021 0803   BILITOT 1.24 (H) 07/18/2016 1135      Impression and Plan: Mr. Geimer is a very pleasant 81 yo caucasian gentleman with 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   We will give him a dose of Faspro today.  Again, this will should hold him over until he has the CAR-T therapy.  He says he will be in [place] for about a month to make sure he does not issues with the  CRS.  I hate that he had a COVID.  Again, got through this very nicely.  We will plan to get him back to see [place] depending on when he recovers from the  CAR-T therapy.   [name], MD 2/8/202310:08 AM

## 2021-07-04 NOTE — Patient Instructions (Signed)
[name]  Discharge Instructions: Thank you for choosing [facility] to provide your oncology and hematology care.   If you have a lab appointment with the [facility], please go directly to the [facility] and check in at the registration area.  Wear comfortable clothing and clothing appropriate for easy access to any Portacath or PICC line.   We strive to give you quality time with your provider. You may need to reschedule your appointment if you arrive late (15 or more minutes).  Arriving late affects you and other patients whose appointments are after yours.  Also, if you miss three or more appointments without notifying the office, you may be dismissed from the clinic at the providers discretion.      For prescription refill requests, have your pharmacy contact our office and allow 72 hours for refills to be completed.    Today you received the following chemotherapy and/or immunotherapy agents darzalex      To help prevent nausea and vomiting after your treatment, we encourage you to take your nausea medication as directed.  BELOW ARE SYMPTOMS THAT SHOULD BE REPORTED IMMEDIATELY: *FEVER GREATER THAN 100.4 F (38 C) OR HIGHER *CHILLS OR SWEATING *NAUSEA AND VOMITING THAT IS NOT CONTROLLED WITH YOUR NAUSEA MEDICATION *UNUSUAL SHORTNESS OF BREATH *UNUSUAL BRUISING OR BLEEDING *URINARY PROBLEMS (pain or burning when urinating, or frequent urination) *BOWEL PROBLEMS (unusual diarrhea, constipation, pain near the anus) TENDERNESS IN MOUTH AND THROAT WITH OR WITHOUT PRESENCE OF ULCERS (sore throat, sores in mouth, or a toothache) UNUSUAL RASH, SWELLING OR PAIN  UNUSUAL VAGINAL DISCHARGE OR ITCHING   Items with * indicate a potential emergency and should be followed up as soon as possible or go to the Emergency Department if any problems should occur.  Please show the CHEMOTHERAPY ALERT CARD or IMMUNOTHERAPY ALERT CARD at check-in to the  Emergency Department and triage nurse. Should you have questions after your visit or need to cancel or reschedule your appointment, please contact [facility]  [phone] and follow the prompts.  Office hours are 8:00 a.m. to 4:30 p.m. Monday - Friday. Please note that voicemails left after 4:00 p.m. may not be returned until the following business day.  We are closed weekends and major holidays. You have access to a nurse at all times for urgent questions. Please call the main number to the clinic [phone] and follow the prompts.  For any non-urgent questions, you may also contact your provider using MyChart. We now offer e-Visits for anyone 73 and older to request care online for non-urgent symptoms. For details visit [email or web address].   Also download the MyChart app! Go to the app store, search "MyChart", open the app, select [facility], and log in with your MyChart username and password.  Due to Covid, a mask is required upon entering the hospital/clinic. If you do not have a mask, one will be given to you upon arrival. For doctor visits, patients may have 1 support person aged 76 or older with them. For treatment visits, patients cannot have anyone with them due to current Covid guidelines and our immunocompromised population.

## 2021-07-10 ENCOUNTER — Encounter: Payer: Self-pay | Admitting: Hematology & Oncology

## 2021-07-10 NOTE — Telephone Encounter (Signed)
Erroneous encounter

## 2021-10-16 ENCOUNTER — Other Ambulatory Visit: Payer: Self-pay | Admitting: Hematology & Oncology

## 2021-10-17 ENCOUNTER — Encounter: Payer: Self-pay | Admitting: Hematology & Oncology

## 2021-10-24 ENCOUNTER — Other Ambulatory Visit: Payer: Self-pay | Admitting: *Deleted

## 2021-10-24 DIAGNOSIS — D801 Nonfamilial hypogammaglobulinemia: Secondary | ICD-10-CM

## 2021-10-24 DIAGNOSIS — N1832 Chronic kidney disease, stage 3b: Secondary | ICD-10-CM

## 2021-10-24 DIAGNOSIS — C9000 Multiple myeloma not having achieved remission: Secondary | ICD-10-CM

## 2021-10-25 ENCOUNTER — Encounter: Payer: Self-pay | Admitting: Hematology & Oncology

## 2021-10-25 ENCOUNTER — Inpatient Hospital Stay: Payer: Medicare Other

## 2021-10-25 ENCOUNTER — Inpatient Hospital Stay: Payer: Medicare Other | Admitting: Hematology & Oncology

## 2021-10-25 ENCOUNTER — Inpatient Hospital Stay: Payer: Medicare Other | Attending: Hematology & Oncology

## 2021-10-25 ENCOUNTER — Inpatient Hospital Stay (HOSPITAL_BASED_OUTPATIENT_CLINIC_OR_DEPARTMENT_OTHER): Payer: Medicare Other | Admitting: Hematology & Oncology

## 2021-10-25 VITALS — BP 114/69 | HR 69 | Temp 98.0°F | Resp 18 | Wt 212.1 lb

## 2021-10-25 VITALS — BP 138/73 | HR 68 | Temp 98.6°F | Resp 17

## 2021-10-25 DIAGNOSIS — C9000 Multiple myeloma not having achieved remission: Secondary | ICD-10-CM | POA: Diagnosis not present

## 2021-10-25 DIAGNOSIS — C9002 Multiple myeloma in relapse: Secondary | ICD-10-CM | POA: Diagnosis present

## 2021-10-25 DIAGNOSIS — N1832 Chronic kidney disease, stage 3b: Secondary | ICD-10-CM

## 2021-10-25 DIAGNOSIS — D801 Nonfamilial hypogammaglobulinemia: Secondary | ICD-10-CM

## 2021-10-25 MED ORDER — SODIUM CHLORIDE 0.9% FLUSH
10.0000 mL | Freq: Once | INTRAVENOUS | Status: AC
  Administered 2021-10-25: 10 mL via INTRAVENOUS

## 2021-10-25 MED ORDER — IMMUNE GLOBULIN (HUMAN) 20 GM/200ML IV SOLN
40.0000 g | Freq: Once | INTRAVENOUS | Status: AC
  Administered 2021-10-25: 40 g via INTRAVENOUS
  Filled 2021-10-25: qty 400

## 2021-10-25 MED ORDER — DEXTROSE 5 % IV SOLN: INTRAVENOUS | Status: DC

## 2021-10-25 MED ORDER — HEPARIN SOD (PORK) LOCK FLUSH 100 UNIT/ML IV SOLN
500.0000 [IU] | Freq: Once | INTRAVENOUS | Status: AC
  Administered 2021-10-25: 500 [IU] via INTRAVENOUS

## 2021-10-25 NOTE — Patient Instructions (Signed)
[facility]  Discharge Instructions: Thank you for choosing [facility] to provide your oncology and hematology care.   If you have a lab appointment with the Cancer Center, please go directly to the Cancer Center and check in at the registration area.  Wear comfortable clothing and clothing appropriate for easy access to any Portacath or PICC line.   We strive to give you quality time with your provider. You may need to reschedule your appointment if you arrive late (15 or more minutes).  Arriving late affects you and other patients whose appointments are after yours.  Also, if you miss three or more appointments without notifying the office, you may be dismissed from the clinic at the provider's discretion.      For prescription refill requests, have your pharmacy contact our office and allow 72 hours for refills to be completed.    Today you received the following chemotherapy and/or immunotherapy agents  IVIG      To help prevent nausea and vomiting after your treatment, we encourage you to take your nausea medication as directed.  BELOW ARE SYMPTOMS THAT SHOULD BE REPORTED IMMEDIATELY: *FEVER GREATER THAN 100.4 F (38 C) OR HIGHER *CHILLS OR SWEATING *NAUSEA AND VOMITING THAT IS NOT CONTROLLED WITH YOUR NAUSEA MEDICATION *UNUSUAL SHORTNESS OF BREATH *UNUSUAL BRUISING OR BLEEDING *URINARY PROBLEMS (pain or burning when urinating, or frequent urination) *BOWEL PROBLEMS (unusual diarrhea, constipation, pain near the anus) TENDERNESS IN MOUTH AND THROAT WITH OR WITHOUT PRESENCE OF ULCERS (sore throat, sores in mouth, or a toothache) UNUSUAL RASH, SWELLING OR PAIN  UNUSUAL VAGINAL DISCHARGE OR ITCHING   Items with * indicate a potential emergency and should be followed up as soon as possible or go to the Emergency Department if any problems should occur.  Please show the CHEMOTHERAPY ALERT CARD or IMMUNOTHERAPY ALERT CARD at check-in to the  Emergency Department and triage nurse. Should you have questions after your visit or need to cancel or reschedule your appointment, please contact [facility]  [phone] and follow the prompts.  Office hours are 8:00 a.m. to 4:30 p.m. Monday - Friday. Please note that voicemails left after 4:00 p.m. may not be returned until the following business day.  We are closed weekends and major holidays. You have access to a nurse at all times for urgent questions. Please call the main number to the clinic [phone] and follow the prompts.  For any non-urgent questions, you may also contact your provider using MyChart. We now offer e-Visits for anyone 18 and older to request care online for non-urgent symptoms. For details visit [email or web address].   Also download the MyChart app! Go to the app store, search "MyChart", open the app, select [facility], and log in with your MyChart username and password.  Due to Covid, a mask is required upon entering the hospital/clinic. If you do not have a mask, one will be given to you upon arrival. For doctor visits, patients may have 1 support person aged 18 or older with them. For treatment visits, patients cannot have anyone with them due to current Covid guidelines and our immunocompromised population.  

## 2021-10-25 NOTE — Progress Notes (Signed)
Pt declined to stay for post infusion observation period. Pt stated he has tolerated medication multiple times prior without difficulty. Pt aware to call clinic with any questions or concerns. Pt verbalized understanding and had no further questions.   

## 2021-10-25 NOTE — Progress Notes (Signed)
Hematology and Oncology Follow Up Visit  YSABEL COWGILL [id number] [birth year] 81 y.o. 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 Elotuzumab/Pomalidomide/decadron -every 4 weeks- start 01/12/2018 Kyprolis/Pomalidomide/Decadron -- s/p cycle #9 -- d/c pomaldomide in 01/2019 Xpovio 80 mg po q week -- start on 12/26/2019 - d/c on 04/2020 Daratumumab -started cycle 1 in January 2022 CARVYTI --given on 07/2021 -[place] IVIG --40 g Q 6 weeks --start on 10/25/2021   Interim History:  Mr. Falwell is back for follow-up.  He looks incredible.  He has done in incredibly well.  He actually had Carvyti therapy.  I think this was back in March.  He subsequently has been in remission.  He has had a normal bone marrow test.  There is no abnormal cytogenetics.  He clearly is in MRD negative status.  He had no complications from the [facility].  There is no CRS or ICANS.  I am just so happy that he is done incredibly well.  We do need to have him on IVIG now.  His IgG level that was checked a week ago was 456 mg/dL.  He will get this every 6 weeks.  This might happen for 1 year.  He has had a good appetite.  He is back home up in [facility] where he has his [place] house.  He has had no diarrhea.  He has had no neuropathy.  His blood sugars have been under very good control.  He does have a insulin pump.  Currently, I would say his performance status is probably ECOG 1.  Medications:  Allergies as of 10/25/2021       Reactions   Ace Inhibitors Cough   Other reaction(s): Cough, cough        Medication List        Accurate as of October 25, 2021 11:01 AM. If you have any questions, ask your nurse or doctor.          acyclovir  400 MG tablet Commonly known as: ZOVIRAX TAKE 1 TABLET(400 MG) BY MOUTH TWICE DAILY   aspirin EC 81 MG tablet daily.   atorvastatin 10 MG tablet Commonly known as: LIPITOR Take 1 tablet by mouth daily.   B-D INS SYR ULTRAFINE .5CC/30G 30G X 1/2" 0.5 ML Misc Generic drug: Insulin Syringe-Needle U-100 Use as directed with insulin 4-5 times a day   CALCIUM CARBONATE PO Take 220 mg by mouth 2 (two) times daily.   Cartridge Pump Misc Inject into the skin.   cycloSPORINE 0.05 % ophthalmic emulsion Commonly known as: RESTASIS Place 1 drop into both eyes 2 (two) times daily.   Darzalex 400 MG/20ML Generic drug: daratumumab See admin instructions.   diphenhydramine-acetaminophen 25-500 MG Tabs tablet Commonly known as: TYLENOL PM Take 1 tablet by mouth at bedtime as needed.   fish oil-omega-3 fatty acids 1000 MG capsule Take 1 g by mouth 2 (two) times daily.   freestyle lancets Test blood sugars 3 times a day as directed.  E10.9   FREESTYLE LITE test strip Generic drug: glucose blood Test CBG's 3 times a day or as directed  E10.9   INSULIN LISP & LISP PROT (HUM) [place]   insulin lispro 100 UNIT/ML injection Commonly known as: HUMALOG Inject into  the skin. Insulin pump 12/21/2018 Typically 80U daily.   irbesartan-hydrochlorothiazide 300-12.5 MG tablet Commonly known as: AVALIDE Take 1 tablet by mouth daily.   lidocaine-prilocaine cream Commonly known as: EMLA Apply 1 application topically as needed.   montelukast 10 MG tablet Commonly known as: SINGULAIR Take 1 tablet by mouth daily.   terbinafine 250 MG tablet Commonly known as: LAMISIL Take 250 mg by mouth daily.   Vitamin D 50 MCG (2000 UT) Caps Take by mouth every morning.   Vitamin K2-Vitamin D3 45-2000 MCG-UNIT Caps daily.        Allergies:  Allergies  Allergen Reactions   Ace Inhibitors Cough    Other reaction(s): Cough, cough    Past Medical History, Surgical history, Social history, and  Family History were reviewed and updated.  Review of Systems: Review of Systems  Constitutional: Negative.   HENT: Negative.    Eyes: Negative.   Respiratory: Negative.    Cardiovascular: Negative.   Gastrointestinal: Negative.   Genitourinary: Negative.   Musculoskeletal: Negative.   Skin: Negative.   Neurological: Negative.   Endo/Heme/Allergies: Negative.   Psychiatric/Behavioral: Negative.      Physical Exam:  weight is 212 lb 1.9 oz (96.2 kg). His oral temperature is 98 F (36.7 C). His blood pressure is 114/69 and his pulse is 69. His respiration is 18 and oxygen saturation is 98%.   Wt Readings from Last 3 Encounters:  10/25/21 212 lb 1.9 oz (96.2 kg)  07/04/21 199 lb (90.3 kg)  04/30/21 210 lb (95.3 kg)    Physical Exam Vitals reviewed.  HENT:     Head: Normocephalic and atraumatic.  Eyes:     Pupils: Pupils are equal, round, and reactive to light.  Cardiovascular:     Rate and Rhythm: Normal rate and regular rhythm.     Heart sounds: Normal heart sounds.  Pulmonary:     Effort: Pulmonary effort is normal.     Breath sounds: Normal breath sounds.  Abdominal:     General: Bowel sounds are normal.     Palpations: Abdomen is soft.  Musculoskeletal:        General: No tenderness or deformity. Normal range of motion.     Cervical back: Normal range of motion.  Lymphadenopathy:     Cervical: No cervical adenopathy.  Skin:    General: Skin is warm and dry.     Findings: No erythema or rash.  Neurological:     Mental Status: He is alert and oriented to person, place, and time.  Psychiatric:        Behavior: Behavior normal.        Thought Content: Thought content normal.        Judgment: Judgment normal.   Lab Results  Component Value Date   WBC 10.8 (H) 07/04/2021   HGB 14.2 07/04/2021   HCT 41.4 07/04/2021   MCV 96.5 07/04/2021   PLT 194 07/04/2021   Lab Results  Component Value Date   FERRITIN 258 12/28/2018   IRON 98 12/28/2018   TIBC 253  12/28/2018   UIBC 154 12/28/2018   IRONPCTSAT 39 12/28/2018   Lab Results  Component Value Date   RBC 4.29 07/04/2021   Lab Results  Component Value Date   KPAFRELGTCHN 47.3 (H) 04/30/2021   LAMBDASER 1.8 (L) 04/30/2021   KAPLAMBRATIO 26.28 (H) 04/30/2021   Lab Results  Component Value Date   IGGSERUM 686 04/30/2021   IGA 7 (L) 04/30/2021   IGMSERUM <5 (L) 04/30/2021  Lab Results  Component Value Date   TOTALPROTELP 5.9 (L) 04/30/2021   ALBUMINELP 3.6 04/30/2021   A1GS 0.2 04/30/2021   A2GS 0.8 04/30/2021   BETS 0.7 04/30/2021   BETA2SER 0.3 04/19/2015   GAMS 0.6 04/30/2021   MSPIKE Not Observed 04/30/2021   SPEI Comment 04/30/2021     Chemistry      Component Value Date/Time   NA 136 07/04/2021 0803   NA 142 04/23/2017 1431   NA 139 07/18/2016 1135   K 4.5 07/04/2021 0803   K 4.0 04/23/2017 1431   K 4.4 07/18/2016 1135   CL 103 07/04/2021 0803   CL 105 04/23/2017 1431   CO2 26 07/04/2021 0803   CO2 28 04/23/2017 1431   CO2 27 07/18/2016 1135   BUN 33 (H) 07/04/2021 0803   BUN 20 04/23/2017 1431   BUN 12.2 07/18/2016 1135   CREATININE 1.24 07/04/2021 0803   CREATININE 1.2 04/23/2017 1431   CREATININE 0.9 07/18/2016 1135      Component Value Date/Time   CALCIUM 9.1 07/04/2021 0803   CALCIUM 9.3 04/23/2017 1431   CALCIUM 8.9 07/18/2016 1135   ALKPHOS 65 07/04/2021 0803   ALKPHOS 64 04/23/2017 1431   ALKPHOS 58 07/18/2016 1135   AST 24 07/04/2021 0803   AST 15 07/18/2016 1135   ALT 19 07/04/2021 0803   ALT 22 04/23/2017 1431   ALT 20 07/18/2016 1135   BILITOT 0.6 07/04/2021 0803   BILITOT 1.24 (H) 07/18/2016 1135      Impression and Plan: Mr. 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   He has had I think 8 lines of therapy.  He finally was able to get onto a clinical trial with Carvyti.  This clearly has worked.  I am just so happy for him.  Hopefully, this will  be a sustained response/remission.  We will go ahead with his IVIG.  We will have him come back in 6 weeks for his next treatment.  Again he just looks so good.  I think he sees Dr. Philipp Ovens back in [place] in September.   [name], MD 6/1/202311:01 AM

## 2021-11-19 ENCOUNTER — Encounter: Payer: Self-pay | Admitting: Gastroenterology

## 2021-12-12 ENCOUNTER — Encounter: Payer: Self-pay | Admitting: Hematology & Oncology

## 2021-12-12 ENCOUNTER — Inpatient Hospital Stay: Payer: Medicare Other

## 2021-12-12 ENCOUNTER — Inpatient Hospital Stay (HOSPITAL_BASED_OUTPATIENT_CLINIC_OR_DEPARTMENT_OTHER): Payer: Medicare Other | Admitting: Hematology & Oncology

## 2021-12-12 ENCOUNTER — Other Ambulatory Visit: Payer: Self-pay | Admitting: *Deleted

## 2021-12-12 ENCOUNTER — Inpatient Hospital Stay: Payer: Medicare Other | Attending: Hematology & Oncology

## 2021-12-12 VITALS — BP 142/76 | HR 58 | Temp 97.8°F | Resp 18

## 2021-12-12 VITALS — BP 157/83 | HR 68 | Temp 97.6°F | Resp 17 | Ht 72.0 in | Wt 212.0 lb

## 2021-12-12 DIAGNOSIS — C9000 Multiple myeloma not having achieved remission: Secondary | ICD-10-CM

## 2021-12-12 DIAGNOSIS — Z0000 Encounter for general adult medical examination without abnormal findings: Secondary | ICD-10-CM

## 2021-12-12 DIAGNOSIS — Z79899 Other long term (current) drug therapy: Secondary | ICD-10-CM | POA: Diagnosis not present

## 2021-12-12 DIAGNOSIS — E1022 Type 1 diabetes mellitus with diabetic chronic kidney disease: Secondary | ICD-10-CM

## 2021-12-12 DIAGNOSIS — R233 Spontaneous ecchymoses: Secondary | ICD-10-CM | POA: Diagnosis not present

## 2021-12-12 DIAGNOSIS — Z85828 Personal history of other malignant neoplasm of skin: Secondary | ICD-10-CM | POA: Insufficient documentation

## 2021-12-12 DIAGNOSIS — C9002 Multiple myeloma in relapse: Secondary | ICD-10-CM | POA: Insufficient documentation

## 2021-12-12 DIAGNOSIS — E119 Type 2 diabetes mellitus without complications: Secondary | ICD-10-CM | POA: Diagnosis not present

## 2021-12-12 DIAGNOSIS — D801 Nonfamilial hypogammaglobulinemia: Secondary | ICD-10-CM | POA: Diagnosis not present

## 2021-12-12 DIAGNOSIS — E559 Vitamin D deficiency, unspecified: Secondary | ICD-10-CM

## 2021-12-12 LAB — CMP (CANCER CENTER ONLY)
ALT: 32 U/L (ref 0–44)
AST: 31 U/L (ref 15–41)
Albumin: 4.2 g/dL (ref 3.5–5.0)
Alkaline Phosphatase: 85 U/L (ref 38–126)
Anion gap: 6 (ref 5–15)
BUN: 20 mg/dL (ref 8–23)
CO2: 24 mmol/L (ref 22–32)
Calcium: 9.2 mg/dL (ref 8.9–10.3)
Chloride: 107 mmol/L (ref 98–111)
Creatinine: 0.98 mg/dL (ref 0.61–1.24)
GFR, Estimated: >60 mL/min (ref >60)
Glucose, Bld: 208 mg/dL — ABNORMAL HIGH (ref 70–99)
Potassium: 4.2 mmol/L (ref 3.5–5.1)
Sodium: 137 mmol/L (ref 135–145)
Total Bilirubin: 0.7 mg/dL (ref 0.3–1.2)
Total Protein: 6.1 g/dL — ABNORMAL LOW (ref 6.5–8.1)

## 2021-12-12 LAB — CBC WITH DIFFERENTIAL (CANCER CENTER ONLY)
Abs Immature Granulocytes: 0.01 10*3/uL (ref 0.00–0.07)
Basophils Absolute: 0.1 10*3/uL (ref 0.0–0.1)
Basophils Relative: 1 %
Eosinophils Absolute: 0.1 10*3/uL (ref 0.0–0.5)
Eosinophils Relative: 1 %
HCT: 47.9 % (ref 39.0–52.0)
Hemoglobin: 15.8 g/dL (ref 13.0–17.0)
Immature Granulocytes: 0 %
Lymphocytes Relative: 32 %
Lymphs Abs: 2 10*3/uL (ref 0.7–4.0)
MCH: 30.4 pg (ref 26.0–34.0)
MCHC: 33 g/dL (ref 30.0–36.0)
MCV: 92.1 fL (ref 80.0–100.0)
Monocytes Absolute: 0.7 10*3/uL (ref 0.1–1.0)
Monocytes Relative: 12 %
Neutro Abs: 3.3 10*3/uL (ref 1.7–7.7)
Neutrophils Relative %: 54 %
Platelet Count: 121 10*3/uL — ABNORMAL LOW (ref 150–400)
RBC: 5.2 MIL/uL (ref 4.22–5.81)
RDW: 14.6 % (ref 11.5–15.5)
WBC Count: 6.1 10*3/uL (ref 4.0–10.5)
nRBC: 0 % (ref 0.0–0.2)

## 2021-12-12 LAB — URINALYSIS, COMPLETE (UACMP) WITH MICROSCOPIC
Bilirubin Urine: NEGATIVE
Glucose, UA: 100 mg/dL — AB
Hgb urine dipstick: NEGATIVE
Ketones, ur: NEGATIVE mg/dL
Leukocytes,Ua: NEGATIVE
Nitrite: NEGATIVE
Protein, ur: NEGATIVE mg/dL
RBC / HPF: NONE SEEN RBC/hpf (ref 0–5)
Specific Gravity, Urine: 1.025 (ref 1.005–1.030)
WBC, UA: NONE SEEN WBC/hpf (ref 0–5)
pH: 6 (ref 5.0–8.0)

## 2021-12-12 LAB — TSH: TSH: 3.707 u[IU]/mL (ref 0.350–4.500)

## 2021-12-12 LAB — LIPID PANEL
Cholesterol: 155 mg/dL (ref 0–200)
HDL: 65 mg/dL (ref >40)
LDL Cholesterol: 75 mg/dL (ref 0–99)
Total CHOL/HDL Ratio: 2.4 RATIO
Triglycerides: 74 mg/dL (ref <150)
VLDL: 15 mg/dL (ref 0–40)

## 2021-12-12 LAB — VITAMIN D 25 HYDROXY (VIT D DEFICIENCY, FRACTURES): Vit D, 25-Hydroxy: 51.66 ng/mL (ref 30–100)

## 2021-12-12 LAB — T4, FREE: Free T4: 0.78 ng/dL (ref 0.61–1.12)

## 2021-12-12 LAB — HEMOGLOBIN A1C
Hgb A1c MFr Bld: 6.6 % — ABNORMAL HIGH (ref 4.8–5.6)
Mean Plasma Glucose: 142.72 mg/dL

## 2021-12-12 LAB — LACTATE DEHYDROGENASE: LDH: 179 U/L (ref 98–192)

## 2021-12-12 MED ORDER — HEPARIN SOD (PORK) LOCK FLUSH 100 UNIT/ML IV SOLN
500.0000 [IU] | Freq: Once | INTRAVENOUS | Status: AC
  Administered 2021-12-12: 500 [IU] via INTRAVENOUS

## 2021-12-12 MED ORDER — IMMUNE GLOBULIN (HUMAN) 20 GM/200ML IV SOLN
40.0000 g | Freq: Once | INTRAVENOUS | Status: AC
  Administered 2021-12-12: 40 g via INTRAVENOUS
  Filled 2021-12-12: qty 400

## 2021-12-12 MED ORDER — SODIUM CHLORIDE 0.9% FLUSH
10.0000 mL | INTRAVENOUS | Status: DC | PRN
  Administered 2021-12-12: 10 mL via INTRAVENOUS

## 2021-12-12 MED ORDER — DIPHENHYDRAMINE HCL 25 MG PO CAPS
25.0000 mg | ORAL_CAPSULE | Freq: Once | ORAL | Status: AC
  Administered 2021-12-12: 25 mg via ORAL
  Filled 2021-12-12: qty 1

## 2021-12-12 MED ORDER — DEXTROSE 5 % IV SOLN: INTRAVENOUS | Status: DC

## 2021-12-12 MED ORDER — ACETAMINOPHEN 325 MG PO TABS
650.0000 mg | ORAL_TABLET | Freq: Once | ORAL | Status: AC
  Administered 2021-12-12: 650 mg via ORAL
  Filled 2021-12-12: qty 2

## 2021-12-12 NOTE — Patient Instructions (Signed)
[facility]  Discharge Instructions: Thank you for choosing [facility] to provide your oncology and hematology care.   If you have a lab appointment with the Cancer Center, please go directly to the Cancer Center and check in at the registration area.  Wear comfortable clothing and clothing appropriate for easy access to any Portacath or PICC line.   We strive to give you quality time with your provider. You may need to reschedule your appointment if you arrive late (15 or more minutes).  Arriving late affects you and other patients whose appointments are after yours.  Also, if you miss three or more appointments without notifying the office, you may be dismissed from the clinic at the provider's discretion.      For prescription refill requests, have your pharmacy contact our office and allow 72 hours for refills to be completed.    Today you received the following chemotherapy and/or immunotherapy agents  IVIG      To help prevent nausea and vomiting after your treatment, we encourage you to take your nausea medication as directed.  BELOW ARE SYMPTOMS THAT SHOULD BE REPORTED IMMEDIATELY: *FEVER GREATER THAN 100.4 F (38 C) OR HIGHER *CHILLS OR SWEATING *NAUSEA AND VOMITING THAT IS NOT CONTROLLED WITH YOUR NAUSEA MEDICATION *UNUSUAL SHORTNESS OF BREATH *UNUSUAL BRUISING OR BLEEDING *URINARY PROBLEMS (pain or burning when urinating, or frequent urination) *BOWEL PROBLEMS (unusual diarrhea, constipation, pain near the anus) TENDERNESS IN MOUTH AND THROAT WITH OR WITHOUT PRESENCE OF ULCERS (sore throat, sores in mouth, or a toothache) UNUSUAL RASH, SWELLING OR PAIN  UNUSUAL VAGINAL DISCHARGE OR ITCHING   Items with * indicate a potential emergency and should be followed up as soon as possible or go to the Emergency Department if any problems should occur.  Please show the CHEMOTHERAPY ALERT CARD or IMMUNOTHERAPY ALERT CARD at check-in to the  Emergency Department and triage nurse. Should you have questions after your visit or need to cancel or reschedule your appointment, please contact [facility]  [phone] and follow the prompts.  Office hours are 8:00 a.m. to 4:30 p.m. Monday - Friday. Please note that voicemails left after 4:00 p.m. may not be returned until the following business day.  We are closed weekends and major holidays. You have access to a nurse at all times for urgent questions. Please call the main number to the clinic [phone] and follow the prompts.  For any non-urgent questions, you may also contact your provider using MyChart. We now offer e-Visits for anyone 18 and older to request care online for non-urgent symptoms. For details visit [email or web address].   Also download the MyChart app! Go to the app store, search "MyChart", open the app, select [facility], and log in with your MyChart username and password.  Due to Covid, a mask is required upon entering the hospital/clinic. If you do not have a mask, one will be given to you upon arrival. For doctor visits, patients may have 1 support person aged 18 or older with them. For treatment visits, patients cannot have anyone with them due to current Covid guidelines and our immunocompromised population.  

## 2021-12-12 NOTE — Progress Notes (Signed)
Hematology and Oncology Follow Up Visit  Rickey [name] [id number] 25-Jul-1940 81 y.o. 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 Elotuzumab/Pomalidomide/decadron -every 4 weeks- start 01/12/2018 Kyprolis/Pomalidomide/Decadron -- s/p cycle #9 -- d/c pomaldomide in 01/2019 Xpovio 80 mg po q week -- start on 12/26/2019 - d/c on 04/2020 Daratumumab -started cycle 1 in January 2022 CARVYKTI --given on 07/2021 -[place] IVIG --40 g Q 6 weeks --next dose in  12/2021   Interim History:  Rickey [name] is back for follow-up.  As always, he looks fantastic.  It is now been 4 months since he had the Carvyti therapy.  He has tolerated this incredibly well.  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  He does have a large ecchymoses on the right side of his face.  He had a basal cell carcinoma removed from the right side of his face on Monday.  He has some laser treatments done on his nose.  He is try to watch his blood sugars.  They are a little on the higher side which I am not surprised with.  He has had no problems with pain.  There is no change in bowel or bladder habits.  He does go back to [facility] in [place] in October for follow-up and a bone marrow biopsy.  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  He does get IVIG.  This is every 6 weeks.    He has had no fever.  He has had no cough or shortness of breath.  He has had no leg swelling.  He has had no numbness or tingling of his hands or feet.  Overall, I would say his performance status is probably ECOG 1.    Medications:  Allergies as of 12/12/2021       Reactions   Ace Inhibitors Cough   Other reaction(s): Cough, cough         Medication List        Accurate as of December 12, 2021 12:25 PM. If you have any questions, ask your nurse or doctor.          STOP taking these medications    Darzalex 400 MG/20ML Generic drug: daratumumab Stopped by: [name], MD   INSULIN LISP & LISP PROT (HUM) [place] Stopped by: [name], MD   Vitamin K2-Vitamin D3 45-2000 MCG-UNIT Caps Stopped by: [name], MD       TAKE these medications    acyclovir 400 MG tablet Commonly known as: ZOVIRAX TAKE 1 TABLET(400 MG) BY MOUTH TWICE DAILY   amLODipine 5 MG tablet Commonly known as: NORVASC Take 5 mg by mouth daily.   aspirin EC 81 MG tablet daily.   atorvastatin 10 MG tablet Commonly known as: LIPITOR Take 1 tablet by mouth daily.   B-D INS SYR ULTRAFINE .5CC/30G 30G X 1/2" 0.5 ML Misc Generic drug: Insulin Syringe-Needle U-100 Use as directed with insulin 4-5 times a day   CALCIUM CARBONATE PO Take 220 mg by mouth 2 (two) times daily.   Cartridge Pump Misc Inject into the skin.   cycloSPORINE 0.05 % ophthalmic emulsion Commonly known as: RESTASIS Place 1 drop into both eyes 2 (two) times daily.  diphenhydramine-acetaminophen 25-500 MG Tabs tablet Commonly known as: TYLENOL PM Take 1 tablet by mouth at bedtime as needed.   fish oil-omega-3 fatty acids 1000 MG capsule Take 1 g by mouth 2 (two) times daily.   freestyle lancets Test blood sugars 3 times a day as directed.  E10.9   FREESTYLE LITE test strip Generic drug: glucose blood Test CBG's 3 times a day or as directed  E10.9   insulin lispro 100 UNIT/ML injection Commonly known as: HUMALOG Inject into the skin. Insulin pump 12/21/2018 Typically 80U daily.   irbesartan-hydrochlorothiazide 300-12.5 MG tablet Commonly known as: AVALIDE Take 1 tablet by mouth daily.   lidocaine-prilocaine cream Commonly known as: EMLA Apply 1 application topically as needed.   montelukast 10 MG tablet Commonly known as: SINGULAIR Take  1 tablet by mouth daily.   terbinafine 250 MG tablet Commonly known as: LAMISIL Take 250 mg by mouth daily.   Vitamin D 50 MCG (2000 UT) Caps Take by mouth every morning.        Allergies:  Allergies  Allergen Reactions   Ace Inhibitors Cough    Other reaction(s): Cough, cough    Past Medical History, Surgical history, Social history, and Family History were reviewed and updated.  Review of Systems: Review of Systems  Constitutional: Negative.   HENT: Negative.    Eyes: Negative.   Respiratory: Negative.    Cardiovascular: Negative.   Gastrointestinal: Negative.   Genitourinary: Negative.   Musculoskeletal: Negative.   Skin: Negative.   Neurological: Negative.   Endo/Heme/Allergies: Negative.   Psychiatric/Behavioral: Negative.       Physical Exam:  height is 6' (1.829 m) and weight is 212 lb (96.2 kg). His oral temperature is 97.6 F (36.4 C). His blood pressure is 157/83 (abnormal) and his pulse is 68. His respiration is 17 and oxygen saturation is 99%.   Wt Readings from Last 3 Encounters:  12/12/21 212 lb (96.2 kg)  10/25/21 212 lb 1.9 oz (96.2 kg)  07/04/21 199 lb (90.3 kg)    Physical Exam Vitals reviewed.  HENT:     Head: Normocephalic and atraumatic.  Eyes:     Pupils: Pupils are equal, round, and reactive to light.  Cardiovascular:     Rate and Rhythm: Normal rate and regular rhythm.     Heart sounds: Normal heart sounds.  Pulmonary:     Effort: Pulmonary effort is normal.     Breath sounds: Normal breath sounds.  Abdominal:     General: Bowel sounds are normal.     Palpations: Abdomen is soft.  Musculoskeletal:        General: No tenderness or deformity. Normal range of motion.     Cervical back: Normal range of motion.  Lymphadenopathy:     Cervical: No cervical adenopathy.  Skin:    General: Skin is warm and dry.     Findings: No erythema or rash.  Neurological:     Mental Status: He is alert and oriented to person, place, and time.   Psychiatric:        Behavior: Behavior normal.        Thought Content: Thought content normal.        Judgment: Judgment normal.   Lab Results  Component Value Date   WBC 6.1 12/12/2021   HGB 15.8 12/12/2021   HCT 47.9 12/12/2021   MCV 92.1 12/12/2021   PLT 121 (L) 12/12/2021   Lab Results  Component Value Date   FERRITIN 258 12/28/2018  IRON 98 12/28/2018   TIBC 253 12/28/2018   UIBC 154 12/28/2018   IRONPCTSAT 39 12/28/2018   Lab Results  Component Value Date   RBC 5.20 12/12/2021   Lab Results  Component Value Date   KPAFRELGTCHN 47.3 (H) 04/30/2021   LAMBDASER 1.8 (L) 04/30/2021   KAPLAMBRATIO 26.28 (H) 04/30/2021   Lab Results  Component Value Date   IGGSERUM 686 04/30/2021   IGA 7 (L) 04/30/2021   IGMSERUM <5 (L) 04/30/2021   Lab Results  Component Value Date   TOTALPROTELP 5.9 (L) 04/30/2021   ALBUMINELP 3.6 04/30/2021   A1GS 0.2 04/30/2021   A2GS 0.8 04/30/2021   BETS 0.7 04/30/2021   BETA2SER 0.3 04/19/2015   GAMS 0.6 04/30/2021   MSPIKE Not Observed 04/30/2021   SPEI Comment 04/30/2021     Chemistry      Component Value Date/Time   NA 137 12/12/2021 1147   NA 142 04/23/2017 1431   NA 139 07/18/2016 1135   K 4.2 12/12/2021 1147   K 4.0 04/23/2017 1431   K 4.4 07/18/2016 1135   CL 107 12/12/2021 1147   CL 105 04/23/2017 1431   CO2 24 12/12/2021 1147   CO2 28 04/23/2017 1431   CO2 27 07/18/2016 1135   BUN 20 12/12/2021 1147   BUN 20 04/23/2017 1431   BUN 12.2 07/18/2016 1135   CREATININE 0.98 12/12/2021 1147   CREATININE 1.2 04/23/2017 1431   CREATININE 0.9 07/18/2016 1135      Component Value Date/Time   CALCIUM 9.2 12/12/2021 1147   CALCIUM 9.3 04/23/2017 1431   CALCIUM 8.9 07/18/2016 1135   ALKPHOS 85 12/12/2021 1147   ALKPHOS 64 04/23/2017 1431   ALKPHOS 58 07/18/2016 1135   AST 31 12/12/2021 1147   AST 15 07/18/2016 1135   ALT 32 12/12/2021 1147   ALT 22 04/23/2017 1431   ALT 20 07/18/2016 1135   BILITOT 0.7 12/12/2021  1147   BILITOT 1.24 (H) 07/18/2016 1135      Impression and Plan: Mr. Washko is a very pleasant 81 yo caucasian gentleman with 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   Again, he had the Carvyti therapy.  He did incredibly well with this.  Again I do not see any side effects from this.  We will see what his myeloma studies look like.  It is the Kappa light chain that will show [place] if there is a problem.  I am glad that he got through the basal cell carcinoma treatment.  He had Mohs surgery for this.  He will get his IVIG today.  We will plan to get him back in 6 more weeks for his next dose of IVIG.   [name], MD 7/19/202312:25 PM

## 2021-12-13 LAB — IGG, IGA, IGM
IgA: <5 mg/dL — ABNORMAL LOW (ref 61–437)
IgG (Immunoglobin G), Serum: 528 mg/dL — ABNORMAL LOW (ref 603–1613)
IgM (Immunoglobulin M), Srm: 38 mg/dL (ref 15–143)

## 2021-12-13 LAB — PSA, TOTAL AND FREE
PSA, Free Pct: 23 %
PSA, Free: 1.47 ng/mL
Prostate Specific Ag, Serum: 6.4 ng/mL — ABNORMAL HIGH (ref 0.0–4.0)

## 2021-12-13 LAB — KAPPA/LAMBDA LIGHT CHAINS
Kappa free light chain: 1.2 mg/L — ABNORMAL LOW (ref 3.3–19.4)
Kappa, lambda light chain ratio: >0.8 (ref 0.26–1.65)
Lambda free light chains: <1.5 mg/L — ABNORMAL LOW (ref 5.7–26.3)

## 2021-12-13 LAB — BETA 2 MICROGLOBULIN, SERUM: Beta-2 Microglobulin: 2.1 mg/L (ref 0.6–2.4)

## 2021-12-14 ENCOUNTER — Encounter: Payer: Self-pay | Admitting: *Deleted

## 2021-12-14 LAB — PROTEIN ELECTROPHORESIS, SERUM, WITH REFLEX
A/G Ratio: 1.5 (ref 0.7–1.7)
Albumin ELP: 3.4 g/dL (ref 2.9–4.4)
Alpha-1-Globulin: 0.2 g/dL (ref 0.0–0.4)
Alpha-2-Globulin: 0.7 g/dL (ref 0.4–1.0)
Beta Globulin: 0.8 g/dL (ref 0.7–1.3)
Gamma Globulin: 0.5 g/dL (ref 0.4–1.8)
Globulin, Total: 2.3 g/dL (ref 2.2–3.9)
Total Protein ELP: 5.7 g/dL — ABNORMAL LOW (ref 6.0–8.5)

## 2022-01-21 ENCOUNTER — Inpatient Hospital Stay: Payer: Medicare Other

## 2022-01-21 ENCOUNTER — Encounter: Payer: Self-pay | Admitting: Hematology & Oncology

## 2022-01-21 ENCOUNTER — Inpatient Hospital Stay: Payer: Medicare Other | Attending: Hematology & Oncology | Admitting: Hematology & Oncology

## 2022-01-21 ENCOUNTER — Other Ambulatory Visit: Payer: Self-pay

## 2022-01-21 VITALS — BP 163/90 | HR 70

## 2022-01-21 VITALS — BP 149/78 | HR 73 | Temp 98.0°F | Resp 17 | Ht 72.0 in | Wt 213.0 lb

## 2022-01-21 DIAGNOSIS — E119 Type 2 diabetes mellitus without complications: Secondary | ICD-10-CM | POA: Insufficient documentation

## 2022-01-21 DIAGNOSIS — C9000 Multiple myeloma not having achieved remission: Secondary | ICD-10-CM

## 2022-01-21 DIAGNOSIS — C9002 Multiple myeloma in relapse: Secondary | ICD-10-CM | POA: Insufficient documentation

## 2022-01-21 DIAGNOSIS — D801 Nonfamilial hypogammaglobulinemia: Secondary | ICD-10-CM

## 2022-01-21 LAB — CBC WITH DIFFERENTIAL (CANCER CENTER ONLY)
Abs Immature Granulocytes: 0 10*3/uL (ref 0.00–0.07)
Basophils Absolute: 0 10*3/uL (ref 0.0–0.1)
Basophils Relative: 1 %
Eosinophils Absolute: 0.1 10*3/uL (ref 0.0–0.5)
Eosinophils Relative: 1 %
HCT: 46.6 % (ref 39.0–52.0)
Hemoglobin: 15.2 g/dL (ref 13.0–17.0)
Immature Granulocytes: 0 %
Lymphocytes Relative: 33 %
Lymphs Abs: 1.3 10*3/uL (ref 0.7–4.0)
MCH: 30.4 pg (ref 26.0–34.0)
MCHC: 32.6 g/dL (ref 30.0–36.0)
MCV: 93.2 fL (ref 80.0–100.0)
Monocytes Absolute: 0.5 10*3/uL (ref 0.1–1.0)
Monocytes Relative: 13 %
Neutro Abs: 2.1 10*3/uL (ref 1.7–7.7)
Neutrophils Relative %: 52 %
Platelet Count: 109 10*3/uL — ABNORMAL LOW (ref 150–400)
RBC: 5 MIL/uL (ref 4.22–5.81)
RDW: 15.4 % (ref 11.5–15.5)
WBC Count: 4 10*3/uL (ref 4.0–10.5)
nRBC: 0 % (ref 0.0–0.2)

## 2022-01-21 LAB — CMP (CANCER CENTER ONLY)
ALT: 33 U/L (ref 0–44)
AST: 35 U/L (ref 15–41)
Albumin: 3.8 g/dL (ref 3.5–5.0)
Alkaline Phosphatase: 85 U/L (ref 38–126)
Anion gap: 7 (ref 5–15)
BUN: 21 mg/dL (ref 8–23)
CO2: 23 mmol/L (ref 22–32)
Calcium: 9.1 mg/dL (ref 8.9–10.3)
Chloride: 108 mmol/L (ref 98–111)
Creatinine: 1.17 mg/dL (ref 0.61–1.24)
GFR, Estimated: >60 mL/min (ref >60)
Glucose, Bld: 194 mg/dL — ABNORMAL HIGH (ref 70–99)
Potassium: 4.1 mmol/L (ref 3.5–5.1)
Sodium: 138 mmol/L (ref 135–145)
Total Bilirubin: 0.6 mg/dL (ref 0.3–1.2)
Total Protein: 6.1 g/dL — ABNORMAL LOW (ref 6.5–8.1)

## 2022-01-21 LAB — LACTATE DEHYDROGENASE: LDH: 162 U/L (ref 98–192)

## 2022-01-21 MED ORDER — HEPARIN SOD (PORK) LOCK FLUSH 100 UNIT/ML IV SOLN
500.0000 [IU] | Freq: Once | INTRAVENOUS | Status: AC
  Administered 2022-01-21: 500 [IU] via INTRAVENOUS

## 2022-01-21 MED ORDER — DIPHENHYDRAMINE HCL 25 MG PO CAPS: 25.0000 mg | ORAL_CAPSULE | Freq: Once | ORAL | Status: DC

## 2022-01-21 MED ORDER — ACETAMINOPHEN 325 MG PO TABS: 650.0000 mg | ORAL_TABLET | Freq: Once | ORAL | Status: DC

## 2022-01-21 MED ORDER — DEXTROSE 5 % IV SOLN: INTRAVENOUS | Status: DC

## 2022-01-21 MED ORDER — IMMUNE GLOBULIN (HUMAN) 20 GM/200ML IV SOLN
40.0000 g | Freq: Once | INTRAVENOUS | Status: AC
  Administered 2022-01-21: 40 g via INTRAVENOUS
  Filled 2022-01-21: qty 400

## 2022-01-21 MED ORDER — SODIUM CHLORIDE 0.9% FLUSH
10.0000 mL | Freq: Once | INTRAVENOUS | Status: AC
  Administered 2022-01-21: 10 mL via INTRAVENOUS

## 2022-01-21 NOTE — Patient Instructions (Signed)

## 2022-01-21 NOTE — Progress Notes (Signed)
Hematology and Oncology Follow Up Visit  Rickey [name] [id number] 11/28/1940 81 y.o. 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 Elotuzumab/Pomalidomide/decadron -every 4 weeks- start 01/12/2018 Kyprolis/Pomalidomide/Decadron -- s/p cycle #9 -- d/c pomaldomide in 01/2019 Xpovio 80 mg po q week -- start on 12/26/2019 - d/c on 04/2020 Daratumumab -started cycle 1 in January 2022 CARVYKTI --given on 07/2021 -[place] IVIG --40 g Q 6 weeks --next dose in  12/2021   Interim History:  Rickey [name] is back for follow-up.  He is doing quite well.  He will has had no complaints since we last saw him.  He and his wife are but they are not home.  They have been enjoying it up there.  They have had quite a bit of rain.  He goes back in October to see Dr. Philipp Ovens at [facility].  He will have a bone marrow biopsy done then.  We last saw him in July, his Kappa light chain was 0.1 mg/dL.  His diabetes is doing pretty well.  He has a continuous glucose monitor.  He has had no fever.  He has had no cough or shortness of breath.  He has had no change in bowel or bladder habits.  He has had no nausea or vomiting.  He has had no rashes.  Is been no leg swelling.  Overall, I was his performance status is probably ECOG 0.      Medications:  Allergies as of 01/21/2022       Reactions   Ace Inhibitors Cough   Other reaction(s): Cough, cough        Medication List        Accurate as of January 21, 2022  9:27 AM. If you have any questions, ask your nurse or doctor.          acyclovir 400 MG tablet Commonly known as: ZOVIRAX TAKE 1 TABLET(400 MG) BY MOUTH TWICE DAILY   ALPRAZolam 0.25 MG tablet Commonly known as:  XANAX Take 0.25 mg by mouth at bedtime as needed.   amLODipine 5 MG tablet Commonly known as: NORVASC Take 5 mg by mouth daily.   aspirin EC 81 MG tablet daily.   atorvastatin 10 MG tablet Commonly known as: LIPITOR Take 1 tablet by mouth daily.   B-D INS SYR ULTRAFINE .5CC/30G 30G X 1/2" 0.5 ML Misc Generic drug: Insulin Syringe-Needle U-100   CALCIUM CARBONATE PO Take 220 mg by mouth 2 (two) times daily.   Cartridge Pump Misc Inject into the skin.   cycloSPORINE 0.05 % ophthalmic emulsion Commonly known as: RESTASIS Place 1 drop into both eyes 2 (two) times daily.   diphenhydramine-acetaminophen 25-500 MG Tabs tablet Commonly known as: TYLENOL PM Take 1 tablet by mouth at bedtime as needed.   fish oil-omega-3 fatty acids 1000 MG capsule Take 1 g by mouth 2 (two) times daily.   freestyle lancets   FREESTYLE LITE test strip Generic drug: glucose blood Test CBG's 3 times a day or as directed  E10.9   insulin lispro 100 UNIT/ML injection Commonly known as: HUMALOG Inject into the skin. Insulin pump 12/21/2018 Typically 80U daily.   irbesartan-hydrochlorothiazide 300-12.5 MG tablet Commonly known as: AVALIDE Take 1 tablet by mouth daily.  lidocaine-prilocaine cream Commonly known as: EMLA Apply 1 application topically as needed.   montelukast 10 MG tablet Commonly known as: SINGULAIR Take 1 tablet by mouth daily.   terbinafine 250 MG tablet Commonly known as: LAMISIL Take 250 mg by mouth daily.   Vitamin D 50 MCG (2000 UT) Caps Take by mouth every morning.        Allergies:  Allergies  Allergen Reactions   Ace Inhibitors Cough    Other reaction(s): Cough, cough    Past Medical History, Surgical history, Social history, and Family History were reviewed and updated.  Review of Systems: Review of Systems  Constitutional: Negative.   HENT: Negative.    Eyes: Negative.   Respiratory: Negative.    Cardiovascular: Negative.   Gastrointestinal:  Negative.   Genitourinary: Negative.   Musculoskeletal: Negative.   Skin: Negative.   Neurological: Negative.   Endo/Heme/Allergies: Negative.   Psychiatric/Behavioral: Negative.       Physical Exam:  height is 6' (1.829 m) and weight is 213 lb (96.6 kg). His oral temperature is 98 F (36.7 C). His blood pressure is 149/78 (abnormal) and his pulse is 73. His respiration is 17 and oxygen saturation is 100%.   Wt Readings from Last 3 Encounters:  01/21/22 213 lb (96.6 kg)  12/12/21 212 lb (96.2 kg)  10/25/21 212 lb 1.9 oz (96.2 kg)    Physical Exam Vitals reviewed.  HENT:     Head: Normocephalic and atraumatic.  Eyes:     Pupils: Pupils are equal, round, and reactive to light.  Cardiovascular:     Rate and Rhythm: Normal rate and regular rhythm.     Heart sounds: Normal heart sounds.  Pulmonary:     Effort: Pulmonary effort is normal.     Breath sounds: Normal breath sounds.  Abdominal:     General: Bowel sounds are normal.     Palpations: Abdomen is soft.  Musculoskeletal:        General: No tenderness or deformity. Normal range of motion.     Cervical back: Normal range of motion.  Lymphadenopathy:     Cervical: No cervical adenopathy.  Skin:    General: Skin is warm and dry.     Findings: No erythema or rash.  Neurological:     Mental Status: He is alert and oriented to person, place, and time.  Psychiatric:        Behavior: Behavior normal.        Thought Content: Thought content normal.        Judgment: Judgment normal.   Lab Results  Component Value Date   WBC 4.0 01/21/2022   HGB 15.2 01/21/2022   HCT 46.6 01/21/2022   MCV 93.2 01/21/2022   PLT 109 (L) 01/21/2022   Lab Results  Component Value Date   FERRITIN 258 12/28/2018   IRON 98 12/28/2018   TIBC 253 12/28/2018   UIBC 154 12/28/2018   IRONPCTSAT 39 12/28/2018   Lab Results  Component Value Date   RBC 5.00 01/21/2022   Lab Results  Component Value Date   KPAFRELGTCHN 1.2 (L) 12/12/2021    LAMBDASER <1.5 (L) 12/12/2021   KAPLAMBRATIO >0.80 12/12/2021   Lab Results  Component Value Date   IGGSERUM 528 (L) 12/12/2021   IGA <5 (L) 12/12/2021   IGMSERUM 38 12/12/2021   Lab Results  Component Value Date   TOTALPROTELP 5.7 (L) 12/12/2021   ALBUMINELP 3.4 12/12/2021   A1GS 0.2 12/12/2021   A2GS 0.7 12/12/2021  BETS 0.8 12/12/2021   BETA2SER 0.3 04/19/2015   GAMS 0.5 12/12/2021   MSPIKE Not Observed 12/12/2021   SPEI Comment 04/30/2021     Chemistry      Component Value Date/Time   NA 137 12/12/2021 1147   NA 142 04/23/2017 1431   NA 139 07/18/2016 1135   K 4.2 12/12/2021 1147   K 4.0 04/23/2017 1431   K 4.4 07/18/2016 1135   CL 107 12/12/2021 1147   CL 105 04/23/2017 1431   CO2 24 12/12/2021 1147   CO2 28 04/23/2017 1431   CO2 27 07/18/2016 1135   BUN 20 12/12/2021 1147   BUN 20 04/23/2017 1431   BUN 12.2 07/18/2016 1135   CREATININE 0.98 12/12/2021 1147   CREATININE 1.2 04/23/2017 1431   CREATININE 0.9 07/18/2016 1135      Component Value Date/Time   CALCIUM 9.2 12/12/2021 1147   CALCIUM 9.3 04/23/2017 1431   CALCIUM 8.9 07/18/2016 1135   ALKPHOS 85 12/12/2021 1147   ALKPHOS 64 04/23/2017 1431   ALKPHOS 58 07/18/2016 1135   AST 31 12/12/2021 1147   AST 15 07/18/2016 1135   ALT 32 12/12/2021 1147   ALT 22 04/23/2017 1431   ALT 20 07/18/2016 1135   BILITOT 0.7 12/12/2021 1147   BILITOT 1.24 (H) 07/18/2016 1135      Impression and Plan: Rickey [name] is a very pleasant 81 yo caucasian gentleman with 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   Again, he had the Carvyti therapy.  This was done back in March.  Again, he will go in October to have another evaluation with a bone marrow test.  His IgG level when we last saw him was 520 mg/dL.  This is doing pretty well.  We will go ahead with another dose today.  I noted that his platelet count is a little bit lower.  I am not sure why this would be.  I think this is some  that we will just have to follow.  We will plan to see him back in 6 weeks.   [name], MD 8/28/20239:27 AM

## 2022-01-22 ENCOUNTER — Other Ambulatory Visit: Payer: Medicare Other

## 2022-01-22 ENCOUNTER — Inpatient Hospital Stay: Payer: Medicare Other

## 2022-01-22 ENCOUNTER — Ambulatory Visit: Payer: Medicare Other

## 2022-01-22 ENCOUNTER — Ambulatory Visit: Payer: Medicare Other | Admitting: Hematology & Oncology

## 2022-01-22 LAB — IGG, IGA, IGM
IgA: <5 mg/dL — ABNORMAL LOW (ref 61–437)
IgG (Immunoglobin G), Serum: 582 mg/dL — ABNORMAL LOW (ref 603–1613)
IgM (Immunoglobulin M), Srm: 13 mg/dL — ABNORMAL LOW (ref 15–143)

## 2022-01-22 LAB — KAPPA/LAMBDA LIGHT CHAINS
Kappa free light chain: 1.7 mg/L — ABNORMAL LOW (ref 3.3–19.4)
Kappa, lambda light chain ratio: >1.13 (ref 0.26–1.65)
Lambda free light chains: <1.5 mg/L — ABNORMAL LOW (ref 5.7–26.3)

## 2022-01-22 NOTE — Addendum Note (Signed)
Addended by: [name] on: 01/22/2022 07:57 PM   Modules accepted: Orders

## 2022-01-24 LAB — PROTEIN ELECTROPHORESIS, SERUM, WITH REFLEX
A/G Ratio: 1.4 (ref 0.7–1.7)
Albumin ELP: 3.3 g/dL (ref 2.9–4.4)
Alpha-1-Globulin: 0.2 g/dL (ref 0.0–0.4)
Alpha-2-Globulin: 0.7 g/dL (ref 0.4–1.0)
Beta Globulin: 0.8 g/dL (ref 0.7–1.3)
Gamma Globulin: 0.5 g/dL (ref 0.4–1.8)
Globulin, Total: 2.3 g/dL (ref 2.2–3.9)
Total Protein ELP: 5.6 g/dL — ABNORMAL LOW (ref 6.0–8.5)

## 2022-02-08 ENCOUNTER — Other Ambulatory Visit: Payer: Self-pay | Admitting: Hematology & Oncology

## 2022-03-06 ENCOUNTER — Inpatient Hospital Stay: Payer: Medicare Other

## 2022-03-06 ENCOUNTER — Encounter: Payer: Self-pay | Admitting: Hematology & Oncology

## 2022-03-06 ENCOUNTER — Other Ambulatory Visit: Payer: Self-pay

## 2022-03-06 ENCOUNTER — Inpatient Hospital Stay: Payer: Medicare Other | Attending: Hematology & Oncology

## 2022-03-06 ENCOUNTER — Inpatient Hospital Stay (HOSPITAL_BASED_OUTPATIENT_CLINIC_OR_DEPARTMENT_OTHER): Payer: Medicare Other | Admitting: Hematology & Oncology

## 2022-03-06 VITALS — BP 146/72 | HR 60

## 2022-03-06 VITALS — BP 122/70 | HR 70 | Temp 97.8°F | Resp 17 | Ht 72.0 in | Wt 213.0 lb

## 2022-03-06 DIAGNOSIS — C9000 Multiple myeloma not having achieved remission: Secondary | ICD-10-CM

## 2022-03-06 DIAGNOSIS — C9001 Multiple myeloma in remission: Secondary | ICD-10-CM | POA: Diagnosis not present

## 2022-03-06 DIAGNOSIS — C9002 Multiple myeloma in relapse: Secondary | ICD-10-CM | POA: Insufficient documentation

## 2022-03-06 DIAGNOSIS — Z95828 Presence of other vascular implants and grafts: Secondary | ICD-10-CM

## 2022-03-06 MED ORDER — SODIUM CHLORIDE 0.9% FLUSH
10.0000 mL | INTRAVENOUS | Status: DC | PRN
  Administered 2022-03-06: 10 mL via INTRAVENOUS

## 2022-03-06 MED ORDER — DIPHENHYDRAMINE HCL 25 MG PO CAPS: 25.0000 mg | ORAL_CAPSULE | Freq: Once | ORAL | Status: DC

## 2022-03-06 MED ORDER — SODIUM CHLORIDE 0.9% FLUSH
10.0000 mL | Freq: Once | INTRAVENOUS | Status: AC
  Administered 2022-03-06: 10 mL via INTRAVENOUS

## 2022-03-06 MED ORDER — ACETAMINOPHEN 325 MG PO TABS: 650.0000 mg | ORAL_TABLET | Freq: Once | ORAL | Status: DC

## 2022-03-06 MED ORDER — DEXTROSE 5 % IV SOLN: INTRAVENOUS | Status: DC

## 2022-03-06 MED ORDER — HEPARIN SOD (PORK) LOCK FLUSH 100 UNIT/ML IV SOLN
500.0000 [IU] | Freq: Once | INTRAVENOUS | Status: AC
  Administered 2022-03-06: 500 [IU] via INTRAVENOUS

## 2022-03-06 MED ORDER — IMMUNE GLOBULIN (HUMAN) 20 GM/200ML IV SOLN
40.0000 g | Freq: Once | INTRAVENOUS | Status: AC
  Administered 2022-03-06: 40 g via INTRAVENOUS
  Filled 2022-03-06: qty 400

## 2022-03-06 NOTE — Progress Notes (Signed)
Verbal order Per MD:  Labs done in [place] 10/2- ok to treat with labs from 10/2. No additional labs to be drawn today.

## 2022-03-06 NOTE — Progress Notes (Signed)
Ok to treat with IVIG today with labs done 02/25/2022 at Dr Philipp Ovens office in [place] with [facility].

## 2022-03-06 NOTE — Progress Notes (Signed)
Hematology and Oncology Follow Up Visit  Rickey [name] [id number] July 04, 1940 81 y.o. 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 Elotuzumab/Pomalidomide/decadron -every 4 weeks- start 01/12/2018 Kyprolis/Pomalidomide/Decadron -- s/p cycle #9 -- d/c pomaldomide in 01/2019 Xpovio 80 mg po q week -- start on 12/26/2019 - d/c on 04/2020 Daratumumab -started cycle 1 in January 2022 CARVYKTI --given on 07/2021 -[facility] IVIG --40 g Q 6 weeks --next dose in  04/17/2022   Interim History:  [name] is back for follow-up.  He did have a bone marrow biopsy done down in [place].  This was done on 02/25/2022.  The pathology report from [facility] shows that he has no myeloma noted in the bone marrow.  No monoclonal plasma cell population identified by flow cytometry.  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  He has done incredibly well.  He feels good.  Has little bit of stiffness in the right shoulder.  He is on IVIG right now.  His last IgG level was 582 mg/dL.  He has had no problems with cough or shortness of breath.  He has had no nausea or vomiting.  He has had no rashes.  He has had no issues with his blood sugars.  He is trying to watch these very carefully.  Currently, I was his performance status is probably ECOG 0.      Medications:  Allergies as of 03/06/2022       Reactions   Ace Inhibitors Cough   Other reaction(s): Cough, cough        Medication List        Accurate as of March 06, 2022 12:00 PM. If you have any questions, ask your nurse or doctor.          acyclovir 400 MG tablet Commonly known as: ZOVIRAX TAKE 1 TABLET BY MOUTH TWICE DAILY   ALPRAZolam 0.25 MG tablet Commonly known as:  XANAX Take 0.25 mg by mouth at bedtime as needed.   amLODipine 5 MG tablet Commonly known as: NORVASC Take 5 mg by mouth daily.   aspirin EC 81 MG tablet daily.   atorvastatin 10 MG tablet Commonly known as: LIPITOR Take 1 tablet by mouth daily.   B-D INS SYR ULTRAFINE .5CC/30G 30G X 1/2" 0.5 ML Misc Generic drug: Insulin Syringe-Needle U-100   CALCIUM CARBONATE PO Take 220 mg by mouth 2 (two) times daily.   Cartridge Pump Misc Inject into the skin.   cycloSPORINE 0.05 % ophthalmic emulsion Commonly known as: RESTASIS Place 1 drop into both eyes 2 (two) times daily.   diphenhydramine-acetaminophen 25-500 MG Tabs tablet Commonly known as: TYLENOL PM Take 1 tablet by mouth at bedtime as needed.   fish oil-omega-3 fatty acids 1000 MG capsule Take 1 g by mouth 2 (two) times daily.   freestyle lancets   FREESTYLE LITE test strip Generic drug: glucose blood Test CBG's 3 times a day or as directed  E10.9   insulin lispro 100 UNIT/ML injection Commonly known as: HUMALOG Inject into the skin. Insulin pump 12/21/2018 Typically 80U daily.   irbesartan-hydrochlorothiazide 300-12.5 MG tablet Commonly known as: AVALIDE Take 1 tablet by mouth daily.   lidocaine-prilocaine cream Commonly known as: EMLA Apply 1 application topically  as needed.   montelukast 10 MG tablet Commonly known as: SINGULAIR Take 1 tablet by mouth daily.   terbinafine 250 MG tablet Commonly known as: LAMISIL Take 250 mg by mouth daily.   Vitamin D 50 MCG (2000 UT) Caps Take by mouth every morning.        Allergies:  Allergies  Allergen Reactions   Ace Inhibitors Cough    Other reaction(s): Cough, cough    Past Medical History, Surgical history, Social history, and Family History were reviewed and updated.  Review of Systems: Review of Systems  Constitutional: Negative.   HENT: Negative.    Eyes: Negative.   Respiratory: Negative.    Cardiovascular: Negative.   Gastrointestinal:  Negative.   Genitourinary: Negative.   Musculoskeletal: Negative.   Skin: Negative.   Neurological: Negative.   Endo/Heme/Allergies: Negative.   Psychiatric/Behavioral: Negative.       Physical Exam:  height is 6' (1.829 m) and weight is 213 lb (96.6 kg). His oral temperature is 97.8 F (36.6 C). His blood pressure is 122/70 and his pulse is 70. His respiration is 17 and oxygen saturation is 99%.   Wt Readings from Last 3 Encounters:  03/06/22 213 lb (96.6 kg)  01/21/22 213 lb (96.6 kg)  12/12/21 212 lb (96.2 kg)    Physical Exam Vitals reviewed.  HENT:     Head: Normocephalic and atraumatic.  Eyes:     Pupils: Pupils are equal, round, and reactive to light.  Cardiovascular:     Rate and Rhythm: Normal rate and regular rhythm.     Heart sounds: Normal heart sounds.  Pulmonary:     Effort: Pulmonary effort is normal.     Breath sounds: Normal breath sounds.  Abdominal:     General: Bowel sounds are normal.     Palpations: Abdomen is soft.  Musculoskeletal:        General: No tenderness or deformity. Normal range of motion.     Cervical back: Normal range of motion.  Lymphadenopathy:     Cervical: No cervical adenopathy.  Skin:    General: Skin is warm and dry.     Findings: No erythema or rash.  Neurological:     Mental Status: He is alert and oriented to person, place, and time.  Psychiatric:        Behavior: Behavior normal.        Thought Content: Thought content normal.        Judgment: Judgment normal.    Lab Results  Component Value Date   WBC 4.0 01/21/2022   HGB 15.2 01/21/2022   HCT 46.6 01/21/2022   MCV 93.2 01/21/2022   PLT 109 (L) 01/21/2022   Lab Results  Component Value Date   FERRITIN 258 12/28/2018   IRON 98 12/28/2018   TIBC 253 12/28/2018   UIBC 154 12/28/2018   IRONPCTSAT 39 12/28/2018   Lab Results  Component Value Date   RBC 5.00 01/21/2022   Lab Results  Component Value Date   KPAFRELGTCHN 1.7 (L) 01/21/2022   LAMBDASER  <1.5 (L) 01/21/2022   KAPLAMBRATIO >1.13 01/21/2022   Lab Results  Component Value Date   IGGSERUM 582 (L) 01/21/2022   IGA <5 (L) 01/21/2022   IGMSERUM 13 (L) 01/21/2022   Lab Results  Component Value Date   TOTALPROTELP 5.6 (L) 01/21/2022   ALBUMINELP 3.3 01/21/2022   A1GS 0.2 01/21/2022   A2GS 0.7 01/21/2022   BETS 0.8 01/21/2022   BETA2SER 0.3 04/19/2015   GAMS  0.5 01/21/2022   MSPIKE Not Observed 01/21/2022   SPEI Comment 04/30/2021     Chemistry      Component Value Date/Time   NA 138 01/21/2022 0849   NA 142 04/23/2017 1431   NA 139 07/18/2016 1135   K 4.1 01/21/2022 0849   K 4.0 04/23/2017 1431   K 4.4 07/18/2016 1135   CL 108 01/21/2022 0849   CL 105 04/23/2017 1431   CO2 23 01/21/2022 0849   CO2 28 04/23/2017 1431   CO2 27 07/18/2016 1135   BUN 21 01/21/2022 0849   BUN 20 04/23/2017 1431   BUN 12.2 07/18/2016 1135   CREATININE 1.17 01/21/2022 0849   CREATININE 1.2 04/23/2017 1431   CREATININE 0.9 07/18/2016 1135      Component Value Date/Time   CALCIUM 9.1 01/21/2022 0849   CALCIUM 9.3 04/23/2017 1431   CALCIUM 8.9 07/18/2016 1135   ALKPHOS 85 01/21/2022 0849   ALKPHOS 64 04/23/2017 1431   ALKPHOS 58 07/18/2016 1135   AST 35 01/21/2022 0849   AST 15 07/18/2016 1135   ALT 33 01/21/2022 0849   ALT 22 04/23/2017 1431   ALT 20 07/18/2016 1135   BILITOT 0.6 01/21/2022 0849   BILITOT 1.24 (H) 07/18/2016 1135      Impression and Plan: Mr. 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   Again, he had the Carvyti therapy.  This was done back in March,2023.  His recent bone marrow test shows that he was minimal residual disease negative.  I do so happy that he has done this well.  He has had myeloma now for about 13 years.  He has done remarkably well.  We will now plan for him to go to his beach house for the winter.  He gets his care down in [place].  I know he goes to  the cancer center down there and enjoys the doctor who takes care of him.  We will see him back before Thanksgiving.    [name], MD 10/11/202312:00 PM

## 2022-03-06 NOTE — Patient Instructions (Signed)

## 2022-04-17 ENCOUNTER — Inpatient Hospital Stay (HOSPITAL_BASED_OUTPATIENT_CLINIC_OR_DEPARTMENT_OTHER): Payer: Medicare Other | Admitting: Hematology & Oncology

## 2022-04-17 ENCOUNTER — Encounter: Payer: Self-pay | Admitting: Hematology & Oncology

## 2022-04-17 ENCOUNTER — Inpatient Hospital Stay: Payer: Medicare Other

## 2022-04-17 ENCOUNTER — Other Ambulatory Visit: Payer: Self-pay

## 2022-04-17 ENCOUNTER — Inpatient Hospital Stay: Payer: Medicare Other | Attending: Hematology & Oncology

## 2022-04-17 VITALS — BP 122/79 | HR 84 | Temp 97.5°F | Resp 18 | Ht 72.0 in | Wt 204.0 lb

## 2022-04-17 VITALS — BP 139/69 | HR 73 | Temp 97.6°F | Resp 17

## 2022-04-17 DIAGNOSIS — C9002 Multiple myeloma in relapse: Secondary | ICD-10-CM | POA: Diagnosis not present

## 2022-04-17 DIAGNOSIS — C9001 Multiple myeloma in remission: Secondary | ICD-10-CM

## 2022-04-17 DIAGNOSIS — C9000 Multiple myeloma not having achieved remission: Secondary | ICD-10-CM

## 2022-04-17 LAB — CBC WITH DIFFERENTIAL (CANCER CENTER ONLY)
Abs Immature Granulocytes: 0.01 10*3/uL (ref 0.00–0.07)
Basophils Absolute: 0.1 10*3/uL (ref 0.0–0.1)
Basophils Relative: 1 %
Eosinophils Absolute: 0.1 10*3/uL (ref 0.0–0.5)
Eosinophils Relative: 2 %
HCT: 49.6 % (ref 39.0–52.0)
Hemoglobin: 16.7 g/dL (ref 13.0–17.0)
Immature Granulocytes: 0 %
Lymphocytes Relative: 28 %
Lymphs Abs: 1.5 10*3/uL (ref 0.7–4.0)
MCH: 31.6 pg (ref 26.0–34.0)
MCHC: 33.7 g/dL (ref 30.0–36.0)
MCV: 93.9 fL (ref 80.0–100.0)
Monocytes Absolute: 0.6 10*3/uL (ref 0.1–1.0)
Monocytes Relative: 12 %
Neutro Abs: 3.1 10*3/uL (ref 1.7–7.7)
Neutrophils Relative %: 57 %
Platelet Count: 144 10*3/uL — ABNORMAL LOW (ref 150–400)
RBC: 5.28 MIL/uL (ref 4.22–5.81)
RDW: 15.9 % — ABNORMAL HIGH (ref 11.5–15.5)
WBC Count: 5.4 10*3/uL (ref 4.0–10.5)
nRBC: 0 % (ref 0.0–0.2)

## 2022-04-17 LAB — CMP (CANCER CENTER ONLY)
ALT: 38 U/L (ref 0–44)
AST: 36 U/L (ref 15–41)
Albumin: 4.1 g/dL (ref 3.5–5.0)
Alkaline Phosphatase: 82 U/L (ref 38–126)
Anion gap: 8 (ref 5–15)
BUN: 25 mg/dL — ABNORMAL HIGH (ref 8–23)
CO2: 24 mmol/L (ref 22–32)
Calcium: 9.5 mg/dL (ref 8.9–10.3)
Chloride: 106 mmol/L (ref 98–111)
Creatinine: 1.24 mg/dL (ref 0.61–1.24)
GFR, Estimated: 59 mL/min — ABNORMAL LOW (ref >60)
Glucose, Bld: 151 mg/dL — ABNORMAL HIGH (ref 70–99)
Potassium: 4.1 mmol/L (ref 3.5–5.1)
Sodium: 138 mmol/L (ref 135–145)
Total Bilirubin: 0.8 mg/dL (ref 0.3–1.2)
Total Protein: 6.2 g/dL — ABNORMAL LOW (ref 6.5–8.1)

## 2022-04-17 LAB — LACTATE DEHYDROGENASE: LDH: 149 U/L (ref 98–192)

## 2022-04-17 MED ORDER — ACETAMINOPHEN 325 MG PO TABS: 650.0000 mg | ORAL_TABLET | Freq: Once | ORAL | Status: DC

## 2022-04-17 MED ORDER — DIPHENHYDRAMINE HCL 25 MG PO CAPS: 25.0000 mg | ORAL_CAPSULE | Freq: Once | ORAL | Status: DC

## 2022-04-17 MED ORDER — DEXTROSE 5 % IV SOLN: INTRAVENOUS | Status: DC

## 2022-04-17 MED ORDER — IMMUNE GLOBULIN (HUMAN) 20 GM/200ML IV SOLN
40.0000 g | Freq: Once | INTRAVENOUS | Status: AC
  Administered 2022-04-17: 40 g via INTRAVENOUS
  Filled 2022-04-17: qty 400

## 2022-04-17 NOTE — Progress Notes (Signed)
Hematology and Oncology Follow Up Visit  Rickey [name] [id number] Nov 25, 1940 81 y.o. 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 Elotuzumab/Pomalidomide/decadron -every 4 weeks- start 01/12/2018 Kyprolis/Pomalidomide/Decadron -- s/p cycle #9 -- d/c pomaldomide in 01/2019 Xpovio 80 mg po q week -- start on 12/26/2019 - d/c on 04/2020 Daratumumab -started cycle 1 in January 2022 CARVYKTI --given on 07/2021 -[facility] IVIG --40 g Q 6 weeks --next dose in  04/17/2022   Interim History:  Rickey [name] is back for follow-up.  He has birthday last week.  Had a wonderful birthday.  As always, his family came in for his birthday.  He now is going to have a nice Thanksgiving with his family up at their [facility].  On Monday, he will be headed to his beach home for for 5 months.  He will spend the winter down to the beach.  He is done incredibly well.  He had the Carvyti back on 07/2021.  He has been in remission with this.  He gets his IVIG.  His last IgG level back in August was 582 mg/dL.  He has had no nausea or vomiting.  He has had no change in bowel or bladder habits.  He has had no rashes.  There is been no bleeding.  He has had no cough or shortness of breath.  He is watch his blood sugars very closely.  He has a CGM that he uses.  Overall, I would have to say that his performance status is probably ECOG 1.    Medications:  Allergies as of 04/17/2022       Reactions   Ace Inhibitors Cough, Other (See Comments)   Other reaction(s): Cough, cough        Medication List        Accurate as of April 17, 2022 10:28 AM. If you have any questions, ask your nurse or doctor.          acyclovir 400 MG  tablet Commonly known as: ZOVIRAX TAKE 1 TABLET BY MOUTH TWICE DAILY   ALPRAZolam 0.25 MG tablet Commonly known as: XANAX Take 0.25 mg by mouth at bedtime as needed.   amLODipine 5 MG tablet Commonly known as: NORVASC Take 5 mg by mouth daily.   aspirin EC 81 MG tablet daily.   atorvastatin 10 MG tablet Commonly known as: LIPITOR Take 1 tablet by mouth daily.   B-D INS SYR ULTRAFINE .5CC/30G 30G X 1/2" 0.5 ML Misc Generic drug: Insulin Syringe-Needle U-100   CALCIUM CARBONATE PO Take 220 mg by mouth 2 (two) times daily.   Cartridge Pump Misc Inject into the skin.   cycloSPORINE 0.05 % ophthalmic emulsion Commonly known as: RESTASIS Place 1 drop into both eyes 2 (two) times daily.   diphenhydramine-acetaminophen 25-500 MG Tabs tablet Commonly known as: TYLENOL PM Take 1 tablet by mouth at bedtime as needed.   fish oil-omega-3 fatty acids 1000 MG capsule Take 1 g by mouth 2 (two) times daily.   freestyle lancets   FREESTYLE LITE test strip Generic drug: glucose blood Test CBG's 3 times a day or as directed  E10.9   insulin lispro 100 UNIT/ML injection Commonly known as: HUMALOG Inject into the skin. Insulin pump 12/21/2018 Typically 80U  daily.   irbesartan-hydrochlorothiazide 300-12.5 MG tablet Commonly known as: AVALIDE Take 1 tablet by mouth daily.   lidocaine-prilocaine cream Commonly known as: EMLA Apply 1 application topically as needed.   montelukast 10 MG tablet Commonly known as: SINGULAIR Take 1 tablet by mouth daily.   terbinafine 250 MG tablet Commonly known as: LAMISIL Take 250 mg by mouth daily.   Vitamin D 50 MCG (2000 UT) Caps Take by mouth every morning.        Allergies:  Allergies  Allergen Reactions   Ace Inhibitors Cough and Other (See Comments)    Other reaction(s): Cough, cough    Past Medical History, Surgical history, Social history, and Family History were reviewed and updated.  Review of Systems: Review of  Systems  Constitutional: Negative.   HENT: Negative.    Eyes: Negative.   Respiratory: Negative.    Cardiovascular: Negative.   Gastrointestinal: Negative.   Genitourinary: Negative.   Musculoskeletal: Negative.   Skin: Negative.   Neurological: Negative.   Endo/Heme/Allergies: Negative.   Psychiatric/Behavioral: Negative.       Physical Exam:  height is 6' (1.829 m) and weight is 204 lb (92.5 kg). His oral temperature is 97.5 F (36.4 C) (abnormal). His blood pressure is 122/79 and his pulse is 84. His respiration is 18 and oxygen saturation is 98%.   Wt Readings from Last 3 Encounters:  04/17/22 204 lb (92.5 kg)  03/06/22 213 lb (96.6 kg)  01/21/22 213 lb (96.6 kg)    Physical Exam Vitals reviewed.  HENT:     Head: Normocephalic and atraumatic.  Eyes:     Pupils: Pupils are equal, round, and reactive to light.  Cardiovascular:     Rate and Rhythm: Normal rate and regular rhythm.     Heart sounds: Normal heart sounds.  Pulmonary:     Effort: Pulmonary effort is normal.     Breath sounds: Normal breath sounds.  Abdominal:     General: Bowel sounds are normal.     Palpations: Abdomen is soft.  Musculoskeletal:        General: No tenderness or deformity. Normal range of motion.     Cervical back: Normal range of motion.  Lymphadenopathy:     Cervical: No cervical adenopathy.  Skin:    General: Skin is warm and dry.     Findings: No erythema or rash.  Neurological:     Mental Status: He is alert and oriented to person, place, and time.  Psychiatric:        Behavior: Behavior normal.        Thought Content: Thought content normal.        Judgment: Judgment normal.    Lab Results  Component Value Date   WBC 5.4 04/17/2022   HGB 16.7 04/17/2022   HCT 49.6 04/17/2022   MCV 93.9 04/17/2022   PLT 144 (L) 04/17/2022   Lab Results  Component Value Date   FERRITIN 258 12/28/2018   IRON 98 12/28/2018   TIBC 253 12/28/2018   UIBC 154 12/28/2018   IRONPCTSAT 39  12/28/2018   Lab Results  Component Value Date   RBC 5.28 04/17/2022   Lab Results  Component Value Date   KPAFRELGTCHN 1.7 (L) 01/21/2022   LAMBDASER <1.5 (L) 01/21/2022   KAPLAMBRATIO >1.13 01/21/2022   Lab Results  Component Value Date   IGGSERUM 582 (L) 01/21/2022   IGA <5 (L) 01/21/2022   IGMSERUM 13 (L) 01/21/2022   Lab Results  Component Value Date  TOTALPROTELP 5.6 (L) 01/21/2022   ALBUMINELP 3.3 01/21/2022   A1GS 0.2 01/21/2022   A2GS 0.7 01/21/2022   BETS 0.8 01/21/2022   BETA2SER 0.3 04/19/2015   GAMS 0.5 01/21/2022   MSPIKE Not Observed 01/21/2022   SPEI Comment 04/30/2021     Chemistry      Component Value Date/Time   NA 138 04/17/2022 0927   NA 142 04/23/2017 1431   NA 139 07/18/2016 1135   K 4.1 04/17/2022 0927   K 4.0 04/23/2017 1431   K 4.4 07/18/2016 1135   CL 106 04/17/2022 0927   CL 105 04/23/2017 1431   CO2 24 04/17/2022 0927   CO2 28 04/23/2017 1431   CO2 27 07/18/2016 1135   BUN 25 (H) 04/17/2022 0927   BUN 20 04/23/2017 1431   BUN 12.2 07/18/2016 1135   CREATININE 1.24 04/17/2022 0927   CREATININE 1.2 04/23/2017 1431   CREATININE 0.9 07/18/2016 1135      Component Value Date/Time   CALCIUM 9.5 04/17/2022 0927   CALCIUM 9.3 04/23/2017 1431   CALCIUM 8.9 07/18/2016 1135   ALKPHOS 82 04/17/2022 0927   ALKPHOS 64 04/23/2017 1431   ALKPHOS 58 07/18/2016 1135   AST 36 04/17/2022 0927   AST 15 07/18/2016 1135   ALT 38 04/17/2022 0927   ALT 22 04/23/2017 1431   ALT 20 07/18/2016 1135   BILITOT 0.8 04/17/2022 0927   BILITOT 1.24 (H) 07/18/2016 1135      Impression and Plan: Rickey [name] is a very pleasant 81 yo caucasian gentleman with 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   Again, he had the Carvyti which really has done a wonderful job for him.  He will go down to the beach.  He is seen by an oncologist down in [place].  We will make sure that [facility]'s be made for him to be given his IVIG  down there.  Will have him come back to see [place] in either April or May 2024.  Just happy that he is done incredibly well this year.  He really has responded well to the CAR-T therapy.     [name], MD 11/22/202310:28 AM

## 2022-04-17 NOTE — Patient Instructions (Signed)

## 2022-04-17 NOTE — Patient Instructions (Signed)

## 2022-04-19 LAB — KAPPA/LAMBDA LIGHT CHAINS
Kappa free light chain: 17.5 mg/L (ref 3.3–19.4)
Kappa, lambda light chain ratio: 4.86 — ABNORMAL HIGH (ref 0.26–1.65)
Lambda free light chains: 3.6 mg/L — ABNORMAL LOW (ref 5.7–26.3)

## 2022-04-19 LAB — IGG, IGA, IGM
IgA: 29 mg/dL — ABNORMAL LOW (ref 61–437)
IgG (Immunoglobin G), Serum: 708 mg/dL (ref 603–1613)
IgM (Immunoglobulin M), Srm: 27 mg/dL (ref 15–143)

## 2022-04-23 LAB — PROTEIN ELECTROPHORESIS, SERUM, WITH REFLEX
A/G Ratio: 1.4 (ref 0.7–1.7)
Albumin ELP: 3.5 g/dL (ref 2.9–4.4)
Alpha-1-Globulin: 0.2 g/dL (ref 0.0–0.4)
Alpha-2-Globulin: 0.7 g/dL (ref 0.4–1.0)
Beta Globulin: 0.9 g/dL (ref 0.7–1.3)
Gamma Globulin: 0.7 g/dL (ref 0.4–1.8)
Globulin, Total: 2.5 g/dL (ref 2.2–3.9)
Total Protein ELP: 6 g/dL (ref 6.0–8.5)

## 2022-11-15 ENCOUNTER — Encounter: Payer: Self-pay | Admitting: Hematology & Oncology

## 2022-12-25 ENCOUNTER — Other Ambulatory Visit: Payer: Self-pay

## 2022-12-25 DIAGNOSIS — C9001 Multiple myeloma in remission: Secondary | ICD-10-CM

## 2022-12-26 ENCOUNTER — Encounter: Payer: Self-pay | Admitting: Hematology & Oncology

## 2022-12-26 ENCOUNTER — Other Ambulatory Visit: Payer: Self-pay

## 2022-12-26 ENCOUNTER — Inpatient Hospital Stay (HOSPITAL_BASED_OUTPATIENT_CLINIC_OR_DEPARTMENT_OTHER): Payer: Medicare Other | Admitting: Hematology & Oncology

## 2022-12-26 ENCOUNTER — Inpatient Hospital Stay: Payer: Medicare Other | Attending: Hematology & Oncology

## 2022-12-26 ENCOUNTER — Inpatient Hospital Stay: Payer: Medicare Other

## 2022-12-26 VITALS — BP 126/54 | HR 63 | Temp 98.2°F | Resp 18 | Ht 72.0 in | Wt 202.8 lb

## 2022-12-26 DIAGNOSIS — C9002 Multiple myeloma in relapse: Secondary | ICD-10-CM | POA: Insufficient documentation

## 2022-12-26 DIAGNOSIS — D801 Nonfamilial hypogammaglobulinemia: Secondary | ICD-10-CM | POA: Diagnosis not present

## 2022-12-26 DIAGNOSIS — C9001 Multiple myeloma in remission: Secondary | ICD-10-CM

## 2022-12-26 DIAGNOSIS — C9000 Multiple myeloma not having achieved remission: Secondary | ICD-10-CM | POA: Diagnosis not present

## 2022-12-26 LAB — CBC WITH DIFFERENTIAL (CANCER CENTER ONLY)
Abs Immature Granulocytes: 0.02 10*3/uL (ref 0.00–0.07)
Basophils Absolute: 0.1 10*3/uL (ref 0.0–0.1)
Basophils Relative: 1 %
Eosinophils Absolute: 0.1 10*3/uL (ref 0.0–0.5)
Eosinophils Relative: 2 %
HCT: 50 % (ref 39.0–52.0)
Hemoglobin: 17.3 g/dL — ABNORMAL HIGH (ref 13.0–17.0)
Immature Granulocytes: 0 %
Lymphocytes Relative: 27 %
Lymphs Abs: 1.6 10*3/uL (ref 0.7–4.0)
MCH: 35.2 pg — ABNORMAL HIGH (ref 26.0–34.0)
MCHC: 34.6 g/dL (ref 30.0–36.0)
MCV: 101.8 fL — ABNORMAL HIGH (ref 80.0–100.0)
Monocytes Absolute: 0.8 10*3/uL (ref 0.1–1.0)
Monocytes Relative: 13 %
Neutro Abs: 3.5 10*3/uL (ref 1.7–7.7)
Neutrophils Relative %: 57 %
Platelet Count: 157 10*3/uL (ref 150–400)
RBC: 4.91 MIL/uL (ref 4.22–5.81)
RDW: 13.2 % (ref 11.5–15.5)
WBC Count: 6 10*3/uL (ref 4.0–10.5)
nRBC: 0 % (ref 0.0–0.2)

## 2022-12-26 LAB — CMP (CANCER CENTER ONLY)
ALT: 29 U/L (ref 0–44)
AST: 25 U/L (ref 15–41)
Albumin: 3.9 g/dL (ref 3.5–5.0)
Alkaline Phosphatase: 81 U/L (ref 38–126)
Anion gap: 7 (ref 5–15)
BUN: 20 mg/dL (ref 8–23)
CO2: 26 mmol/L (ref 22–32)
Calcium: 8.6 mg/dL — ABNORMAL LOW (ref 8.9–10.3)
Chloride: 105 mmol/L (ref 98–111)
Creatinine: 1.07 mg/dL (ref 0.61–1.24)
GFR, Estimated: >60 mL/min (ref >60)
Glucose, Bld: 209 mg/dL — ABNORMAL HIGH (ref 70–99)
Potassium: 4.2 mmol/L (ref 3.5–5.1)
Sodium: 138 mmol/L (ref 135–145)
Total Bilirubin: 1 mg/dL (ref 0.3–1.2)
Total Protein: 6.1 g/dL — ABNORMAL LOW (ref 6.5–8.1)

## 2022-12-26 LAB — LACTATE DEHYDROGENASE: LDH: 156 U/L (ref 98–192)

## 2022-12-26 MED ORDER — HEPARIN SOD (PORK) LOCK FLUSH 100 UNIT/ML IV SOLN: 500.0000 [IU] | Freq: Once | INTRAVENOUS | Status: DC

## 2022-12-26 MED ORDER — SODIUM CHLORIDE 0.9% FLUSH: 10.0000 mL | INTRAVENOUS | Status: DC | PRN

## 2022-12-26 NOTE — Patient Instructions (Signed)

## 2022-12-26 NOTE — Progress Notes (Signed)
Hematology and Oncology Follow Up Visit  LITTLETON IBARA [id number] 1941-03-26 82 y.o. 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 Elotuzumab/Pomalidomide/decadron -every 4 weeks- start 01/12/2018 Kyprolis/Pomalidomide/Decadron -- s/p cycle #9 -- d/c pomaldomide in 01/2019 Xpovio 80 mg po q week -- start on 12/26/2019 - d/c on 04/2020 Daratumumab -started cycle 1 in January 2022 CARVYKTI --given on 07/2021 [facility] IVIG --40 g Q 6 weeks --next dose in  04/17/2022   Interim History:  Mr. Dieleman is back for follow-up.  We have not seen him for quite a while.  I think the last time that we probably saw him was back in November of last year.  He has been doing okay.  Unfortunate[place].  They have not been able to go back to their [facility].  They are staying with a daughter in [place].  He has seen Dr. Marissa Calamity down in [place].  Everything is going quite well with respect to his myeloma.  He had his CAR-T therapy back in March 2023.  When we last saw him, his Kappa light chain was 1.7 mg/dL.  He has had no problems with his diabetes.  He is monitoring this very closely.  He has had no fever.  Thankfully, there is been no problems with COVID.  Overall, I would say that his performance status is probably ECOG 1.      Medications:  Allergies as of 12/26/2022       Reactions   Ace Inhibitors Cough, Other (See Comments)   Other reaction(s): Cough, cough        Medication List        Accurate as of December 26, 2022 12:18 PM. If you have any questions, ask your nurse or doctor.          STOP taking these medications    ALPRAZolam 0.25 MG tablet Commonly known  as: XANAX Stopped by: Josph Macho   cycloSPORINE 0.05 % ophthalmic emulsion Commonly known as: RESTASIS Stopped by: Josph Macho   diphenhydramine-acetaminophen 25-500 MG Tabs tablet Commonly known as: TYLENOL PM Stopped by: Josph Macho   terbinafine 250 MG tablet Commonly known as: LAMISIL Stopped by: Josph Macho       TAKE these medications    acyclovir 400 MG tablet Commonly known as: ZOVIRAX TAKE 1 TABLET BY MOUTH TWICE DAILY   amLODipine 5 MG tablet Commonly known as: NORVASC Take 5 mg by mouth daily.   aspirin EC 81 MG tablet daily.   atorvastatin 10 MG tablet Commonly known as: LIPITOR Take 1 tablet by mouth daily.   B-D INS SYR ULTRAFINE .5CC/30G 30G X 1/2" 0.5 ML Misc Generic drug: Insulin Syringe-Needle U-100   CALCIUM CARBONATE PO Take 220 mg by mouth 2 (two) times daily.   Cartridge Pump Misc Inject into the skin.   diphenhydrAMINE 25 mg capsule Commonly known as: BENADRYL Take 50 mg by mouth at bedtime as needed for sleep.   fish oil-omega-3 fatty acids 1000 MG capsule Take 1 g by mouth 2 (two) times daily.   freestyle lancets   FREESTYLE LITE test strip Generic drug: glucose blood Test CBG's 3 times a day or as directed  E10.9  insulin lispro 100 UNIT/ML injection Commonly known as: HUMALOG Inject into the skin. Insulin pump 12/21/2018 Typically 80U daily.   irbesartan-hydrochlorothiazide 300-12.5 MG tablet Commonly known as: AVALIDE Take 1 tablet by mouth daily.   lidocaine-prilocaine cream Commonly known as: EMLA Apply 1 application topically as needed.   montelukast 10 MG tablet Commonly known as: SINGULAIR Take 1 tablet by mouth daily.   Vitamin D 50 MCG (2000 UT) Caps Take by mouth every morning.        Allergies:  Allergies  Allergen Reactions   Ace Inhibitors Cough and Other (See Comments)    Other reaction(s): Cough, cough    Past Medical History, Surgical history, Social history, and Family  History were reviewed and updated.  Review of Systems: Review of Systems  Constitutional: Negative.   HENT: Negative.    Eyes: Negative.   Respiratory: Negative.    Cardiovascular: Negative.   Gastrointestinal: Negative.   Genitourinary: Negative.   Musculoskeletal: Negative.   Skin: Negative.   Neurological: Negative.   Endo/Heme/Allergies: Negative.   Psychiatric/Behavioral: Negative.       Physical Exam:  height is 6' (1.829 m) and weight is 202 lb 12.8 oz (92 kg). His oral temperature is 98.2 F (36.8 C). His blood pressure is 126/54 (abnormal) and his pulse is 63. His respiration is 18 and oxygen saturation is 99%.   Wt Readings from Last 3 Encounters:  12/26/22 202 lb 12.8 oz (92 kg)  04/17/22 204 lb (92.5 kg)  03/06/22 213 lb (96.6 kg)    Physical Exam Vitals reviewed.  HENT:     Head: Normocephalic and atraumatic.  Eyes:     Pupils: Pupils are equal, round, and reactive to light.  Cardiovascular:     Rate and Rhythm: Normal rate and regular rhythm.     Heart sounds: Normal heart sounds.  Pulmonary:     Effort: Pulmonary effort is normal.     Breath sounds: Normal breath sounds.  Abdominal:     General: Bowel sounds are normal.     Palpations: Abdomen is soft.  Musculoskeletal:        General: No tenderness or deformity. Normal range of motion.     Cervical back: Normal range of motion.  Lymphadenopathy:     Cervical: No cervical adenopathy.  Skin:    General: Skin is warm and dry.     Findings: No erythema or rash.  Neurological:     Mental Status: He is alert and oriented to person, place, and time.  Psychiatric:        Behavior: Behavior normal.        Thought Content: Thought content normal.        Judgment: Judgment normal.   Lab Results  Component Value Date   WBC 6.0 12/26/2022   HGB 17.3 (H) 12/26/2022   HCT 50.0 12/26/2022   MCV 101.8 (H) 12/26/2022   PLT 157 12/26/2022   Lab Results  Component Value Date   FERRITIN 258 12/28/2018    IRON 98 12/28/2018   TIBC 253 12/28/2018   UIBC 154 12/28/2018   IRONPCTSAT 39 12/28/2018   Lab Results  Component Value Date   RBC 4.91 12/26/2022   Lab Results  Component Value Date   KPAFRELGTCHN 17.5 04/17/2022   LAMBDASER 3.6 (L) 04/17/2022   KAPLAMBRATIO 4.86 (H) 04/17/2022   Lab Results  Component Value Date   IGGSERUM 708 04/17/2022   IGA 29 (L) 04/17/2022   IGMSERUM 27 04/17/2022   Lab Results  Component Value Date   TOTALPROTELP 6.0 04/17/2022   ALBUMINELP 3.5 04/17/2022   A1GS 0.2 04/17/2022   A2GS 0.7 04/17/2022   BETS 0.9 04/17/2022   BETA2SER 0.3 04/19/2015   GAMS 0.7 04/17/2022   MSPIKE Not Observed 04/17/2022   SPEI Comment 04/30/2021     Chemistry      Component Value Date/Time   NA 138 12/26/2022 1110   NA 142 04/23/2017 1431   NA 139 07/18/2016 1135   K 4.2 12/26/2022 1110   K 4.0 04/23/2017 1431   K 4.4 07/18/2016 1135   CL 105 12/26/2022 1110   CL 105 04/23/2017 1431   CO2 26 12/26/2022 1110   CO2 28 04/23/2017 1431   CO2 27 07/18/2016 1135   BUN 20 12/26/2022 1110   BUN 20 04/23/2017 1431   BUN 12.2 07/18/2016 1135   CREATININE 1.07 12/26/2022 1110   CREATININE 1.2 04/23/2017 1431   CREATININE 0.9 07/18/2016 1135      Component Value Date/Time   CALCIUM 8.6 (L) 12/26/2022 1110   CALCIUM 9.3 04/23/2017 1431   CALCIUM 8.9 07/18/2016 1135   ALKPHOS 81 12/26/2022 1110   ALKPHOS 64 04/23/2017 1431   ALKPHOS 58 07/18/2016 1135   AST 25 12/26/2022 1110   AST 15 07/18/2016 1135   ALT 29 12/26/2022 1110   ALT 22 04/23/2017 1431   ALT 20 07/18/2016 1135   BILITOT 1.0 12/26/2022 1110   BILITOT 1.24 (H) 07/18/2016 1135      Impression and Plan: Mr. 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   Again, he had the Carvyti which really has done a wonderful job for him.  We will go ahead with his IVIG today.  Hopefully, his IgG level is going to be  adequate.  We will try to get him back to see [place] in another couple months.     Josph Macho, MD 8/1/202412:18 PM

## 2022-12-27 ENCOUNTER — Other Ambulatory Visit: Payer: Medicare Other

## 2022-12-27 ENCOUNTER — Inpatient Hospital Stay: Payer: Medicare Other

## 2022-12-27 ENCOUNTER — Telehealth: Payer: Self-pay | Admitting: Hematology & Oncology

## 2022-12-27 NOTE — Telephone Encounter (Signed)
Called and spoke with patient regarding scheduling follow up appointment for 03/06/2023 per LOS from 12/26/2022. Patient stated he was not ready to schedule any future appointments and stated he would call back to schedule when he was ready. Patient due for labs, follow up, and IVIG infusion.

## 2022-12-30 ENCOUNTER — Encounter: Payer: Self-pay | Admitting: Hematology & Oncology

## 2022-12-30 NOTE — Addendum Note (Signed)
Addended by: [name] on: 12/30/2022 12:03 PM   Modules accepted: Orders

## 2023-01-18 LAB — LAB REPORT - SCANNED
A1c: 5.8
EGFR: 87

## 2023-01-30 ENCOUNTER — Inpatient Hospital Stay: Payer: Medicare Other | Attending: Hematology & Oncology

## 2023-01-30 VITALS — BP 147/71 | HR 66 | Temp 97.6°F | Resp 18

## 2023-01-30 DIAGNOSIS — C9000 Multiple myeloma not having achieved remission: Secondary | ICD-10-CM

## 2023-01-30 DIAGNOSIS — C9002 Multiple myeloma in relapse: Secondary | ICD-10-CM | POA: Diagnosis present

## 2023-01-30 MED ORDER — HEPARIN SOD (PORK) LOCK FLUSH 100 UNIT/ML IV SOLN
500.0000 [IU] | Freq: Once | INTRAVENOUS | Status: AC
  Administered 2023-01-30: 500 [IU] via INTRAVENOUS

## 2023-01-30 MED ORDER — IMMUNE GLOBULIN (HUMAN) 20 GM/200ML IV SOLN
40.0000 g | Freq: Once | INTRAVENOUS | Status: AC
  Administered 2023-01-30: 40 g via INTRAVENOUS
  Filled 2023-01-30: qty 400

## 2023-01-30 MED ORDER — ACETAMINOPHEN 325 MG PO TABS: 650.0000 mg | ORAL_TABLET | Freq: Once | ORAL | Status: DC

## 2023-01-30 MED ORDER — DIPHENHYDRAMINE HCL 25 MG PO CAPS: 25.0000 mg | ORAL_CAPSULE | Freq: Once | ORAL | Status: DC

## 2023-01-30 MED ORDER — SODIUM CHLORIDE 0.9% FLUSH
10.0000 mL | Freq: Once | INTRAVENOUS | Status: AC
  Administered 2023-01-30: 10 mL

## 2023-01-30 MED ORDER — DEXTROSE 5 % IV SOLN: INTRAVENOUS | Status: DC

## 2023-01-30 NOTE — Progress Notes (Signed)
Patient does not want to stay for the 30 minute recommended IVIG observation. Patient discharged ambulatory without complaints or concerns.

## 2023-02-25 ENCOUNTER — Other Ambulatory Visit: Payer: Self-pay | Admitting: Hematology & Oncology

## 2023-03-06 ENCOUNTER — Other Ambulatory Visit: Payer: Medicare Other

## 2023-03-06 ENCOUNTER — Ambulatory Visit: Payer: Medicare Other | Admitting: Hematology & Oncology

## 2023-03-06 ENCOUNTER — Ambulatory Visit: Payer: Medicare Other

## 2023-03-07 ENCOUNTER — Inpatient Hospital Stay: Payer: Medicare Other

## 2023-03-07 ENCOUNTER — Inpatient Hospital Stay: Payer: Medicare Other | Attending: Hematology & Oncology

## 2023-03-07 VITALS — BP 131/61 | HR 64 | Resp 18

## 2023-03-07 DIAGNOSIS — C9000 Multiple myeloma not having achieved remission: Secondary | ICD-10-CM

## 2023-03-07 DIAGNOSIS — C9002 Multiple myeloma in relapse: Secondary | ICD-10-CM | POA: Diagnosis present

## 2023-03-07 DIAGNOSIS — D801 Nonfamilial hypogammaglobulinemia: Secondary | ICD-10-CM

## 2023-03-07 LAB — CBC WITH DIFFERENTIAL (CANCER CENTER ONLY)
Abs Immature Granulocytes: 0.02 10*3/uL (ref 0.00–0.07)
Basophils Absolute: 0.1 10*3/uL (ref 0.0–0.1)
Basophils Relative: 1 %
Eosinophils Absolute: 0.1 10*3/uL (ref 0.0–0.5)
Eosinophils Relative: 1 %
HCT: 51.7 % (ref 39.0–52.0)
Hemoglobin: 18.1 g/dL — ABNORMAL HIGH (ref 13.0–17.0)
Immature Granulocytes: 0 %
Lymphocytes Relative: 30 %
Lymphs Abs: 2.1 10*3/uL (ref 0.7–4.0)
MCH: 35.1 pg — ABNORMAL HIGH (ref 26.0–34.0)
MCHC: 35 g/dL (ref 30.0–36.0)
MCV: 100.2 fL — ABNORMAL HIGH (ref 80.0–100.0)
Monocytes Absolute: 0.9 10*3/uL (ref 0.1–1.0)
Monocytes Relative: 13 %
Neutro Abs: 3.7 10*3/uL (ref 1.7–7.7)
Neutrophils Relative %: 55 %
Platelet Count: 153 10*3/uL (ref 150–400)
RBC: 5.16 MIL/uL (ref 4.22–5.81)
RDW: 12.6 % (ref 11.5–15.5)
WBC Count: 6.8 10*3/uL (ref 4.0–10.5)
nRBC: 0 % (ref 0.0–0.2)

## 2023-03-07 LAB — CMP (CANCER CENTER ONLY)
ALT: 28 U/L (ref 0–44)
AST: 31 U/L (ref 15–41)
Albumin: 3.7 g/dL (ref 3.5–5.0)
Alkaline Phosphatase: 60 U/L (ref 38–126)
Anion gap: 7 (ref 5–15)
BUN: 19 mg/dL (ref 8–23)
CO2: 25 mmol/L (ref 22–32)
Calcium: 9.3 mg/dL (ref 8.9–10.3)
Chloride: 105 mmol/L (ref 98–111)
Creatinine: 0.99 mg/dL (ref 0.61–1.24)
GFR, Estimated: >60 mL/min (ref >60)
Glucose, Bld: 187 mg/dL — ABNORMAL HIGH (ref 70–99)
Potassium: 4.2 mmol/L (ref 3.5–5.1)
Sodium: 137 mmol/L (ref 135–145)
Total Bilirubin: 1.3 mg/dL — ABNORMAL HIGH (ref 0.3–1.2)
Total Protein: 6.4 g/dL — ABNORMAL LOW (ref 6.5–8.1)

## 2023-03-07 MED ORDER — ACETAMINOPHEN 325 MG PO TABS: 650.0000 mg | ORAL_TABLET | Freq: Once | ORAL | Status: DC

## 2023-03-07 MED ORDER — SODIUM CHLORIDE 0.9% FLUSH
10.0000 mL | INTRAVENOUS | Status: DC | PRN
  Administered 2023-03-07: 10 mL via INTRAVENOUS

## 2023-03-07 MED ORDER — DIPHENHYDRAMINE HCL 25 MG PO CAPS: 25.0000 mg | ORAL_CAPSULE | Freq: Once | ORAL | Status: DC

## 2023-03-07 MED ORDER — HEPARIN SOD (PORK) LOCK FLUSH 100 UNIT/ML IV SOLN
500.0000 [IU] | Freq: Once | INTRAVENOUS | Status: AC
  Administered 2023-03-07: 500 [IU] via INTRAVENOUS

## 2023-03-07 MED ORDER — DEXTROSE 5 % IV SOLN: INTRAVENOUS | Status: DC

## 2023-03-07 MED ORDER — IMMUNE GLOBULIN (HUMAN) 20 GM/200ML IV SOLN
40.0000 g | Freq: Once | INTRAVENOUS | Status: AC
  Administered 2023-03-07: 40 g via INTRAVENOUS
  Filled 2023-03-07: qty 400

## 2023-03-07 NOTE — Patient Instructions (Signed)

## 2023-03-07 NOTE — Patient Instructions (Signed)

## 2023-03-13 ENCOUNTER — Encounter: Payer: Self-pay | Admitting: Hematology & Oncology

## 2023-03-17 ENCOUNTER — Encounter: Payer: Self-pay | Admitting: Hematology & Oncology

## 2023-04-18 ENCOUNTER — Inpatient Hospital Stay (HOSPITAL_BASED_OUTPATIENT_CLINIC_OR_DEPARTMENT_OTHER): Payer: Medicare Other | Admitting: Hematology & Oncology

## 2023-04-18 ENCOUNTER — Inpatient Hospital Stay: Payer: Medicare Other

## 2023-04-18 ENCOUNTER — Other Ambulatory Visit: Payer: Self-pay

## 2023-04-18 ENCOUNTER — Inpatient Hospital Stay: Payer: Medicare Other | Attending: Hematology & Oncology

## 2023-04-18 ENCOUNTER — Encounter: Payer: Self-pay | Admitting: Hematology & Oncology

## 2023-04-18 VITALS — BP 124/74 | HR 64 | Temp 97.8°F | Resp 18 | Ht 72.0 in | Wt 207.0 lb

## 2023-04-18 VITALS — BP 148/84 | HR 67 | Temp 97.7°F | Resp 18

## 2023-04-18 DIAGNOSIS — D801 Nonfamilial hypogammaglobulinemia: Secondary | ICD-10-CM

## 2023-04-18 DIAGNOSIS — C9001 Multiple myeloma in remission: Secondary | ICD-10-CM | POA: Diagnosis not present

## 2023-04-18 DIAGNOSIS — E119 Type 2 diabetes mellitus without complications: Secondary | ICD-10-CM | POA: Diagnosis not present

## 2023-04-18 DIAGNOSIS — C9002 Multiple myeloma in relapse: Secondary | ICD-10-CM | POA: Diagnosis present

## 2023-04-18 DIAGNOSIS — C9000 Multiple myeloma not having achieved remission: Secondary | ICD-10-CM

## 2023-04-18 LAB — CBC WITH DIFFERENTIAL (CANCER CENTER ONLY)
Abs Immature Granulocytes: 0.01 10*3/uL (ref 0.00–0.07)
Basophils Absolute: 0.1 10*3/uL (ref 0.0–0.1)
Basophils Relative: 2 %
Eosinophils Absolute: 0.1 10*3/uL (ref 0.0–0.5)
Eosinophils Relative: 2 %
HCT: 50.7 % (ref 39.0–52.0)
Hemoglobin: 17.8 g/dL — ABNORMAL HIGH (ref 13.0–17.0)
Immature Granulocytes: 0 %
Lymphocytes Relative: 28 %
Lymphs Abs: 1.7 10*3/uL (ref 0.7–4.0)
MCH: 35 pg — ABNORMAL HIGH (ref 26.0–34.0)
MCHC: 35.1 g/dL (ref 30.0–36.0)
MCV: 99.6 fL (ref 80.0–100.0)
Monocytes Absolute: 0.9 10*3/uL (ref 0.1–1.0)
Monocytes Relative: 14 %
Neutro Abs: 3.3 10*3/uL (ref 1.7–7.7)
Neutrophils Relative %: 54 %
Platelet Count: 159 10*3/uL (ref 150–400)
RBC: 5.09 MIL/uL (ref 4.22–5.81)
RDW: 12.9 % (ref 11.5–15.5)
WBC Count: 6.1 10*3/uL (ref 4.0–10.5)
nRBC: 0 % (ref 0.0–0.2)

## 2023-04-18 LAB — CMP (CANCER CENTER ONLY)
ALT: 29 U/L (ref 0–44)
AST: 28 U/L (ref 15–41)
Albumin: 3.8 g/dL (ref 3.5–5.0)
Alkaline Phosphatase: 71 U/L (ref 38–126)
Anion gap: 6 (ref 5–15)
BUN: 17 mg/dL (ref 8–23)
CO2: 26 mmol/L (ref 22–32)
Calcium: 9.7 mg/dL (ref 8.9–10.3)
Chloride: 106 mmol/L (ref 98–111)
Creatinine: 0.95 mg/dL (ref 0.61–1.24)
GFR, Estimated: >60 mL/min (ref >60)
Glucose, Bld: 113 mg/dL — ABNORMAL HIGH (ref 70–99)
Potassium: 4 mmol/L (ref 3.5–5.1)
Sodium: 138 mmol/L (ref 135–145)
Total Bilirubin: 1.1 mg/dL (ref <1.2)
Total Protein: 6.3 g/dL — ABNORMAL LOW (ref 6.5–8.1)

## 2023-04-18 LAB — LACTATE DEHYDROGENASE: LDH: 166 U/L (ref 98–192)

## 2023-04-18 MED ORDER — DEXTROSE 5 % IV SOLN: INTRAVENOUS | Status: DC

## 2023-04-18 MED ORDER — DIPHENHYDRAMINE HCL 25 MG PO CAPS: 25.0000 mg | ORAL_CAPSULE | Freq: Once | ORAL | Status: DC

## 2023-04-18 MED ORDER — SODIUM CHLORIDE 0.9% FLUSH
10.0000 mL | INTRAVENOUS | Status: DC | PRN
  Administered 2023-04-18: 10 mL via INTRAVENOUS

## 2023-04-18 MED ORDER — HEPARIN SOD (PORK) LOCK FLUSH 100 UNIT/ML IV SOLN
500.0000 [IU] | Freq: Once | INTRAVENOUS | Status: AC
  Administered 2023-04-18: 500 [IU] via INTRAVENOUS

## 2023-04-18 MED ORDER — IMMUNE GLOBULIN (HUMAN) 20 GM/200ML IV SOLN
40.0000 g | Freq: Once | INTRAVENOUS | Status: AC
  Administered 2023-04-18: 40 g via INTRAVENOUS
  Filled 2023-04-18: qty 400

## 2023-04-18 MED ORDER — ACETAMINOPHEN 325 MG PO TABS
650.0000 mg | ORAL_TABLET | Freq: Once | ORAL | Status: DC
Start: 2023-04-18 — End: 2023-04-18

## 2023-04-18 NOTE — Patient Instructions (Signed)
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
## 2023-04-20 LAB — IGG, IGA, IGM
IgA: 123 mg/dL (ref 61–437)
IgG (Immunoglobin G), Serum: 784 mg/dL (ref 603–1613)
IgM (Immunoglobulin M), Srm: 32 mg/dL (ref 15–143)

## 2023-04-20 LAB — KAPPA/LAMBDA LIGHT CHAINS
Kappa free light chain: 12.1 mg/L (ref 3.3–19.4)
Kappa, lambda light chain ratio: 2.57 — ABNORMAL HIGH (ref 0.26–1.65)
Lambda free light chains: 4.7 mg/L — ABNORMAL LOW (ref 5.7–26.3)

## 2023-04-23 LAB — PROTEIN ELECTROPHORESIS, SERUM, WITH REFLEX
A/G Ratio: 1.2 (ref 0.7–1.7)
Albumin ELP: 3.1 g/dL (ref 2.9–4.4)
Alpha-1-Globulin: 0.2 g/dL (ref 0.0–0.4)
Alpha-2-Globulin: 0.8 g/dL (ref 0.4–1.0)
Beta Globulin: 0.9 g/dL (ref 0.7–1.3)
Gamma Globulin: 0.7 g/dL (ref 0.4–1.8)
Globulin, Total: 2.6 g/dL (ref 2.2–3.9)
Total Protein ELP: 5.7 g/dL — ABNORMAL LOW (ref 6.0–8.5)

## 2023-05-01 ENCOUNTER — Encounter: Payer: Self-pay | Admitting: Hematology & Oncology

## 2023-06-03 ENCOUNTER — Other Ambulatory Visit: Payer: Self-pay

## 2023-06-03 DIAGNOSIS — C9001 Multiple myeloma in remission: Secondary | ICD-10-CM

## 2023-06-04 ENCOUNTER — Inpatient Hospital Stay: Payer: Medicare Other

## 2023-06-04 ENCOUNTER — Inpatient Hospital Stay: Payer: Medicare Other | Attending: Hematology & Oncology

## 2023-06-04 VITALS — BP 150/77 | HR 59 | Temp 98.4°F | Resp 18

## 2023-06-04 DIAGNOSIS — C9000 Multiple myeloma not having achieved remission: Secondary | ICD-10-CM

## 2023-06-04 DIAGNOSIS — Z5112 Encounter for antineoplastic immunotherapy: Secondary | ICD-10-CM | POA: Diagnosis present

## 2023-06-04 DIAGNOSIS — C9002 Multiple myeloma in relapse: Secondary | ICD-10-CM | POA: Insufficient documentation

## 2023-06-04 DIAGNOSIS — C9001 Multiple myeloma in remission: Secondary | ICD-10-CM

## 2023-06-04 LAB — CMP (CANCER CENTER ONLY)
ALT: 31 U/L (ref 0–44)
AST: 30 U/L (ref 15–41)
Albumin: 3.9 g/dL (ref 3.5–5.0)
Alkaline Phosphatase: 66 U/L (ref 38–126)
Anion gap: 7 (ref 5–15)
BUN: 16 mg/dL (ref 8–23)
CO2: 25 mmol/L (ref 22–32)
Calcium: 9 mg/dL (ref 8.9–10.3)
Chloride: 106 mmol/L (ref 98–111)
Creatinine: 0.95 mg/dL (ref 0.61–1.24)
GFR, Estimated: >60 mL/min (ref >60)
Glucose, Bld: 172 mg/dL — ABNORMAL HIGH (ref 70–99)
Potassium: 4.1 mmol/L (ref 3.5–5.1)
Sodium: 138 mmol/L (ref 135–145)
Total Bilirubin: 1.3 mg/dL — ABNORMAL HIGH (ref 0.0–1.2)
Total Protein: 6 g/dL — ABNORMAL LOW (ref 6.5–8.1)

## 2023-06-04 LAB — LACTATE DEHYDROGENASE: LDH: 169 U/L (ref 98–192)

## 2023-06-04 LAB — CBC WITH DIFFERENTIAL (CANCER CENTER ONLY)
Abs Immature Granulocytes: 0.02 10*3/uL (ref 0.00–0.07)
Basophils Absolute: 0 10*3/uL (ref 0.0–0.1)
Basophils Relative: 1 %
Eosinophils Absolute: 0.1 10*3/uL (ref 0.0–0.5)
Eosinophils Relative: 2 %
HCT: 49.1 % (ref 39.0–52.0)
Hemoglobin: 17 g/dL (ref 13.0–17.0)
Immature Granulocytes: 0 %
Lymphocytes Relative: 26 %
Lymphs Abs: 1.5 10*3/uL (ref 0.7–4.0)
MCH: 35.1 pg — ABNORMAL HIGH (ref 26.0–34.0)
MCHC: 34.6 g/dL (ref 30.0–36.0)
MCV: 101.2 fL — ABNORMAL HIGH (ref 80.0–100.0)
Monocytes Absolute: 0.8 10*3/uL (ref 0.1–1.0)
Monocytes Relative: 14 %
Neutro Abs: 3.3 10*3/uL (ref 1.7–7.7)
Neutrophils Relative %: 57 %
Platelet Count: 139 10*3/uL — ABNORMAL LOW (ref 150–400)
RBC: 4.85 MIL/uL (ref 4.22–5.81)
RDW: 13.2 % (ref 11.5–15.5)
WBC Count: 5.8 10*3/uL (ref 4.0–10.5)
nRBC: 0 % (ref 0.0–0.2)

## 2023-06-04 MED ORDER — DEXTROSE 5 % IV SOLN: INTRAVENOUS | Status: DC

## 2023-06-04 MED ORDER — HEPARIN SOD (PORK) LOCK FLUSH 100 UNIT/ML IV SOLN
500.0000 [IU] | Freq: Once | INTRAVENOUS | Status: AC
Start: 2023-06-04 — End: 2023-06-04
  Administered 2023-06-04: 500 [IU] via INTRAVENOUS

## 2023-06-04 MED ORDER — ACETAMINOPHEN 325 MG PO TABS: 650.0000 mg | ORAL_TABLET | Freq: Once | ORAL | Status: DC

## 2023-06-04 MED ORDER — IMMUNE GLOBULIN (HUMAN) 20 GM/200ML IV SOLN
40.0000 g | Freq: Once | INTRAVENOUS | Status: AC
  Administered 2023-06-04: 40 g via INTRAVENOUS
  Filled 2023-06-04: qty 400

## 2023-06-04 MED ORDER — DIPHENHYDRAMINE HCL 25 MG PO CAPS: 25.0000 mg | ORAL_CAPSULE | Freq: Once | ORAL | Status: DC

## 2023-06-04 MED ORDER — SODIUM CHLORIDE 0.9% FLUSH
10.0000 mL | Freq: Once | INTRAVENOUS | Status: AC
  Administered 2023-06-04: 10 mL

## 2023-06-04 NOTE — Progress Notes (Signed)
Patient does not want to stay for the 30 minute recommended post IVIG observation. Patient discharged ambulatory without complaints or concerns.

## 2023-06-04 NOTE — Patient Instructions (Signed)

## 2023-06-04 NOTE — Patient Instructions (Signed)

## 2023-06-05 LAB — KAPPA/LAMBDA LIGHT CHAINS
Kappa free light chain: 10.4 mg/L (ref 3.3–19.4)
Kappa, lambda light chain ratio: 1.51 (ref 0.26–1.65)
Lambda free light chains: 6.9 mg/L (ref 5.7–26.3)

## 2023-06-06 LAB — IGG, IGA, IGM
IgA: 116 mg/dL (ref 61–437)
IgG (Immunoglobin G), Serum: 717 mg/dL (ref 603–1613)
IgM (Immunoglobulin M), Srm: 32 mg/dL (ref 15–143)

## 2023-06-09 LAB — PROTEIN ELECTROPHORESIS, SERUM
A/G Ratio: 1.6 (ref 0.7–1.7)
Albumin ELP: 3.6 g/dL (ref 2.9–4.4)
Alpha-1-Globulin: 0.2 g/dL (ref 0.0–0.4)
Alpha-2-Globulin: 0.6 g/dL (ref 0.4–1.0)
Beta Globulin: 0.8 g/dL (ref 0.7–1.3)
Gamma Globulin: 0.6 g/dL (ref 0.4–1.8)
Globulin, Total: 2.2 g/dL (ref 2.2–3.9)
Total Protein ELP: 5.8 g/dL — ABNORMAL LOW (ref 6.0–8.5)

## 2023-08-01 ENCOUNTER — Encounter: Payer: Self-pay | Admitting: Hematology & Oncology

## 2023-10-16 ENCOUNTER — Other Ambulatory Visit: Payer: Self-pay

## 2023-10-16 DIAGNOSIS — C9001 Multiple myeloma in remission: Secondary | ICD-10-CM

## 2023-10-16 DIAGNOSIS — C9000 Multiple myeloma not having achieved remission: Secondary | ICD-10-CM

## 2023-10-17 ENCOUNTER — Inpatient Hospital Stay: Attending: Hematology & Oncology

## 2023-10-17 ENCOUNTER — Inpatient Hospital Stay

## 2023-10-17 ENCOUNTER — Inpatient Hospital Stay (HOSPITAL_BASED_OUTPATIENT_CLINIC_OR_DEPARTMENT_OTHER): Admitting: Hematology & Oncology

## 2023-10-17 ENCOUNTER — Encounter: Payer: Self-pay | Admitting: Hematology & Oncology

## 2023-10-17 VITALS — BP 136/69 | HR 60 | Temp 97.6°F | Resp 20 | Ht 72.0 in | Wt 207.1 lb

## 2023-10-17 DIAGNOSIS — Z95828 Presence of other vascular implants and grafts: Secondary | ICD-10-CM

## 2023-10-17 DIAGNOSIS — Z9484 Stem cells transplant status: Secondary | ICD-10-CM | POA: Diagnosis not present

## 2023-10-17 DIAGNOSIS — C9001 Multiple myeloma in remission: Secondary | ICD-10-CM

## 2023-10-17 DIAGNOSIS — C9000 Multiple myeloma not having achieved remission: Secondary | ICD-10-CM

## 2023-10-17 DIAGNOSIS — C9002 Multiple myeloma in relapse: Secondary | ICD-10-CM | POA: Insufficient documentation

## 2023-10-17 LAB — CBC WITH DIFFERENTIAL (CANCER CENTER ONLY)
Abs Immature Granulocytes: 0.03 10*3/uL (ref 0.00–0.07)
Basophils Absolute: 0.1 10*3/uL (ref 0.0–0.1)
Basophils Relative: 1 %
Eosinophils Absolute: 0.1 10*3/uL (ref 0.0–0.5)
Eosinophils Relative: 2 %
HCT: 47.6 % (ref 39.0–52.0)
Hemoglobin: 16.3 g/dL (ref 13.0–17.0)
Immature Granulocytes: 0 %
Lymphocytes Relative: 22 %
Lymphs Abs: 1.6 10*3/uL (ref 0.7–4.0)
MCH: 34.8 pg — ABNORMAL HIGH (ref 26.0–34.0)
MCHC: 34.2 g/dL (ref 30.0–36.0)
MCV: 101.5 fL — ABNORMAL HIGH (ref 80.0–100.0)
Monocytes Absolute: 0.9 10*3/uL (ref 0.1–1.0)
Monocytes Relative: 12 %
Neutro Abs: 4.6 10*3/uL (ref 1.7–7.7)
Neutrophils Relative %: 63 %
Platelet Count: 158 10*3/uL (ref 150–400)
RBC: 4.69 MIL/uL (ref 4.22–5.81)
RDW: 13.2 % (ref 11.5–15.5)
WBC Count: 7.3 10*3/uL (ref 4.0–10.5)
nRBC: 0 % (ref 0.0–0.2)

## 2023-10-17 LAB — CMP (CANCER CENTER ONLY)
ALT: 36 U/L (ref 0–44)
AST: 30 U/L (ref 15–41)
Albumin: 4.2 g/dL (ref 3.5–5.0)
Alkaline Phosphatase: 72 U/L (ref 38–126)
Anion gap: 7 (ref 5–15)
BUN: 22 mg/dL (ref 8–23)
CO2: 26 mmol/L (ref 22–32)
Calcium: 9 mg/dL (ref 8.9–10.3)
Chloride: 104 mmol/L (ref 98–111)
Creatinine: 1 mg/dL (ref 0.61–1.24)
GFR, Estimated: >60 mL/min (ref >60)
Glucose, Bld: 164 mg/dL — ABNORMAL HIGH (ref 70–99)
Potassium: 4.3 mmol/L (ref 3.5–5.1)
Sodium: 137 mmol/L (ref 135–145)
Total Bilirubin: 1.2 mg/dL (ref 0.0–1.2)
Total Protein: 5.9 g/dL — ABNORMAL LOW (ref 6.5–8.1)

## 2023-10-17 LAB — LACTATE DEHYDROGENASE: LDH: 177 U/L (ref 98–192)

## 2023-10-17 MED ORDER — SODIUM CHLORIDE 0.9% FLUSH
10.0000 mL | Freq: Once | INTRAVENOUS | Status: AC
  Administered 2023-10-17: 10 mL via INTRAVENOUS

## 2023-10-17 MED ORDER — HEPARIN SOD (PORK) LOCK FLUSH 100 UNIT/ML IV SOLN
500.0000 [IU] | Freq: Once | INTRAVENOUS | Status: AC
  Administered 2023-10-17: 500 [IU] via INTRAVENOUS

## 2023-10-17 NOTE — Progress Notes (Signed)
 Hematology and Oncology Follow Up Visit  GURDEEP KEESEY [id number] 25-Jun-1940 83 y.o. 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 Elotuzumab /Pomalidomide /decadron  -every 4 weeks- start 01/12/2018 Kyprolis /Pomalidomide /Decadron  -- s/p cycle #9 -- d/c pomaldomide in 01/2019 Xpovio  80 mg po q week -- start on 12/26/2019 - d/c on 04/2020 Daratumumab  -started cycle 1 in January 2022 CARVYKTI --given on 07/2021 [facility] IVIG --40 g Q 6 weeks --next dose in  04/17/2022   Interim History:  Mr. Carneiro is back for follow-up.  Things going well for he and his wife.  They both are now 13 years old.  That a wonderful time in [place].  They had a 2-week trip over there.  I am so glad that they are out of make the trip.  He appeared to develop pneumonia couple weeks ago.  He was on antibiotics for this.  This had this episode of pneumonia, he really has had no other problems.  His last IgG level that we checked on him back in January was 717 mg/dL.  He has had no problems with respect to the myeloma.  The CAR-T therapy was back in March 2023.  He really has done incredibly well with this.  His last Kappa light chain was 1.0 mg/dL.  This was also back in January.  His blood sugars have been doing pretty well.  He has had no change in bowel or bladder habits.  He and his wife now live in [place].  They are happy living there.  They have downsized.  Currently, I would say his performance status is probably ECOG 1.     Medications:  Allergies as of 10/17/2023       Reactions   Ace Inhibitors Cough, Other (See Comments)   Other reaction(s): Cough, cough        Medication List        Accurate as of Oct 17, 2023  9:25  AM. If you have any questions, ask your nurse or doctor.          STOP taking these medications    diphenhydrAMINE  25 mg capsule Commonly known as: BENADRYL  Stopped by: Ivor Mars       TAKE these medications    acyclovir  400 MG tablet Commonly known as: ZOVIRAX  TAKE 1 TABLET BY MOUTH TWICE DAILY   amLODipine 5 MG tablet Commonly known as: NORVASC Take 5 mg by mouth daily.   aspirin EC 81 MG tablet daily.   atorvastatin 10 MG tablet Commonly known as: LIPITOR Take 1 tablet by mouth daily.   B-D INS SYR ULTRAFINE .5CC/30G 30G X 1/2" 0.5 ML Misc Generic drug: Insulin  Syringe-Needle U-100   CALCIUM CARBONATE PO Take 220 mg by mouth 2 (two) times daily.   Cartridge Pump Misc Inject into the skin.   Dexcom G6 Sensor Misc Place 1 sensor onto the skin as directed every 10 days for continuous glucose monitoring.   fish oil-omega-3 fatty acids 1000 MG capsule Take 1 g by mouth 2 (two) times daily.   freestyle lancets   FREESTYLE LITE test strip Generic drug: glucose blood Test CBG's 3 times a day or as directed  E10.9   insulin  lispro 100 UNIT/ML injection Commonly known as: HUMALOG Inject into the  skin. Insulin  pump 12/21/2018 Typically 80U daily.   irbesartan-hydrochlorothiazide 300-12.5 MG tablet Commonly known as: AVALIDE Take 1 tablet by mouth daily.   lidocaine -prilocaine  cream Commonly known as: EMLA  Apply 1 application topically as needed.   montelukast  10 MG tablet Commonly known as: SINGULAIR  Take 1 tablet by mouth daily.   Vitamin D  50 MCG (2000 UT) Caps Take by mouth every morning.   zolpidem 10 MG tablet Commonly known as: AMBIEN Take 10 mg by mouth at bedtime as needed.        Allergies:  Allergies  Allergen Reactions   Ace Inhibitors Cough and Other (See Comments)    Other reaction(s): Cough, cough    Past Medical History, Surgical history, Social history, and Family History were reviewed and updated.  Review of  Systems: Review of Systems  Constitutional: Negative.   HENT: Negative.    Eyes: Negative.   Respiratory: Negative.    Cardiovascular: Negative.   Gastrointestinal: Negative.   Genitourinary: Negative.   Musculoskeletal: Negative.   Skin: Negative.   Neurological: Negative.   Endo/Heme/Allergies: Negative.   Psychiatric/Behavioral: Negative.       Physical Exam:  height is 6' (1.829 m) and weight is 207 lb 1.3 oz (93.9 kg). His oral temperature is 97.6 F (36.4 C). His blood pressure is 136/69 and his pulse is 60. His respiration is 20 and oxygen saturation is 98%.   Wt Readings from Last 3 Encounters:  10/17/23 207 lb 1.3 oz (93.9 kg)  04/18/23 207 lb (93.9 kg)  12/26/22 202 lb 12.8 oz (92 kg)    Physical Exam Vitals reviewed.  HENT:     Head: Normocephalic and atraumatic.  Eyes:     Pupils: Pupils are equal, round, and reactive to light.  Cardiovascular:     Rate and Rhythm: Normal rate and regular rhythm.     Heart sounds: Normal heart sounds.  Pulmonary:     Effort: Pulmonary effort is normal.     Breath sounds: Normal breath sounds.  Abdominal:     General: Bowel sounds are normal.     Palpations: Abdomen is soft.  Musculoskeletal:        General: No tenderness or deformity. Normal range of motion.     Cervical back: Normal range of motion.  Lymphadenopathy:     Cervical: No cervical adenopathy.  Skin:    General: Skin is warm and dry.     Findings: No erythema or rash.  Neurological:     Mental Status: He is alert and oriented to person, place, and time.  Psychiatric:        Behavior: Behavior normal.        Thought Content: Thought content normal.        Judgment: Judgment normal.   Lab Results  Component Value Date   WBC 7.3 10/17/2023   HGB 16.3 10/17/2023   HCT 47.6 10/17/2023   MCV 101.5 (H) 10/17/2023   PLT 158 10/17/2023   Lab Results  Component Value Date   FERRITIN 258 12/28/2018   IRON 98 12/28/2018   TIBC 253 12/28/2018   UIBC 154  12/28/2018   IRONPCTSAT 39 12/28/2018   Lab Results  Component Value Date   RBC 4.69 10/17/2023   Lab Results  Component Value Date   KPAFRELGTCHN 10.4 06/04/2023   LAMBDASER 6.9 06/04/2023   KAPLAMBRATIO 1.51 06/04/2023   Lab Results  Component Value Date   IGGSERUM 717 06/04/2023   IGA 116 06/04/2023   IGMSERUM 32 06/04/2023  Lab Results  Component Value Date   TOTALPROTELP 5.8 (L) 06/04/2023   ALBUMINELP 3.6 06/04/2023   A1GS 0.2 06/04/2023   A2GS 0.6 06/04/2023   BETS 0.8 06/04/2023   BETA2SER 0.3 04/19/2015   GAMS 0.6 06/04/2023   MSPIKE Not Observed 06/04/2023   SPEI Comment 06/04/2023     Chemistry      Component Value Date/Time   NA 137 10/17/2023 0841   NA 142 04/23/2017 1431   NA 139 07/18/2016 1135   K 4.3 10/17/2023 0841   K 4.0 04/23/2017 1431   K 4.4 07/18/2016 1135   CL 104 10/17/2023 0841   CL 105 04/23/2017 1431   CO2 26 10/17/2023 0841   CO2 28 04/23/2017 1431   CO2 27 07/18/2016 1135   BUN 22 10/17/2023 0841   BUN 20 04/23/2017 1431   BUN 12.2 07/18/2016 1135   CREATININE 1.00 10/17/2023 0841   CREATININE 1.2 04/23/2017 1431   CREATININE 0.9 07/18/2016 1135      Component Value Date/Time   CALCIUM 9.0 10/17/2023 0841   CALCIUM 9.3 04/23/2017 1431   CALCIUM 8.9 07/18/2016 1135   ALKPHOS 72 10/17/2023 0841   ALKPHOS 64 04/23/2017 1431   ALKPHOS 58 07/18/2016 1135   AST 30 10/17/2023 0841   AST 15 07/18/2016 1135   ALT 36 10/17/2023 0841   ALT 22 04/23/2017 1431   ALT 20 07/18/2016 1135   BILITOT 1.2 10/17/2023 0841   BILITOT 1.24 (H) 07/18/2016 1135      Impression and Plan: Mr. 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   Again, he had the Carvyti therapy back in March 2023.  This really has done a wonderful job for him.  I really do not think he needs any IVIG.  We will see what his IgG level is.  Will plan to flush his Port-A-Cath every 2  months.  Will see him back in 6 months.   Ivor Mars, MD 5/23/20259:25 AM

## 2023-10-18 LAB — IGG, IGA, IGM
IgA: 139 mg/dL (ref 61–437)
IgG (Immunoglobin G), Serum: 494 mg/dL — ABNORMAL LOW (ref 603–1613)
IgM (Immunoglobulin M), Srm: 27 mg/dL (ref 15–143)

## 2023-10-21 LAB — KAPPA/LAMBDA LIGHT CHAINS
Kappa free light chain: 7.9 mg/L (ref 3.3–19.4)
Kappa, lambda light chain ratio: 1.49 (ref 0.26–1.65)
Lambda free light chains: 5.3 mg/L — ABNORMAL LOW (ref 5.7–26.3)

## 2023-12-11 ENCOUNTER — Inpatient Hospital Stay: Attending: Hematology & Oncology

## 2023-12-11 VITALS — BP 136/65 | HR 63 | Temp 97.7°F | Resp 18

## 2023-12-11 DIAGNOSIS — C9002 Multiple myeloma in relapse: Secondary | ICD-10-CM | POA: Diagnosis present

## 2023-12-11 DIAGNOSIS — Z452 Encounter for adjustment and management of vascular access device: Secondary | ICD-10-CM | POA: Insufficient documentation

## 2023-12-11 DIAGNOSIS — C9001 Multiple myeloma in remission: Secondary | ICD-10-CM

## 2023-12-11 MED ORDER — HEPARIN SOD (PORK) LOCK FLUSH 100 UNIT/ML IV SOLN
500.0000 [IU] | Freq: Once | INTRAVENOUS | Status: AC
  Administered 2023-12-11: 500 [IU] via INTRAVENOUS

## 2023-12-11 MED ORDER — SODIUM CHLORIDE 0.9% FLUSH
10.0000 mL | INTRAVENOUS | Status: DC | PRN
  Administered 2023-12-11: 10 mL via INTRAVENOUS

## 2023-12-11 NOTE — Patient Instructions (Signed)

## 2023-12-12 ENCOUNTER — Inpatient Hospital Stay

## 2024-01-26 NOTE — Progress Notes (Signed)
 Plasma Cell Diagnosis: Multiple Myeloma  Referred by: Dr. 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  He did not experience CRS, neurotoxicity, cytopenias or infectious complications.  Today, he is doing well.  His serum free kappa light chains remain within normal limits.    He would be a terrific candidate for our next generation BCL2 inhibitor study given the presence of the (11;14) translocation if his disease were to progress in the future.  Off label venetoclax would be another option.     ECOG PS 1 / KPS 80.     Multiple myeloma -- Day 894 post-Cilta-cel CAR-T cell therapy -- Myeloma markers quarterly with Dr. Crease.   Infection Prophylaxis -- Acyclovir  400mg  twice daily -- Patient is vaccinated for COVID-19, Moderna, 06/2019, 07/2019 and 3rd dose on 12/2019.  Bivalent vaccine 02/07/2021.  -- Post-CAR T cell bivalent covid vaccine 11/2021. -- The patient received the updated covid booster in the Fall / Winter of 2023 and 02/2023.   -- RSV vaccine 01/2022 -- Flu vaccine received Fall / Winter 2024.   -- Mild COVID infection 05/2021, S/P Paxlovid x 5 days -- PCV-20 on 12/20/2021.  -- IVIg for IgG level < 400 mg/dL.   -- Bactrim SS discontinued in late 09/2021.     Obstructive Uropathy -- Status post TURP 06/27/2021 -- AKI resolved   Type 1 DM -- Patient has insulin  pump  Non-Melanoma Skin Cancers -- The patient has close Dermatologic follow-up  Erythrocytosis -- Epo level normal.  WBC and Plts not elevated.   -- Low threshold to initiate work-up if Hgb remains elevated.    Follow-Up -- 6 months for virtual visit.   -- Close follow-up with Drs. Rickey [name]    Rickey Bolk, MD Chief, Plasma Cell Disorders Division [facility] [phone] (office) [phone] (fax) [name]   Location Information: Patient State (at time of visit): [place]  Patient Location (at time of visit):Home/Other Non-Medical  Provider Location: Hospital/Provider-Based Clinic Is provider licensed to provide clinical care in the current location/state of the patient? Yes  Consent:  Patient's identity was confirmed. Presenting condition or illness was discussed with the patient/personal representative. Current proposed treatment for presenting condition or illness was explained to patient/personal representative along with the likely benefits and any significant risks or complications associated with the provision of treatment by audio/video means. The patient/personal representative verbally authorized treatment to be provided by audio/video, which may include a limited review of patient's current health status, medication, or other treatment recommendations, patient education, and an opportunity to ask questions about condition and treatment. Verbal Consent Granted by Patient/Personal Representative:Yes   Visit Information: Modality: Audio-Only  Time Spent on Phone w/ Patient: 12 minutes.     Interval History 01/12/2024: Rickey [name] returns today virtually for post CAR-T follow up.  Overall, the patient continues to do well.  He states that he is peachy.  His energy level is good.  He has no MSK pain.  He has chronic neuropathy that is unchanged in severity.  He has no problems with cognition.  He denies any problems with balance gait or instability.  He has had no falls.  He developed a respiratory  tract infection after his trip to [place] that was treated with a course of oral antibiotics.  He has no other symptoms to report.      Oncology History Overview Note  Plasma Cell Disorder History Date of  diagnosis: 05/2008  In December of 2009 he had an MRI of his shoulder which showed marrow signal abnormalities suspicious for myeloma. He had serum protein electrophoresis with an M-spike that was too small to quantitate. He had a normal CBC. He was referred to see Dr. Timmy on 06/15/08. He had a skeletal survey that showed extensive lytic disease in his skull and mandible, but no other lesions. He had a bone marrow on 07/06/08 that revealed 80% to 90% plasma cells with normal cytogenetics and FISH positive for a del(13) and a t(11;14) translocation. His serum free kappa light chain level was 1,040 mg/dl and a 24 hour urine showed 10,000 mg of kappa light chains.   Treatment History  First Line Therapy -- Lenalidomide, bortezomib  and dex 06/2008 - 10/2008. Best response: VGPR.  -- Stem cell mobilization with GCSF and plerixafor late 11/2008. 6.8 x 10e6 CD34 cells/kg collected.  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 Best response: VGPR vs CR.   -- Lenalidomide maintenance beginning 02/2009.  Stopped in 02/2011.     Second Line Therapy -- Pomalidomide  and dexamethasone  beginning 11/2014.  Bortezomib  introduced 02/2015 and later stopped in 02/2016.  Best response: PR.  DCed due to disease progression.   Third Line Therapy -- Ixazomib, pomalidomide  and dex beginning 04/2017.  3 cycles.  Best response: PD.    Fourth Line Therapy -- Carfilzomib  and pomalidomide  beginning 09/29/2017.  Discontinued after 2 cycles due to toxicity (patient was admitted to the hospital in June 2019 due to shortness of breath and treated for pertussis). Best response: Transient PR followed by PD.    Fifth Line Therapy -- Elotuzumab /pomalidomide /dexamethasone  beginning 01/12/2018.  Best response: PD.    Sixth Line Therapy -- Carfilzomib , pomalidomide  and dexamethasone  beginning 04/06/2018.  Pomalidomide  was discontinued on 12/21/2018.  Best response: VGPR.  D/Ced 2/2 PD.    Seventh Line  Therapy Selinexor  80 mg p.o. q. weekly was added to carfilzomib  on 12/26/2019 due to PD.  Best response: SD.     Eighth Line Therapy -- Daratumumab  and carfilzomib  beginning 05/2020.  Carfilzomib  d/ced in late 05/2020.  Best response: PR.  D/Ced 2/2 biochemical progression of disease.   Ninth Line Therapy -- Apheresis for Cilta-cel manufacturing on 04/25/2021 due to slow biochemical PD.   -- Bridging therapy with single agent daratumumab . His last dara dose was 07/04/2021.  -- LDC with fludarabine and cyclophosphamide 07/27/21 - 3/523 -- Carvykti CAR-T cell infusion 08/01/21. He received 0.8x10^6 CAR-T cells/kg. No CRS or ICANS.  Best response: MRD- CR.    Bone Health -- Prior zoledronic acid.    Supportive Care -- Patient receiving IVIG 40 g IV q. 6-weeks   Multiple myeloma not having achieved remission    (CMD)  03/18/2021 Initial Diagnosis   Multiple myeloma not having achieved remission (CMS/HCC)   04/24/2021 - 04/25/2021 Supportive Treatment   BMT APHERESIS ORDERS FOR UN-MOBILIZED COLLECTIONS Plan Provider: Maude Ozell Bolk, MD   06/20/2021 - 06/20/2021 Oncology Treatment   Protocol Lymphodepleting Fludarabine Cyclophosphamide for Ciltacabtagene Autoleucel (Carvykti)  Chemotherapy/Immunotherapy Medications ciltacabtagene autoleucel (CARVYKTI) infusion 70 mL, 70 mL (original dose ), intravenous Dose modification: 70 mL (Cycle 1) fludarabine (FLUDARA) 65 mg in sodium chloride  0.9 % 102.6 mL chemo IVPB, 30 mg/m2 =  65 mg, intravenous CycloPHOSPHAMIDE (CYTOXAN) 594 mg in sodium chloride  0.9 % 129.7 mL chemo IVPB, 300 mg/m2 = 594 mg, intravenous  Change in Level of Care (Holding off until medical conditions resolve)   07/04/2021 - 07/04/2021 Oncology Treatment   Protocol Lymphodepleting Fludarabine Cyclophosphamide for Ciltacabtagene Autoleucel (Carvykti)  Chemotherapy/Immunotherapy Medications ciltacabtagene autoleucel (CARVYKTI) infusion 70 mL, 70 mL (original dose ),  intravenous Dose modification: 70 mL (Cycle 1) fludarabine (FLUDARA) 65 mg in sodium chloride  0.9 % 102.6 mL chemo IVPB, 30 mg/m2, intravenous CycloPHOSPHAMIDE (CYTOXAN) 574 mg in sodium chloride  0.9 % 128.7 mL chemo IVPB, 300 mg/m2 = 574 mg, intravenous  Change in Level of Care [facility] postponed for TURP prior to CAR T)   07/27/2021 - 08/05/2021 Oncology Treatment   Protocol INPT Lymphodepleting Fludarabine Cyclophosphamide for Ciltacabtagene Autoleucel (Carvykti)  Chemotherapy/Immunotherapy Medications ciltacabtagene autoleucel (CARVYKTI) infusion 70 mL, 70 mL (100 % of original dose 70 mL), intravenous Dose modification: 70 mL (original dose 70 mL, Cycle 1) Administration: 70 mL (08/01/2021) fludarabine (FLUDARA) 65 mg in sodium chloride  0.9 % 102.6 mL chemo IVPB, 30 mg/m2 = 65 mg, intravenous Administration: 65 mg (07/27/2021), 65 mg (07/28/2021), 65 mg (07/29/2021) cycloPHOSphamide (CYTOXAN) 594 mg in sodium chloride  0.9 % 129.7 mL chemo IVPB, 300 mg/m2 = 594 mg, intravenous Administration: 594 mg (07/27/2021), 594 mg (07/28/2021), 594 mg (07/29/2021)  02 - Therapy Complete   08/13/2021 - 08/13/2021 Supportive Treatment   ADULT OP BLOOD ADMINISTRATION PRN X 30 DAYS Plan Provider: Maude Ozell Bolk, MD   08/13/2021 -  Supportive Treatment   ADULT PERIPHERAL IV AND CENTRAL VENOUS ACCESS ORDERS Plan Provider: Maude Ozell Bolk, MD     Cancer Staging <redacted file path>  No matching staging information was found for the patient.  [No matching plan found]   Review of Systems Review of Systems  Constitutional:  Positive for fatigue. Negative for fever.  HENT:  Negative.    Eyes: Negative.   Respiratory: Negative.  Negative for cough and shortness of breath.   Cardiovascular: Negative.  Negative for leg swelling.  Gastrointestinal: Negative.  Negative for diarrhea and nausea.  Endocrine: Negative.   Genitourinary: Negative.  Negative for difficulty urinating and dysuria.    Musculoskeletal: Negative.  Negative for back pain.  Skin: Negative.  Negative for rash.  Neurological: Negative.  Negative for dizziness.  Hematological:  Bruises/bleeds easily.  Psychiatric/Behavioral: Negative.  Negative for confusion and decreased concentration.      Medications Current Outpatient Medications on File Prior to Visit  Medication Sig Dispense Refill  . acyclovir  (ZOVIRAX ) 400 mg tablet Take 400 mg by mouth 2 (two) times a day.    SABRA amLODIPine (NORVASC) 5 mg tablet Take 5 mg by mouth every evening.    SABRA aspirin 81 mg chewable tablet Chew 1 tablet (81 mg total) daily. 90 tablet 0  . atorvastatin (LIPITOR) 10 mg tablet Take 10 mg by mouth nightly.    . blood-glucose sensor (Dexcom G6 Sensor) Place 1 sensor onto the skin as directed every 10 days for continuous glucose monitoring.    . calcium carbonate (CALCIUM 500 ORAL) Take 1 tablet by mouth 2 (two) times a day.    . cholecalciferol (VITAMIN D3) 2,000 unit cap capsule Take 2,000 Units by mouth every morning.    SABRA HumaLOG U-100 Insulin  100 unit/mL injection Use 140 or more units per day in insulin  pump.    SABRA irbesartan-hydrochlorothiazide (AVALIDE) 300-12.5 mg per tablet Take 1 tablet by mouth every morning.    SABRA  montelukast  (SINGULAIR ) 10 mg tablet Take 10 mg by mouth at bedtime.    SABRA omega 3-dha-epa-fish oil (OMEGA 3) 1,000 mg DR capsule Take 1 capsule by mouth 2 (two) times a day.    . zolpidem (AMBIEN) 10 mg tablet Take 10 mg by mouth nightly as needed for sleep.     No current facility-administered medications on file prior to visit.    PMX/PSHx Patient Active Problem List  Diagnosis  . Dermatochalasis of both upper eyelids  . 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 History of nonmelanoma skin cancer  . Hypogammaglobulinemia (CMD)  . Epiretinal membrane (ERM) of right eye  . Lattice degeneration of left retina  . Pseudophakia of both eyes   . Ptosis of both eyelids  . Status post intraocular lens implant  . Multiple myeloma not having achieved remission    (CMD)  . History of cellular therapy  . PSA elevation  . Asthma without status asthmaticus (CMD)  . Essential hypertension  . Benign prostatic hyperplasia without urinary obstruction  . Hyperlipidemia  . Insomnia  . RBBB  . Type 1 diabetes mellitus    (CMD)  . Prostate cancer    (CMD)     Past Surgical History:  Procedure Laterality Date  . LEG SURGERY Left 2010   broke tibia and fibia  . MOHS SURGERY Left    L hand SCC  . PROSTATE BIOPSY N/A 10/29/2023   BIOPSY/EXCISION PROSTATE WITH MR FUSION performed by Tanda Jama Nicholaus DOUGLAS, MD at [facility] OR    Family History  Problem Relation Name Age of Onset  . Prostate cancer Father    . Prostate cancer Brother      Social History   Tobacco Use  . Smoking status: Former    Current packs/day: 0.00    Types: Cigarettes    Quit date: 1977    Years since quitting: 48.6  . Smokeless tobacco: Never  Substance Use Topics  . Alcohol use: Yes    Comment: a couple glasses of wine a night     Objective: There were no vitals filed for this visit.    There is no height or weight on file to calculate BSA.   Physical Exam Virtual  ECOG PS: 1 (Strenuous physical activity restricted; fully ambulatory and able to carry out light work)   KPS PS: 80 Normal activity with effort; some signs or symptoms of disease.  Oral Chemo Candidate: Patient is an appropriate oral chemotherapy candidate with no barriers to adherence identified.  Laboratory tests Labs through 09/2023 in Care Everywhere reviewed.  The kappa light chains remain normal.  No other signs of myeloma relapse.    Pathology Bone Marrow Examination 02/25/2022 Next Generation Flow MRD Specimen information: Nucleated cells analyzed: 88,175,416 Normal Plasma cells: 3,194 (0.0266 % total) Abnormal/clonal Plasma cells: 0 (NA % total) Mast Cells: 6,901  (0.1150 % tube 1)   Specimen adequacy: adequate for MRD testing   Expanded MRD status information: NEGATIVE at 10-5 and 10-6

## 2024-02-06 ENCOUNTER — Inpatient Hospital Stay: Attending: Hematology & Oncology

## 2024-02-06 DIAGNOSIS — C9002 Multiple myeloma in relapse: Secondary | ICD-10-CM | POA: Diagnosis present

## 2024-02-06 DIAGNOSIS — Z452 Encounter for adjustment and management of vascular access device: Secondary | ICD-10-CM | POA: Diagnosis not present

## 2024-02-20 ENCOUNTER — Inpatient Hospital Stay

## 2024-02-28 ENCOUNTER — Other Ambulatory Visit: Payer: Self-pay | Admitting: Hematology & Oncology

## 2024-03-01 ENCOUNTER — Encounter: Payer: Self-pay | Admitting: Hematology & Oncology

## 2024-04-02 ENCOUNTER — Inpatient Hospital Stay: Attending: Hematology & Oncology

## 2024-04-02 ENCOUNTER — Inpatient Hospital Stay

## 2024-04-02 ENCOUNTER — Inpatient Hospital Stay (HOSPITAL_BASED_OUTPATIENT_CLINIC_OR_DEPARTMENT_OTHER): Admitting: Hematology & Oncology

## 2024-04-02 VITALS — BP 128/65 | HR 89 | Resp 16 | Ht 72.0 in | Wt 216.8 lb

## 2024-04-02 DIAGNOSIS — E119 Type 2 diabetes mellitus without complications: Secondary | ICD-10-CM | POA: Insufficient documentation

## 2024-04-02 DIAGNOSIS — D751 Secondary polycythemia: Secondary | ICD-10-CM | POA: Insufficient documentation

## 2024-04-02 DIAGNOSIS — D801 Nonfamilial hypogammaglobulinemia: Secondary | ICD-10-CM | POA: Diagnosis not present

## 2024-04-02 DIAGNOSIS — C9002 Multiple myeloma in relapse: Secondary | ICD-10-CM | POA: Insufficient documentation

## 2024-04-02 DIAGNOSIS — Z794 Long term (current) use of insulin: Secondary | ICD-10-CM | POA: Insufficient documentation

## 2024-04-02 DIAGNOSIS — C9000 Multiple myeloma not having achieved remission: Secondary | ICD-10-CM

## 2024-04-02 DIAGNOSIS — C9001 Multiple myeloma in remission: Secondary | ICD-10-CM

## 2024-04-02 LAB — CMP (CANCER CENTER ONLY)
ALT: 26 U/L (ref 0–44)
AST: 28 U/L (ref 15–41)
Albumin: 4.1 g/dL (ref 3.5–5.0)
Alkaline Phosphatase: 69 U/L (ref 38–126)
Anion gap: 11 (ref 5–15)
BUN: 15 mg/dL (ref 8–23)
CO2: 22 mmol/L (ref 22–32)
Calcium: 8.7 mg/dL — ABNORMAL LOW (ref 8.9–10.3)
Chloride: 105 mmol/L (ref 98–111)
Creatinine: 0.96 mg/dL (ref 0.61–1.24)
GFR, Estimated: >60 mL/min (ref >60)
Glucose, Bld: 100 mg/dL — ABNORMAL HIGH (ref 70–99)
Potassium: 4.1 mmol/L (ref 3.5–5.1)
Sodium: 138 mmol/L (ref 135–145)
Total Bilirubin: 1.3 mg/dL — ABNORMAL HIGH (ref 0.0–1.2)
Total Protein: 6 g/dL — ABNORMAL LOW (ref 6.5–8.1)

## 2024-04-02 LAB — CBC WITH DIFFERENTIAL (CANCER CENTER ONLY)
Abs Immature Granulocytes: 0.02 K/uL (ref 0.00–0.07)
Basophils Absolute: 0.1 K/uL (ref 0.0–0.1)
Basophils Relative: 1 %
Eosinophils Absolute: 0.1 K/uL (ref 0.0–0.5)
Eosinophils Relative: 2 %
HCT: 52.2 % — ABNORMAL HIGH (ref 39.0–52.0)
Hemoglobin: 17.9 g/dL — ABNORMAL HIGH (ref 13.0–17.0)
Immature Granulocytes: 0 %
Lymphocytes Relative: 25 %
Lymphs Abs: 1.6 K/uL (ref 0.7–4.0)
MCH: 34.4 pg — ABNORMAL HIGH (ref 26.0–34.0)
MCHC: 34.3 g/dL (ref 30.0–36.0)
MCV: 100.4 fL — ABNORMAL HIGH (ref 80.0–100.0)
Monocytes Absolute: 0.9 K/uL (ref 0.1–1.0)
Monocytes Relative: 14 %
Neutro Abs: 3.8 K/uL (ref 1.7–7.7)
Neutrophils Relative %: 58 %
Platelet Count: 161 K/uL (ref 150–400)
RBC: 5.2 MIL/uL (ref 4.22–5.81)
RDW: 13.3 % (ref 11.5–15.5)
WBC Count: 6.5 K/uL (ref 4.0–10.5)
nRBC: 0 % (ref 0.0–0.2)

## 2024-04-02 LAB — LACTATE DEHYDROGENASE: LDH: 185 U/L (ref 98–192)

## 2024-04-02 NOTE — Patient Instructions (Signed)

## 2024-04-02 NOTE — Progress Notes (Signed)
 Hematology and Oncology Follow Up Visit  Rickey [name] [id number] Dec 26, 1940 83 y.o. 04/02/2024   Principle Diagnosis:  Kappa light chain myeloma - clinical relapse Traumatic fracture of left elbow - status post surgical repair in February 2018  Current Therapy:   Ninlaro /Pomalyst  - s/p cycle #3 - d/c due to progression Kyprolis /Pomalidomide  - s/p cycle #2 - d/c due to toxicity. Elotuzumab /Pomalidomide /decadron  -every 4 weeks- start 01/12/2018 Kyprolis /Pomalidomide /Decadron  -- s/p cycle #9 -- d/c pomaldomide in 01/2019 Xpovio  80 mg po q week -- start on 12/26/2019 - d/c on 04/2020 Daratumumab  -started cycle 1 in January 2022 CARVYKTI --given on 07/2021 [facility] IVIG --40 g Q 6 weeks --next dose in  04/17/2022   Interim History:  Mr. Mabe is back for follow-up.  He comes in with his wife.  They have now moved to an independent living situation in [place].  They really enjoyed this.  They are close to their children and grandchildren.  Surprisingly, they probably would not travel overseas anymore.  And they have been to many countries throughout the world.  They really have had a very nice lifestyle.  He is feeling okay.  He is diabetic.  He has a insulin  pump.  I will be troubled by his erythrocytosis.  His hemoglobin is quite high.  We are going to have to work this up I think.  As far as his myeloma, this really is not a problem.  He has an been out of his CAR-T therapy by 2-1/2 years.  He has had no problem with fever.  There is been no change in bowel or bladder habits.  He has had no headache.  He has had no visual changes.  Overall, I would say that his performance status is probably ECOG 1.    Medications:  Allergies as of 04/02/2024       Reactions   Ace Inhibitors Cough, Other (See Comments)   Other reaction(s): Cough, cough        Medication List        Accurate as of April 02, 2024  1:02 PM. If you have any questions, ask your nurse  or doctor.          acyclovir  400 MG tablet Commonly known as: ZOVIRAX  TAKE 1 TABLET BY MOUTH TWICE DAILY   amLODipine 5 MG tablet Commonly known as: NORVASC Take 5 mg by mouth daily.   aspirin EC 81 MG tablet daily.   atorvastatin 10 MG tablet Commonly known as: LIPITOR Take 1 tablet by mouth daily.   B-D INS SYR ULTRAFINE .5CC/30G 30G X 1/2 0.5 ML Misc Generic drug: Insulin  Syringe-Needle U-100   CALCIUM CARBONATE PO Take 220 mg by mouth 2 (two) times daily.   Cartridge Pump Misc Inject into the skin.   Dexcom G6 Sensor Misc Place 1 sensor onto the skin as directed every 10 days for continuous glucose monitoring.   fish oil-omega-3 fatty acids 1000 MG capsule Take 1 g by mouth 2 (two) times daily.   freestyle lancets   FREESTYLE LITE test strip Generic drug: glucose blood Test CBG's 3 times a day or as directed  E10.9   insulin  lispro 100 UNIT/ML injection Commonly known as: HUMALOG Inject into the skin. Insulin  pump 12/21/2018 Typically 80U daily.   irbesartan-hydrochlorothiazide 300-12.5 MG tablet Commonly known as: AVALIDE Take 1 tablet by mouth daily.   lidocaine -prilocaine  cream Commonly known as: EMLA  Apply 1 application topically as needed.   montelukast  10 MG tablet Commonly known as:  SINGULAIR  Take 1 tablet by mouth daily.   Vitamin D  50 MCG (2000 UT) Caps Take by mouth every morning.   zolpidem 10 MG tablet Commonly known as: AMBIEN Take 10 mg by mouth at bedtime as needed.        Allergies:  Allergies  Allergen Reactions   Ace Inhibitors Cough and Other (See Comments)    Other reaction(s): Cough, cough    Past Medical History, Surgical history, Social history, and Family History were reviewed and updated.  Review of Systems: Review of Systems  Constitutional: Negative.   HENT: Negative.    Eyes: Negative.   Respiratory: Negative.    Cardiovascular: Negative.   Gastrointestinal: Negative.   Genitourinary: Negative.    Musculoskeletal: Negative.   Skin: Negative.   Neurological: Negative.   Endo/Heme/Allergies: Negative.   Psychiatric/Behavioral: Negative.       Physical Exam:  height is 6' (1.829 m) and weight is 216 lb 12.8 oz (98.3 kg). His blood pressure is 128/65 and his pulse is 89. His respiration is 16 and oxygen saturation is 97%.   Wt Readings from Last 3 Encounters:  04/02/24 216 lb 12.8 oz (98.3 kg)  10/17/23 207 lb 1.3 oz (93.9 kg)  04/18/23 207 lb (93.9 kg)    Physical Exam Vitals reviewed.  HENT:     Head: Normocephalic and atraumatic.  Eyes:     Pupils: Pupils are equal, round, and reactive to light.  Cardiovascular:     Rate and Rhythm: Normal rate and regular rhythm.     Heart sounds: Normal heart sounds.  Pulmonary:     Effort: Pulmonary effort is normal.     Breath sounds: Normal breath sounds.  Abdominal:     General: Bowel sounds are normal.     Palpations: Abdomen is soft.  Musculoskeletal:        General: No tenderness or deformity. Normal range of motion.     Cervical back: Normal range of motion.  Lymphadenopathy:     Cervical: No cervical adenopathy.  Skin:    General: Skin is warm and dry.     Findings: No erythema or rash.  Neurological:     Mental Status: He is alert and oriented to person, place, and time.  Psychiatric:        Behavior: Behavior normal.        Thought Content: Thought content normal.        Judgment: Judgment normal.    Lab Results  Component Value Date   WBC 6.5 04/02/2024   HGB 17.9 (H) 04/02/2024   HCT 52.2 (H) 04/02/2024   MCV 100.4 (H) 04/02/2024   PLT 161 04/02/2024   Lab Results  Component Value Date   FERRITIN 258 12/28/2018   IRON 98 12/28/2018   TIBC 253 12/28/2018   UIBC 154 12/28/2018   IRONPCTSAT 39 12/28/2018   Lab Results  Component Value Date   RBC 5.20 04/02/2024   Lab Results  Component Value Date   KPAFRELGTCHN 7.9 10/17/2023   LAMBDASER 5.3 (L) 10/17/2023   KAPLAMBRATIO 1.49 10/17/2023    Lab Results  Component Value Date   IGGSERUM 494 (L) 10/17/2023   IGA 139 10/17/2023   IGMSERUM 27 10/17/2023   Lab Results  Component Value Date   TOTALPROTELP 5.8 (L) 06/04/2023   ALBUMINELP 3.6 06/04/2023   A1GS 0.2 06/04/2023   A2GS 0.6 06/04/2023   BETS 0.8 06/04/2023   BETA2SER 0.3 04/19/2015   GAMS 0.6 06/04/2023   MSPIKE Not Observed  06/04/2023   SPEI Comment 06/04/2023     Chemistry      Component Value Date/Time   NA 138 04/02/2024 1130   NA 142 04/23/2017 1431   NA 139 07/18/2016 1135   K 4.1 04/02/2024 1130   K 4.0 04/23/2017 1431   K 4.4 07/18/2016 1135   CL 105 04/02/2024 1130   CL 105 04/23/2017 1431   CO2 22 04/02/2024 1130   CO2 28 04/23/2017 1431   CO2 27 07/18/2016 1135   BUN 15 04/02/2024 1130   BUN 20 04/23/2017 1431   BUN 12.2 07/18/2016 1135   CREATININE 0.96 04/02/2024 1130   CREATININE 1.2 04/23/2017 1431   CREATININE 0.9 07/18/2016 1135      Component Value Date/Time   CALCIUM 8.7 (L) 04/02/2024 1130   CALCIUM 9.3 04/23/2017 1431   CALCIUM 8.9 07/18/2016 1135   ALKPHOS 69 04/02/2024 1130   ALKPHOS 64 04/23/2017 1431   ALKPHOS 58 07/18/2016 1135   AST 28 04/02/2024 1130   AST 15 07/18/2016 1135   ALT 26 04/02/2024 1130   ALT 22 04/23/2017 1431   ALT 20 07/18/2016 1135   BILITOT 1.3 (H) 04/02/2024 1130   BILITOT 1.24 (H) 07/18/2016 1135      Impression and Plan: Mr. Lindbloom is a very pleasant 83 yo caucasian gentleman with 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   Again, he had the Carvyti therapy back in March 2023.  This really has done a wonderful job for him.  For right now, we will have to see was going on with the erythrocytosis.  I will have him come back after Thanksgiving and we will run some labs.  I will then plan to see him back afterwards.    Maude JONELLE Crease, MD 11/7/20251:02 PM

## 2024-04-03 LAB — IGG, IGA, IGM
IgA: 221 mg/dL (ref 61–437)
IgG (Immunoglobin G), Serum: 423 mg/dL — ABNORMAL LOW (ref 603–1613)
IgM (Immunoglobulin M), Srm: 18 mg/dL (ref 15–143)

## 2024-04-05 ENCOUNTER — Encounter: Payer: Self-pay | Admitting: Hematology & Oncology

## 2024-04-05 LAB — KAPPA/LAMBDA LIGHT CHAINS
Kappa free light chain: 11 mg/L (ref 3.3–19.4)
Kappa, lambda light chain ratio: 1.39 (ref 0.26–1.65)
Lambda free light chains: 7.9 mg/L (ref 5.7–26.3)

## 2024-04-06 LAB — PROTEIN ELECTROPHORESIS, SERUM, WITH REFLEX
A/G Ratio: 1.7 (ref 0.7–1.7)
Albumin ELP: 3.6 g/dL (ref 2.9–4.4)
Alpha-1-Globulin: 0.2 g/dL (ref 0.0–0.4)
Alpha-2-Globulin: 0.7 g/dL (ref 0.4–1.0)
Beta Globulin: 0.9 g/dL (ref 0.7–1.3)
Gamma Globulin: 0.3 g/dL — ABNORMAL LOW (ref 0.4–1.8)
Globulin, Total: 2.1 g/dL — ABNORMAL LOW (ref 2.2–3.9)
Total Protein ELP: 5.7 g/dL — ABNORMAL LOW (ref 6.0–8.5)

## 2024-04-30 ENCOUNTER — Other Ambulatory Visit: Payer: Self-pay

## 2024-04-30 DIAGNOSIS — C9000 Multiple myeloma not having achieved remission: Secondary | ICD-10-CM

## 2024-05-03 ENCOUNTER — Inpatient Hospital Stay

## 2024-05-05 ENCOUNTER — Inpatient Hospital Stay

## 2024-05-05 ENCOUNTER — Inpatient Hospital Stay: Attending: Hematology & Oncology

## 2024-05-05 DIAGNOSIS — C9002 Multiple myeloma in relapse: Secondary | ICD-10-CM | POA: Diagnosis present

## 2024-05-05 DIAGNOSIS — D751 Secondary polycythemia: Secondary | ICD-10-CM | POA: Diagnosis not present

## 2024-05-05 DIAGNOSIS — E119 Type 2 diabetes mellitus without complications: Secondary | ICD-10-CM | POA: Diagnosis not present

## 2024-05-05 DIAGNOSIS — C9000 Multiple myeloma not having achieved remission: Secondary | ICD-10-CM

## 2024-05-05 DIAGNOSIS — D801 Nonfamilial hypogammaglobulinemia: Secondary | ICD-10-CM

## 2024-05-05 LAB — CBC WITH DIFFERENTIAL (CANCER CENTER ONLY)
Abs Immature Granulocytes: 0.03 K/uL (ref 0.00–0.07)
Basophils Absolute: 0.1 K/uL (ref 0.0–0.1)
Basophils Relative: 1 %
Eosinophils Absolute: 0.1 K/uL (ref 0.0–0.5)
Eosinophils Relative: 2 %
HCT: 51.5 % (ref 39.0–52.0)
Hemoglobin: 17.6 g/dL — ABNORMAL HIGH (ref 13.0–17.0)
Immature Granulocytes: 0 %
Lymphocytes Relative: 24 %
Lymphs Abs: 1.9 K/uL (ref 0.7–4.0)
MCH: 34.3 pg — ABNORMAL HIGH (ref 26.0–34.0)
MCHC: 34.2 g/dL (ref 30.0–36.0)
MCV: 100.4 fL — ABNORMAL HIGH (ref 80.0–100.0)
Monocytes Absolute: 1 K/uL (ref 0.1–1.0)
Monocytes Relative: 12 %
Neutro Abs: 4.8 K/uL (ref 1.7–7.7)
Neutrophils Relative %: 61 %
Platelet Count: 153 K/uL (ref 150–400)
RBC: 5.13 MIL/uL (ref 4.22–5.81)
RDW: 13.2 % (ref 11.5–15.5)
WBC Count: 7.8 K/uL (ref 4.0–10.5)
nRBC: 0 % (ref 0.0–0.2)

## 2024-05-05 LAB — CMP (CANCER CENTER ONLY)
ALT: 33 U/L (ref 0–44)
AST: 33 U/L (ref 15–41)
Albumin: 4.1 g/dL (ref 3.5–5.0)
Alkaline Phosphatase: 66 U/L (ref 38–126)
Anion gap: 11 (ref 5–15)
BUN: 17 mg/dL (ref 8–23)
CO2: 24 mmol/L (ref 22–32)
Calcium: 8.9 mg/dL (ref 8.9–10.3)
Chloride: 103 mmol/L (ref 98–111)
Creatinine: 0.96 mg/dL (ref 0.61–1.24)
GFR, Estimated: >60 mL/min (ref >60)
Glucose, Bld: 144 mg/dL — ABNORMAL HIGH (ref 70–99)
Potassium: 3.9 mmol/L (ref 3.5–5.1)
Sodium: 138 mmol/L (ref 135–145)
Total Bilirubin: 0.8 mg/dL (ref 0.0–1.2)
Total Protein: 6.1 g/dL — ABNORMAL LOW (ref 6.5–8.1)

## 2024-05-05 LAB — IRON AND IRON BINDING CAPACITY (CC-WL,HP ONLY)
Iron: 154 ug/dL (ref 45–182)
Saturation Ratios: 55 % — ABNORMAL HIGH (ref 17.9–39.5)
TIBC: 280 ug/dL (ref 250–450)
UIBC: 126 ug/dL

## 2024-05-05 LAB — VITAMIN B12: Vitamin B-12: 390 pg/mL (ref 180–914)

## 2024-05-07 LAB — ERYTHROPOIETIN: Erythropoietin: 18 m[IU]/mL (ref 2.6–18.5)

## 2024-05-18 LAB — JAK2 V617F RFX CALR/MPL/E12-15

## 2024-05-18 LAB — CALR +MPL + E12-E15  (REFLEX)

## 2024-06-16 ENCOUNTER — Encounter: Payer: Self-pay | Admitting: Hematology & Oncology

## 2024-08-27 ENCOUNTER — Inpatient Hospital Stay: Admitting: Hematology & Oncology

## 2024-08-27 ENCOUNTER — Inpatient Hospital Stay
